# Patient Record
Sex: Female | Born: 1955
Health system: Southern US, Community
[De-identification: ages and names within clinical notes are randomized; demographics above are authoritative.]

## PROBLEM LIST (undated history)

## (undated) DIAGNOSIS — T4145XA Adverse effect of unspecified anesthetic, initial encounter: Secondary | ICD-10-CM

## (undated) DIAGNOSIS — T8859XA Other complications of anesthesia, initial encounter: Secondary | ICD-10-CM

## (undated) DIAGNOSIS — Z9283 Personal history of failed moderate sedation: Secondary | ICD-10-CM

## (undated) DIAGNOSIS — M51369 Other intervertebral disc degeneration, lumbar region without mention of lumbar back pain or lower extremity pain: Secondary | ICD-10-CM

## (undated) DIAGNOSIS — G459 Transient cerebral ischemic attack, unspecified: Secondary | ICD-10-CM

## (undated) DIAGNOSIS — R079 Chest pain, unspecified: Secondary | ICD-10-CM

## (undated) DIAGNOSIS — J189 Pneumonia, unspecified organism: Secondary | ICD-10-CM

## (undated) DIAGNOSIS — Z86010 Personal history of colonic polyps: Secondary | ICD-10-CM

## (undated) DIAGNOSIS — G4730 Sleep apnea, unspecified: Secondary | ICD-10-CM

## (undated) DIAGNOSIS — F32A Depression, unspecified: Secondary | ICD-10-CM

## (undated) DIAGNOSIS — K219 Gastro-esophageal reflux disease without esophagitis: Secondary | ICD-10-CM

## (undated) DIAGNOSIS — K589 Irritable bowel syndrome without diarrhea: Secondary | ICD-10-CM

## (undated) DIAGNOSIS — I10 Essential (primary) hypertension: Secondary | ICD-10-CM

## (undated) DIAGNOSIS — F419 Anxiety disorder, unspecified: Secondary | ICD-10-CM

## (undated) DIAGNOSIS — F319 Bipolar disorder, unspecified: Secondary | ICD-10-CM

## (undated) DIAGNOSIS — R059 Cough, unspecified: Secondary | ICD-10-CM

## (undated) DIAGNOSIS — M5126 Other intervertebral disc displacement, lumbar region: Secondary | ICD-10-CM

## (undated) DIAGNOSIS — M5136 Other intervertebral disc degeneration, lumbar region: Secondary | ICD-10-CM

## (undated) DIAGNOSIS — M1990 Unspecified osteoarthritis, unspecified site: Secondary | ICD-10-CM

## (undated) DIAGNOSIS — Z8489 Family history of other specified conditions: Secondary | ICD-10-CM

## (undated) DIAGNOSIS — R05 Cough: Secondary | ICD-10-CM

## (undated) DIAGNOSIS — E039 Hypothyroidism, unspecified: Secondary | ICD-10-CM

## (undated) DIAGNOSIS — F329 Major depressive disorder, single episode, unspecified: Secondary | ICD-10-CM

## (undated) HISTORY — PX: WRIST SURGERY: SHX841

## (undated) HISTORY — PX: MASTECTOMY, PARTIAL: SHX709

## (undated) HISTORY — DX: Gastro-esophageal reflux disease without esophagitis: K21.9

## (undated) HISTORY — PX: WISDOM TOOTH EXTRACTION: SHX21

## (undated) HISTORY — PX: CHOLECYSTECTOMY: SHX55

## (undated) HISTORY — DX: Irritable bowel syndrome, unspecified: K58.9

## (undated) HISTORY — DX: Cough: R05

## (undated) HISTORY — DX: Major depressive disorder, single episode, unspecified: F32.9

## (undated) HISTORY — DX: Cough, unspecified: R05.9

## (undated) HISTORY — PX: ESOPHAGOGASTRODUODENOSCOPY (EGD) WITH ESOPHAGEAL DILATION: SHX5812

## (undated) HISTORY — DX: Anxiety disorder, unspecified: F41.9

## (undated) HISTORY — DX: Transient cerebral ischemic attack, unspecified: G45.9

## (undated) HISTORY — DX: Personal history of colonic polyps: Z86.010

## (undated) HISTORY — DX: Chest pain, unspecified: R07.9

## (undated) HISTORY — DX: Hypothyroidism, unspecified: E03.9

## (undated) HISTORY — PX: KNEE ARTHROSCOPY: SHX127

## (undated) HISTORY — PX: LAPAROSCOPY: SHX197

## (undated) HISTORY — DX: Depression, unspecified: F32.A

## (undated) HISTORY — PX: BREAST LUMPECTOMY: SHX2

## (undated) HISTORY — PX: DILATION AND CURETTAGE OF UTERUS: SHX78

## (undated) HISTORY — DX: Personal history of failed moderate sedation: Z92.83

---

## 1997-12-31 ENCOUNTER — Emergency Department (HOSPITAL_COMMUNITY): Admission: EM | Admit: 1997-12-31 | Discharge: 1997-12-31 | Payer: Self-pay | Admitting: Emergency Medicine

## 1998-03-10 ENCOUNTER — Ambulatory Visit (HOSPITAL_BASED_OUTPATIENT_CLINIC_OR_DEPARTMENT_OTHER): Admission: RE | Admit: 1998-03-10 | Discharge: 1998-03-10 | Payer: Self-pay | Admitting: Orthopedic Surgery

## 1998-08-22 ENCOUNTER — Emergency Department (HOSPITAL_COMMUNITY): Admission: EM | Admit: 1998-08-22 | Discharge: 1998-08-22 | Payer: Self-pay | Admitting: Emergency Medicine

## 1999-05-13 ENCOUNTER — Inpatient Hospital Stay (HOSPITAL_COMMUNITY): Admission: EM | Admit: 1999-05-13 | Discharge: 1999-05-18 | Payer: Self-pay | Admitting: Gastroenterology

## 1999-05-14 ENCOUNTER — Encounter: Payer: Self-pay | Admitting: Gastroenterology

## 1999-06-02 ENCOUNTER — Ambulatory Visit (HOSPITAL_COMMUNITY): Admission: RE | Admit: 1999-06-02 | Discharge: 1999-06-02 | Payer: Self-pay | Admitting: Gastroenterology

## 1999-06-02 ENCOUNTER — Encounter: Payer: Self-pay | Admitting: Gastroenterology

## 1999-06-19 ENCOUNTER — Encounter: Payer: Self-pay | Admitting: Internal Medicine

## 1999-07-03 ENCOUNTER — Encounter: Payer: Self-pay | Admitting: Internal Medicine

## 1999-07-31 ENCOUNTER — Encounter: Payer: Self-pay | Admitting: Internal Medicine

## 1999-08-04 ENCOUNTER — Encounter: Payer: Self-pay | Admitting: Gastroenterology

## 1999-08-04 ENCOUNTER — Ambulatory Visit (HOSPITAL_COMMUNITY): Admission: RE | Admit: 1999-08-04 | Discharge: 1999-08-04 | Payer: Self-pay | Admitting: Gastroenterology

## 2000-05-03 HISTORY — PX: COLONOSCOPY: SHX174

## 2001-03-13 ENCOUNTER — Emergency Department (HOSPITAL_COMMUNITY): Admission: EM | Admit: 2001-03-13 | Discharge: 2001-03-13 | Payer: Self-pay | Admitting: Emergency Medicine

## 2002-05-03 ENCOUNTER — Encounter (INDEPENDENT_AMBULATORY_CARE_PROVIDER_SITE_OTHER): Payer: Self-pay | Admitting: *Deleted

## 2002-05-03 LAB — CONVERTED CEMR LAB

## 2002-05-06 ENCOUNTER — Emergency Department (HOSPITAL_COMMUNITY): Admission: EM | Admit: 2002-05-06 | Discharge: 2002-05-06 | Payer: Self-pay | Admitting: Emergency Medicine

## 2002-05-06 ENCOUNTER — Encounter: Payer: Self-pay | Admitting: Emergency Medicine

## 2002-05-21 ENCOUNTER — Observation Stay (HOSPITAL_COMMUNITY): Admission: EM | Admit: 2002-05-21 | Discharge: 2002-05-22 | Payer: Self-pay | Admitting: Emergency Medicine

## 2002-05-21 ENCOUNTER — Encounter (INDEPENDENT_AMBULATORY_CARE_PROVIDER_SITE_OTHER): Payer: Self-pay | Admitting: Specialist

## 2002-06-05 ENCOUNTER — Other Ambulatory Visit: Admission: RE | Admit: 2002-06-05 | Discharge: 2002-06-05 | Payer: Self-pay | Admitting: Family Medicine

## 2002-06-05 ENCOUNTER — Encounter: Admission: RE | Admit: 2002-06-05 | Discharge: 2002-06-05 | Payer: Self-pay | Admitting: Family Medicine

## 2002-06-06 ENCOUNTER — Ambulatory Visit (HOSPITAL_COMMUNITY): Admission: RE | Admit: 2002-06-06 | Discharge: 2002-06-06 | Payer: Self-pay | Admitting: Surgery

## 2002-06-06 ENCOUNTER — Encounter: Payer: Self-pay | Admitting: Surgery

## 2002-06-27 ENCOUNTER — Encounter: Payer: Self-pay | Admitting: Internal Medicine

## 2002-06-28 ENCOUNTER — Encounter: Admission: RE | Admit: 2002-06-28 | Discharge: 2002-06-28 | Payer: Self-pay | Admitting: Sports Medicine

## 2002-07-04 ENCOUNTER — Encounter: Payer: Self-pay | Admitting: Gastroenterology

## 2002-07-04 ENCOUNTER — Ambulatory Visit (HOSPITAL_COMMUNITY): Admission: RE | Admit: 2002-07-04 | Discharge: 2002-07-04 | Payer: Self-pay | Admitting: Gastroenterology

## 2002-09-27 ENCOUNTER — Encounter (INDEPENDENT_AMBULATORY_CARE_PROVIDER_SITE_OTHER): Payer: Self-pay | Admitting: Gastroenterology

## 2002-09-27 ENCOUNTER — Ambulatory Visit (HOSPITAL_COMMUNITY): Admission: RE | Admit: 2002-09-27 | Discharge: 2002-09-27 | Payer: Self-pay | Admitting: Gastroenterology

## 2002-10-05 ENCOUNTER — Emergency Department (HOSPITAL_COMMUNITY): Admission: EM | Admit: 2002-10-05 | Discharge: 2002-10-05 | Payer: Self-pay | Admitting: Emergency Medicine

## 2003-05-04 HISTORY — PX: ELBOW SURGERY: SHX618

## 2004-02-26 ENCOUNTER — Ambulatory Visit (HOSPITAL_BASED_OUTPATIENT_CLINIC_OR_DEPARTMENT_OTHER): Admission: RE | Admit: 2004-02-26 | Discharge: 2004-02-26 | Payer: Self-pay | Admitting: Orthopedic Surgery

## 2004-05-22 ENCOUNTER — Ambulatory Visit: Payer: Self-pay | Admitting: Internal Medicine

## 2004-07-07 ENCOUNTER — Ambulatory Visit: Payer: Self-pay | Admitting: Internal Medicine

## 2004-07-16 ENCOUNTER — Ambulatory Visit: Payer: Self-pay | Admitting: Internal Medicine

## 2004-07-30 ENCOUNTER — Ambulatory Visit: Payer: Self-pay | Admitting: Internal Medicine

## 2004-08-03 ENCOUNTER — Ambulatory Visit: Payer: Self-pay | Admitting: Internal Medicine

## 2004-08-10 ENCOUNTER — Ambulatory Visit: Payer: Self-pay | Admitting: Internal Medicine

## 2004-08-12 ENCOUNTER — Ambulatory Visit: Payer: Self-pay

## 2004-12-17 ENCOUNTER — Ambulatory Visit: Payer: Self-pay | Admitting: Internal Medicine

## 2005-06-23 ENCOUNTER — Ambulatory Visit: Payer: Self-pay | Admitting: Internal Medicine

## 2005-06-24 ENCOUNTER — Ambulatory Visit: Payer: Self-pay | Admitting: Internal Medicine

## 2005-06-28 ENCOUNTER — Ambulatory Visit: Payer: Self-pay

## 2005-07-15 ENCOUNTER — Ambulatory Visit: Payer: Self-pay | Admitting: Internal Medicine

## 2005-07-22 ENCOUNTER — Emergency Department (HOSPITAL_COMMUNITY): Admission: EM | Admit: 2005-07-22 | Discharge: 2005-07-22 | Payer: Self-pay | Admitting: Emergency Medicine

## 2005-07-27 ENCOUNTER — Ambulatory Visit: Payer: Self-pay | Admitting: Internal Medicine

## 2005-07-29 ENCOUNTER — Ambulatory Visit (HOSPITAL_COMMUNITY): Admission: RE | Admit: 2005-07-29 | Discharge: 2005-07-29 | Payer: Self-pay | Admitting: Emergency Medicine

## 2005-08-25 ENCOUNTER — Encounter: Admission: RE | Admit: 2005-08-25 | Discharge: 2005-08-25 | Payer: Self-pay | Admitting: *Deleted

## 2005-09-08 ENCOUNTER — Ambulatory Visit: Payer: Self-pay | Admitting: Internal Medicine

## 2005-09-13 ENCOUNTER — Emergency Department (HOSPITAL_COMMUNITY): Admission: EM | Admit: 2005-09-13 | Discharge: 2005-09-14 | Payer: Self-pay | Admitting: Emergency Medicine

## 2005-09-17 ENCOUNTER — Encounter: Admission: RE | Admit: 2005-09-17 | Discharge: 2005-09-17 | Payer: Self-pay | Admitting: *Deleted

## 2005-09-24 ENCOUNTER — Ambulatory Visit: Payer: Self-pay | Admitting: Internal Medicine

## 2005-12-22 ENCOUNTER — Ambulatory Visit: Payer: Self-pay | Admitting: Internal Medicine

## 2006-03-31 ENCOUNTER — Ambulatory Visit: Payer: Self-pay | Admitting: Internal Medicine

## 2006-03-31 LAB — CONVERTED CEMR LAB
ALT: 20 units/L (ref 0–40)
AST: 21 units/L (ref 0–37)
Albumin: 3.6 g/dL (ref 3.5–5.2)
Alkaline Phosphatase: 81 units/L (ref 39–117)
BUN: 7 mg/dL (ref 6–23)
Bilirubin, Direct: 0.1 mg/dL (ref 0.0–0.3)
Creatinine, Ser: 1 mg/dL (ref 0.4–1.2)
Free T4: 0.7 ng/dL — ABNORMAL LOW (ref 0.9–1.8)
Potassium: 3.8 meq/L (ref 3.5–5.1)
TSH: 2.78 microintl units/mL (ref 0.35–5.50)
Total Bilirubin: 0.4 mg/dL (ref 0.3–1.2)
Total Protein: 6.6 g/dL (ref 6.0–8.3)

## 2006-05-13 ENCOUNTER — Encounter: Admission: RE | Admit: 2006-05-13 | Discharge: 2006-08-11 | Payer: Self-pay | Admitting: Internal Medicine

## 2006-05-18 ENCOUNTER — Ambulatory Visit: Payer: Self-pay | Admitting: Internal Medicine

## 2006-07-01 ENCOUNTER — Encounter (INDEPENDENT_AMBULATORY_CARE_PROVIDER_SITE_OTHER): Payer: Self-pay | Admitting: *Deleted

## 2006-07-05 ENCOUNTER — Ambulatory Visit: Payer: Self-pay | Admitting: Internal Medicine

## 2006-07-07 ENCOUNTER — Emergency Department (HOSPITAL_COMMUNITY): Admission: EM | Admit: 2006-07-07 | Discharge: 2006-07-07 | Payer: Self-pay | Admitting: Emergency Medicine

## 2006-08-29 ENCOUNTER — Ambulatory Visit: Payer: Self-pay | Admitting: Internal Medicine

## 2006-08-29 LAB — CONVERTED CEMR LAB
Free T4: 0.7 ng/dL (ref 0.6–1.6)
Potassium: 4.1 meq/L (ref 3.5–5.1)
T3, Free: 2.6 pg/mL (ref 2.3–4.2)
TSH: 2.16 microintl units/mL (ref 0.35–5.50)

## 2006-08-30 ENCOUNTER — Ambulatory Visit: Payer: Self-pay | Admitting: Internal Medicine

## 2006-10-10 ENCOUNTER — Ambulatory Visit (HOSPITAL_BASED_OUTPATIENT_CLINIC_OR_DEPARTMENT_OTHER): Admission: RE | Admit: 2006-10-10 | Discharge: 2006-10-10 | Payer: Self-pay | Admitting: Internal Medicine

## 2006-10-12 ENCOUNTER — Ambulatory Visit: Payer: Self-pay | Admitting: Internal Medicine

## 2006-10-15 ENCOUNTER — Ambulatory Visit: Payer: Self-pay | Admitting: Pulmonary Disease

## 2006-10-17 LAB — CONVERTED CEMR LAB
Cholesterol: 215 mg/dL (ref 0–200)
Direct LDL: 126.5 mg/dL
Glucose, Bld: 103 mg/dL — ABNORMAL HIGH (ref 70–99)
HDL: 40.8 mg/dL (ref >39.0)
Total CHOL/HDL Ratio: 5.3
Triglycerides: 142 mg/dL (ref 0–149)
VLDL: 28 mg/dL (ref 0–40)

## 2006-10-26 ENCOUNTER — Encounter: Payer: Self-pay | Admitting: Internal Medicine

## 2006-10-27 ENCOUNTER — Ambulatory Visit: Payer: Self-pay | Admitting: Internal Medicine

## 2006-10-27 DIAGNOSIS — R358 Other polyuria: Secondary | ICD-10-CM

## 2006-10-27 DIAGNOSIS — R3589 Other polyuria: Secondary | ICD-10-CM | POA: Insufficient documentation

## 2006-10-27 DIAGNOSIS — E785 Hyperlipidemia, unspecified: Secondary | ICD-10-CM | POA: Insufficient documentation

## 2006-10-27 DIAGNOSIS — R7989 Other specified abnormal findings of blood chemistry: Secondary | ICD-10-CM | POA: Insufficient documentation

## 2006-10-27 LAB — CONVERTED CEMR LAB
Bilirubin Urine: NEGATIVE
Glucose, Urine, Semiquant: NEGATIVE
Ketones, urine, test strip: NEGATIVE
Nitrite: NEGATIVE
Protein, U semiquant: NEGATIVE
Specific Gravity, Urine: <1.005
Urobilinogen, UA: NEGATIVE
WBC Urine, dipstick: NEGATIVE
pH: 5

## 2006-10-28 LAB — CONVERTED CEMR LAB: Hgb A1c MFr Bld: 5.7 % (ref 4.6–6.0)

## 2006-10-31 ENCOUNTER — Encounter (INDEPENDENT_AMBULATORY_CARE_PROVIDER_SITE_OTHER): Payer: Self-pay | Admitting: *Deleted

## 2006-12-20 ENCOUNTER — Ambulatory Visit: Payer: Self-pay | Admitting: Internal Medicine

## 2006-12-20 DIAGNOSIS — R279 Unspecified lack of coordination: Secondary | ICD-10-CM | POA: Insufficient documentation

## 2006-12-20 LAB — CONVERTED CEMR LAB
Bilirubin Urine: NEGATIVE
Blood in Urine, dipstick: NEGATIVE
Glucose, Urine, Semiquant: NEGATIVE
Ketones, urine, test strip: NEGATIVE
Nitrite: NEGATIVE
Protein, U semiquant: NEGATIVE
Specific Gravity, Urine: 1.01
Urobilinogen, UA: NEGATIVE
WBC Urine, dipstick: NEGATIVE
pH: 5

## 2006-12-22 ENCOUNTER — Encounter (INDEPENDENT_AMBULATORY_CARE_PROVIDER_SITE_OTHER): Payer: Self-pay | Admitting: *Deleted

## 2006-12-22 LAB — CONVERTED CEMR LAB
ALT: 18 units/L (ref 0–35)
AST: 16 units/L (ref 0–37)
Albumin: 4.2 g/dL (ref 3.5–5.2)
Alkaline Phosphatase: 80 units/L (ref 39–117)
Bilirubin, Direct: 0.1 mg/dL (ref 0.0–0.3)
Total Bilirubin: 0.5 mg/dL (ref 0.3–1.2)
Total Protein: 7.3 g/dL (ref 6.0–8.3)

## 2007-08-08 ENCOUNTER — Ambulatory Visit: Payer: Self-pay | Admitting: Internal Medicine

## 2007-08-10 ENCOUNTER — Telehealth (INDEPENDENT_AMBULATORY_CARE_PROVIDER_SITE_OTHER): Payer: Self-pay | Admitting: *Deleted

## 2007-08-10 ENCOUNTER — Encounter (INDEPENDENT_AMBULATORY_CARE_PROVIDER_SITE_OTHER): Payer: Self-pay | Admitting: *Deleted

## 2007-11-19 ENCOUNTER — Emergency Department (HOSPITAL_COMMUNITY): Admission: EM | Admit: 2007-11-19 | Discharge: 2007-11-19 | Payer: Self-pay | Admitting: Emergency Medicine

## 2007-11-20 ENCOUNTER — Ambulatory Visit: Payer: Self-pay | Admitting: Internal Medicine

## 2007-11-21 ENCOUNTER — Encounter (INDEPENDENT_AMBULATORY_CARE_PROVIDER_SITE_OTHER): Payer: Self-pay | Admitting: *Deleted

## 2007-11-23 ENCOUNTER — Encounter: Payer: Self-pay | Admitting: Internal Medicine

## 2007-12-01 ENCOUNTER — Encounter: Payer: Self-pay | Admitting: Internal Medicine

## 2007-12-22 ENCOUNTER — Encounter: Payer: Self-pay | Admitting: Internal Medicine

## 2008-01-30 ENCOUNTER — Encounter: Payer: Self-pay | Admitting: Internal Medicine

## 2008-03-02 ENCOUNTER — Telehealth: Payer: Self-pay | Admitting: Family Medicine

## 2008-03-05 ENCOUNTER — Encounter (INDEPENDENT_AMBULATORY_CARE_PROVIDER_SITE_OTHER): Payer: Self-pay | Admitting: *Deleted

## 2008-03-05 ENCOUNTER — Ambulatory Visit: Payer: Self-pay | Admitting: Family Medicine

## 2008-03-06 ENCOUNTER — Encounter (INDEPENDENT_AMBULATORY_CARE_PROVIDER_SITE_OTHER): Payer: Self-pay | Admitting: *Deleted

## 2008-03-06 ENCOUNTER — Telehealth (INDEPENDENT_AMBULATORY_CARE_PROVIDER_SITE_OTHER): Payer: Self-pay | Admitting: *Deleted

## 2008-03-07 ENCOUNTER — Encounter: Payer: Self-pay | Admitting: Internal Medicine

## 2008-03-11 ENCOUNTER — Telehealth (INDEPENDENT_AMBULATORY_CARE_PROVIDER_SITE_OTHER): Payer: Self-pay | Admitting: *Deleted

## 2008-03-12 ENCOUNTER — Encounter (INDEPENDENT_AMBULATORY_CARE_PROVIDER_SITE_OTHER): Payer: Self-pay | Admitting: *Deleted

## 2008-03-14 ENCOUNTER — Ambulatory Visit: Payer: Self-pay | Admitting: Internal Medicine

## 2008-03-15 ENCOUNTER — Encounter (INDEPENDENT_AMBULATORY_CARE_PROVIDER_SITE_OTHER): Payer: Self-pay | Admitting: *Deleted

## 2008-03-15 LAB — CONVERTED CEMR LAB
BUN: 8 mg/dL (ref 6–23)
Creatinine, Ser: 1 mg/dL (ref 0.4–1.2)
Hgb A1c MFr Bld: 5.8 % (ref 4.6–6.0)
Potassium: 3.9 meq/L (ref 3.5–5.1)
TSH: 2.93 microintl units/mL (ref 0.35–5.50)

## 2008-05-15 ENCOUNTER — Encounter: Payer: Self-pay | Admitting: Internal Medicine

## 2008-06-19 ENCOUNTER — Encounter: Payer: Self-pay | Admitting: Internal Medicine

## 2008-06-27 ENCOUNTER — Encounter: Admission: RE | Admit: 2008-06-27 | Discharge: 2008-06-27 | Payer: Self-pay | Admitting: Obstetrics and Gynecology

## 2008-09-16 ENCOUNTER — Telehealth (INDEPENDENT_AMBULATORY_CARE_PROVIDER_SITE_OTHER): Payer: Self-pay | Admitting: *Deleted

## 2008-09-16 ENCOUNTER — Ambulatory Visit: Payer: Self-pay | Admitting: Family Medicine

## 2008-10-09 ENCOUNTER — Telehealth (INDEPENDENT_AMBULATORY_CARE_PROVIDER_SITE_OTHER): Payer: Self-pay | Admitting: *Deleted

## 2008-10-14 ENCOUNTER — Ambulatory Visit: Payer: Self-pay | Admitting: Internal Medicine

## 2008-10-14 DIAGNOSIS — M545 Low back pain, unspecified: Secondary | ICD-10-CM | POA: Insufficient documentation

## 2008-10-14 DIAGNOSIS — R109 Unspecified abdominal pain: Secondary | ICD-10-CM | POA: Insufficient documentation

## 2008-10-14 LAB — CONVERTED CEMR LAB
Bilirubin Urine: NEGATIVE
Blood in Urine, dipstick: NEGATIVE
Glucose, Urine, Semiquant: NEGATIVE
Ketones, urine, test strip: NEGATIVE
Nitrite: NEGATIVE
Protein, U semiquant: NEGATIVE
Specific Gravity, Urine: <1.005
Urobilinogen, UA: 0.2
pH: 7

## 2008-10-15 ENCOUNTER — Telehealth (INDEPENDENT_AMBULATORY_CARE_PROVIDER_SITE_OTHER): Payer: Self-pay | Admitting: *Deleted

## 2008-10-15 ENCOUNTER — Encounter (INDEPENDENT_AMBULATORY_CARE_PROVIDER_SITE_OTHER): Payer: Self-pay | Admitting: *Deleted

## 2008-10-16 ENCOUNTER — Telehealth (INDEPENDENT_AMBULATORY_CARE_PROVIDER_SITE_OTHER): Payer: Self-pay | Admitting: *Deleted

## 2008-10-16 ENCOUNTER — Encounter (INDEPENDENT_AMBULATORY_CARE_PROVIDER_SITE_OTHER): Payer: Self-pay | Admitting: *Deleted

## 2008-10-16 LAB — CONVERTED CEMR LAB
ALT: 64 units/L — ABNORMAL HIGH (ref 0–35)
AST: 61 units/L — ABNORMAL HIGH (ref 0–37)
Albumin: 4 g/dL (ref 3.5–5.2)
Alkaline Phosphatase: 99 units/L (ref 39–117)
Amylase: 66 units/L (ref 27–131)
Basophils Absolute: 0 10*3/uL (ref 0.0–0.1)
Basophils Relative: 0.6 % (ref 0.0–3.0)
Bilirubin, Direct: 0.1 mg/dL (ref 0.0–0.3)
Eosinophils Absolute: 0.1 10*3/uL (ref 0.0–0.7)
Eosinophils Relative: 4 % (ref 0.0–5.0)
H Pylori IgG: NEGATIVE
HCT: 40.4 % (ref 36.0–46.0)
Hemoglobin: 13.7 g/dL (ref 12.0–15.0)
Lipase: 22 units/L (ref 11.0–59.0)
Lymphocytes Relative: 32.2 % (ref 12.0–46.0)
Lymphs Abs: 1 10*3/uL (ref 0.7–4.0)
MCHC: 33.8 g/dL (ref 30.0–36.0)
MCV: 94.4 fL (ref 78.0–100.0)
Monocytes Absolute: 0.2 10*3/uL (ref 0.1–1.0)
Monocytes Relative: 5.3 % (ref 3.0–12.0)
Neutro Abs: 1.8 10*3/uL (ref 1.4–7.7)
Neutrophils Relative %: 57.9 % (ref 43.0–77.0)
Platelets: 180 10*3/uL (ref 150.0–400.0)
RBC: 4.28 M/uL (ref 3.87–5.11)
RDW: 12.8 % (ref 11.5–14.6)
Total Bilirubin: 0.5 mg/dL (ref 0.3–1.2)
Total Protein: 7.3 g/dL (ref 6.0–8.3)
WBC: 3.1 10*3/uL — ABNORMAL LOW (ref 4.5–10.5)

## 2008-10-17 ENCOUNTER — Telehealth (INDEPENDENT_AMBULATORY_CARE_PROVIDER_SITE_OTHER): Payer: Self-pay | Admitting: *Deleted

## 2008-10-17 ENCOUNTER — Ambulatory Visit: Payer: Self-pay | Admitting: Internal Medicine

## 2008-10-17 LAB — CONVERTED CEMR LAB
OCCULT 1: NEGATIVE
OCCULT 2: NEGATIVE
OCCULT 3: NEGATIVE

## 2008-10-18 ENCOUNTER — Encounter (INDEPENDENT_AMBULATORY_CARE_PROVIDER_SITE_OTHER): Payer: Self-pay | Admitting: *Deleted

## 2008-10-18 ENCOUNTER — Telehealth (INDEPENDENT_AMBULATORY_CARE_PROVIDER_SITE_OTHER): Payer: Self-pay | Admitting: *Deleted

## 2008-10-21 ENCOUNTER — Telehealth: Payer: Self-pay | Admitting: Internal Medicine

## 2008-10-22 ENCOUNTER — Ambulatory Visit: Payer: Self-pay | Admitting: Internal Medicine

## 2008-10-22 LAB — CONVERTED CEMR LAB
ALT: 35 units/L (ref 0–35)
AST: 29 units/L (ref 0–37)
Albumin: 3.8 g/dL (ref 3.5–5.2)
Alkaline Phosphatase: 88 units/L (ref 39–117)
Bilirubin, Direct: 0 mg/dL (ref 0.0–0.3)
Total Bilirubin: 0.7 mg/dL (ref 0.3–1.2)
Total Protein: 7 g/dL (ref 6.0–8.3)

## 2008-10-23 ENCOUNTER — Encounter (INDEPENDENT_AMBULATORY_CARE_PROVIDER_SITE_OTHER): Payer: Self-pay | Admitting: *Deleted

## 2008-10-24 ENCOUNTER — Telehealth: Payer: Self-pay | Admitting: Internal Medicine

## 2008-10-27 ENCOUNTER — Emergency Department (HOSPITAL_BASED_OUTPATIENT_CLINIC_OR_DEPARTMENT_OTHER): Admission: EM | Admit: 2008-10-27 | Discharge: 2008-10-27 | Payer: Self-pay | Admitting: Emergency Medicine

## 2008-10-27 ENCOUNTER — Ambulatory Visit: Payer: Self-pay | Admitting: Diagnostic Radiology

## 2008-10-28 ENCOUNTER — Ambulatory Visit: Payer: Self-pay | Admitting: Family Medicine

## 2008-10-28 DIAGNOSIS — I889 Nonspecific lymphadenitis, unspecified: Secondary | ICD-10-CM | POA: Insufficient documentation

## 2008-10-30 ENCOUNTER — Emergency Department (HOSPITAL_COMMUNITY): Admission: EM | Admit: 2008-10-30 | Discharge: 2008-10-30 | Payer: Self-pay | Admitting: Emergency Medicine

## 2008-11-05 ENCOUNTER — Encounter: Payer: Self-pay | Admitting: Internal Medicine

## 2008-11-07 ENCOUNTER — Telehealth (INDEPENDENT_AMBULATORY_CARE_PROVIDER_SITE_OTHER): Payer: Self-pay | Admitting: *Deleted

## 2008-11-11 ENCOUNTER — Telehealth (INDEPENDENT_AMBULATORY_CARE_PROVIDER_SITE_OTHER): Payer: Self-pay | Admitting: *Deleted

## 2008-11-11 ENCOUNTER — Telehealth: Payer: Self-pay | Admitting: Internal Medicine

## 2008-11-19 ENCOUNTER — Encounter: Admission: RE | Admit: 2008-11-19 | Discharge: 2008-12-09 | Payer: Self-pay | Admitting: Internal Medicine

## 2008-11-20 ENCOUNTER — Encounter: Payer: Self-pay | Admitting: Internal Medicine

## 2008-11-26 ENCOUNTER — Telehealth: Payer: Self-pay | Admitting: Internal Medicine

## 2008-11-26 ENCOUNTER — Encounter: Payer: Self-pay | Admitting: Internal Medicine

## 2008-12-04 ENCOUNTER — Encounter: Payer: Self-pay | Admitting: Internal Medicine

## 2008-12-10 ENCOUNTER — Encounter: Payer: Self-pay | Admitting: Internal Medicine

## 2009-01-13 ENCOUNTER — Ambulatory Visit (HOSPITAL_BASED_OUTPATIENT_CLINIC_OR_DEPARTMENT_OTHER): Admission: RE | Admit: 2009-01-13 | Discharge: 2009-01-14 | Payer: Self-pay | Admitting: Urology

## 2009-01-13 HISTORY — PX: CYSTOCELE REPAIR: SHX163

## 2009-01-31 ENCOUNTER — Ambulatory Visit: Payer: Self-pay | Admitting: Internal Medicine

## 2009-02-07 ENCOUNTER — Encounter: Admission: RE | Admit: 2009-02-07 | Discharge: 2009-02-07 | Payer: Self-pay | Admitting: Obstetrics and Gynecology

## 2009-02-10 ENCOUNTER — Ambulatory Visit: Payer: Self-pay | Admitting: Internal Medicine

## 2009-02-10 DIAGNOSIS — Z9283 Personal history of failed moderate sedation: Secondary | ICD-10-CM | POA: Insufficient documentation

## 2009-02-25 ENCOUNTER — Encounter (INDEPENDENT_AMBULATORY_CARE_PROVIDER_SITE_OTHER): Payer: Self-pay | Admitting: General Surgery

## 2009-02-25 ENCOUNTER — Ambulatory Visit (HOSPITAL_BASED_OUTPATIENT_CLINIC_OR_DEPARTMENT_OTHER): Admission: RE | Admit: 2009-02-25 | Discharge: 2009-02-25 | Payer: Self-pay | Admitting: General Surgery

## 2009-03-05 ENCOUNTER — Emergency Department (HOSPITAL_BASED_OUTPATIENT_CLINIC_OR_DEPARTMENT_OTHER): Admission: EM | Admit: 2009-03-05 | Discharge: 2009-03-05 | Payer: Self-pay | Admitting: Emergency Medicine

## 2009-03-17 ENCOUNTER — Telehealth: Payer: Self-pay | Admitting: Internal Medicine

## 2009-03-20 ENCOUNTER — Encounter: Payer: Self-pay | Admitting: Internal Medicine

## 2009-04-10 ENCOUNTER — Encounter: Payer: Self-pay | Admitting: Internal Medicine

## 2009-05-20 ENCOUNTER — Ambulatory Visit: Payer: Self-pay | Admitting: Internal Medicine

## 2009-05-20 DIAGNOSIS — E876 Hypokalemia: Secondary | ICD-10-CM | POA: Insufficient documentation

## 2009-05-20 DIAGNOSIS — R5383 Other fatigue: Secondary | ICD-10-CM

## 2009-05-20 DIAGNOSIS — R5381 Other malaise: Secondary | ICD-10-CM | POA: Insufficient documentation

## 2009-05-21 ENCOUNTER — Encounter (INDEPENDENT_AMBULATORY_CARE_PROVIDER_SITE_OTHER): Payer: Self-pay | Admitting: *Deleted

## 2009-05-21 LAB — CONVERTED CEMR LAB
BUN: 12 mg/dL (ref 6–23)
Basophils Absolute: 0 10*3/uL (ref 0.0–0.1)
Basophils Relative: 0.8 % (ref 0.0–3.0)
Creatinine, Ser: 1.1 mg/dL (ref 0.4–1.2)
Eosinophils Absolute: 0.2 10*3/uL (ref 0.0–0.7)
Eosinophils Relative: 3.8 % (ref 0.0–5.0)
Free T4: 0.8 ng/dL (ref 0.6–1.6)
HCT: 40.4 % (ref 36.0–46.0)
Hemoglobin: 13.4 g/dL (ref 12.0–15.0)
Lymphocytes Relative: 35.5 % (ref 12.0–46.0)
Lymphs Abs: 1.7 10*3/uL (ref 0.7–4.0)
MCHC: 33.1 g/dL (ref 30.0–36.0)
MCV: 95.1 fL (ref 78.0–100.0)
Monocytes Absolute: 0.4 10*3/uL (ref 0.1–1.0)
Monocytes Relative: 7.5 % (ref 3.0–12.0)
Neutro Abs: 2.4 10*3/uL (ref 1.4–7.7)
Neutrophils Relative %: 52.4 % (ref 43.0–77.0)
Platelets: 197 10*3/uL (ref 150.0–400.0)
Potassium: 4.7 meq/L (ref 3.5–5.1)
RBC: 4.25 M/uL (ref 3.87–5.11)
RDW: 12.7 % (ref 11.5–14.6)
T3, Free: 3.1 pg/mL (ref 2.3–4.2)
TSH: 2.8 microintl units/mL (ref 0.35–5.50)
WBC: 4.7 10*3/uL (ref 4.5–10.5)

## 2009-06-02 ENCOUNTER — Telehealth: Payer: Self-pay | Admitting: Internal Medicine

## 2009-06-17 ENCOUNTER — Ambulatory Visit: Payer: Self-pay | Admitting: Internal Medicine

## 2009-06-18 ENCOUNTER — Telehealth (INDEPENDENT_AMBULATORY_CARE_PROVIDER_SITE_OTHER): Payer: Self-pay | Admitting: *Deleted

## 2009-06-21 ENCOUNTER — Emergency Department (HOSPITAL_COMMUNITY): Admission: EM | Admit: 2009-06-21 | Discharge: 2009-06-21 | Payer: Self-pay | Admitting: Emergency Medicine

## 2009-10-23 ENCOUNTER — Ambulatory Visit: Payer: Self-pay | Admitting: Internal Medicine

## 2009-10-23 DIAGNOSIS — F319 Bipolar disorder, unspecified: Secondary | ICD-10-CM | POA: Insufficient documentation

## 2009-10-24 LAB — CONVERTED CEMR LAB
ALT: 18 units/L (ref 0–35)
AST: 20 units/L (ref 0–37)
Albumin: 4.3 g/dL (ref 3.5–5.2)
Alkaline Phosphatase: 89 units/L (ref 39–117)
BUN: 15 mg/dL (ref 6–23)
Basophils Absolute: 0 10*3/uL (ref 0.0–0.1)
Basophils Relative: 0.5 % (ref 0.0–3.0)
Bilirubin, Direct: 0.1 mg/dL (ref 0.0–0.3)
CO2: 29 meq/L (ref 19–32)
Calcium: 9.4 mg/dL (ref 8.4–10.5)
Chloride: 104 meq/L (ref 96–112)
Cholesterol: 197 mg/dL (ref 0–200)
Creatinine, Ser: 1.1 mg/dL (ref 0.4–1.2)
Eosinophils Absolute: 0.1 10*3/uL (ref 0.0–0.7)
Eosinophils Relative: 2.2 % (ref 0.0–5.0)
GFR calc non Af Amer: 55.5 mL/min (ref >60)
Glucose, Bld: 80 mg/dL (ref 70–99)
HCT: 39.7 % (ref 36.0–46.0)
HDL: 55.9 mg/dL (ref >39.00)
Hemoglobin: 13.6 g/dL (ref 12.0–15.0)
Hgb A1c MFr Bld: 5.7 % (ref 4.6–6.5)
LDL Cholesterol: 119 mg/dL — ABNORMAL HIGH (ref 0–99)
Lymphocytes Relative: 28.5 % (ref 12.0–46.0)
Lymphs Abs: 1.6 10*3/uL (ref 0.7–4.0)
MCHC: 34.4 g/dL (ref 30.0–36.0)
MCV: 94.1 fL (ref 78.0–100.0)
Monocytes Absolute: 0.5 10*3/uL (ref 0.1–1.0)
Monocytes Relative: 8.1 % (ref 3.0–12.0)
Neutro Abs: 3.4 10*3/uL (ref 1.4–7.7)
Neutrophils Relative %: 60.7 % (ref 43.0–77.0)
Platelets: 187 10*3/uL (ref 150.0–400.0)
Potassium: 4.5 meq/L (ref 3.5–5.1)
RBC: 4.22 M/uL (ref 3.87–5.11)
RDW: 14 % (ref 11.5–14.6)
Sodium: 143 meq/L (ref 135–145)
TSH: 2.69 microintl units/mL (ref 0.35–5.50)
Total Bilirubin: 0.5 mg/dL (ref 0.3–1.2)
Total CHOL/HDL Ratio: 4
Total Protein: 7.5 g/dL (ref 6.0–8.3)
Triglycerides: 112 mg/dL (ref 0.0–149.0)
VLDL: 22.4 mg/dL (ref 0.0–40.0)
WBC: 5.7 10*3/uL (ref 4.5–10.5)

## 2009-11-24 ENCOUNTER — Ambulatory Visit: Payer: Self-pay | Admitting: Internal Medicine

## 2009-11-24 DIAGNOSIS — R198 Other specified symptoms and signs involving the digestive system and abdomen: Secondary | ICD-10-CM | POA: Insufficient documentation

## 2009-11-24 LAB — CONVERTED CEMR LAB
Cholesterol, target level: 200 mg/dL
HDL goal, serum: 40 mg/dL
LDL Goal: 160 mg/dL

## 2009-11-25 ENCOUNTER — Telehealth (INDEPENDENT_AMBULATORY_CARE_PROVIDER_SITE_OTHER): Payer: Self-pay | Admitting: *Deleted

## 2009-11-25 ENCOUNTER — Emergency Department (HOSPITAL_COMMUNITY): Admission: EM | Admit: 2009-11-25 | Discharge: 2009-11-25 | Payer: Self-pay | Admitting: Emergency Medicine

## 2009-11-27 LAB — CONVERTED CEMR LAB
Folate: 3.9 ng/mL
Iron: 84 ug/dL (ref 42–145)
Saturation Ratios: 29.3 % (ref 20.0–50.0)
Sed Rate: 19 mm/hr (ref 0–22)
Transferrin: 204.8 mg/dL — ABNORMAL LOW (ref 212.0–360.0)
Vitamin B-12: 183 pg/mL — ABNORMAL LOW (ref 211–911)

## 2009-12-03 ENCOUNTER — Ambulatory Visit: Payer: Self-pay | Admitting: Diagnostic Radiology

## 2009-12-03 ENCOUNTER — Emergency Department (HOSPITAL_BASED_OUTPATIENT_CLINIC_OR_DEPARTMENT_OTHER): Admission: EM | Admit: 2009-12-03 | Discharge: 2009-12-03 | Payer: Self-pay | Admitting: Emergency Medicine

## 2010-02-17 ENCOUNTER — Ambulatory Visit: Payer: Self-pay | Admitting: Internal Medicine

## 2010-02-17 DIAGNOSIS — R946 Abnormal results of thyroid function studies: Secondary | ICD-10-CM | POA: Insufficient documentation

## 2010-02-17 DIAGNOSIS — E538 Deficiency of other specified B group vitamins: Secondary | ICD-10-CM | POA: Insufficient documentation

## 2010-02-23 LAB — CONVERTED CEMR LAB
Potassium: 4.4 meq/L (ref 3.5–5.1)
TSH: 1.52 microintl units/mL (ref 0.35–5.50)

## 2010-02-24 ENCOUNTER — Ambulatory Visit: Payer: Self-pay | Admitting: Internal Medicine

## 2010-04-11 LAB — HM MAMMOGRAPHY

## 2010-04-11 LAB — HM PAP SMEAR

## 2010-05-14 ENCOUNTER — Ambulatory Visit
Admission: RE | Admit: 2010-05-14 | Discharge: 2010-05-14 | Payer: Self-pay | Source: Home / Self Care | Attending: Internal Medicine | Admitting: Internal Medicine

## 2010-05-14 ENCOUNTER — Encounter (INDEPENDENT_AMBULATORY_CARE_PROVIDER_SITE_OTHER): Payer: Self-pay | Admitting: *Deleted

## 2010-05-14 ENCOUNTER — Other Ambulatory Visit: Payer: Self-pay | Admitting: Internal Medicine

## 2010-05-14 DIAGNOSIS — R443 Hallucinations, unspecified: Secondary | ICD-10-CM | POA: Insufficient documentation

## 2010-05-14 DIAGNOSIS — R51 Headache: Secondary | ICD-10-CM | POA: Insufficient documentation

## 2010-05-14 DIAGNOSIS — R519 Headache, unspecified: Secondary | ICD-10-CM | POA: Insufficient documentation

## 2010-05-15 LAB — BASIC METABOLIC PANEL
BUN: 11 mg/dL (ref 6–23)
CO2: 29 mEq/L (ref 19–32)
Calcium: 9.2 mg/dL (ref 8.4–10.5)
Chloride: 103 mEq/L (ref 96–112)
Creatinine, Ser: 1.2 mg/dL (ref 0.4–1.2)
GFR: 50.09 mL/min — ABNORMAL LOW (ref >60.00)
Glucose, Bld: 86 mg/dL (ref 70–99)
Potassium: 3.6 mEq/L (ref 3.5–5.1)
Sodium: 139 mEq/L (ref 135–145)

## 2010-05-15 LAB — CBC WITH DIFFERENTIAL/PLATELET
Basophils Absolute: 0 10*3/uL (ref 0.0–0.1)
Basophils Relative: 0.2 % (ref 0.0–3.0)
Eosinophils Absolute: 0.1 10*3/uL (ref 0.0–0.7)
Eosinophils Relative: 3.8 % (ref 0.0–5.0)
HCT: 41.6 % (ref 36.0–46.0)
Hemoglobin: 13.8 g/dL (ref 12.0–15.0)
Lymphocytes Relative: 44.7 % (ref 12.0–46.0)
Lymphs Abs: 1.7 10*3/uL (ref 0.7–4.0)
MCHC: 33.2 g/dL (ref 30.0–36.0)
MCV: 94.4 fl (ref 78.0–100.0)
Monocytes Absolute: 0.2 10*3/uL (ref 0.1–1.0)
Monocytes Relative: 6 % (ref 3.0–12.0)
Neutro Abs: 1.8 10*3/uL (ref 1.4–7.7)
Neutrophils Relative %: 45.3 % (ref 43.0–77.0)
Platelets: 176 10*3/uL (ref 150.0–400.0)
RBC: 4.41 Mil/uL (ref 3.87–5.11)
RDW: 13.7 % (ref 11.5–14.6)
WBC: 3.9 10*3/uL — ABNORMAL LOW (ref 4.5–10.5)

## 2010-05-15 LAB — HEPATIC FUNCTION PANEL
ALT: 24 U/L (ref 0–35)
AST: 19 U/L (ref 0–37)
Albumin: 4.1 g/dL (ref 3.5–5.2)
Alkaline Phosphatase: 91 U/L (ref 39–117)
Bilirubin, Direct: 0.1 mg/dL (ref 0.0–0.3)
Total Bilirubin: 0.6 mg/dL (ref 0.3–1.2)
Total Protein: 7.2 g/dL (ref 6.0–8.3)

## 2010-05-15 LAB — SEDIMENTATION RATE: Sed Rate: 14 mm/hr (ref 0–22)

## 2010-05-15 LAB — MAGNESIUM: Magnesium: 1.9 mg/dL (ref 1.5–2.5)

## 2010-05-15 LAB — TSH: TSH: 0.76 u[IU]/mL (ref 0.35–5.50)

## 2010-05-15 LAB — VITAMIN B12: Vitamin B-12: 526 pg/mL (ref 211–911)

## 2010-05-21 ENCOUNTER — Telehealth: Payer: Self-pay | Admitting: Internal Medicine

## 2010-05-25 ENCOUNTER — Encounter: Payer: Self-pay | Admitting: Obstetrics and Gynecology

## 2010-05-25 ENCOUNTER — Telehealth: Payer: Self-pay | Admitting: Internal Medicine

## 2010-06-04 NOTE — Letter (Signed)
Summary: Out of Work  [facility] [employer]  [address] [place], [place] [address]   Phone: [phone]  Fax: [phone]    March 05, 2008   Employee:  Sarah Salazar    To Whom It May Concern:   For Medical reasons, please excuse the above named employee from work for the following dates:  Start:   March 01, 2008  End:   March 05, 2008, may return to work on March 06, 2008  If you need additional information, please feel free to contact our office.         Sincerely,    Loreen Freud, DO

## 2010-06-04 NOTE — Assessment & Plan Note (Signed)
Summary: FOR WEIGHT LOSS//PH   Vital Signs:  Patient profile:   55 year old female Weight:      182.2 pounds Pulse rate:   72 / minute Resp:     15 per minute BP sitting:   126 / 78  (left arm) Cuff size:   large  Vitals Entered By: Shonna Chock (May 20, 2009 8:00 AM) CC: 1.Weight concerns  2.) Refill: Spironolacton,Kdur,Ranitidine, and Hycosamine Comments REVIEWED MED LIST, PATIENT AGREED DOSE AND INSTRUCTION CORRECT    Primary Care Provider:  Marga Melnick, MD   CC:  1.Weight concerns  2.) Refill: Spironolacton, Kdur, Ranitidine, and and Hycosamine.  History of Present Illness: [employer]  providing frozen  meals; she maintains she is restricting hyperglycemic carbs . Walking in building 3X/day. 12 # weight gain since 01/2009.  Allergies: 1)  ! Amoxicillin 2)  ! * Fentanyl 3)  ! Nubain 4)  ! Effexor 5)  ! Percodan 6)  ! Percocet 7)  ! Hydrocodone 8)  ! Sulfa 9)  ! Versed 10)  ! * Tessalon Perls 11)  ! * Steri Strips  Review of Systems General:  Complains of fatigue. ENT:  Denies difficulty swallowing and hoarseness; see GI evaluation. CV:  Denies palpitations. GI:  Complains of constipation and diarrhea; Alternating ; Endo & colonoscopy planned. Derm:  Denies changes in nail beds and dryness; hair breaking off. Neuro:  Denies numbness and tingling. Endo:  Complains of cold intolerance; denies heat intolerance.  Physical Exam  General:  well-nourished,in no acute distress; alert,appropriate and cooperative throughout examination Eyes:  No corneal or conjunctival inflammation noted.No lid lag.Perrla. Neck:  No deformities, masses, or tenderness noted. Heart:  Normal rate and regular rhythm. S1 and S2 normal without gallop, murmur, click, rub. S4 with slight slurring Neurologic:  DTRs symmetrical and normal.  No tremor Skin:  Intact without suspicious lesions or rashes Psych:  memory intact for recent and remote, normally interactive, and not  depressed appearing.     Impression & Recommendations:  Problem # 1:  FATIGUE (ICD-780.79)  Orders: Venipuncture (45409) TLB-CBC Platelet - w/Differential (85025-CBCD) TLB-TSH (Thyroid Stimulating Hormone) (84443-TSH) TLB-T4 (Thyrox), Free [phone]) TLB-T3, Free (Triiodothyronine) (84481-T3FREE) TLB-Creatinine, Blood (82565-CREA) TLB-Potassium (K+) (84132-K) TLB-BUN (Urea Nitrogen) (84520-BUN)  Problem # 2:  HYPOKALEMIA (ICD-276.8)  PMH of  Orders: Venipuncture (95621) TLB-Creatinine, Blood (82565-CREA) TLB-Potassium (K+) (84132-K) TLB-BUN (Urea Nitrogen) (84520-BUN)  Complete Medication List: 1)  Klor-con M20 20 Meq Tbcr (Potassium chloride crys cr) .... 2 tabs m,w,f,sun and 3 tabs t,th,sat 2)  Lamictal 150 Mg Tabs (Lamotrigine) .... 2 tabs qhs 3)  Wellbutrin Xl 300 Mg Tb24 (Bupropion hcl) .Marland Kitchen.. 1 by mouth qam 4)  Vistaril 50 Mg Caps (Hydroxyzine pamoate) .Marland Kitchen.. 1-2 tabs tid 5)  Spironolactone 25 Mg Tabs (Spironolactone) .Marland Kitchen.. 1 once daily as needed edema 6)  Ibuprofen 800 Mg Tabs (Ibuprofen) .Marland Kitchen.. 1 tab by mouth q8 hours 7)  Hyoscyamine Sulfate 0.125 Mg Subl (Hyoscyamine sulfate) .... Dissolve 1-2 every 4 hours as needed 8)  Ranitidine Hcl 150 Mg Tabs (Ranitidine hcl) .Marland Kitchen.. 1 two times a day 9)  Propoxyphene N-apap 100-650 Mg Tabs (Propoxyphene n-apap) .Marland Kitchen.. 1 -2 q 6 hrs prn 10)  Darvocet-n 100 100-650 Mg Tabs (Propoxyphene n-apap) .... Take one tablet every 4-6 hours as needed for pain 11)  Miralax Powd (Polyethylene glycol 3350) .... Take 1 dose by mouth two times a day 12)  Cvs Stool Softener 100 Mg Caps (Docusate sodium) .Marland Kitchen.. 1 qd 13)  Dulcolax 5  Mg Tbec (Bisacodyl) .... Take 1-2 tablets by mouth as needed for constipation  Patient Instructions: 1)  Consume LESS THAN 25 grams of sugar / day from LABELED foods & drinks. 2)  Check your Blood Pressure regularly. If it is above:135/85 ON AVERAGE  you should make an appointment.

## 2010-06-04 NOTE — Progress Notes (Signed)
Summary: Refill Request  Phone Note Refill Request Message from:  Patient  Refills Requested: Medication #1:  HYOSCYAMINE SULFATE 0.125 MG SUBL dissolve 1-2 every 4 hours as needed [facility] Requested: Electronic Initial call taken by: Shonna Chock,  June 02, 2009 5:04 PM  Follow-up for Phone Call        Dr.Noelly Lasseigne we never rx'ed this med, patient asked for a refill on this at her last OV and it was never filled please ok and indicate dispense number Follow-up by: Shonna Chock,  June 02, 2009 5:06 PM    Prescriptions: HYOSCYAMINE SULFATE 0.125 MG SUBL (HYOSCYAMINE SULFATE) dissolve 1-2 every 4 hours as needed  #30 x 2   Entered and Authorized by:   Marga Melnick MD   Signed by:   Marga Melnick MD on 06/02/2009   Method used:   Faxed to ...       [facility] [address] [id number]* (retail)       [address]       [place], [place]  [address]       Ph: [phone]       Fax: [phone]   RxID:   [id number]

## 2010-06-04 NOTE — Progress Notes (Signed)
Summary: Back Concerns  Phone Note Call from Patient Call back at Home Phone [phone]   Caller: Patient Summary of Call: MESSAGE LEFT ON VOICEMAIL: PATIENT STILL WITH BACK PAIN, PATIENT WOULD LIKE TO KNOW WHAT TO DO SINCE SHE IS LIMITED TO MEDS THAT SHE CAN TAKE DUE TO ELEVATED LFT'S.   DR.HOPPER PLEASE FUTHER ADVISE.Sarah Salazar  October 21, 2008 3:27 PM   Follow-up for Phone Call        refer to PT Follow-up by: Marga Melnick MD,  October 21, 2008 3:58 PM

## 2010-06-04 NOTE — Progress Notes (Signed)
Summary: ? about headaches  Phone Note Call from Patient Call back at Home Phone [phone]   Caller: Patient Reason for Call: Talk to Doctor Summary of Call: Patient called and LM on the triage VM stating that she had seen Dr. Alwyn Ren yesterday. He had asked her if she had woken up with any headaches and she had told him no. But upon talking to her boyfriend about it he had reminded her that she had woken up with severe headaches about 2-3 weeks ago over the weekend of the 4th while in [place] with him. She wanted to know if this would make any difference. Please adivse.  Initial call taken by: Harold Barban,  November 25, 2009 1:03 PM  Follow-up for Phone Call        Per Hopp: No, but keep headache diary. Concern is any frequent am headaches upon awakening.  Follow-up by: Harold Barban,  November 25, 2009 3:47 PM  Additional Follow-up for Phone Call Additional follow up Details #1::        Left detailed message with information and our call back number for anymore questions or concerns.  Additional Follow-up by: Harold Barban,  November 25, 2009 3:47 PM

## 2010-06-04 NOTE — Progress Notes (Signed)
Summary: ONGOING PAIN  Phone Note Call from Patient Call back at Home Phone [phone]   Caller: Patient Summary of Call: PATIENT CALLED AND IS VERY UPSET AND FEELS OUT OF OPTIONS. PATIENT SAID IT SEEMS AS IF NOBODY WANTS TO HELP HER. PATIENT WITH PAIN . Marland Kitchen  Marland KitchenTAKING HYDROCODONE AND DARVOCET WHICH ONLY GIVES TEMPORARY RELIEF. PATIENT WAS REFERRED FOR PT FOR BACK PAIN. . .WAS TOLD AN ORDER/PLAN OF ACTION WAS SENT TO [place] WITH NO REPLY FOR HER TO GET ADDITIONAL THERAPY ON [facility]. PATIENT IS HURTHING ALL OVER AND WOULD LIKE TO KNOW WHAT TO DO. PATIENT NOT SCHEDULED TO SEE Ignacia Marvel AUG [phone]  Shonna Chock  November 26, 2008 12:13 PM   Follow-up for Phone Call        I reviewed Dr Blenda Bridegroom 11/08/08 OV; he recommended Physical Therapy . This was originally scheduled for 6/24 but cancelled by [facility]. This can be rescheduled if she desires Follow-up by: Marga Melnick MD,  November 26, 2008 1:24 PM  Additional Follow-up for Phone Call Additional follow up Details #1::        I HAVE RE-REFERRED PT BACK TO [facility].  THEY WILL CONTACT PT TO SET UP AN INITIAL APPOINTMENT. Additional Follow-up by: Magdalen Spatz [facility],  November 26, 2008 1:46 PM

## 2010-06-04 NOTE — Assessment & Plan Note (Signed)
Summary: hospital follow up   Vital Signs:  Patient profile:   55 year old female Height:      64 inches Weight:      166.50 pounds BMI:     28.68 Temp:     97.9 degrees F oral Pulse rate:   74 / minute Resp:     14 per minute BP sitting:   130 / 80  (left arm)  Vitals Entered By: [name] (October 28, 2008 11:20 AM) CC: hospital follow up Is Patient Diabetic? No Pain Assessment Patient in pain? no       Have you ever been in a relationship where you felt threatened, hurt or afraid?No   Does patient need assistance? Functional Status Self care Ambulation Normal   Primary Care Radhika Dershem:  Laury Axon  CC:  hospital follow up.  History of Present Illness: Constant dull "toothache" type pain @ center of LS spine ; no radiation. Some response to pain meds 10/26/08. Exacerbation 6/27 after 3 hrs of [place] in  [place]. Position change of no benefit. She feels as if constipated , but stools loose to watery X3, last  6/26. Seen in ER 6/27 ,record reviewed:HCT 39.9;GFR 48;mild lymphadenitis on LS spine films. [place]  Allergies: 1)  ! Amoxicillin 2)  ! * Fentanyl 3)  ! Nubain 4)  ! Effexor 5)  ! Percodan 6)  ! Percocet 7)  ! Hydrocodone 8)  ! Sulfa 9)  ! Versed 10)  ! Sondra Come  Past History:  Past Surgical History: Cholecystectomy - 06/13/2002, D&C x 3 , knee surgery x 2 -, l. wrist surgery x 3 -, laparoscopy - 06/03/1990, lumpectomy l. breast - 06/03/1978; Endo X2 : neg; Colonoscopy negative X 2  Review of Systems General:  Denies chills, fever, and sweats. GI:  See HPI; Complains of change in bowel habits; denies bloody stools and dark tarry stools. GU:  Denies discharge, dysuria, hematuria, and incontinence.  Physical Exam  General:  well-nourished,in no acute distress; alert,appropriate and cooperative throughout examination Abdomen:  Bowel sounds positive,abdomen soft but mildly tender  mid abdomen  without masses, organomegaly or  hernias noted. Extremities:  No clubbing, cyanosis, edema, or deformity noted with normal full range of motion of all joints.   Neg SLR to 90 degrees; sat up w/o help Neurologic:  strength normal in lower  extremities, gait normal, and DTRs symmetrical and normal.   Skin:  Intact without suspicious lesions or rashes Cervical Nodes:  No lymphadenopathy noted Axillary Nodes:  No palpable lymphadenopathy   Impression & Recommendations:  Problem # 1:  LOW BACK PAIN, ACUTE (ICD-724.2)  Her updated medication list for this problem includes:    Ibuprofen 800 Mg Tabs (Ibuprofen) .Marland Kitchen... 1 tab by mouth q8 hours    Propoxyphene N-apap 100-650 Mg Tabs (Propoxyphene n-apap) .Marland Kitchen... 1 -2 q 6 hrs prn    Darvocet-n 100 100-650 Mg Tabs (Propoxyphene n-apap) .Marland Kitchen... Take one tablet every 4-6 hours as needed for pain  Problem # 2:  LYMPHADENITIS (ICD-289.3)  On LS spine films  Orders: T-Culture, Giardia / Cryptosporidium (09811-91478) T-Culture, C-Diff Toxin A/B (29562-13086) T-Culture, Stool (87045/[id number])  Problem # 3:  DIARRHEA (ICD-787.91)  Orders: T-Culture, Giardia / Cryptosporidium (57846-96295) T-Culture, C-Diff Toxin A/B (28413-24401) T-Culture, Stool (87045/[id number])  Complete Medication List: 1)  Klor-con M20 20 Meq Tbcr (Potassium chloride crys cr) .... 2 tabs m,w,f,sun and 3 tabs t,th,sat 2)  Lamictal 150 Mg Tabs (Lamotrigine) .... 2 tabs qhs  3)  Wellbutrin Xl 300 Mg Tb24 (Bupropion hcl) .Marland Kitchen.. 1 by mouth qam 4)  Vistaril 50 Mg Caps (Hydroxyzine pamoate) .Marland Kitchen.. 1-2 tabs tid 5)  Spironolactone 25 Mg Tabs (Spironolactone) .Marland Kitchen.. 1 once daily as needed edema 6)  Ibuprofen 800 Mg Tabs (Ibuprofen) .Marland Kitchen.. 1 tab by mouth q8 hours 7)  Hyoscyamine Sulfate 0.125 Mg Subl (Hyoscyamine sulfate) .Marland Kitchen.. 1 sl every 4 hours as needed 8)  Ranitidine Hcl 150 Mg Tabs (Ranitidine hcl) .Marland Kitchen.. 1 two times a day 9)  Nitrofurantoin Monohyd Macro 100 Mg Caps (Nitrofurantoin monohyd macro) .Marland Kitchen.. 1 two times a day  with  8 oz water 10)  Propoxyphene N-apap 100-650 Mg Tabs (Propoxyphene n-apap) .Marland Kitchen.. 1 -2 q 6 hrs prn 11)  Darvocet-n 100 100-650 Mg Tabs (Propoxyphene n-apap) .... Take one tablet every 4-6 hours as needed for pain 12)  Metronidazole 250 Mg Tabs (Metronidazole) .Marland Kitchen.. 1 three times a day  Patient Instructions: 1)  Align 1 once daily until bowels normal. Prescriptions: METRONIDAZOLE 250 MG TABS (METRONIDAZOLE) 1 three times a day  #21 x 0   Entered and Authorized by:   Marga Melnick MD   Signed by:   Marga Melnick MD on 10/28/2008   Method used:   Print then Give to Patient   RxID:   [id number]

## 2010-06-04 NOTE — Letter (Signed)
Summary: [facility]  [facility]   Imported By: Lanelle Bal 11/12/2008 08:48:50  _____________________________________________________________________  External Attachment:    Type:   Image     Comment:   External Document

## 2010-06-04 NOTE — Progress Notes (Signed)
Summary: addl labs-dr hopper  Phone Note Call from Patient   Summary of Call: patient called bac to schedule labs  BUN,creat,& K+ (276.8)  -(scheduled 111209)she wanted to know if  --a1c& thyroid could be added Initial call taken by: Okey Regal [place],  March 11, 2008 1:58 PM  Follow-up for Phone Call        790.6 A1C, TSH

## 2010-06-04 NOTE — Progress Notes (Signed)
Summary: ****REFILL REQUEST**** hycosamine//hop see please  Phone Note Refill Request Call back at Work Phone [phone]   pt left message on machine that she needs refill on med pt [facility]. left message to call  office to verify which med she need refill on...........Marland KitchenFelecia Deloach CMA  October 09, 2008 11:34 AM  Initial call taken by: Kandice Hams,  October 10, 2008 9:07 AM  Follow-up for Phone Call        MESSAGE TAKEN OFF FELICIA'S VOICEMAIL TODAY, PATIENT WOULD LIKE A REFILL ON HYCOSAMINE SENT TO [facility]. PATIENT WILL BE AVALIABLE UNTIL 10:30AM TODAY IF ANY QUESTIONS Follow-up by: Shonna Chock,  October 10, 2008 8:34 AM  Additional Follow-up for Phone Call Additional follow up Details #1::        pt called back she said she takes this med every so often HYOSCYAMINE 0.125MG  as needed  pt says the kind you put under hyour tongue.I looked in pt chart med was given back in 2007 last  signs are starting back stomach contractions would lijke a rx [facility] [address] Please call and leave a message either way if this is going to be done Additional Follow-up by: Kandice Hams,  October 10, 2008 9:06 AM    Additional Follow-up for Phone Call Additional follow up Details #2::    OK # 30 , 1 SL q 4 hrs as needed  Follow-up by: Marga Melnick MD,  October 10, 2008 12:54 PM  Additional Follow-up for Phone Call Additional follow up Details #3:: Details for Additional Follow-up Action Taken: rx faxed and pt aware .Kandice Hams  October 10, 2008 3:06 PM  Additional Follow-up by: Kandice Hams,  October 10, 2008 3:06 PM  New/Updated Medications: HYOSCYAMINE SULFATE 0.125 MG SUBL (HYOSCYAMINE SULFATE) 1 sl every 4 hours as needed   Prescriptions: HYOSCYAMINE SULFATE 0.125 MG SUBL (HYOSCYAMINE SULFATE) 1 sl every 4 hours as needed  #30 x 0   Entered by:   Kandice Hams   Authorized by:   Marga Melnick MD   Signed by:   Kandice Hams on 10/10/2008   Method used:   Faxed to ...  [facility] [address] [id number]* (retail)       [address]       [place], [place]  [address]       Ph: [phone]       Fax: [phone]   RxID:   [id number]

## 2010-06-04 NOTE — Progress Notes (Signed)
Summary: klorcon refill  Phone Note Refill Request Message from:  Pharmacy [facility] [address]  Refills Requested: Medication #1:  KLOR-CON M20 20 MEQ TBCR 2 tabs m Initial call taken by: Kandice Hams,  Sep 16, 2008 11:01 AM      Prescriptions: KLOR-CON M20 20 MEQ TBCR (POTASSIUM CHLORIDE CRYS CR) 2 tabs m,w,f,sun and 3 tabs t,th,sat  #70 Tablet x 7   Entered by:   Kandice Hams   Authorized by:   Marga Melnick MD   Signed by:   Kandice Hams on 09/16/2008   Method used:   Telephoned to ...       [facility] [id number]* (retail)       [address]       [place], [place]  [address]       Ph: [phone]       Fax: [phone]   RxID:   [id number]

## 2010-06-04 NOTE — Letter (Signed)
Summary: [facility]  [facility]   Imported By: Lanelle Bal 12/18/2008 09:57:38  _____________________________________________________________________  External Attachment:    Type:   Image     Comment:   External Document

## 2010-06-04 NOTE — Letter (Signed)
Summary: Results Follow up Letter  [facility]  [address] [place], [place] [address]   Phone: [phone]  Fax: [phone]    12/22/2006 MRN: [id number]  BLYTHE VEACH [address] [place], [place]  [address]  Dear [name],  The following are the results of your recent test(s):  Test         Result    Pap Smear:        Normal _____  Not Normal _____ Comments: ______________________________________________________ Cholesterol: LDL(Bad cholesterol):         Your goal is less than:         HDL (Good cholesterol):       Your goal is more than: Comments:  ______________________________________________________ Mammogram:        Normal _____  Not Normal _____ Comments:  ___________________________________________________________________ Hemoccult:        Normal _____  Not normal _______ Comments:    _____________________________________________________________________ Other Tests:  Please see attached results and comments   We routinely do not discuss normal results over the telephone.  If you desire a copy of the results, or you have any questions about this information we can discuss them at your next office visit.   Sincerely,

## 2010-06-04 NOTE — Assessment & Plan Note (Signed)
Summary: flu shot/alr  Nurse Visit  Flu Vaccine Consent Questions     Do you have a history of severe allergic reactions to this vaccine? no    Any prior history of allergic reactions to egg and/or gelatin? no    Do you have a sensitivity to the preservative Thimersol? no    Do you have a past history of Guillan-Barre Syndrome? no    Do you currently have an acute febrile illness? no    Have you ever had a severe reaction to latex? no    Vaccine information given and explained to patient? yes    Are you currently pregnant? no    Lot Number:[id number]   Exp Date:10/30/2009   Site Given  Left Deltoid IM    Allergies: 1)  ! Amoxicillin 2)  ! * Fentanyl 3)  ! Nubain 4)  ! Effexor 5)  ! Percodan 6)  ! Percocet 7)  ! Hydrocodone 8)  ! Sulfa 9)  ! Versed 10)  ! * Tessalon Perls  Orders Added: 1)  Admin 1st Vaccine [90471] 2)  Flu Vaccine 76yrs + [84166]

## 2010-06-04 NOTE — Assessment & Plan Note (Signed)
Summary: COUGH, TROUBLE AT WORK WITH PERFUME, SNIFFLES///SPH   Vital Signs:  Patient profile:   55 year old female Weight:      173.6 pounds BMI:     29.91 Temp:     98.5 degrees F oral Pulse rate:   76 / minute Resp:     17 per minute BP sitting:   142 / 98  (left arm) Cuff size:   large  Vitals Entered By: Shonna Chock CMA (February 24, 2010 8:07 AM) CC: Cough and certain smells cause patient to have headaches   Primary Care Keagan Brislin:  Marga Melnick, MD   CC:  Cough and certain smells cause patient to have headaches.  History of Present Illness:  Cough      This is a 55 year old woman who presents with Cough for past week.  The patient reports non-productive cough and fever to 101.5, but denies pleuritic chest pain, wheezing, and exertional dyspnea.  The patient denies the following symptoms: cold/URI symptoms, sore throat, and nasal congestion. Partially effective prior treatments have included OTC cough medication( Dayquil & Robitussin) .  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  Recent labs reviewed & discussed.TSH is therapeutic.  Current Medications (verified): 1)  Klor-Con M20 20 Meq Tbcr (Potassium Chloride Crys Cr) .... 2 Tabs Once Daily Except 3 Pills T & Sat 2)  Lamictal 150 Mg  Tabs (Lamotrigine) .... 2 Tabs Qhs 3)  Wellbutrin Xl 150 Mg Xr24h-Tab (Bupropion Hcl) .... 3 By Mouth Once Daily 4)  Vistaril 50 Mg  Caps (Hydroxyzine Pamoate) .Marland Kitchen.. 1-2 Tabs Tid 5)  Ibuprofen 800 Mg Tabs (Ibuprofen) .Marland Kitchen.. 1 Tab By Mouth Q8 Hours 6)  Hyoscyamine Sulfate 0.125 Mg Subl (Hyoscyamine Sulfate) .... Dissolve 1-2 Every 4 Hours As Needed 7)  Ranitidine Hcl 150 Mg Tabs (Ranitidine Hcl) .Marland Kitchen.. 1 Two Times A Day 8)  Miralax   Powd (Polyethylene Glycol 3350) .... Take 1 Dose By Mouth Two Times A Day 9)  Cvs Stool Softener 100 Mg Caps (Docusate Sodium) .Marland Kitchen.. 1 Qd 10)  Dulcolax 5 Mg Tbec (Bisacodyl) .... Take 1-2 Tablets By Mouth As Needed For  Constipation 11)  Xanax (? Dose) .... As Needed  Allergies: 1)  ! Amoxicillin 2)  ! * Fentanyl 3)  ! Nubain 4)  ! Effexor 5)  ! Percodan 6)  ! Percocet 7)  ! Hydrocodone 8)  ! Sulfa 9)  ! Versed 10)  ! * Tessalon Perls 11)  ! * Steri Strips  Review of Systems ENT:  No persistent frontal headache, facial  pain or purulence. Allergy:  Denies itching eyes and sneezing.  Physical Exam  General:  in no acute distress; alert,appropriate and cooperative throughout examination Ears:  External ear exam shows no significant lesions or deformities.  Otoscopic examination reveals clear canals, tympanic membranes are intact bilaterally without bulging, retraction, inflammation or discharge. Hearing is grossly normal bilaterally.TMs scarred Nose:  External nasal examination shows no deformity or inflammation. Nasal mucosa are pink and moist without lesions or exudates. Mouth:  Oral mucosa and oropharynx without lesions or exudates.  No pharyngeal erythema.   Lungs:  Normal respiratory effort, chest expands symmetrically. Lungs are clear to auscultation, no crackles or wheezes. but dry cough Heart:  Normal rate and regular rhythm. S1 and S2 normal without gallop, murmur, click, rub.S4 Cervical Nodes:  No lymphadenopathy noted Axillary Nodes:  No palpable lymphadenopathy   Impression & Recommendations:  Problem # 1:  BRONCHITIS-ACUTE (ICD-466.0)  RAD component suggested by history   Her updated medication list for this problem includes:    Azithromycin 250 Mg Tabs (Azithromycin) .Marland Kitchen... As per pack    Qvar 80 Mcg/act Aers (Beclomethasone dipropionate) .Marland Kitchen... 1-2 puffs every 12 hrs ; gargle & spit after use  Problem # 2:  HYPOTHYROIDISM (ICD-244.9) corrected The following medications were removed from the medication list:    Synthroid 100 Mcg Tabs (Levothyroxine sodium) .Marland Kitchen... 1 by mouth once daily Her updated medication list for this problem includes:    Synthroid 100 Mcg Tabs  (Levothyroxine sodium) .Marland Kitchen... 1 once daily  Complete Medication List: 1)  Klor-con M20 20 Meq Tbcr (Potassium chloride crys cr) .... 2 tabs once daily except 3 pills t & sat 2)  Lamictal 150 Mg Tabs (Lamotrigine) .... 2 tabs qhs 3)  Wellbutrin Xl 150 Mg Xr24h-tab (Bupropion hcl) .... 3 by mouth once daily 4)  Vistaril 50 Mg Caps (Hydroxyzine pamoate) .Marland Kitchen.. 1-2 tabs tid 5)  Ibuprofen 800 Mg Tabs (Ibuprofen) .Marland Kitchen.. 1 tab by mouth q8 hours 6)  Hyoscyamine Sulfate 0.125 Mg Subl (Hyoscyamine sulfate) .... Dissolve 1-2 every 4 hours as needed 7)  Ranitidine Hcl 150 Mg Tabs (Ranitidine hcl) .Marland Kitchen.. 1 two times a day 8)  Miralax Powd (Polyethylene glycol 3350) .... Take 1 dose by mouth two times a day 9)  Cvs Stool Softener 100 Mg Caps (Docusate sodium) .Marland Kitchen.. 1 qd 10)  Dulcolax 5 Mg Tbec (Bisacodyl) .... Take 1-2 tablets by mouth as needed for constipation 11)  Xanax (? Dose)  .... As needed 12)  Synthroid 100 Mcg Tabs (Levothyroxine sodium) .Marland Kitchen.. 1 once daily 13)  Azithromycin 250 Mg Tabs (Azithromycin) .... As per pack 14)  Qvar 80 Mcg/act Aers (Beclomethasone dipropionate) .Marland Kitchen.. 1-2 puffs every 12 hrs ; gargle & spit after use  Patient Instructions: 1)  Drink as much  NON dairy fluid as you can tolerate for the next few days. Prescriptions: QVAR 80 MCG/ACT AERS (BECLOMETHASONE DIPROPIONATE) 1-2 puffs every 12 hrs ; gargle & spit after use  #1 x 0   Entered and Authorized by:   Marga Melnick MD   Signed by:   Marga Melnick MD on 02/24/2010   Method used:   Samples Given   RxID:   [id number] AZITHROMYCIN 250 MG TABS (AZITHROMYCIN) as per pack  #1 x 0   Entered and Authorized by:   Marga Melnick MD   Signed by:   Marga Melnick MD on 02/24/2010   Method used:   Print then Give to Patient   RxID:   [id number] SYNTHROID 100 MCG TABS (LEVOTHYROXINE SODIUM) 1 once daily  #90 x 3   Entered and Authorized by:   Marga Melnick MD   Signed by:   Marga Melnick MD on 02/24/2010   Method used:    Print then Give to Patient   RxID:   [id number]    Orders Added: 1)  Est. Patient Level III [01027]

## 2010-06-04 NOTE — Assessment & Plan Note (Signed)
Summary: ABOUT NEW THYROID MED//PH   Vital Signs:  Patient profile:   55 year old female Weight:      172.4 pounds BMI:     29.70 Pulse rate:   72 / minute Resp:     15 per minute BP sitting:   134 / 98  (left arm) Cuff size:   large  Vitals Entered By: Shonna Chock CMA (February 17, 2010 8:02 AM) CC: Patient was on thyroid medication 4-5 years and restarted it on her own about a month ago due to extreme fatigue. Patient states she feels much better since restarting med, patient has also noticed a decrease in hair loss that she was experiencing   Primary Care Namya Voges:  Marga Melnick, MD   CC:  Patient was on thyroid medication 4-5 years and restarted it on her own about a month ago due to extreme fatigue. Patient states she feels much better since restarting med and patient has also noticed a decrease in hair loss that she was experiencing.  History of Present Illness: Labs reviewed ; HCT was 39.7 & MCV was WNL , but B12 was 183( normal > 211). Now on 1300 units of B12 once daily by mouth . TSH was 2.69 in 10/2009 on no meds;she found old (? 55 years old) Synthroid 100 micrograms which  she has taken X 1 month.She has lost 18# (?) on this dose; her chronic fatigue is dramatically better.  Current Medications (verified): 1)  Klor-Con M20 20 Meq Tbcr (Potassium Chloride Crys Cr) .... 2 Tabs Once Daily Except 3 Pills T & Sat 2)  Lamictal 150 Mg  Tabs (Lamotrigine) .... 2 Tabs Qhs 3)  Wellbutrin Xl 150 Mg Xr24h-Tab (Bupropion Hcl) .... 3 By Mouth Once Daily 4)  Vistaril 50 Mg  Caps (Hydroxyzine Pamoate) .Marland Kitchen.. 1-2 Tabs Tid 5)  Ibuprofen 800 Mg Tabs (Ibuprofen) .Marland Kitchen.. 1 Tab By Mouth Q8 Hours 6)  Hyoscyamine Sulfate 0.125 Mg Subl (Hyoscyamine Sulfate) .... Dissolve 1-2 Every 4 Hours As Needed 7)  Ranitidine Hcl 150 Mg Tabs (Ranitidine Hcl) .Marland Kitchen.. 1 Two Times A Day 8)  Miralax   Powd (Polyethylene Glycol 3350) .... Take 1 Dose By Mouth Two Times A Day 9)  Cvs Stool Softener 100 Mg Caps (Docusate  Sodium) .Marland Kitchen.. 1 Qd 10)  Dulcolax 5 Mg Tbec (Bisacodyl) .... Take 1-2 Tablets By Mouth As Needed For Constipation 11)  Xanax (? Dose) .... As Needed 12)  Synthroid 100 Mcg Tabs (Levothyroxine Sodium) .Marland Kitchen.. 1 By Mouth Once Daily  Allergies: 1)  ! Amoxicillin 2)  ! * Fentanyl 3)  ! Nubain 4)  ! Effexor 5)  ! Percodan 6)  ! Percocet 7)  ! Hydrocodone 8)  ! Sulfa 9)  ! Versed 10)  ! * Tessalon Perls 11)  ! * Steri Strips  Review of Systems CV:  Occasional palpitations. GI:  Denies constipation and diarrhea. Derm:  Denies changes in nail beds and dryness; Hair loss improved . Neuro:  Denies numbness and tingling. Endo:  Complains of heat intolerance; denies cold intolerance.  Physical Exam  General:  well-nourished; alert,appropriate and cooperative throughout examination Eyes:  No corneal or conjunctival inflammation noted.No lid lag Neck:  No deformities, masses, or tenderness noted. Heart:  Normal rate and regular rhythm. S1 and S2 normal without gallop, murmur, click, rub or other extra sounds. Neurologic:  alert & oriented X3 and DTRs symmetrical and normal.   No tremor   Impression & Recommendations:  Problem # 1:  NONSPECIFIC  ABNORM RESULTS THYROID FUNCT STUDY (ICD-794.5) Note: on 6 yr oldSynthroid 100 micrograms once daily X 1 month Orders: Venipuncture (65784) TLB-TSH (Thyroid Stimulating Hormone) (84443-TSH)  Problem # 2:  HYPOKALEMIA (ICD-276.8)  Orders: Venipuncture (69629) TLB-Potassium (K+) (84132-K)  Problem # 3:  B12 DEFICIENCY (ICD-266.2) w/o anemia & w/o increased MCV  Complete Medication List: 1)  Klor-con M20 20 Meq Tbcr (Potassium chloride crys cr) .... 2 tabs once daily except 3 pills t & sat 2)  Lamictal 150 Mg Tabs (Lamotrigine) .... 2 tabs qhs 3)  Wellbutrin Xl 150 Mg Xr24h-tab (Bupropion hcl) .... 3 by mouth once daily 4)  Vistaril 50 Mg Caps (Hydroxyzine pamoate) .Marland Kitchen.. 1-2 tabs tid 5)  Ibuprofen 800 Mg Tabs (Ibuprofen) .Marland Kitchen.. 1 tab by mouth q8  hours 6)  Hyoscyamine Sulfate 0.125 Mg Subl (Hyoscyamine sulfate) .... Dissolve 1-2 every 4 hours as needed 7)  Ranitidine Hcl 150 Mg Tabs (Ranitidine hcl) .Marland Kitchen.. 1 two times a day 8)  Miralax Powd (Polyethylene glycol 3350) .... Take 1 dose by mouth two times a day 9)  Cvs Stool Softener 100 Mg Caps (Docusate sodium) .Marland Kitchen.. 1 qd 10)  Dulcolax 5 Mg Tbec (Bisacodyl) .... Take 1-2 tablets by mouth as needed for constipation 11)  Xanax (? Dose)  .... As needed 12)  Synthroid 100 Mcg Tabs (Levothyroxine sodium) .Marland Kitchen.. 1 by mouth once daily  Patient Instructions: 1)  Please schedule a follow-up Lab  appointment in 2 months for TSH , B12 level..   Orders Added: 1)  Est. Patient Level III [52841] 2)  Venipuncture [32440] 3)  TLB-TSH (Thyroid Stimulating Hormone) [84443-TSH] 4)  TLB-Potassium (K+) [10272-Z]

## 2010-06-04 NOTE — Progress Notes (Signed)
Summary: HA appt  ---- Converted from flag ---- ---- 05/25/2010 2:40 PM, Magdalen Spatz [facility] wrote: Tresa Endo,  Per [facility], the referral 1st goes to their insurance dept for benefits info, the to scheduling.  They will contact pt to schedule, then fax [place] the appt date & time.  They state this is normally a 1-2 week turn-around time.  I did inform them to please rush.  Renee ------------------------------  Noted. Lucious Groves CMA  May 25, 2010 4:13 PM

## 2010-06-04 NOTE — Letter (Signed)
Summary: External Correspondence-POLYSOMNOGRAM  External Correspondence-POLYSOMNOGRAM   Imported By: Freddy Jaksch 11/30/2006 15:53:54  _____________________________________________________________________  External Attachment:    Type:   Image     Comment:   [facility]

## 2010-06-04 NOTE — Letter (Signed)
Summary: Results Follow up Letter  [place] [facility] [place]  [address] [place], [place] [address]   Phone: [phone]  Fax: [phone]    05/21/2009 MRN: [id number]  [name] [address] [place], [place]  [address]  Dear [name],  The following are the results of your recent test(s):  Test         Result    Pap Smear:        Normal _____  Not Normal _____ Comments: ______________________________________________________ Cholesterol: LDL(Bad cholesterol):         Your goal is less than:         HDL (Good cholesterol):       Your goal is more than: Comments:  ______________________________________________________ Mammogram:        Normal _____  Not Normal _____ Comments:  ___________________________________________________________________ Hemoccult:        Normal _____  Not normal _______ Comments:    _____________________________________________________________________ Other Tests: Please see attached labs done on 05/20/2009    We routinely do not discuss normal results over the telephone.  If you desire a copy of the results, or you have any questions about this information we can discuss them at your next office visit.   Sincerely,

## 2010-06-04 NOTE — Progress Notes (Signed)
Summary: PT REFERRAL  Phone Note Outgoing Call   Call placed by: Magdalen Spatz [facility],  November 11, 2008 9:46 AM Call placed to: Specialist Summary of Call: FYI...............Marland KitchenREFERENCE PT REFERRAL.  PER [facility] ON [address], PT WAS SCH'D FOR 10-24-08 @ 8AM & CXLED & DID NOT RSC.  i s/w pt who says she went to ER & to dr Blenda Bridegroom office for her pain, she has not decided whether she stll wants this referral or not, if so she will call & let us know. Initial call taken by: Magdalen Spatz [facility]  November 11, 2008 9:46 AM  Follow-up for Phone Call        noted Follow-up by: Marga Melnick MD,  November 11, 2008 10:24 AM

## 2010-06-04 NOTE — Letter (Signed)
Summary: Results Follow up Letter  [place] [facility] [place]  [address] [place], [place] [address]   Phone: [phone]  Fax: [phone]    03/15/2008 MRN: [id number]  KATHLEEN TAMM [address] [place], [place]  [address]  Dear [name],  The following are the results of your recent test(s):  Test         Result    Pap Smear:        Normal _____  Not Normal _____ Comments: ______________________________________________________ Cholesterol: LDL(Bad cholesterol):         Your goal is less than:         HDL (Good cholesterol):       Your goal is more than: Comments:  ______________________________________________________ Mammogram:        Normal _____  Not Normal _____ Comments:  ___________________________________________________________________ Hemoccult:        Normal _____  Not normal _______ Comments:    _____________________________________________________________________ Other Tests: PLEASE SEE ATTACHED LABS DONE ON 03/14/2008    We routinely do not discuss normal results over the telephone.  If you desire a copy of the results, or you have any questions about this information we can discuss them at your next office visit.   Sincerely,

## 2010-06-04 NOTE — Letter (Signed)
Summary: Results Follow up Letter  [place] [facility] [place]  [address] [place], [place] [address]   Phone: [phone]  Fax: [phone]    10/23/2008 MRN: [id number]  Sarah Salazar [address] [place], [place]  [address]  Dear [name],  The following are the results of your recent test(s):  Test         Result    Pap Smear:        Normal _____  Not Normal _____ Comments: ______________________________________________________ Cholesterol: LDL(Bad cholesterol):         Your goal is less than:         HDL (Good cholesterol):       Your goal is more than: Comments:  ______________________________________________________ Mammogram:        Normal _____  Not Normal _____ Comments:  ___________________________________________________________________ Hemoccult:        Normal _____  Not normal _______ Comments:    _____________________________________________________________________ Other Tests: PLEASE SEE ATTACHED LABS DONE ON 10/22/2008    We routinely do not discuss normal results over the telephone.  If you desire a copy of the results, or you have any questions about this information we can discuss them at your next office visit.   Sincerely,

## 2010-06-04 NOTE — Progress Notes (Signed)
Summary: [facility]   Imported By: Sherian Rein 02/12/2009 10:14:53  _____________________________________________________________________  External Attachment:    Type:   Image     Comment:   External Document
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
## 2010-06-04 NOTE — Letter (Signed)
Summary: [facility]  [facility]   Imported By: Lanelle Bal 12/18/2008 09:54:35  _____________________________________________________________________  External Attachment:    Type:   Image     Comment:   External Document

## 2010-06-04 NOTE — Consult Note (Signed)
Summary: [facility]  [facility]   Imported By: Lanelle Bal 01/02/2008 12:14:22  _____________________________________________________________________  External Attachment:    Type:   Image     Comment:   External Document

## 2010-06-04 NOTE — Progress Notes (Addendum)
Summary: HA's   Phone Note Call from Patient Call back at Work Phone [phone]   Caller: Patient Summary of Call: Pt called and states that she feels that her HA's are stressed releated. Her medication does not help unless she is not at work. She states she is now having a new symptom of feeling like a stabbing feeling in (R) eye. Does she need to see a Neuro at this point? She states she has only gone 48 hours without a HA. Please advise. Army Fossa CMA  May 21, 2010 12:53 PM   Follow-up for Phone Call        see referral Follow-up by: Marga Melnick MD,  May 21, 2010 2:51 PM  Additional Follow-up for Phone Call Additional follow up Details #1::        pt is calling again because she hasnt received a call back from yesterday. I told pt that a referral had been  put in for her and she will be contacted with the info. Pt states " So what do I do now; Ive already missed 2 days of work". Please advise. Additional Follow-up by: Lavell Islam,  May 22, 2010 11:27 AM    Additional Follow-up for Phone Call Additional follow up Details #2::    Based on her observation that stress is the  major trigger for her headaches ; she should ask Dr Tomasa Rand to   possibly give her temporary work absence until stress can be addressed & controlled. Clinically medication adjustment by Dr Tomasa Rand is needed. [facility] can work with him to prevent adverse medication interactions Follow-up by: Marga Melnick MD,  May 22, 2010 4:09 PM  Additional Follow-up for Phone Call Additional follow up Details #3:: Details for Additional Follow-up Action Taken: Patient notified.  Additional Follow-up by: Lucious Groves CMA,  May 22, 2010 4:37 PM   Appended Document: HA's    Phone Note Outgoing Call   Summary of Call: pt calls stating that Dr. Tomasa Rand has nothing to do with her headaches that Dr. Alwyn Ren will have to be the one to write her out of work. Please advise. pt can  be contacted at [phone] .Marland KitchenLavell Islam  May 25, 2010 9:38 AM     Follow-up for Phone Call        Dr.Hopper please futher advise Follow-up by: Shonna Chock CMA,  May 25, 2010 10:28 AM  Additional Follow-up for Phone Call Additional follow up Details #1::        Her  anxiety state has decompensated  & is trigger for headaches. Neuro exam was negative. I'll FAX notes to Dr Tomasa Rand  requesting he see her ASAP Additional Follow-up by: Marga Melnick MD,  May 25, 2010 2:22 PM    Additional Follow-up for Phone Call Additional follow up Details #2::    [facility] has sent referral and they will contact the patient. Patient notified. Lucious Groves CMA  May 25, 2010 2:32 PM

## 2010-06-04 NOTE — Letter (Signed)
Summary: Out of Work  [facility]  [address] [place], [place] [address]   Phone: [phone]  Fax: [phone]    May 14, 2010   Employee:  JAMIL CASTILLO    To Whom It May Concern:   For Medical reasons, please excuse the above named employee from work for the following dates:  Start:   05/14/2010  End:   05/18/2010 (Return on Monday)   If you need additional information, please feel free to contact our office.         Sincerely,    Chrae Marlynn Perking CMA

## 2010-06-04 NOTE — Assessment & Plan Note (Signed)
Summary: FOR NEURO REFERRAL//PH   Vital Signs:  Patient profile:   55 year old female Weight:      175 pounds BMI:     30.15 Temp:     99.0 degrees F oral Pulse rate:   84 / minute Resp:     15 per minute BP sitting:   120 / 88  (left arm) Cuff size:   large  Vitals Entered By: Shonna Chock CMA (May 14, 2010 1:59 PM) CC: 1.) Headaches and hallucinations (Per Dr.Willets and Dr.Cunningham) patient would like to discuss referral to Neuro   2.) Examine feet , Depressive symptoms   Primary Care Provider:  Marga Melnick, MD   CC:  1.) Headaches and hallucinations (Per Dr.Willets and Dr.Cunningham) patient would like to discuss referral to Neuro   2.) Examine feet  and Depressive symptoms.  History of Present Illness:      This is a 55 year old woman who presents with Headaches for 1 month, worse over past 2 weeks.  The patient denies nausea, vomiting, sweats, tearing of eyes, nasal congestion, sinus pressure, photophobia, and phonophobia.  The headache is described as intermittent, throbbing, and band-like.  The location of the pain initially  is in  the  occipital area with subsequent radiation to  bitemporal sites.  High-risk features (red flags) include neck pain/stiffness, new type of headache, and age >50 years.  The patient denies the following high-risk features: fever, vision loss or change, focal weakness, altered mental status, rash, trauma, pain worse with exertion, immunosuppression, and anticoagulation use.  The headaches are precipitated by stress.  Prior treatment has included a NSAIDS, Dayquil & vaporizer.      The patient also presents with  Hallucinations as stranger in BR with her @ night.  The  insulation has been disturbed  under her mobile home & lock cut off the power company   meter  box.  Taped placed over door frame has been disturbed overnight.The patient reports insomnia, but denies depressed mood, loss of interest/pleasure, significant weight loss, and hypersomnia.   The patient denies fatigue or loss of energy.  The patient reports the following psychosocial stressors: major life changes @ job & with insurance coverage of meds.  The patient reports some  abnormally irritable mood.  The patient denies distractibility and flight of ideas. Dr. Tomasa Rand has Rxed Haloperidol 2 mg  at bedtime .  Current Medications (verified): 1)  Klor-Con M20 20 Meq Tbcr (Potassium Chloride Crys Cr) .... 2 Tabs Once Daily Except 3 Pills T & Sat 2)  Lamictal 150 Mg  Tabs (Lamotrigine) .... 2 Tabs Qhs 3)  Wellbutrin 100 Mg Tabs (Bupropion Hcl) .Marland Kitchen.. 1 By Mouth Three Times A Day 4)  Vistaril 50 Mg  Caps (Hydroxyzine Pamoate) .Marland Kitchen.. 1-2 Tabs Tid 5)  Ibuprofen 800 Mg Tabs (Ibuprofen) .Marland Kitchen.. 1 Tab By Mouth Q8 Hours 6)  Hyoscyamine Sulfate 0.125 Mg Subl (Hyoscyamine Sulfate) .... Dissolve 1-2 Every 4 Hours As Needed 7)  Ranitidine Hcl 150 Mg Tabs (Ranitidine Hcl) .Marland Kitchen.. 1 Two Times A Day 8)  Miralax   Powd (Polyethylene Glycol 3350) .... Take 1 Dose By Mouth Two Times A Day 9)  Cvs Stool Softener 100 Mg Caps (Docusate Sodium) .Marland Kitchen.. 1 Qd 10)  Dulcolax 5 Mg Tbec (Bisacodyl) .... Take 1-2 Tablets By Mouth As Needed For Constipation 11)  Xanax 0.5 Mg Tabs (Alprazolam) .... 1/2-1 By Mouth Two Times A Day 12)  Synthroid 100 Mcg Tabs (Levothyroxine Sodium) .Marland Kitchen.. 1 Once Daily  13)  Qvar 80 Mcg/act Aers (Beclomethasone Dipropionate) .Marland Kitchen.. 1-2 Puffs Every 12 Hrs ; Gargle & Spit After Use  Allergies: 1)  ! Amoxicillin 2)  ! * Fentanyl 3)  ! Nubain 4)  ! Effexor 5)  ! Percodan 6)  ! Percocet 7)  ! Hydrocodone 8)  ! Sulfa 9)  ! Versed 10)  ! * Tessalon Perls 11)  ! * Steri Strips  Review of Systems Eyes:  Denies blurring, double vision, and vision loss-both eyes. ENT:  Denies decreased hearing and ringing in ears.  Physical Exam  General:  well-nourished,in no acute distress; appropriate and cooperative throughout examination Head:  Normocephalic and atraumatic without obvious abnormalities  or tenderness Eyes:  No corneal or conjunctival inflammation noted. EOMI. Perrla. Field of  Vision grossly normal. Ears:  External ear exam shows no significant lesions or deformities.  Otoscopic examination reveals clear canals, tympanic membranes are intact bilaterally without bulging, retraction, inflammation or discharge. Hearing is grossly normal bilaterally.TMs scarred Nose:  External nasal examination shows no deformity or inflammation. Nasal mucosa are pink and moist without lesions or exudates. Minor septal dislocation Mouth:  Oral mucosa and oropharynx without lesions or exudates.  Teeth in good repair. No tongue deviation Neck:  No deformities, masses, or tenderness noted. Full ROM Heart:  Normal rate and regular rhythm. S1 and S2 normal without gallop, murmur, click, rub . Pulses:  R and L carotid,radial  pulses are full and equal bilaterally Neurologic:  alert & oriented X3, cranial nerves II-XII intact, strength normal in all extremities, sensation intact to light touch, heel / toe  gait normal, DTRs symmetrical and normal, finger-to-nose normal, heel-to-shin normal, and Romberg negative.   Repetitive speech & RAM normal Skin:  Intact without suspicious lesions or rashes Cervical Nodes:  No lymphadenopathy noted Axillary Nodes:  No palpable lymphadenopathy Psych:  normally interactive, good eye contact, and subdued.     Impression & Recommendations:  Problem # 1:  HEADACHE (ICD-784.0)  Her updated medication list for this problem includes:    Ibuprofen 800 Mg Tabs (Ibuprofen) .Marland Kitchen... 1 tab by mouth q8 hours  Orders: Venipuncture (16109) TLB-Sedimentation Rate (ESR) (85652-ESR)  Problem # 2:  HALLUCINATIONS (ICD-780.1)  Orders: Venipuncture (60454) TLB-BMP (Basic Metabolic Panel-BMET) (80048-METABOL) TLB-Hepatic/Liver Function Pnl (80076-HEPATIC) TLB-Magnesium (Mg) (83735-MG)  Problem # 3:  B12 DEFICIENCY (ICD-266.2)  Orders: Venipuncture (09811) TLB-CBC Platelet -  w/Differential (85025-CBCD) TLB-B12, Serum-Total ONLY (91478-G95)  Problem # 4:  HYPOTHYROIDISM (ICD-244.9)  Her updated medication list for this problem includes:    Synthroid 100 Mcg Tabs (Levothyroxine sodium) .Marland Kitchen... 1 once daily  Orders: Venipuncture (62130) TLB-TSH (Thyroid Stimulating Hormone) (84443-TSH)  Complete Medication List: 1)  Klor-con M20 20 Meq Tbcr (Potassium chloride crys cr) .... 2 tabs once daily except 3 pills t & sat 2)  Lamictal 150 Mg Tabs (Lamotrigine) .... 2 tabs qhs 3)  Wellbutrin 100 Mg Tabs (Bupropion hcl) .Marland Kitchen.. 1 by mouth three times a day 4)  Vistaril 50 Mg Caps (Hydroxyzine pamoate) .Marland Kitchen.. 1-2 tabs tid 5)  Ibuprofen 800 Mg Tabs (Ibuprofen) .Marland Kitchen.. 1 tab by mouth q8 hours 6)  Hyoscyamine Sulfate 0.125 Mg Subl (Hyoscyamine sulfate) .... Dissolve 1-2 every 4 hours as needed 7)  Ranitidine Hcl 150 Mg Tabs (Ranitidine hcl) .Marland Kitchen.. 1 two times a day 8)  Miralax Powd (Polyethylene glycol 3350) .... Take 1 dose by mouth two times a day 9)  Cvs Stool Softener 100 Mg Caps (Docusate sodium) .Marland Kitchen.. 1 qd 10)  Dulcolax 5 Mg Tbec (  Bisacodyl) .... Take 1-2 tablets by mouth as needed for constipation 11)  Xanax 0.5 Mg Tabs (Alprazolam) .... 1/2-1 by mouth two times a day 12)  Synthroid 100 Mcg Tabs (Levothyroxine sodium) .Marland Kitchen.. 1 once daily 13)  Qvar 80 Mcg/act Aers (Beclomethasone dipropionate) .Marland Kitchen.. 1-2 puffs every 12 hrs ; gargle & spit after use 14)  Gabapentin 100 Mg Caps (Gabapentin) .Marland Kitchen.. 1 every 8 hrs as needed headache  Patient Instructions: 1)  Keep Headache Diary as discussed. Prescriptions: GABAPENTIN 100 MG CAPS (GABAPENTIN) 1 every 8 hrs as needed headache  #30 x 0   Entered and Authorized by:   Marga Melnick MD   Signed by:   Marga Melnick MD on 05/14/2010   Method used:   Electronically to        [facility] [phone]* (retail)       [address]       [place], [place]  [address]       Ph: [phone]       Fax:  [phone]   RxID:   [id number]    Orders Added: 1)  Est. Patient Level IV [62376] 2)  Venipuncture [28315] 3)  TLB-BMP (Basic Metabolic Panel-BMET) [80048-METABOL] 4)  TLB-CBC Platelet - w/Differential [85025-CBCD] 5)  TLB-Hepatic/Liver Function Pnl [80076-HEPATIC] 6)  TLB-TSH (Thyroid Stimulating Hormone) [84443-TSH] 7)  TLB-B12, Serum-Total ONLY [82607-B12] 8)  TLB-Sedimentation Rate (ESR) [85652-ESR] 9)  TLB-Magnesium (Mg) [83735-MG]  Appended Document: FOR NEURO REFERRAL//PH

## 2010-06-04 NOTE — Progress Notes (Signed)
Summary: Ongoing Back Pain  Phone Note Call from Patient Call back at Home Phone [phone]   Caller: Patient Call For: Marga Melnick MD Summary of Call: MESSAGE LEFT ON VOICEMAIL: PATIENT RECENTLY SEEN FOR UTI/BACK PAIN, PATIENT FINISHED ABX, SEEN ORTHRO (MRI WAS ORDERED) COPY WAS TO BE SENT TO DR.HOPPER, PATIENT'S BACK IS NO BETTER, PATIENT WOULD LIKE TO KNOW THE NEXT STEP./Sarah Salazar  November 11, 2008 9:38 AM   Follow-up for Phone Call        Dr Farris Has is a musculoskeletal specialist; he  should determine what course (PT vs medication ) is indicated based on MRI he ordered. I have not received his report & recommendations Follow-up by: Marga Melnick MD,  November 11, 2008 12:03 PM  Additional Follow-up for Phone Call Additional follow up Details #1::        LEFT MESSAGE ON MACHINE WITH ABOVE RESOPNSE. Additional Follow-up by: Shonna Chock,  November 11, 2008 12:15 PM

## 2010-06-04 NOTE — Progress Notes (Signed)
[facility]   Imported By: Sherian Rein 02/12/2009 10:16:36  _____________________________________________________________________  External Attachment:    Type:   Image     Comment:   External Document

## 2010-06-04 NOTE — Consult Note (Signed)
Summary: [facility]  [facility]   Imported By: Lanelle Bal 02/08/2008 12:43:00  _____________________________________________________________________  External Attachment:    Type:   Image     Comment:   External Document

## 2010-06-04 NOTE — Progress Notes (Signed)
Summary: Cx ZCO  Phone Note Call from Patient Call back at Home Phone [phone]   Call For: Dr Leone Payor Reason for Call: Talk to Nurse Summary of Call: Actually wants to cancell her ZCO on 05-08-2009 and the appt with the anesthisiologist on 05-06-2009 at 1:30pm because she is just getting back to work and will not be able to be off  Initial call taken by: Leanor Kail [facility],  March 17, 2009 12:57 PM  Follow-up for Phone Call        [facility] to pt, message left that we will cancel appts.  Advised pt I would let Dr.Gessner know she needed to cancel.  Advised the pt to call me with any questions and to call our office when she is ready to schedule her colonscopy. Follow-up by: Francee Piccolo CMA Duncan Dull),  March 18, 2009 9:36 AM  Additional Follow-up for Phone Call Additional follow up Details #1::        we may be able to do it this month or next month see if she is willing and see what dates are available they have opened up some Wednesdays also. Please call [facility] and see what availability there is in Dec. Then let me know and we can see if she will do it then. (see if she is willing first, before asking [facility] Additional Follow-up by: Iva Boop MD, Clementeen Graham,  March 18, 2009 6:57 PM     Appended Document: Cx ZCO I called pt at home number and a female answered.  When I asked for the pt I was told I had the wrong number.  I verified number with our records and was again told I had the wrong number.  2nd call to voicemail.  Message left to return call.  Appended Document: Cx ZCO I spoke to pt and she states she is unable to do procedure before the first of the year.  She thinks she may be able to do it at the end of January.  I informed her that I did not have a schedule out that far, and I will call her to reschedule when we do have those schedules and can coordinate a time.  Pt is agreeable.  Appended Document: Cx ZCO ok, she needs to let us know when she is ready and we will  arrange propofol colonoscopy

## 2010-06-04 NOTE — Progress Notes (Signed)
Summary: seen yesterday back pain no better//HOP  Phone Note Call from Patient Call back at [phone]   Caller: Patient Summary of Call: pt called said her back painis worse this am in low back, stomach not hurting just in lower back,Did not get a note from being out yesterday, but nopt able to go in today, will definitely need note today. Do Dr Alwyn Ren want me to take the Hycosamine to see if helps with the low part of my back? Initial call taken by: Kandice Hams,  October 15, 2008 8:44 AM  Follow-up for Phone Call        DR.HOPPER PLEASE ADVISE, I WILL GIVE PATIENT WORK NOTE. Follow-up by: Shonna Chock,  October 15, 2008 10:36 AM  Additional Follow-up for Phone Call Additional follow up Details #1::        As Tylenol is only safe med due to multiple allergies (ask Pharmacist if generic Mobic is [facility]; PMH of some reaction to Percodan). Work note OK ; PT if no better Additional Follow-up by: Marga Melnick MD,  October 15, 2008 1:40 PM    Additional Follow-up for Phone Call Additional follow up Details #2::    D/W PATIENT, PATIENT OK'D INSTRUCTION.  [name]  October 15, 2008 2:37 PM

## 2010-06-04 NOTE — Letter (Signed)
Summary: Results Follow up Letter  [facility] [place]  [address] [place], [place] [address]   Phone: [phone]  Fax: [phone]    10/31/2006 MRN: [id number]   [facility] [place] [address] [place], [place]  [address]  Dear [name],  The following are the results of your recent test(s):  Test         Result    Pap Smear:        Normal _____  Not Normal _____ Comments: ______________________________________________________ Cholesterol: LDL(Bad cholesterol):         Your goal is less than:         HDL (Good cholesterol):       Your goal is more than: Comments:  ______________________________________________________ Mammogram:        Normal _____  Not Normal _____ Comments:  ___________________________________________________________________ Hemoccult:        Normal _____  Not normal _______ Comments:    _____________________________________________________________________ Other Tests:  Please see attached results and comments   We routinely do not discuss normal results over the telephone.  If you desire a copy of the results, or you have any questions about this information we can discuss them at your next office visit.   Sincerely,

## 2010-06-04 NOTE — Letter (Signed)
Summary: [facility]   Imported By: Lanelle Bal 05/02/2009 09:58:51  _____________________________________________________________________  External Attachment:    Type:   Image     Comment:   External Document

## 2010-06-04 NOTE — Assessment & Plan Note (Signed)
Summary: ACUTE ONLY:COUGH, GREEN PHLEM, MILD FEVER//ALJ   Vital Signs:  Patient Profile:   55 Years Old Female Weight:      170.2 pounds Temp:     99.5 degrees F oral Pulse rate:   80 / minute Resp:     15 per minute BP sitting:   110 / 76  (left arm) Cuff size:   large  Pt. in pain?   no  Vitals Entered By: Shonna Chock (August 08, 2007 4:12 PM)                  Chief Complaint:  COUGH, CONGESTION, AND FEVER X 1 WEEK, and URI symptoms.  History of Present Illness:  URI Symptoms; Rx: Excedrin, Vistaril      This is a 55 year old woman who presents with URI symptoms.  Bilat facial pain.  The patient reports nasal congestion, but denies clear nasal discharge, purulent nasal discharge, sore throat, dry cough, productive cough, earache, and sick contacts.  Associated symptoms include fever, fever of 100.5-103 degrees, use of an antipyretic, and response to antipyretic.  The patient denies low-grade fever (<100.5 degrees), fever of 103.1-104 degrees, fever to >104 degrees, stiff neck, dyspnea, wheezing, rash, vomiting, and diarrhea.  The patient also reports sneezing.  The patient denies itchy watery eyes, itchy throat, seasonal symptoms, response to antihistamine, headache, muscle aches, and severe fatigue.  Risk factors for Strep sinusitis include double sickening.  The patient denies the following risk factors for Strep sinusitis: tooth pain, Strep exposure, and tender adenopathy.      Current Allergies (reviewed today): ! AMOXICILLIN ! * FENTANYL ! NUBAIN ! EFFEXOR ! PERCODAN ! PERCOCET ! HYDROCODONE ! SULFA ! VERSED ! * TESSALON PERLS      Physical Exam  General:     Well-developed,well-nourished,in no acute distress; alert,appropriate and cooperative throughout examination Eyes:     No corneal or conjunctival inflammation noted. EOMI. Perrla.. Vision grossly normal. Ears:     External ear exam shows no significant lesions or deformities.  Otoscopic examination  reveals clear canals, tympanic membranes are intact bilaterally without bulging, retraction, inflammation or discharge. Hearing is grossly normal bilaterally.Fine scarring of TMs Nose:     External nasal examination shows no deformity or inflammation. Nasal mucosa : erythema on L without lesions or exudates. Mouth:     Oral mucosa and oropharynx without lesions or exudates.  Teeth in good repair. Mild erythema Lungs:     Normal respiratory effort, chest expands symmetrically. Lungs are clear to auscultation, no crackles or wheezes. Cervical Nodes:     No lymphadenopathy noted Axillary Nodes:     No palpable lymphadenopathy    Impression & Recommendations:  Problem # 1:  SINUSITIS- ACUTE-NOS (ICD-461.9)  Her updated medication list for this problem includes:    Clarithromycin 500 Mg Tb24 (Clarithromycin) .Marland Kitchen... 2 once daily with food   Complete Medication List: 1)  Klor-con M20 20 Meq Tbcr (Potassium chloride crys cr) .... 2 tabs m,w,f,sun and 3 tabs t,th,sat 2)  Lamictal 150 Mg Tabs (Lamotrigine) .... 2 tabs qhs 3)  Wellbutrin Xl 300 Mg Tb24 (Bupropion hcl) .Marland Kitchen.. 1 by mouth qam 4)  Abilify 10 Mg Tabs (Aripiprazole) .Marland Kitchen.. 1 by mouth qd 5)  Vistaril 50 Mg Caps (Hydroxyzine pamoate) .Marland Kitchen.. 1-2 tabs tid 6)  Xanax 0.25 Mg Tabs (Alprazolam) .Marland Kitchen.. 1 by mouth prn 7)  Clarithromycin 500 Mg Tb24 (Clarithromycin) .... 2 once daily with food   Patient Instructions: 1)  Drink as  much fluid as you can tolerate for the next few days.Neti pot once daily until sinuses clear    Prescriptions: CLARITHROMYCIN 500 MG  TB24 (CLARITHROMYCIN) 2 once daily with food  #20 x 0   Entered and Authorized by:   Marga Melnick MD   Signed by:   Marga Melnick MD on 08/08/2007   Method used:   Print then Give to Patient   RxID:   [id number]  ]

## 2010-06-04 NOTE — Consult Note (Signed)
Summary: [facility]  [facility] [place]   Imported By: Lanelle Bal 12/05/2007 11:33:00  _____________________________________________________________________  External Attachment:    Type:   Image     Comment:   External Document

## 2010-06-04 NOTE — Consult Note (Signed)
Summary: [facility]  [facility] [place]   Imported By: Lanelle Bal 12/13/2007 12:11:03  _____________________________________________________________________  External Attachment:    Type:   Image     Comment:   External Document

## 2010-06-04 NOTE — Letter (Signed)
Summary: [facility]  [facility] [place]   Imported By: Lanelle Bal 03/20/2008 10:41:26  _____________________________________________________________________  External Attachment:    Type:   Image     Comment:   External Document

## 2010-06-04 NOTE — Assessment & Plan Note (Signed)
Summary: PT WANTS TO DISCUSS LAB WORK//CA  Medications Added KLOR-CON M20 20 MEQ TBCR (POTASSIUM CHLORIDE CRYS CR) 2 tabs m,w,f,sun and 3 tabs t,th,sat LAMICTAL 150 MG  TABS (LAMOTRIGINE) 2 tabs qhs WELLBUTRIN XL 300 MG  TB24 (BUPROPION HCL) 1 by mouth qam ABILIFY 10 MG  TABS (ARIPIPRAZOLE) 1 by mouth qd VISTARIL 50 MG  CAPS (HYDROXYZINE PAMOATE) 1-2 tabs tid XANAX 0.25 MG  TABS (ALPRAZOLAM)         Vital Signs:  Patient Profile:   55 Years Old Female Weight:      184.25 pounds Pulse rate:   68 / minute Pulse rhythm:   regular BP sitting:   110 / 74  (left arm) Cuff size:   large  Pt. in pain?   no  Vitals Entered By: Wendall Stade (October 27, 2006 2:51 PM)                Chief Complaint:  follow up labs and check urine.  History of Present Illness: to discuss labs and her urine is dark and she wanted it checked for uti. See NMR  2/07;LDL 140 with 1560 total # & 635 small#. LDL goal =< 110.Lipids done due to Rx from Dr Tomasa Rand.      Risk Factors:  Tobacco use:  never Alcohol use:  yes    Type:  rare Exercise:  no   Review of Systems  General      Denies chills, fever, and sweats.  GU      Complains of urinary frequency and urinary hesitancy.      Denies abnormal vaginal bleeding, discharge, dysuria, hematuria, incontinence, and nocturia.      no flank pain   Physical Exam  Heart:     Normal rate and regular rhythm. S1 and S2 normal without gallop, murmur, click, rub or other extra sounds. Msk:     no flank pain Pulses:     R and L carotid,radial,dorsalis pedis and posterior tibial pulses are full and equal bilaterally    Impression & Recommendations:  Problem # 1:  HYPERLIPIDEMIA NEC/NOS (ICD-272.4)  Problem # 2:  SYMPTOM, POLYURIA (ICD-788.42)  Problem # 3:  FASTING HYPERGLYCEMIA (ICD-790.6)  Orders: TLB-A1C / Hgb A1C (Glycohemoglobin) (83036-A1C)   Medications Added to Medication List This Visit: 1)  Klor-con M20 20 Meq Tbcr  (Potassium chloride crys cr) .... 2 tabs m,w,f,sun and 3 tabs t,th,sat 2)  Lamictal 150 Mg Tabs (Lamotrigine) .... 2 tabs qhs 3)  Wellbutrin Xl 300 Mg Tb24 (Bupropion hcl) .Marland Kitchen.. 1 by mouth qam 4)  Abilify 10 Mg Tabs (Aripiprazole) .Marland Kitchen.. 1 by mouth qd 5)  Vistaril 50 Mg Caps (Hydroxyzine pamoate) .Marland Kitchen.. 1-2 tabs tid 6)  Xanax 0.25 Mg Tabs (Alprazolam)  Other Orders: UA Dipstick w/o Micro (16109)   Patient Instructions: 1)  Follow The Flat Belly Diet ([email or web address]) & walking 30 min @ least 3X/week.LDL goal=<110     Laboratory Results   Urine Tests  Date/Time Recieved: October 27, 2006 2:53 PM  Date/Time Reported: October 27, 2006 2:55 PM   Routine Urinalysis   Appearance: Hazy Glucose: negative   (Normal Range: Negative) Bilirubin: negative   (Normal Range: Negative) Ketone: negative   (Normal Range: Negative) Spec. Gravity: <1.005   (Normal Range: 1.003-1.035) Blood: moderate   (Normal Range: Negative) pH: 5.0   (Normal Range: 5.0-8.0) Protein: negative   (Normal Range: Negative) Urobilinogen: negative   (Normal Range: 0-1) Nitrite: negative   (Normal Range: Negative) Leukocyte Esterace: negative   (  Normal Range: Negative)    Comments: only blood found in urine

## 2010-06-04 NOTE — Letter (Signed)
Summary: [facility] [place]   Imported By: Lanelle Bal 05/28/2008 09:35:25  _____________________________________________________________________  External Attachment:    Type:   Image     Comment:   External Document

## 2010-06-04 NOTE — Assessment & Plan Note (Signed)
Summary: followup on fatigue//kn   Vital Signs:  Patient profile:   55 year old female Weight:      180.6 pounds Temp:     98.6 degrees F oral Pulse rate:   80 / minute Resp:     15 per minute BP sitting:   126 / 78  (left arm) Cuff size:   large  Vitals Entered By: Shonna Chock CMA (November 24, 2009 10:29 AM) CC: Follow-up visit: Ongoing fatigue, no better since last OV, Lipid Management   Primary Care Provider:  Marga Melnick, MD   CC:  Follow-up visit: Ongoing fatigue, no better since last OV, and Lipid Management.  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 No exercising due to ankle issues monitored by Dr Margaretha Sheffield.  June lab results reviewed  Lipid Management History:      Positive NCEP/ATP III risk factors include early menopause without estrogen hormone replacement.  Negative NCEP/ATP III risk factors include female age less than 9 years old, non-diabetic, no family history for ischemic heart disease, non-tobacco-user status, non-hypertensive, no ASHD (atherosclerotic heart disease), no prior stroke/TIA, no peripheral vascular disease, and no history of aortic aneurysm.     Current Medications (verified): 1)  Klor-Con M20 20 Meq Tbcr (Potassium Chloride Crys Cr) .... 2 Tabs Once Daily Except 3 Pills T & Sat 2)  Lamictal 150 Mg  Tabs (Lamotrigine) .... 2 Tabs Qhs 3)  Wellbutrin Xl 150 Mg Xr24h-Tab (Bupropion Hcl) .... 3 By Mouth Once Daily 4)  Vistaril 50 Mg  Caps (Hydroxyzine Pamoate) .Marland Kitchen.. 1-2 Tabs Tid 5)  Ibuprofen 800 Mg Tabs (Ibuprofen) .Marland Kitchen.. 1 Tab By Mouth Q8 Hours 6)  Hyoscyamine Sulfate 0.125 Mg Subl (Hyoscyamine Sulfate) .... Dissolve 1-2 Every 4 Hours As Needed 7)  Ranitidine Hcl 150 Mg Tabs (Ranitidine Hcl) .Marland Kitchen.. 1 Two Times A Day 8)  Miralax   Powd (Polyethylene Glycol 3350) .... Take 1 Dose By Mouth Two Times A Day 9)  Cvs Stool Softener 100 Mg Caps (Docusate  Sodium) .Marland Kitchen.. 1 Qd 10)  Dulcolax 5 Mg Tbec (Bisacodyl) .... Take 1-2 Tablets By Mouth As Needed For Constipation 11)  Xanax (? Dose) .... As Needed  Allergies: 1)  ! Amoxicillin 2)  ! * Fentanyl 3)  ! Nubain 4)  ! Effexor 5)  ! Percodan 6)  ! Percocet 7)  ! Hydrocodone 8)  ! Sulfa 9)  ! Versed 10)  ! * Tessalon Perls 11)  ! * Steri Strips  Review of Systems General:  Denies chills, fever, and sweats; Weight up 5#. Eyes:  Denies blurring, double vision, and vision loss-both eyes. ENT:  Denies difficulty swallowing and hoarseness. CV:  Denies palpitations. Resp:  Complains of hypersomnolence; denies excessive snoring and morning headaches; No PMH of apnea. GI:  Denies constipation; Loose stool 2-3 days. GU:  Denies discharge, dysuria, and hematuria. MS:  Complains of joint pain; denies joint redness and joint swelling; S/P ankle fracture. Derm:  Complains of hair loss; denies changes in nail beds, dryness, lesion(s), and rash. Neuro:  Denies numbness and tingling. Psych:  Dr Tomasa Rand Rxs Lamictal, Wellbutrin, & Xanax. Endo:  Complains of heat intolerance; denies cold intolerance.   Impression & Recommendations:  Problem # 1:  FATIGUE (ICD-780.79)  Orders: Venipuncture (81191) TLB-B12 + Folate Pnl (47829_56213-Y86/VHQ) TLB-IBC Pnl (Iron/FE;Transferrin) (83550-IBC) TLB-Sedimentation Rate (ESR) (85652-ESR)  Problem # 2:  CHANGE IN BOWELS (  ZOX-096.04)  Orders: Venipuncture (54098) TLB-Sedimentation Rate (ESR) (85652-ESR)  Problem # 3:  HYPERLIPIDEMIA NEC/NOS (ICD-272.4)  Complete Medication List: 1)  Klor-con M20 20 Meq Tbcr (Potassium chloride crys cr) .... 2 tabs once daily except 3 pills t & sat 2)  Lamictal 150 Mg Tabs (Lamotrigine) .... 2 tabs qhs 3)  Wellbutrin Xl 150 Mg Xr24h-tab (Bupropion hcl) .... 3 by mouth once daily 4)  Vistaril 50 Mg Caps (Hydroxyzine pamoate) .Marland Kitchen.. 1-2 tabs tid 5)  Ibuprofen 800 Mg Tabs (Ibuprofen) .Marland Kitchen.. 1 tab by mouth q8 hours 6)   Hyoscyamine Sulfate 0.125 Mg Subl (Hyoscyamine sulfate) .... Dissolve 1-2 every 4 hours as needed 7)  Ranitidine Hcl 150 Mg Tabs (Ranitidine hcl) .Marland Kitchen.. 1 two times a day 8)  Miralax Powd (Polyethylene glycol 3350) .... Take 1 dose by mouth two times a day 9)  Cvs Stool Softener 100 Mg Caps (Docusate sodium) .Marland Kitchen.. 1 qd 10)  Dulcolax 5 Mg Tbec (Bisacodyl) .... Take 1-2 tablets by mouth as needed for constipation 11)  Xanax (? Dose)  .... As needed  Lipid Assessment/Plan:      Based on NCEP/ATP III, the patient's risk factor category is "0-1 risk factors".  The patient's lipid goals are as follows: Total cholesterol goal is 200; LDL cholesterol goal is 160; HDL cholesterol goal is 40; Triglyceride goal is 150.  Her LDL cholesterol goal has been met.    Patient Instructions: 1)  Align once daily if stools loose. Review Dr Gildardo Griffes book Eat, Drink & Be Healthy for dietary information on cholesterol.  Appended Document: followup on fatigue//kn PE: no LA or organomegaly. S4 w/o murmur. Skin exam unremarkable.Thyroid normal to palpation.DTRs WNL.No lid lag or tremor.   Appended Document: followup on fatigue//kn

## 2010-06-04 NOTE — Progress Notes (Signed)
Summary: note - dr hopper/ see this please  Phone Note Call from Patient Call back at [phone]   Caller: Patient Summary of Call: patient was seen [phone] said she has bronchities & sinus infection -she works at [employer] & needs a note stating she isnt contagious   Initial call taken by: Okey Regal Spring,  August 10, 2007 2:05 PM  Follow-up for Phone Call        2 notes were created in ERROR. Will forward to Alida to document informing patient. ..................................................................Marland KitchenChrae Malloy  August 10, 2007 5:42 PM   Additional Follow-up for Phone Call Additional follow up Details #1::        PT HAS LAREADY BEEN INFORMED LETTER FOR PICKUP ON THUR 4/0/09...................................................................Marland KitchenKandice Hams  August 14, 2007 9:36 AM  Additional Follow-up by: Kandice Hams,  August 14, 2007 9:36 AM

## 2010-06-04 NOTE — Assessment & Plan Note (Signed)
Summary: ? RIB BROKEN/KDC   Vital Signs:  Patient profile:   55 year old female Height:      64 inches Weight:      175 pounds Temp:     98.2 degrees F oral Pulse rate:   80 / minute BP sitting:   120 / 90  (left arm)  Vitals Entered By: Jeremy Johann CMA (Sep 16, 2008 1:14 PM) CC: bruise or broken ribs while cutting trees x2days   History of Present Illness: 55 yo woman here today for rib pain.  pt was cutting tree down this weekend and used side as leverage.  now w/ R rib soreness.  heard a 'pop'.  taking Advil- 600mg  q4-5 hrs w/ some relief.  very sore w/ hiccups.  able to breathe w/out difficulty.  pt fears she  broke her ribs.  Allergies: 1)  ! Amoxicillin 2)  ! * Fentanyl 3)  ! Nubain 4)  ! Effexor 5)  ! Percodan 6)  ! Percocet 7)  ! Hydrocodone 8)  ! Sulfa 9)  ! Versed 10)  ! * Tessalon Perls  Review of Systems      See HPI  Physical Exam  General:  obviously uncomfortable Chest Wall:  + TTP over R lower ribs along mid axillary line.  no obvious bony deformity Lungs:  Normal respiratory effort, chest expands symmetrically. Lungs are clear to auscultation, no crackles or wheezes. Heart:  Normal rate and regular rhythm. S1 and S2 normal without gallop, murmur, click, rub or other extra sounds.   Impression & Recommendations:  Problem # 1:  RIB PAIN, RIGHT SIDED (ICD-786.50) Assessment New pt w/ possible rib fx on R side but more likely a deep bruise.  check Xray to r/o fx.  no lung involvement on PE.  NSAIDs and pain pills that pt has at home.  reviewed supportive care and red flags that should prompt return.  Pt expresses understanding and is in agreement w/ this plan. Orders: T-Ribs Unilateral 2 Views (71100TC)  Complete Medication List: 1)  Klor-con M20 20 Meq Tbcr (Potassium chloride crys cr) .... 2 tabs m,w,f,sun and 3 tabs t,th,sat 2)  Lamictal 150 Mg Tabs (Lamotrigine) .... 2 tabs qhs 3)  Wellbutrin Xl 300 Mg Tb24 (Bupropion hcl) .Marland Kitchen.. 1 by mouth  qam 4)  Vistaril 50 Mg Caps (Hydroxyzine pamoate) .Marland Kitchen.. 1-2 tabs tid 5)  Spironolactone 25 Mg Tabs (Spironolactone) .Marland Kitchen.. 1 once daily as needed edema 6)  Ibuprofen 800 Mg Tabs (Ibuprofen) .Marland Kitchen.. 1 tab by mouth q8 hours  Patient Instructions: 1)  Please schedule a follow-up appointment as needed or if pain not improving. 2)  We will call you with your xray results- go to [address] 3)  Take the Ibuprofen as directed- do not take extra 4)  Add tylenol as needed for pain 5)  Use a heating pad 6)  If you have pain pills at home you can take these- make note of what they are so we know for the future 7)  Hang in there!  Prescriptions: IBUPROFEN 800 MG TABS (IBUPROFEN) 1 tab by mouth Q8 hours  #60 x 0   Entered and Authorized by:   Neena Rhymes MD   Signed by:   Neena Rhymes MD on 09/16/2008   Method used:   Electronically to        [facility] [address] [id number]* (retail)       [address]       [place]  [place], [place]  [address]       Ph: [phone]       Fax: [phone]   RxID:   [id number]

## 2010-06-04 NOTE — Letter (Signed)
Summary: Out of Work  [facility]  [address] [place], [place] [address]   Phone: [phone]  Fax: [phone]    October 15, 2008   Employee:  TINESHA SIEGRIST    To Whom It May Concern:   For Medical reasons, please excuse the above named employee from work for the following dates:  Start:   10/11/2008  End:   10/15/2008 (return to work date 10/16/2008-Wednesday)  If you need additional information, please feel free to contact our office.         Sincerely,    Chrae Marlynn Perking

## 2010-06-04 NOTE — Consult Note (Signed)
Summary: GI Consult Note/[facility]  GI Consult Note/[facility]   Imported By: Sherian Rein 02/12/2009 10:13:23  _____________________________________________________________________  External Attachment:    Type:   Image     Comment:   External Document

## 2010-06-04 NOTE — Letter (Signed)
Summary: Primary Care Consult Scheduled Letter  [name]  [address] [place], [place] [address]   Phone: [phone]  Fax: [phone]      11/21/2007 MRN: [id number]  Sarah Salazar [address] [place], [place]  [address]    Dear [name],      We have scheduled an appointment for you.  At the recommendation of Dr.Hopper, we have scheduled you a consult with Dr. Merlyn Lot on July 27th at 1:30pm.  Their address is [address] [place], [place] [address]. The office phone number is [phone].  If this appointment day and time is not convenient for you, please feel free to call the office of the doctor you are being referred to at the number listed above and reschedule the appointment.     It is important for you to keep your scheduled appointments. We are here to make sure you are given good patient care. If you have questions or you have made changes to your appointment, please notify us at  [phone], ask for Tiffany.    Thank you,  Patient Care Coordinator  at [facility]

## 2010-06-04 NOTE — Assessment & Plan Note (Signed)
Summary: possible bronchitis//lch   Vital Signs:  Patient profile:   55 year old female Weight:      179.4 pounds BMI:     30.91 Temp:     98.3 degrees F oral Pulse rate:   64 / minute Resp:     15 per minute BP sitting:   124 / 80  (left arm) Cuff size:   large  Vitals Entered By: Shonna Chock (June 17, 2009 9:43 AM) CC: ? Cold, Bronchitis, or Sinus Infection Comments REVIEWED MED LIST, PATIENT AGREED DOSE AND INSTRUCTION CORRECT    Primary Care Provider:  Marga Melnick, MD   CC:  ? Cold, Bronchitis, and or Sinus Infection.  History of Present Illness: Onset 06/12/2009 as "sniffles'" , bitemporal headache & upper chest pressue. Temp to 101.6 that night; better with Dayquil & bedrest. At work  02/14 dry cough recurred. Multiple sick people @ work . Flu shot in 01/2009. No PMH of asthma, but some RAD with infection  Allergies: 1)  ! Amoxicillin 2)  ! * Fentanyl 3)  ! Nubain 4)  ! Effexor 5)  ! Percodan 6)  ! Percocet 7)  ! Hydrocodone 8)  ! Sulfa 9)  ! Versed 10)  ! * Tessalon Perls 11)  ! * Steri Strips  Review of Systems General:  Denies chills, fever, and sweats. ENT:  No frontal headache , facial pain  or purulence. Resp:  Denies chest pain with inspiration, coughing up blood, shortness of breath, sputum productive, and wheezing. Allergy:  Denies itching eyes and sneezing.  Physical Exam  General:  well-nourished,in no acute distress; alert,appropriate and cooperative throughout examination Ears:  External ear exam shows no significant lesions or deformities.  Otoscopic examination reveals clear canals, tympanic membranes are intact bilaterally without bulging, retraction, inflammation or discharge. Hearing is grossly normal bilaterally. TMs scarred Nose:  External nasal examination shows no deformity or inflammation. Nasal mucosa are dry without lesions or exudates. Mouth:  Oral mucosa and oropharynx without lesions or exudates.  Teeth in good  repair. Lungs:  Normal respiratory effort, chest expands symmetrically. Lungs are clear to auscultation, no crackles or wheezes. Dry cough Cervical Nodes:  No lymphadenopathy noted Axillary Nodes:  No palpable lymphadenopathy   Impression & Recommendations:  Problem # 1:  BRONCHITIS-ACUTE (ICD-466.0)  Her updated medication list for this problem includes:    Azithromycin 250 Mg Tabs (Azithromycin) .Marland Kitchen... As per pack  Complete Medication List: 1)  Klor-con M20 20 Meq Tbcr (Potassium chloride crys cr) .... 2 tabs once daily except 3 pills t & sat 2)  Lamictal 150 Mg Tabs (Lamotrigine) .... 2 tabs qhs 3)  Wellbutrin 100 Mg Tabs (Bupropion hcl) .... 3 by mouth once daily 4)  Vistaril 50 Mg Caps (Hydroxyzine pamoate) .Marland Kitchen.. 1-2 tabs tid 5)  Spironolactone 25 Mg Tabs (Spironolactone) .Marland Kitchen.. 1 once daily as needed edema 6)  Ibuprofen 800 Mg Tabs (Ibuprofen) .Marland Kitchen.. 1 tab by mouth q8 hours 7)  Hyoscyamine Sulfate 0.125 Mg Subl (Hyoscyamine sulfate) .... Dissolve 1-2 every 4 hours as needed 8)  Ranitidine Hcl 150 Mg Tabs (Ranitidine hcl) .Marland Kitchen.. 1 two times a day 9)  Miralax Powd (Polyethylene glycol 3350) .... Take 1 dose by mouth two times a day 10)  Cvs Stool Softener 100 Mg Caps (Docusate sodium) .Marland Kitchen.. 1 qd 11)  Dulcolax 5 Mg Tbec (Bisacodyl) .... Take 1-2 tablets by mouth as needed for constipation 12)  Azithromycin 250 Mg Tabs (Azithromycin) .... As per pack  Patient Instructions: 1)  Robitussin DM as needed for cough. 2)  Drink as much fluid as you can tolerate for the next few days. QVAR 2 puffs every 12 hrs ; gargle & spit after use. 3)  Recommended remaining out of work for 06/17/2009 Prescriptions: AZITHROMYCIN 250 MG TABS (AZITHROMYCIN) as per pack  #1 x 0   Entered and Authorized by:   Marga Melnick MD   Signed by:   Marga Melnick MD on 06/17/2009   Method used:   Faxed to ...       [id number]* (retail)       [address] [place]       [place], [place]  [address]       Ph: [phone]       Fax: [phone]   RxID:   [id number]

## 2010-06-04 NOTE — Progress Notes (Signed)
Summary: ** Recent Labs Results**  Phone Note Outgoing Call Call back at Home Phone [phone]   Call placed by: Shonna Chock,  October 16, 2008 9:40 AM Call placed to: Patient Summary of Call: Left message on machine for patient to return call when avaliable, Reason for call:   2 liver enzymes up mildly; avoid excess Tylenol ( > 6/day), vitamin A, alcohol & all foods /drinks with High Fructose Corn Syrup as #1,2 or #3 on label(HFCS raises Triglycerides which can affect liver enzymes). White count low suggesting recent virus. No sign of bacterial gastritis or pancreatitis. Recheck fasting hepatic panel in 1 week after above changes.(790.4).  [name]  October 16, 2008 9:40 AM   Follow-up for Phone Call        LEFT MESSAGE ON MACHINE WITH RESULTS PER PATIENT'S REQUEST. PATIENT TO CALL BACK TO SCHEDULE APPOINTMENT FOR FASTING HEPATIC APPOINTMENT  Follow-up by: Shonna Chock,  October 16, 2008 11:19 AM  Additional Follow-up for Phone Call Additional follow up Details #1::        PATIENT LEFT MESSAGE ON MACHINE ON VOICEMAIL: PATIENT WOULD LIKE TO KNOW FOODS HIGH IN VIT A.  I LEFT MESSAGE ON MACHINE INFORMING PATIENT I LOOKED UP FOODS HIGH IN VIT-A AND WILL MAIL HER A COPY OF OR SHE CAN GOOGLE FOODS HIGH IN VIT-A Additional Follow-up by: Shonna Chock,  October 17, 2008 9:33 AM

## 2010-06-04 NOTE — Procedures (Signed)
Summary: Colonoscopy  Colonoscopy   Imported By: Francee Piccolo CMA (AAMA) 02/07/2009 14:15:37  _____________________________________________________________________  External Attachment:    Type:   Image     Comment:   External Document

## 2010-06-04 NOTE — Progress Notes (Signed)
Summary: Flu  Phone Note Call from Patient   Caller: Patient Call For: Hopper Reason for Call: Acute Illness Summary of Call: pt called in with symptoms of flu, fever 103, body aches, runny nose wanted to know what to do ....Marland KitchenMarland KitchenDr. Laury Axon took call and rx Tamiflu Initial call taken by: Windell Norfolk,  March 02, 2008 11:30 AM  Follow-up for Phone Call        tamiflu called in to pharmacy and pt instructed to con't advil and robitussin and call or go to UC or ER with worsening symptoms. Follow-up by: Loreen Freud DO,  March 02, 2008 11:35 AM    New/Updated Medications: TAMIFLU 75 MG CAPS (OSELTAMIVIR PHOSPHATE) 1 by mouth two times a day   Prescriptions: TAMIFLU 75 MG CAPS (OSELTAMIVIR PHOSPHATE) 1 by mouth two times a day  #10 x 0   Entered by:   Windell Norfolk   Authorized by:   Loreen Freud DO   Signed by:   Windell Norfolk on 03/02/2008   Method used:   Electronically to        [facility] [id number]* (retail)       [address]       [place], [place]  [address]       Ph: [phone]       Fax: [phone]   RxID:   [id number]

## 2010-06-04 NOTE — Miscellaneous (Signed)
Summary: Orders Update   Clinical Lists Changes  Orders: Added new Referral order of Physical Therapy Referral (PT) - Signed 

## 2010-06-04 NOTE — Progress Notes (Signed)
  Phone Note Call from Patient Call back at [facility] Phone [phone]   Caller: Patient Summary of Call: patient requested another note excusing from work today, okay per Dr. Laury Axon and faxed to [phone] per patient [name]  March 06, 2008 12:16 PM

## 2010-06-04 NOTE — Assessment & Plan Note (Signed)
Summary: BACK PAIN & UPPER RT OF ABDOMIN/CBS   Vital Signs:  Patient profile:   55 year old female Weight:      170.8 pounds BMI:     29.42 Temp:     98.2 degrees F oral Pulse rate:   76 / minute Resp:     15 per minute BP sitting:   122 / 78  (left arm) Cuff size:   large  Vitals Entered By: Shonna Chock (October 14, 2008 1:54 PM) CC: LOWER BACK PAIN AND RIGHT SIDE ABDOMINAL PAIN SINCE LAST THURSDAY Comments REVIEWED MED LIST, PATIENT AGREED DOSE AND INSTRUCTION CORRECT    Primary Care Provider:  Laury Axon  CC:  LOWER BACK PAIN AND RIGHT SIDE ABDOMINAL PAIN SINCE LAST THURSDAY.  History of Present Illness: Onset as central LS  area pain 10/10/08; hyoscyamine called in  helped some. This had been Rxed for similar symptoms of "spasms " in intestines in past .  PM H of Crohn's vs IBS.  Allergies: 1)  ! Amoxicillin 2)  ! * Fentanyl 3)  ! Nubain 4)  ! Effexor 5)  ! Percodan 6)  ! Percocet 7)  ! Hydrocodone 8)  ! Sulfa 9)  ! Versed 10)  ! * Tessalon Perls  Past History:  Past Medical History: colon problem--IBS vs crohns as  per patient  Past Surgical History: Cholecystectomy - 06/13/2002, d&C--x 3 -, knee surgery x 2 -, l. wrist surgery x 3 -, laparoscopy - 06/03/1990, lumpectomy l. breast - 06/03/1978; Endo X2 : neg; Colonoscopy negative X 2  Review of Systems General:  Complains of sweats; denies chills, fever, and weight loss; Menopausal sweats.Marland Kitchen GI:  Complains of abdominal pain, change in bowel habits, and indigestion; denies bloody stools, dark tarry stools, nausea, and vomiting; ROQ constant hard pain. Zantac 75 OTC  helps dyspepsia. Alternates between soft stool & diarrhea. Also suprapubic pain pre BM; better post BM. GU:  Denies discharge, dysuria, and hematuria.  Physical Exam  General:  well-nourished,in no acute distress; alert,appropriate and cooperative throughout examination Eyes:  No corneal or conjunctival inflammation noted. Perrla.No icterus Mouth:  Oral mucosa  and oropharynx without lesions or exudates.  Teeth in good repair. No pharyngeal erythema.   Lungs:  Normal respiratory effort, chest expands symmetrically. Lungs are clear to auscultation, no crackles or wheezes. Heart:  Normal rate and regular rhythm. S1 and S2 normal without gallop, murmur, click, rub . S4 Abdomen:  Bowel sounds positive,abdomen soft  but mildly tender  in epigastrium without masses, organomegaly or hernias noted. Msk:  sat up w/o help Pulses:  R and L carotid,radial,dorsalis pedis and posterior tibial pulses are full and equal bilaterally Extremities:  No clubbing, cyanosis, edema, or deformity noted with normal full range of motion of all joints.   Neurologic:  alert & oriented X3, strength normal in lower extremities, and DTRs symmetrical and normal.   Skin:  Intact without suspicious lesions or rashes. No jaundice Cervical Nodes:  No lymphadenopathy noted Axillary Nodes:  No palpable lymphadenopathy Psych:  memory intact for recent and remote, normally interactive, and good eye contact.     Impression & Recommendations:  Problem # 1:  LOW BACK PAIN, ACUTE (ICD-724.2)  Her updated medication list for this problem includes:    Ibuprofen 800 Mg Tabs (Ibuprofen) .Marland Kitchen... 1 tab by mouth q8 hours  Problem # 2:  ABDOMINAL PAIN, RECURRENT (ICD-789.00)  RUQ & suprapubic  Orders: TLB-Hepatic/Liver Function Pnl (80076-HEPATIC) TLB-CBC Platelet - w/Differential (85025-CBCD) TLB-Amylase (82150-AMYL) TLB-Lipase (  83690-LIPASE) TLB-H. Pylori Abs(Helicobacter Pylori) (86677-HELICO)  Problem # 3:  IRRITABLE BOWEL SYNDROME, HX OF (ICD-V12.79) vs #4  Problem # 4:  CROHN'S DISEASE (ICD-555.9) ? diagnosis  Complete Medication List: 1)  Klor-con M20 20 Meq Tbcr (Potassium chloride crys cr) .... 2 tabs m,w,f,sun and 3 tabs t,th,sat 2)  Lamictal 150 Mg Tabs (Lamotrigine) .... 2 tabs qhs 3)  Wellbutrin Xl 300 Mg Tb24 (Bupropion hcl) .Marland Kitchen.. 1 by mouth qam 4)  Vistaril 50 Mg Caps  (Hydroxyzine pamoate) .Marland Kitchen.. 1-2 tabs tid 5)  Spironolactone 25 Mg Tabs (Spironolactone) .Marland Kitchen.. 1 once daily as needed edema 6)  Ibuprofen 800 Mg Tabs (Ibuprofen) .Marland Kitchen.. 1 tab by mouth q8 hours 7)  Hyoscyamine Sulfate 0.125 Mg Subl (Hyoscyamine sulfate) .Marland Kitchen.. 1 sl every 4 hours as needed 8)  Ranitidine Hcl 150 Mg Tabs (Ranitidine hcl) .Marland Kitchen.. 1 two times a day  Other Orders: UA Dipstick w/o Micro (manual) ([address]) T-Culture, Urine ([id number])  Patient Instructions: 1)  Align once daily for BM changes. Complete stool cards. Prescriptions: RANITIDINE HCL 150 MG TABS (RANITIDINE HCL) 1 two times a day  #60 x 1   Entered and Authorized by:   Marga Melnick MD   Signed by:   Marga Melnick MD on 10/14/2008   Method used:   Print then Give to Patient   RxID:   [id number]   Laboratory Results   Urine Tests    Routine Urinalysis   Color: straw Appearance: Clear Glucose: negative   (Normal Range: Negative) Bilirubin: negative   (Normal Range: Negative) Ketone: negative   (Normal Range: Negative) Spec. Gravity: <1.005   (Normal Range: 1.003-1.035) Blood: negative   (Normal Range: Negative) pH: 7.0   (Normal Range: 5.0-8.0) Protein: negative   (Normal Range: Negative) Urobilinogen: 0.2   (Normal Range: 0-1) Nitrite: negative   (Normal Range: Negative) Leukocyte Esterace: moderate   (Normal Range: Negative)

## 2010-06-04 NOTE — Letter (Signed)
Summary: Out of Work  [facility] [employer]  [address] [place], [place] [address]   Phone: [phone]  Fax: [phone]    March 06, 2008   Employee:  Sarah Salazar    To Whom It May Concern:   For Medical reasons, please excuse the above named employee from work for the following dates:  Start: March 04, 2008  End:  March 06, 2008, may return to work March 07, 2008  If you need additional information, please feel free to contact our office.         Sincerely,    Loreen Freud, DO  Appended Document: Out of Work Faxed to [phone] per patient request. MU,CMA

## 2010-06-04 NOTE — Progress Notes (Signed)
Summary: Pain  Phone Note Call from Patient Call back at [phone]   Summary of Call: MESSAGE LEFT ON VOICEMAIL: PATIENT WAS RECENTLY SEEN FOR UTI AND DIARRHEA. PATIENT WITH LOW-BACK PAIN AND IS UNABLE TO DO THERAPY @ THIS TIME. PATIENT SAID PRESCRIBED STRENGTH ADVIL IS NOT HELPING. DARVOCET BARLEY TAKES THE EDGE OFF. PATIENT WOULD LIKE TO KNOW WHAT SHE CAN DO ABOUT THIS PAIN.  DR.HOPPER PLEASE ADVISE./[name]  October 24, 2008 4:42 PM   Follow-up for Phone Call        see new Rx for Darvocet N100 generic ; it was only one not on list of allergies .Please confirm that before FAXing Rx. I'll refer her to Ortho or Chiropractor if unable to do PT Follow-up by: Marga Melnick MD,  October 24, 2008 5:44 PM  Additional Follow-up for Phone Call Additional follow up Details #1::        PT STATES THAT SHE WILL TRY THE PT FIRST. to see how it does........Marland KitchenFelecia Deloach CMA  October 25, 2008 9:01 AM    Additional Follow-up for Phone Call Additional follow up Details #2::    Patient feels this is not muscle pain, this is something else, still has lower back pain, patient feels it is her intestines, gets some relief when she has a bowel movement.  However, she is still having back pain. Patient would like to be seen on Monday if not any better, will pick up new rx for darvocet and try this until Monday. [name]  October 25, 2008 9:08 AM  Follow-up by: [name],  October 25, 2008 9:08 AM  Additional Follow-up for Phone Call Additional follow up Details #3:: Details for Additional Follow-up Action Taken: noted Additional Follow-up by: Marga Melnick MD,  October 25, 2008 1:55 PM  New/Updated Medications: PROPOXYPHENE N-APAP 100-650 MG TABS (PROPOXYPHENE N-APAP) 1 -2 q 6 hrs prn   Prescriptions: PROPOXYPHENE N-APAP 100-650 MG TABS (PROPOXYPHENE N-APAP) 1 -2 q 6 hrs prn  #30 x 1   Entered by:   Jeremy Johann CMA   Authorized by:   Marga Melnick MD   Signed by:   Jeremy Johann CMA on  10/25/2008   Method used:   Printed then faxed to ...       [facility] [address] [id number] (retail)       [address]       [place], [place]  [address]       Ph: [phone]       Fax: [phone]   RxID:   [id number]

## 2010-06-04 NOTE — Miscellaneous (Signed)
Summary: Immunization Entry   Influenza Immunization History:    Influenza # 1:  Historical (01/23/2008) Given @ [facility] [address] LEFT DELTOID. LOT: [id number].Sarah Salazar  March 12, 2008 5:03 PM

## 2010-06-04 NOTE — Letter (Signed)
Summary: Results Follow up Letter  [facility] [place]  [address] [place], [place] [address]   Phone: [phone]  Fax: [phone]    10/18/2008 MRN: [id number]  Sarah Salazar [address] [place], [place]  [address]  Dear Ms. Sarah Salazar,  The following are the results of your recent test(s):  Test         Result    Pap Smear:        Normal _____  Not Normal _____ Comments: ______________________________________________________ Cholesterol: LDL(Bad cholesterol):         Your goal is less than:         HDL (Good cholesterol):       Your goal is more than: Comments:  ______________________________________________________ Mammogram:        Normal _____  Not Normal _____ Comments:  ___________________________________________________________________ Hemoccult:        Normal _X____  Not normal _______ Comments:    _____________________________________________________________________ Other Tests:    We routinely do not discuss normal results over the telephone.  If you desire a copy of the results, or you have any questions about this information we can discuss them at your next office visit.   Sincerely,

## 2010-06-04 NOTE — Op Note (Signed)
Summary: [facility]  [facility]   Imported By: Lanelle Bal 12/18/2008 09:55:39  _____________________________________________________________________  External Attachment:    Type:   Image     Comment:   External Document

## 2010-06-04 NOTE — Procedures (Signed)
Summary: EGD:  Esophagitis, Stricture, Gastritis   EGD  Procedure date:  09/27/2002  Findings:      Findings: Esophagitis  Findings: Gastritis  Findings: Stricture:  Location: [facility]   Patient Name: Sarah Salazar, Guallpa MRN: [id number] Procedure Procedures: Panendoscopy (EGD) CPT: 43235.    with esophageal dilation. CPT: G9296129.  Personnel: Endoscopist: Ulyess Mort, MD.  Exam Location: Exam performed in Endoscopy Suite.  Patient Consent: Procedure, Alternatives, Risks and Benefits discussed, consent obtained,  Indications Symptoms: Dysphagia. Dyspepsia,  History  Pre-Exam Physical: Performed Sep 27, 2002  Cardio-pulmonary exam, HEENT exam, Abdominal exam, Extremity exam, Mental status exam WNL.  Exam Exam Info: Maximum depth of insertion Duodenum, intended Duodenum. Vocal cords visualized. Gastric retroflexion was not performed. Images were not taken. ASA Classification: II. Tolerance: good.  Sedation Meds: Droperidol 1.25 mg. given IV. Demerol 100 mg. given IV.  Monitoring: BP and pulse monitoring done. Oximetry used. Supplemental O2 given  Fluoroscopy: Fluoroscopy was not used.  Findings - STRICTURE / STENOSIS: Distal Esophagus.  Constriction: partial. Etiology: lower esophageal ring. ICD9: Esophageal Stricture: 530.3. Comment: increased 3 waves.  - Dilation: Duodenal Bulb. Maloney dilator used, Diameter: 52 mm, Minimal Resistance, No Heme present on extraction. Patient tolerance good.  - HIATAL HERNIA: 3 cms. in length. ICD9: Esophagitis: 530.10.Comments: mild esopagitis.  - MUCOSAL ABNORMALITY: Duodenal Bulb to Jejunum. Granular mucosa.  - MUCOSAL ABNORMALITY: Body to Antrum. Erythematous mucosa. Granular mucosa. ICD9: Gastritis, Chronic: 535.10. Comment: mild.   Assessment Abnormal examination, see findings above.  Diagnoses: 530.10: Esophagitis.  530.3: Esophageal Stricture.  535.10: Gastritis, Chronic.   Events  Unplanned  Intervention: No unplanned interventions were required.  Unplanned Events: There were no complications. Plans Medication(s): Continue current medications. PPI: Esomeprazole/Nexium 40 mg   Patient Education: Patient given standard instructions for: Hiatal Hernia.  Disposition: After procedure patient sent to recovery.   This report was created from the original endoscopy report, which was reviewed and signed by the above listed endoscopist.

## 2010-06-04 NOTE — Letter (Signed)
Summary: Results Follow up Letter  [facility] [place]  [address] [place], [place] [address]   Phone: [phone]  Fax: [phone]    10/16/2008 MRN: [id number]  Sarah Salazar [address] [place], [place]  [address]  Dear [name],  The following are the results of your recent test(s):  Test         Result    Pap Smear:        Normal _____  Not Normal _____ Comments: ______________________________________________________ Cholesterol: LDL(Bad cholesterol):         Your goal is less than:         HDL (Good cholesterol):       Your goal is more than: Comments:  ______________________________________________________ Mammogram:        Normal _____  Not Normal _____ Comments:  ___________________________________________________________________ Hemoccult:        Normal _____  Not normal _______ Comments:    _____________________________________________________________________ Other Tests: PLEASE SEE ATTACHED LABS DONE ON 10/14/2008    We routinely do not discuss normal results over the telephone.  If you desire a copy of the results, or you have any questions about this information we can discuss them at your next office visit.   Sincerely,

## 2010-06-04 NOTE — Letter (Signed)
Summary: Generic Letter  [facility] [place]  [address] [place], [place] [address]   Phone: [phone]  Fax: [phone]          08/10/07    Lessie Dings [address] [place], [place]  [address]     To Whom It May Concern:  Ms. Niese does not have a communicable disease.      Sincerely,    Titus Dubin. Alwyn Ren, M.D. [facility]

## 2010-06-04 NOTE — Progress Notes (Signed)
Summary: Wellbutrin Update  Phone Note Call from Patient Call back at Work Phone [phone]   Caller: Patient Summary of Call: PATIENT CALLED STATED ON HER MED SHEET IT HAD 300MG  WELLBUTRIN  ONCE A DAY. PATIENT STATES WHAT SHE TAKES IS 350MG  TABLETS OF WELLBUTIN. PATIENT STaTES SHE WAS TOLD TO CALL BACK AND LET THE NURSE KNOW Initial call taken by: Barb Merino,  June 18, 2009 4:07 PM  Follow-up for Phone Call        Wellbutrin doesn't come in a 350mg  tab. I called patient and left message for her to check her med bottle and see what dose is on her tablets(what is the actual MG) and how many she takes a day and to call and let me know. Follow-up by: Shonna Chock,  June 19, 2009 11:23 AM  Additional Follow-up for Phone Call Additional follow up Details #1::        Patient called to say that she said she is taking 3 of the 150mg  tab daily.   Updated on med list Additional Follow-up by: Shonna Chock,  June 19, 2009 4:08 PM    New/Updated Medications: WELLBUTRIN XL 150 MG XR24H-TAB (BUPROPION HCL) 3 by mouth once daily

## 2010-06-04 NOTE — Assessment & Plan Note (Signed)
Summary: bp up/cbs   Vital Signs:  Patient Profile:   55 Years Old Female Pulse rate:   72 / minute Resp:     17 per minute BP sitting:   118 / 72  (left arm) Cuff size:   large  Vitals Entered By: Shonna Chock (November 20, 2007 2:15 PM)                 Chief Complaint:  1.)  PATIENT WAS IN THE HOSPITAL AND B/P WAS ELEVATED 2.) SWELLING IN LEGS/ANKLES.  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  She notes no problems with any antihypertensive medication side effects.  Further comments include: In hospital  ER 151/98 on 719/09; seen for finger injury.        Positive major cardiovascular risk factors include hyperlipidemia.  Negative major cardiovascular risk factors include female age less than 1 years old and non-tobacco-user status.       Current Allergies (reviewed today): ! AMOXICILLIN ! * FENTANYL ! NUBAIN ! EFFEXOR ! PERCODAN ! PERCOCET ! HYDROCODONE ! SULFA ! VERSED ! * TESSALON PERLS  Past Surgical History:    Cholecystectomy - 06/13/2002, d&C--x 3 -, knee surgery x 2 -, l. wrist surgery x 3 -, laparoscopy - 06/03/1990, lumpectomy l. breast - 06/03/1978    Risk Factors:  Exercise:  yes    Times per week:  5    Type:  WALKING    Review of Systems  Eyes      Denies blurring, double vision, and vision loss-both eyes.  Neuro      Denies numbness and tingling.   Physical Exam  General:     Well-developed,well-nourished,in no acute distress; alert,appropriate and cooperative throughout examination Heart:     Normal rate and regular rhythm. S1 and S2 normal without gallop, murmur, click, rub or other extra sounds. Pulses:     R and L  carotid,radial and posterior tibial pulses are full and equal bilaterally. Decreased DPP Extremities:     trace left pedal edema and trace right pedal edema. Edema of R hand; 3rd & 2nd fingers wrapped     Impression & Recommendations:  Problem # 1:  EDEMA- LOCALIZED (ICD-782.3)  Her updated medication list for this problem includes:    Spironolactone 25 Mg Tabs (Spironolactone) .Marland Kitchen... 1 once daily as needed edema   Problem # 2:  INJURY, OTHER NOS (ICD-959.9)  Orders: Orthopedic Referral (Ortho)   Complete Medication List: 1)  Klor-con M20 20 Meq Tbcr (Potassium chloride crys cr) .... 2 tabs m,w,f,sun and 3 tabs t,th,sat 2)  Lamictal 150 Mg Tabs (Lamotrigine) .... 2 tabs qhs 3)  Wellbutrin Xl 300 Mg Tb24 (Bupropion hcl) .Marland Kitchen.. 1 by mouth qam 4)  Vistaril 50 Mg Caps (Hydroxyzine pamoate) .Marland Kitchen.. 1-2 tabs tid 5)  Spironolactone 25 Mg Tabs (Spironolactone) .Marland Kitchen.. 1 once daily as needed edema  Hypertension Assessment/Plan:      The patient's hypertensive risk group is category B: At least one risk factor (excluding diabetes) with no target organ damage.  Today's blood pressure is 118/72.     Patient Instructions: 1)  Limit your Sodium (Salt) to less than 4 grams a day (slightly less than 1 teaspoon) to prevent  fluid retention, swelling, or worsening or symptoms. 2)  Recommended remaining out of work for  11/20/07.   Prescriptions: SPIRONOLACTONE 25 MG  TABS (SPIRONOLACTONE) 1 once daily as needed edema  #30 x 0   Entered and Authorized by:   Marga Melnick MD   Signed by:   Marga Melnick MD on 11/20/2007   Method used:   Print then Give to Patient   RxID:   [id number]  ]

## 2010-06-04 NOTE — Progress Notes (Signed)
Summary: Urine Culture Results  Phone Note Outgoing Call Call back at [facility] Phone [phone]   Call placed by: Shonna Chock,  October 17, 2008 12:48 PM Call placed to: Patient Summary of Call: Left message on machine informing patient : Significant UTI present; allergic to Sulfa & PCN. Generic Macrobid #20., the reason I left this information me and Dr.Hopper leaving early today.  RX sent to [facility], patient to call if any questions or concerns./Chrae [facility]  October 17, 2008 12:49 PM

## 2010-06-04 NOTE — Assessment & Plan Note (Signed)
Summary: IBS   History of Present Illness Visit Type: new patient  Primary GI MD: Stan Head MD [facility] Primary Provider: Marga Melnick, MD  Requesting Provider: n/a Chief Complaint: diarrhea, and constipation History of Present Illness:   Having problems with alternating bowel habits, swinging from constipation to iarrhea. The diarrhea can be very urgent  She tends towards constipation. There can be significant bloating and abdominal distention at times that is unpredictable and not related to size or contet of the eal. Abdomen can also be sore at times and tender to touch. She believes she is more constipated than with diarrhea, usually moveds her bowels about 1x/week. 1-2 x a month she will have the diarrhea. She has had incontinence at times. She uses hyoscyamine for cramps with success but has not tried for the diarrhea that is urgent. Has started MiraLax and stool softener after her cystocele repair about 3 weeks ago but not making a difference in her defecation pattern.  Ranitidine is controlling heartburn at this time.  Hoping to et relief prior to returning to work as she is out on short-term disability.   GI Review of Systems    Reports acid reflux and  heartburn.      Denies abdominal pain, belching, bloating, chest pain, dysphagia with liquids, dysphagia with solids, loss of appetite, nausea, vomiting, vomiting blood, weight loss, and  weight gain.      Reports change in bowel habits, constipation, and  diarrhea.     Denies anal fissure, black tarry stools, diverticulosis, fecal incontinence, heme positive stool, hemorrhoids, irritable bowel syndrome, jaundice, light color stool, liver problems, rectal bleeding, and  rectal pain.    Current Medications (verified): 1)  Klor-Con M20 20 Meq Tbcr (Potassium Chloride Crys Cr) .... 2 Tabs M,w,f,sun and 3 Tabs T,th,sat 2)  Lamictal 150 Mg  Tabs (Lamotrigine) .... 2 Tabs Qhs 3)  Wellbutrin Xl 300 Mg  Tb24 (Bupropion Hcl) .Marland Kitchen.. 1 By  Mouth Qam 4)  Vistaril 50 Mg  Caps (Hydroxyzine Pamoate) .Marland Kitchen.. 1-2 Tabs Tid 5)  Spironolactone 25 Mg  Tabs (Spironolactone) .Marland Kitchen.. 1 Once Daily As Needed Edema 6)  Ibuprofen 800 Mg Tabs (Ibuprofen) .Marland Kitchen.. 1 Tab By Mouth Q8 Hours 7)  Hyoscyamine Sulfate 0.125 Mg Subl (Hyoscyamine Sulfate) .Marland Kitchen.. 1 Sl Every 4 Hours As Needed 8)  Ranitidine Hcl 150 Mg Tabs (Ranitidine Hcl) .Marland Kitchen.. 1 Two Times A Day 9)  Propoxyphene N-Apap 100-650 Mg Tabs (Propoxyphene N-Apap) .Marland Kitchen.. 1 -2 Q 6 Hrs Prn 10)  Darvocet-N 100 100-650 Mg Tabs (Propoxyphene N-Apap) .... Take One Tablet Every 4-6 Hours As Needed For Pain 11)  Miralax   Powd (Polyethylene Glycol 3350) .Marland Kitchen.. 1 Dose Daily 12)  [facility] Stool Softener 100 Mg Caps (Docusate Sodium) .Marland Kitchen.. 1 Qd  Allergies (verified): 1)  ! Amoxicillin 2)  ! * Fentanyl 3)  ! Nubain 4)  ! Effexor 5)  ! Percodan 6)  ! Percocet 7)  ! Hydrocodone 8)  ! Sulfa 9)  ! Versed 10)  ! Sondra Come  Past History:  Past Medical History: IBS  Anxiety Disorder Depression Hypothyroidism Urinary Tract Infection  Past Surgical History: Cholecystectomy - 06/13/2002, D&C x 3 , knee surgery x 2 -, l. wrist surgery x 3 -, laparoscopy - 06/03/1990, lumpectomy l. breast - 06/03/1978; Endo X2 : neg; Colonoscopy negative X 2 cystocele repair 01/13/09 - [facility]  Family History: breast ca, tia--mom, cad, lung ca--dad Family History of Heart Disease: dad, and mom  Family History of Colon Cancer:Paternal  Uncle  Family History of Stomach Cancer:Paternal Uncle  Social History: Lives alone, divorced, daughter at [employer].; No smoking or alcohol; Not currently employed Occupation: Clinical biochemist  Daily Caffeine Use: One daily   Review of Systems       The patient complains of back pain, fatigue, thirst - excessive, and urine leakage.         All other ROS negative except as per HPI. Back pain has been helped by cystocele repair, also helped prior pelvic pain  Vital Signs:  Patient profile:   55  year old female Height:      64 inches Weight:      175 pounds BMI:     30.15 BSA:     1.85 Pulse rate:   72 / minute Pulse rhythm:   regular BP sitting:   128 / 82  (left arm) Cuff size:   regular  Vitals Entered By: Ok Anis CMA (February 10, 2009 2:12 PM)  Physical Exam  General:  well-nourished,in no acute distress; alert,appropriate and cooperative throughout examination Eyes:  No corneal or conjunctival inflammation noted. Perrla.No icterus Mouth:  Oral mucosa and oropharynx without lesions or exudates.  Teeth in good repair. No pharyngeal erythema.   Neck:  No deformities, masses, or tenderness noted. Lungs:  Clear throughout to auscultation. Heart:  Regular rate and rhythm; no murmurs, rubs,  or bruits. Abdomen:  Soft, BS+ mildly tender RLQ without masses no HSM or mass Extremities:  no edema onychomycotic changes in toenails Neurologic:  Alert and  oriented x4;  Cervical Nodes:  No significant cervical or supraclavicular adenopathy.  Psych:  Alert and cooperative. Normal mood and affect.   Impression & Recommendations:  Problem # 1:  IBS (ICD-564.1) Assessment Deteriorated Her problems seem classic for that and she has had these issues chronically. at times has had more diarrhea than constipation. Will try increasing MiraLax to 2 once daily and using intermittent DulcoLax. 4 weeks of Align provided also (she has financial difficulty at this time, to return to work soon. She is to try hyoscyamine to see if it helps infrequent urgent diarrhea. Fortunately she is much better overall with respect to back and pelvic pain after cystocele repair.  Problem # 2:  SCREENING COLORECTAL-CANCER (ICD-V76.51) Assessment: Comment Only She is essentially in the range to [facility] a screening colonoscopy as last exam (for diarrhea) was in her early 29's. SHE REQUIRES PROPOFOL SEDATION, so we will see what dates are available. She may postpone it til 2011 due to difficulty finding time  off work.  Problem # 3:  PERSONAL HISTORY OF FAILED MODERATE SEDATION (ICD-V15.80) Assessment: Comment Only She does not tolerate endoscopic procedures with moderate sedation.  Patient Instructions: 1)  Use Miralax 2 times a day. 2)  If no bowel movement after 2-3 days, use a dulcolax tablet (1-2) to promote defecation. 3)  Try hyoscyamine 1-2 when you get the urgent defecaton/diarrhea problems. 4)  We will call you with some potential dates for colonoscopy with deep sedation (propofol).  5)  Take the Align x 1 month. 6)  Follow-up as needed if this is not successful. 7)  Copy sent to : Levan Hurst, MD 8)  The medication list was reviewed and reconciled.  All changed / newly prescribed medications were explained.  A complete medication list was provided to the patient / caregiver.

## 2010-06-04 NOTE — Assessment & Plan Note (Signed)
Summary: fatigue/cbs   Vital Signs:  Patient profile:   55 year old female Weight:      178.2 pounds O2 Sat:      97 % Temp:     98.4 degrees F oral Pulse rate:   91 / minute Resp:     16 per minute BP sitting:   130 / 78  (left arm) Cuff size:   large  Vitals Entered By: Shonna Chock (October 23, 2009 9:59 AM) CC: Fatigue x 1-2 months    Primary Care Provider:  Marga Melnick, MD   CC:  Fatigue x 1-2 months .  History of Present Illness:  Fatigue      This is a 55 year old woman who presents with Fatigue, present for > 6 months, progressive over past 2 months.  The patient reports persistent fatigue, fatigue even without physical activity, primarily physical fatigue.  The patient denies fever, night sweats, weight loss, exertional chest pain, dyspnea, cough, hemoptysis, and new medications.  Other symptoms include daytime sleepiness. No PMH of apnea. The patient denies the following symptoms: leg swelling, orthopnea, PND, melena, adenopathy, severe snoring, and skin changes.  Other  symptoms include decreased  appetite and poor sleep, &awakening with chills.  The patient denies feeling depressed.  Dr Tomasa Rand  is prescribing  Lamictil, Wellbutrin , Vistaril & Xanax.   Allergies: 1)  ! Amoxicillin 2)  ! * Fentanyl 3)  ! Nubain 4)  ! Effexor 5)  ! Percodan 6)  ! Percocet 7)  ! Hydrocodone 8)  ! Sulfa 9)  ! Versed 10)  ! * Tessalon Perls 11)  ! * Steri Strips  Review of Systems General:  Denies sweats. Eyes:  Denies blurring, double vision, and vision loss-both eyes. ENT:  Denies difficulty swallowing and hoarseness; No oral lesions. CV:  Denies palpitations. Resp:  Denies morning headaches. GI:  Denies diarrhea; Alternating constipation  & loose stool. Derm:  Denies changes in nail beds, lesion(s), and rash; Cyclical hair loss. Neuro:  Denies brief paralysis, poor balance, tingling, and weakness. Psych:  Denies anxiety, easily angered, easily tearful, and  irritability. Endo:  Complains of cold intolerance; denies excessive hunger, excessive thirst, excessive urination, and heat intolerance. Allergy:  Denies hives or rash; No angioedema.  Physical Exam  General:  well-nourished,in no acute distress; alert,appropriate and cooperative throughout examination Head:  Normocephalic and atraumatic without obvious abnormalities. No apparent alopecia . Eyes:  No corneal or conjunctival inflammation noted. EOMI. Perrla. Field of  Vision grossly normal.No icterus or lid lag Mouth:  Oral mucosa and oropharynx without lesions or exudates.  Teeth in good repair.No pharyngeal crowding Neck:  No deformities, masses, or tenderness noted.Thyroid small &sl irregular w/o nodules Lungs:  Normal respiratory effort, chest expands symmetrically. Lungs are clear to auscultation, no crackles or wheezes. Heart:  Normal rate and regular rhythm. S1 and S2 normal without gallop, murmur, click, rub.S4 with slurring Abdomen:  Bowel sounds positive,abdomen soft and non-tender without masses, organomegaly or hernias noted. Pulses:  R and L carotid,radial and posterior tibial pulses are full and equal bilaterally. Minimally decreased DPP w/o ischemia Extremities:  No clubbing, cyanosis, edema, or deformity noted with normal full range of motion of all joints.   Neurologic:  alert & oriented X3, cranial nerves II-XII intact, strength normal in all extremities, sensation intact to light touch, gait normal, and DTRs symmetrical and normal.   Skin:  Intact without suspicious lesions or rashes. Tanned Cervical Nodes:  No lymphadenopathy noted Axillary Nodes:  No palpable lymphadenopathy Psych:  memory intact for recent and remote, normally interactive, good eye contact, not anxious appearing, and not depressed appearing.     Impression & Recommendations:  Problem # 1:  FATIGUE (ICD-780.79)  Orders: Venipuncture ([address]) TLB-BMP (Basic Metabolic Panel-BMET)  (80048-METABOL) TLB-CBC Platelet - w/Differential (85025-CBCD) TLB-Hepatic/Liver Function Pnl (80076-HEPATIC) TLB-TSH (Thyroid Stimulating Hormone) (84443-TSH)  Problem # 2:  BIPOLAR DISORDER UNSPECIFIED (ICD-296.80) as per Dr Tomasa Rand Orders: Venipuncture (60454)  Problem # 3:  FASTING HYPERGLYCEMIA (ICD-790.6)  Orders: Venipuncture (09811) TLB-A1C / Hgb A1C (Glycohemoglobin) (83036-A1C)  Problem # 4:  HYPERLIPIDEMIA NEC/NOS (ICD-272.4)  Orders: Venipuncture (91478) TLB-Lipid Panel (80061-LIPID)  Complete Medication List: 1)  Klor-con M20 20 Meq Tbcr (Potassium chloride crys cr) .... 2 tabs once daily except 3 pills t & sat 2)  Lamictal 150 Mg Tabs (Lamotrigine) .... 2 tabs qhs 3)  Wellbutrin Xl 150 Mg Xr24h-tab (Bupropion hcl) .... 3 by mouth once daily 4)  Vistaril 50 Mg Caps (Hydroxyzine pamoate) .Marland Kitchen.. 1-2 tabs tid 5)  Ibuprofen 800 Mg Tabs (Ibuprofen) .Marland Kitchen.. 1 tab by mouth q8 hours 6)  Hyoscyamine Sulfate 0.125 Mg Subl (Hyoscyamine sulfate) .... Dissolve 1-2 every 4 hours as needed 7)  Ranitidine Hcl 150 Mg Tabs (Ranitidine hcl) .Marland Kitchen.. 1 two times a day 8)  Miralax Powd (Polyethylene glycol 3350) .... Take 1 dose by mouth two times a day 9)  Cvs Stool Softener 100 Mg Caps (Docusate sodium) .Marland Kitchen.. 1 qd 10)  Dulcolax 5 Mg Tbec (Bisacodyl) .... Take 1-2 tablets by mouth as needed for constipation 11)  Xanax (? Dose)  .... As needed  Patient Instructions: 1)  Take record to Dr Tomasa Rand & when traveling.

## 2010-06-04 NOTE — Assessment & Plan Note (Signed)
Summary: WAS TOLD SHE HAD H1N1//VGJ   Vital Signs:  Patient Profile:   55 Years Old Female Weight:      172 pounds Temp:     98.2 degrees F oral Pulse rate:   68 / minute Resp:     16 per minute BP sitting:   110 / 68  (left arm)  Pt. in pain?   no  Vitals Entered By: [name] (March 05, 2008 11:20 AM)                  PCP:  Laury Axon  Chief Complaint:  Patient stated:"Dr. Laury Axon told me Saturday that she might have H1N1" and URI symptoms.  History of Present Illness:  URI Symptoms      This is a 55 year old woman who presents with URI symptoms.  The patient denies nasal congestion, clear nasal discharge, purulent nasal discharge, sore throat, dry cough, productive cough, earache, and sick contacts.  Associated symptoms include fever of 100.5-103 degrees.  The patient also reports muscle aches.  The patient denies itchy watery eyes, itchy throat, sneezing, seasonal symptoms, response to antihistamine, headache, and severe fatigue.  The patient denies the following risk factors for Strep sinusitis: unilateral facial pain, unilateral nasal discharge, poor response to decongestant, double sickening, tooth pain, Strep exposure, tender adenopathy, and absence of cough.  Pt called me on Saturday with flu like symptoms and tamiflu was started Saturday night.      Current Allergies: ! AMOXICILLIN ! * FENTANYL ! NUBAIN ! EFFEXOR ! PERCODAN ! PERCOCET ! HYDROCODONE ! SULFA ! VERSED ! * TESSALON PERLS   Family History:    Reviewed history from 06/30/2006 and no changes required:       breast ca, tia--mom, cad, lung ca--dad  Social History:    Reviewed history from 06/30/2006 and no changes required:       Lives alone, divorced, daughter at [employer] No smoking or alcohol; Not currently employed   Risk Factors: Tobacco use:  never Alcohol use:  yes    Type:  rare Exercise:  yes    Times per week:  5    Type:  WALKING   PAP Smear History:    Date of Last PAP  Smear:  05/03/2002   Review of Systems      See HPI   Physical Exam  General:     Well-developed,well-nourished,in no acute distress; alert,appropriate and cooperative throughout examination Ears:     External ear exam shows no significant lesions or deformities.  Otoscopic examination reveals clear canals, tympanic membranes are intact bilaterally without bulging, retraction, inflammation or discharge. Hearing is grossly normal bilaterally. Nose:     External nasal examination shows no deformity or inflammation. Nasal mucosa are pink and moist without lesions or exudates. Mouth:     Oral mucosa and oropharynx without lesions or exudates.  Teeth in good repair. Neck:     No deformities, masses, or tenderness noted. Lungs:     Normal respiratory effort, chest expands symmetrically. Lungs are clear to auscultation, no crackles or wheezes. Heart:     Normal rate and regular rhythm. S1 and S2 normal without gallop, murmur, click, rub or other extra sounds.    Impression & Recommendations:  Problem # 1:  INFLUENZA WITH OTHER RESPIRATORY MANIFESTATIONS (ICD-487.1)  Her updated medication list for this problem includes:    Tamiflu 75 Mg Caps (Oseltamivir phosphate) .Marland Kitchen... 1 by mouth two times a day Rest, increase fluids, use Tylenol  [phone] mg every 4-6 hours, and avoid contact with others. call office if no improvement in 5-7 days or if symptoms worsen.   Complete Medication List: 1)  Klor-con M20 20 Meq Tbcr (Potassium chloride crys cr) .... 2 tabs m,w,f,sun and 3 tabs t,th,sat 2)  Lamictal 150 Mg Tabs (Lamotrigine) .... 2 tabs qhs 3)  Wellbutrin Xl 300 Mg Tb24 (Bupropion hcl) .Marland Kitchen.. 1 by mouth qam 4)  Vistaril 50 Mg Caps (Hydroxyzine pamoate) .Marland Kitchen.. 1-2 tabs tid 5)  Spironolactone 25 Mg Tabs (Spironolactone) .Marland Kitchen.. 1 once daily as needed edema 6)  Tamiflu 75 Mg Caps (Oseltamivir phosphate) .Marland Kitchen.. 1 by mouth two times a day    ]

## 2010-06-17 ENCOUNTER — Telehealth: Payer: Self-pay | Admitting: Internal Medicine

## 2010-06-24 NOTE — Progress Notes (Signed)
Summary: Disability  Phone Note Other Incoming   Caller: [facility] [employer] [phone]  xt [phone] Summary of Call: I received voicemail this AM from [facility] stating that they needed additional info for the patient disability. I returned the call and left message that we did not take her out of work for an extended period of time (not short or long term disabilty). I did note that patient was to request this from Dr. Mliss Fritz and please give me a call back to discuss. Lucious Groves CMA  June 17, 2010 11:43 AM   Follow-up for Phone Call        I did not receive a call back, so I called again. Per Selena Batten the forms were completed by Dr. Tomasa Rand and no further action is needed from our office.  Follow-up by: Lucious Groves CMA,  June 18, 2010 9:55 AM

## 2010-07-17 LAB — BASIC METABOLIC PANEL
BUN: 8 mg/dL (ref 6–23)
CO2: 29 mEq/L (ref 19–32)
Calcium: 8.9 mg/dL (ref 8.4–10.5)
Chloride: 105 mEq/L (ref 96–112)
Creatinine, Ser: 1 mg/dL (ref 0.4–1.2)
GFR calc Af Amer: >60 mL/min (ref >60)
GFR calc non Af Amer: 58 mL/min — ABNORMAL LOW (ref >60)
Glucose, Bld: 93 mg/dL (ref 70–99)
Potassium: 3.4 mEq/L — ABNORMAL LOW (ref 3.5–5.1)
Sodium: 143 mEq/L (ref 135–145)

## 2010-07-17 LAB — CBC
HCT: 38.7 % (ref 36.0–46.0)
Hemoglobin: 13.5 g/dL (ref 12.0–15.0)
MCH: 32.7 pg (ref 26.0–34.0)
MCHC: 34.8 g/dL (ref 30.0–36.0)
MCV: 94.1 fL (ref 78.0–100.0)
Platelets: ADEQUATE 10*3/uL (ref 150–400)
RBC: 4.11 MIL/uL (ref 3.87–5.11)
RDW: 12.9 % (ref 11.5–15.5)
WBC: 5.6 10*3/uL (ref 4.0–10.5)

## 2010-07-17 LAB — DIFFERENTIAL
Basophils Absolute: 0.1 10*3/uL (ref 0.0–0.1)
Basophils Relative: 1 % (ref 0–1)
Eosinophils Absolute: 0.1 10*3/uL (ref 0.0–0.7)
Eosinophils Relative: 2 % (ref 0–5)
Lymphocytes Relative: 26 % (ref 12–46)
Lymphs Abs: 1.5 10*3/uL (ref 0.7–4.0)
Monocytes Absolute: 0.4 10*3/uL (ref 0.1–1.0)
Monocytes Relative: 8 % (ref 3–12)
Neutro Abs: 3.5 10*3/uL (ref 1.7–7.7)
Neutrophils Relative %: 63 % (ref 43–77)
Smear Review: ADEQUATE

## 2010-07-17 LAB — URINALYSIS, ROUTINE W REFLEX MICROSCOPIC
Bilirubin Urine: NEGATIVE
Glucose, UA: NEGATIVE mg/dL
Hgb urine dipstick: NEGATIVE
Ketones, ur: NEGATIVE mg/dL
Nitrite: NEGATIVE
Protein, ur: NEGATIVE mg/dL
Specific Gravity, Urine: 1.005 (ref 1.005–1.030)
Urobilinogen, UA: 0.2 mg/dL (ref 0.0–1.0)
pH: 6.5 (ref 5.0–8.0)

## 2010-07-17 LAB — PREGNANCY, URINE: Preg Test, Ur: NEGATIVE

## 2010-07-17 LAB — URINE MICROSCOPIC-ADD ON

## 2010-08-06 LAB — DIFFERENTIAL
Basophils Absolute: 0 10*3/uL (ref 0.0–0.1)
Basophils Relative: 0 % (ref 0–1)
Eosinophils Absolute: 0.1 10*3/uL (ref 0.0–0.7)
Eosinophils Relative: 3 % (ref 0–5)
Lymphocytes Relative: 34 % (ref 12–46)
Lymphs Abs: 1.7 10*3/uL (ref 0.7–4.0)
Monocytes Absolute: 0.4 10*3/uL (ref 0.1–1.0)
Monocytes Relative: 8 % (ref 3–12)
Neutro Abs: 2.6 10*3/uL (ref 1.7–7.7)
Neutrophils Relative %: 55 % (ref 43–77)

## 2010-08-06 LAB — CBC
HCT: 36 % (ref 36.0–46.0)
Hemoglobin: 12.2 g/dL (ref 12.0–15.0)
MCHC: 34 g/dL (ref 30.0–36.0)
MCV: 95.2 fL (ref 78.0–100.0)
Platelets: 147 10*3/uL — ABNORMAL LOW (ref 150–400)
RBC: 3.78 MIL/uL — ABNORMAL LOW (ref 3.87–5.11)
RDW: 13.2 % (ref 11.5–15.5)
WBC: 4.8 10*3/uL (ref 4.0–10.5)

## 2010-08-06 LAB — BASIC METABOLIC PANEL
BUN: 7 mg/dL (ref 6–23)
CO2: 28 mEq/L (ref 19–32)
Calcium: 9.1 mg/dL (ref 8.4–10.5)
Chloride: 109 mEq/L (ref 96–112)
Creatinine, Ser: 1.05 mg/dL (ref 0.4–1.2)
GFR calc Af Amer: >60 mL/min (ref >60)
GFR calc non Af Amer: 55 mL/min — ABNORMAL LOW (ref >60)
Glucose, Bld: 102 mg/dL — ABNORMAL HIGH (ref 70–99)
Potassium: 4.1 mEq/L (ref 3.5–5.1)
Sodium: 143 mEq/L (ref 135–145)

## 2010-08-07 LAB — POCT I-STAT 4, (NA,K, GLUC, HGB,HCT)
Glucose, Bld: 84 mg/dL (ref 70–99)
HCT: 42 % (ref 36.0–46.0)
Hemoglobin: 14.3 g/dL (ref 12.0–15.0)
Potassium: 3.4 mEq/L — ABNORMAL LOW (ref 3.5–5.1)
Sodium: 143 mEq/L (ref 135–145)

## 2010-08-10 LAB — DIFFERENTIAL
Basophils Absolute: 0 10*3/uL (ref 0.0–0.1)
Basophils Relative: 1 % (ref 0–1)
Eosinophils Absolute: 0.3 10*3/uL (ref 0.0–0.7)
Eosinophils Relative: 4 % (ref 0–5)
Lymphocytes Relative: 27 % (ref 12–46)
Lymphs Abs: 1.8 10*3/uL (ref 0.7–4.0)
Monocytes Absolute: 0.4 10*3/uL (ref 0.1–1.0)
Monocytes Relative: 5 % (ref 3–12)
Neutro Abs: 4.3 10*3/uL (ref 1.7–7.7)
Neutrophils Relative %: 64 % (ref 43–77)

## 2010-08-10 LAB — URINALYSIS, ROUTINE W REFLEX MICROSCOPIC
Bilirubin Urine: NEGATIVE
Glucose, UA: NEGATIVE mg/dL
Hgb urine dipstick: NEGATIVE
Ketones, ur: NEGATIVE mg/dL
Nitrite: NEGATIVE
Protein, ur: NEGATIVE mg/dL
Specific Gravity, Urine: 1.01 (ref 1.005–1.030)
Urobilinogen, UA: 0.2 mg/dL (ref 0.0–1.0)
pH: 6 (ref 5.0–8.0)

## 2010-08-10 LAB — CBC
HCT: 39.9 % (ref 36.0–46.0)
Hemoglobin: 13.7 g/dL (ref 12.0–15.0)
MCHC: 34.4 g/dL (ref 30.0–36.0)
MCV: 94.7 fL (ref 78.0–100.0)
Platelets: 187 10*3/uL (ref 150–400)
RBC: 4.21 MIL/uL (ref 3.87–5.11)
RDW: 12.8 % (ref 11.5–15.5)
WBC: 6.8 10*3/uL (ref 4.0–10.5)

## 2010-08-10 LAB — COMPREHENSIVE METABOLIC PANEL
ALT: 29 U/L (ref 0–35)
AST: 32 U/L (ref 0–37)
Albumin: 4 g/dL (ref 3.5–5.2)
Alkaline Phosphatase: 110 U/L (ref 39–117)
BUN: 15 mg/dL (ref 6–23)
CO2: 31 mEq/L (ref 19–32)
Calcium: 8.9 mg/dL (ref 8.4–10.5)
Chloride: 102 mEq/L (ref 96–112)
Creatinine, Ser: 1.2 mg/dL (ref 0.4–1.2)
GFR calc Af Amer: 58 mL/min — ABNORMAL LOW (ref >60)
GFR calc non Af Amer: 48 mL/min — ABNORMAL LOW (ref >60)
Glucose, Bld: 89 mg/dL (ref 70–99)
Potassium: 4 mEq/L (ref 3.5–5.1)
Sodium: 142 mEq/L (ref 135–145)
Total Bilirubin: 0.2 mg/dL — ABNORMAL LOW (ref 0.3–1.2)
Total Protein: 7.2 g/dL (ref 6.0–8.3)

## 2010-08-10 LAB — LIPASE, BLOOD: Lipase: 72 U/L (ref 23–300)

## 2010-08-18 ENCOUNTER — Encounter: Payer: Self-pay | Admitting: Internal Medicine

## 2010-08-18 ENCOUNTER — Ambulatory Visit (INDEPENDENT_AMBULATORY_CARE_PROVIDER_SITE_OTHER): Payer: BC Managed Care – PPO | Admitting: Internal Medicine

## 2010-08-18 VITALS — BP 122/86 | HR 64 | Temp 98.9°F | Wt 177.0 lb

## 2010-08-18 DIAGNOSIS — J029 Acute pharyngitis, unspecified: Secondary | ICD-10-CM

## 2010-08-18 DIAGNOSIS — R509 Fever, unspecified: Secondary | ICD-10-CM

## 2010-08-18 NOTE — Progress Notes (Signed)
  Subjective:    Patient ID: Sarah Salazar, female    DOB: [birth year], 55 y.o.   MRN: [id number]  HPI UPPER RESPIRATORY INFECTION  Onset: 04/16 as ST & fever upon awakening  Course: ST slightly  better Better with: Tylenol Sinus & Vistaril with some benefit  Sick contacts:weekend exposure to  wedding guests; niece had Strep  Nasal discharge (color,laterality): no  Fever: yes, to 101   Headache/face pain: yes, in temples, "constant & throbbing"  Double sickening: no  Tooth pain: no   Sneezing: no  Itchy scratchy throat: no  Seasonal sx: no   Muscle aches: no  Severe fatigue: no    Red Flags  Stiff neck: no  Dyspnea: no  Rash: no  Swallowing difficulty: yes, on liquids due to ST        Review of Systems     Objective:   Physical Exam  General appearance is one of good health and nourishment. Skull is normocephalic without lymphadenopathy about the head, neck, or axilla. See current vital signs Nose:  External nasal examination shows no deformity or inflammation. Nasal mucosa are pink and moist without lesions or exudates. Minor  septal dislocation .No obstruction to airflow. Ears:  External ear exam shows no significant lesions or deformities.  Otoscopic examination reveals clear canals, tympanic membranes are intact bilaterally without bulging, retraction, inflammation or discharge. Scarring of TMs .Heart:  Normal rate and regular rhythm. S1 and S2 normal without gallop, murmur, click, rub or other extra sounds.                                                                                                      Lungs:Chest clear to auscultation; no wheezes, rhonchi,rales ,or rubs present.No increased work of breathing.   Assessment & Plan:  #1 pharyngitis with low-grade fever & negative Beta Strep.The CENTOR criteria for pharyngitis which include fever, pharyngeal exudate, tender cervical lymphadenopathy, and absence of cough were assessed.

## 2010-08-18 NOTE — Patient Instructions (Signed)
Please use Zicam Melts  as needed for the sore throat. Over the next 4-5 days, please take vitamin C 2000 mg daily and  Echinacea. Call if you have cervical lymph nodes which are enlarged and tender and or exudate (pus) in the back of throat.

## 2010-08-20 ENCOUNTER — Telehealth: Payer: Self-pay | Admitting: Internal Medicine

## 2010-08-20 MED ORDER — DOXYCYCLINE HYCLATE 100 MG PO CAPS: 100.0000 mg | ORAL_CAPSULE | Freq: Two times a day (BID) | ORAL | Status: AC

## 2010-08-20 NOTE — Telephone Encounter (Signed)
Patient was seen on Tuesday---she called to report that she still has a fever (taking Advil and Tylanol Cold and Cough alternating)----fever goes down to 100 but goes up to 101.4---she is also taking Robitussin DM cough medicine for a dry cough----was told that she may need to get tested for strep if her lymph nodes become swollen, but they are not swollen  Uses [facility] corner of [address], [employer]

## 2010-08-20 NOTE — Telephone Encounter (Signed)
Pt aware rx called to pharmacy

## 2010-08-20 NOTE — Telephone Encounter (Signed)
An atypical bacterial infection may be present; she is allergic to amoxicillin and sulfa. Doxycycline 100 mg twice a day dispense 14. She should avoid direct sun while on this medicine.

## 2010-08-21 ENCOUNTER — Telehealth: Payer: Self-pay | Admitting: Internal Medicine

## 2010-08-21 NOTE — Telephone Encounter (Signed)
Patient needs work excuse from first day Tuesday 4/23 thru and including Sunday 4/22---she wants to go back to work on Monday 4/23 ---therefore she needs to pick up work excuse today---please call her at [phone]

## 2010-08-24 NOTE — Telephone Encounter (Signed)
Spoke w/ pt informed that per Hop will only do note from 4/17 date she was seen and the next day. Information in phone note taken incorrectly.

## 2010-09-15 NOTE — Procedures (Signed)
NAMEKINLEY, DOZIER NO.:  [id number]   MEDICAL RECORD NO.:  [id number]          PATIENT TYPE:  OUT   LOCATION:  SLEEP CENTER                 FACILITY:  [facility]   PHYSICIAN:  Barbaraann Share, MD,FCCPDATE OF BIRTH:  October 24, 1955   DATE OF STUDY:                            NOCTURNAL POLYSOMNOGRAM   REFERRING PHYSICIAN:  Titus Dubin. Hopper, MD,FACP,FCCP   INDICATION FOR STUDY:  Hypersomnia with sleep apnea.   EPWORTH SLEEPINESS SCORE:  13   SLEEP ARCHITECTURE:  The patient had total sleep time of 355 minutes  with decreased slow wave sleep as well as REM. Sleep onset latency was  prolonged at 60 minutes and REM onset was prolonged at 233 minutes.  Sleep efficiency was mildly decreased at 88%.   RESPIRATORY DATA:  The patient was found to have 8 hypopneas and 4  apneas for an apnea hypopnea index of only 2 events per hour. The events  occurred most commonly in the supine position. There was mild snoring  noted.   OXYGEN DATA:  There was transient O2 desaturation as low as 91% with a  few of the patient's obstructive events.   CARDIAC DATA:  No clinically significant cardiac arrhythmias were noted.   MOVEMENT-PARASOMNIA:  Small numbers of leg jerks without significant  sleep disruption.   IMPRESSIONS-RECOMMENDATIONS:  Small numbers of obstructive events which  do not meet that apnea hypopnea index criteria for the obstructive sleep  apnea syndrome. If the patient has significant daytime sleepiness that  remains unexplained, consideration should be given to other sleep  disorders which can result in this symptom. Clinical correlation  suggested.      Barbaraann Share, MD,FCCP  Diplomate, American Board of Sleep  Medicine  Electronically Signed     [facility]/MEDQ  D:  10/26/2006 17:21:11  T:  10/27/2006 08:03:51  Job:  [id number]

## 2010-09-18 NOTE — Op Note (Signed)
NAMEJANNELL, Sarah Salazar                          ACCOUNT NO.:  [id number]   MEDICAL RECORD NO.:  [id number]                   PATIENT TYPE:  EMS   LOCATION:  ED                                   FACILITY:  [facility]   PHYSICIAN:  Abigail Miyamoto, M.D.              DATE OF BIRTH:  01-01-1956   DATE OF PROCEDURE:  05/21/2002  DATE OF DISCHARGE:  05/06/2002                                 OPERATIVE REPORT   PREOPERATIVE DIAGNOSIS:  Acute cholecystitis.   POSTOPERATIVE DIAGNOSIS:  Acute cholecystitis.   PROCEDURE:  Laparoscopic cholecystectomy.   SURGEON:  Abigail Miyamoto, M.D.   ANESTHESIA:  General endotracheal anesthesia.   ESTIMATED BLOOD LOSS:  Minimal.   FINDINGS:  The patient was found to have a mildly acutely inflamed  gallbladder containing gallstones.   DESCRIPTION OF PROCEDURE:  The patient was brought to the operating room and  identified positively.  She was placed supine upon  the operating table and  general anesthesia was induced.  Her abdomen was then prepped and draped in  the usual sterile fashion.  Using a #15 blade, a small transverse incision  was made below the umbilicus through a previous scar.  The incision was  carried down through the fascia, which was then opened with a scalpel.  A  hemostat was then used to pass through the peritoneal cavity.  Next a 0  Vicryl pursestring suture was placed through the fascial opening.  The  Hasson port was placed through the opening and insufflation of the abdomen  was begun.  Next an 11 mm port was placed in the patient's epigastrium and  two 5 mm ports were placed in the patient's right flank under direct vision.  The gallbladder was then grasped and retracted above the liver bed.  Dissection was then carried out in the base of the gallbladder.  Several  adhesions were bluntly dissected free.  The cystic artery and cystic duct  were then easily dissected out.  The artery was first clipped proximally and  dissected and  then the duct was clipped three times proximally, once  distally, and transected.  The gallbladder was then slowly dissected free  from the liver bed with the electrocautery.  Hemostasis appeared to be  achieved in the liver bed with the cautery.  Once the gallbladder was free  from the liver bed, it was removed through the incision at the umbilicus.  The 0 Vicryl at the umbilicus was tied in place, closing the fascial defect.  The liver bed was again examined and hemostasis was achieved.  The abdomen  was then copiously irrigated with normal saline.  All ports were then  removed under direct vision and the abdomen was deflated.  All incisions  were then anesthetized with 0.25% Marcaine and then closed with 4-0 Monocryl  subcuticular sutures.  Steri-Strips, gauze, and tape were then applied.  The  patient tolerated the procedure  well.  All sponge, ,needle, a nondistended  instrument counts were correct at the end of the procedure.  The patient was  then extubated in the operating room and taken in stable condition to the  recovery room.                                               Abigail Miyamoto, M.D.    DB/MEDQ  D:  05/21/2002  T:  05/21/2002  Job:  [id number]

## 2010-09-18 NOTE — Assessment & Plan Note (Signed)
[facility]                                 ON-CALL NOTE   NAME:Sarah Salazar, Sarah Salazar                       MRN:          [id number]  DATE:04/08/2006                            DOB:          01-10-1956    ON CALL, April 08, 2006.   This patient calls frequently on nights and weekends.  Referred this to  Dr. Alwyn Ren and nurse Olegario Messier.  It is 5:15.  They will call her and find  out what the issue is.     Jeffrey A. Tawanna Cooler, MD  Electronically Signed    JAT/MedQ  DD: 04/08/2006  DT: 04/09/2006  Job #: [id number]

## 2010-09-18 NOTE — Letter (Signed)
August 30, 2006    Tiajuana Amass, MD  [address]  [place], [place]  [address]   RE:  Sarah Salazar, Sarah Salazar  MRN:  [id number]  /  DOB:  [birth year]   Dear Dr. Tomasa Rand,   I saw [facility] August 30, 2006.  She had stopped all the psychotropic drugs  you had prescribed because of excess somnolence. She has had similar  symptoms in the past with Seroquel. She also had sleep disturbance at  the time of her daughter's cardiac surgery. She has been told she  snores; there is no past history of apnea.   I prescribed Rozerem to be taking nightly until she is reevaluated by  you.   She describes profound fatigue which is most likely related to the sleep  disfunction. She has had hypokalemia in the past but her potassium and  full thyroid function tests were normal.   Based on your reassessment a sleep apnea study can be performed. This  does appear to be a problem related to medications and certain  situations rather than underlying  sleep dysfunction.    Sincerely,      Titus Dubin. Alwyn Ren, MD,FACP,FCCP  Electronically Signed    [facility]/MedQ  DD: 08/31/2006  DT: 08/31/2006  Job #: [id number]

## 2010-09-18 NOTE — Op Note (Signed)
NAMENORISSA, BARTEE                ACCOUNT NO.:  [id number]   MEDICAL RECORD NO.:  [id number]          PATIENT TYPE:  AMB   LOCATION:  DSC                          FACILITY:  [facility]   PHYSICIAN:  Cindee Salt, M.D.       DATE OF BIRTH:  [birth year]   DATE OF PROCEDURE:  02/26/2004  DATE OF DISCHARGE:                                 OPERATIVE REPORT   PREOPERATIVE DIAGNOSIS:  Lateral epicondylitis, left elbow.   POSTOPERATIVE DIAGNOSIS:  Lateral epicondylitis, left elbow.   OPERATION:  Reconstruction, lateral epicondylitis, left elbow, with  debridement of synovial frond, reattachment of extensor origin, left elbow.   SURGEON:  Cindee Salt, M.D.   ASSISTANTCarolyne Fiscal.   ANESTHESIA:  General.   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   PROCEDURE:  The patient was brought to the operating room, where a general  anesthetic was carried out without difficulty.  She was prepped using  Duraprep in supine position, left arm free.  A longitudinal incision was  made over the lateral epicondyle, carried down through subcutaneous tissue,  bleeders were electrocauterized, dissection carried down to the anconeus-  extensor interval.  This was spread, opened down to the joint.  A  significant amount of fluid was present within the joint.  The area had been  infiltrated, however, with 0.25% Marcaine without epinephrine.  The area was  debrided.  A rongeur was then introduced into the joint and the synovial  frond between the radial head and capitellum was removed.  The radial head  showed minimal chondromalacia, otherwise was intact.  Both pronation and  supination were present.  A separate incision was then made over the lateral  epicondyle, carried down through the tissue.  This was then spread with the  incision being made longitudinally.  The area of the extensor brevis was  then identified.   This was found to have degeneration and a myxoid change to  it.  This area was debrided with a rongeur.  The epicondyle was then  roughened.  Drill holes were placed for placement of a suture to  reapproximate this to the lateral epicondyle.  This was performed with a 3-0  Monocryl suture with the wrist fully extended and elbow flexed.  This was  done after irrigation of the area.  This was then sutured.  The interval in  the extensor origin was then closed with figure-of-eight 4-0 Monocryl  sutures.  The joint was closed with figure-of-eight 4-0 Vicryl sutures, the  subcutaneous tissue closed with interrupted 4-0 Vicryl and the skin with a 4-  0 Monocryl  suture.  Steri-Strips were applied, a sterile compressive dressing and long-  arm splint applied.  The patient tolerated the procedure well and was taken  to the recovery room for observation in satisfactory condition.  She is  discharged home to return to the [facility] [place] in one week on  [place] and __________.       GK/MEDQ  D:  02/26/2004  T:  02/26/2004  Job:  [id number]

## 2010-09-18 NOTE — Procedures (Signed)
EEG NUMBER:  [id number]   CLINICAL HISTORY:  This is a routine EEG done with photic stimulation and  hyperventilation in the outpatient setting.  The patient is described as  awake and drowsy.  This is a 55 year old woman with a syncopal event.  EEG  is performed for evaluation of possible seizure.   DESCRIPTION:  The dominant rhythm's tracing is a moderate amplitude alpha-  rhythm of 10 Hertz which predominates posteriorly, appears without abnormal  asymmetry and attenuates with eye opening and closing.  Low amplitude fast  activity is seen frontally and centrally and appears without abnormal  asymmetry.  The record is made in the awake and drowsy states.  Drowsiness  is identified by fragmentation of the background rhythm and generalized  slowing of attenuation of rhythms, intermittently and particularly in the  drowsy state.  Some brief episodes of left temporal slowing into the 6-7  Hertz range are seen.  No epileptiform discharges are seen.  Stage 2 sleep  is not recorded.  Photic stimulation produced symmetric driving responses.  Hyperventilation produced mild flowing of the background rhythm with a bit  more prominence of the rare left temporal slowing.  Single channel devoted  to EKG revealed sinus rhythm throughout with a rate of approximately 60  beats per minutes.   CONCLUSION:  Abnormal study due to the presence of rare intermittent focal  slowing over the left temporal area with findings suggestive of possible  underlying focal pathology.  No epileptiform discharges are seen.  Correlation with exam and/or neuroimaging is recommended.      Michael L. Thad Ranger, M.D.  Electronically Signed     ZOX:WRUE  D:  07/29/2005 17:27:15  T:  07/30/2005 14:56:36  Job #:  [id number]

## 2010-11-19 ENCOUNTER — Other Ambulatory Visit: Payer: Self-pay | Admitting: Internal Medicine

## 2010-11-19 NOTE — Telephone Encounter (Signed)
Left message on voicemail for patient to return call to discuss refill request for potassium  ? 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 (Patient seen since Epic and Potassium not on med list)

## 2010-11-20 NOTE — Telephone Encounter (Signed)
Left message on voicemail for patient to return call when available   

## 2010-11-20 NOTE — Telephone Encounter (Signed)
Spoke w/ pt says she hasn't been able to afford medication since she hasn't had any insurance and says that she has been taking medication 2 times daily. Ok to refill?

## 2010-11-23 NOTE — Telephone Encounter (Signed)
Left msg for pt to return call.

## 2010-11-23 NOTE — Telephone Encounter (Signed)
This can be refilled; potassium should be rechecked with  BUN and creatinine. Last potassium was 3.6 in January of this year.

## 2010-11-24 ENCOUNTER — Encounter: Payer: Self-pay | Admitting: Internal Medicine

## 2010-11-30 MED ORDER — POTASSIUM CHLORIDE CRYS ER 20 MEQ PO TBCR: 20.0000 meq | EXTENDED_RELEASE_TABLET | Freq: Every day | ORAL | Status: DC

## 2010-12-02 ENCOUNTER — Other Ambulatory Visit: Payer: Self-pay | Admitting: Internal Medicine

## 2010-12-02 MED ORDER — POTASSIUM CHLORIDE CRYS ER 20 MEQ PO TBCR: EXTENDED_RELEASE_TABLET | ORAL | Status: DC

## 2011-01-05 ENCOUNTER — Emergency Department (HOSPITAL_COMMUNITY)
Admission: EM | Admit: 2011-01-05 | Discharge: 2011-01-05 | Disposition: A | Discharge status: Home / Self Care | Payer: 59 | Attending: Emergency Medicine | Admitting: Emergency Medicine

## 2011-01-05 ENCOUNTER — Emergency Department (HOSPITAL_COMMUNITY): Payer: 59

## 2011-01-05 DIAGNOSIS — R609 Edema, unspecified: Secondary | ICD-10-CM | POA: Insufficient documentation

## 2011-01-05 DIAGNOSIS — M940 Chondrocostal junction syndrome [Tietze]: Secondary | ICD-10-CM | POA: Insufficient documentation

## 2011-01-05 DIAGNOSIS — R0609 Other forms of dyspnea: Secondary | ICD-10-CM | POA: Insufficient documentation

## 2011-01-05 DIAGNOSIS — R0989 Other specified symptoms and signs involving the circulatory and respiratory systems: Secondary | ICD-10-CM | POA: Insufficient documentation

## 2011-01-05 DIAGNOSIS — E039 Hypothyroidism, unspecified: Secondary | ICD-10-CM | POA: Insufficient documentation

## 2011-01-05 DIAGNOSIS — Z9089 Acquired absence of other organs: Secondary | ICD-10-CM | POA: Insufficient documentation

## 2011-01-05 DIAGNOSIS — R079 Chest pain, unspecified: Secondary | ICD-10-CM | POA: Insufficient documentation

## 2011-01-05 LAB — DIFFERENTIAL
Basophils Absolute: 0 10*3/uL (ref 0.0–0.1)
Basophils Relative: 1 % (ref 0–1)
Eosinophils Absolute: 0.2 10*3/uL (ref 0.0–0.7)
Eosinophils Relative: 2 % (ref 0–5)
Lymphocytes Relative: 31 % (ref 12–46)
Lymphs Abs: 2.1 10*3/uL (ref 0.7–4.0)
Monocytes Absolute: 0.6 10*3/uL (ref 0.1–1.0)
Monocytes Relative: 10 % (ref 3–12)
Neutro Abs: 3.7 10*3/uL (ref 1.7–7.7)
Neutrophils Relative %: 56 % (ref 43–77)

## 2011-01-05 LAB — BASIC METABOLIC PANEL
BUN: 12 mg/dL (ref 6–23)
CO2: 23 mEq/L (ref 19–32)
Calcium: 9.3 mg/dL (ref 8.4–10.5)
Chloride: 104 mEq/L (ref 96–112)
Creatinine, Ser: 1.11 mg/dL — ABNORMAL HIGH (ref 0.50–1.10)
GFR calc Af Amer: >60 mL/min (ref >60)
GFR calc non Af Amer: 52 mL/min — ABNORMAL LOW (ref >60)
Glucose, Bld: 94 mg/dL (ref 70–99)
Potassium: 3.4 mEq/L — ABNORMAL LOW (ref 3.5–5.1)
Sodium: 138 mEq/L (ref 135–145)

## 2011-01-05 LAB — CBC
HCT: 39.5 % (ref 36.0–46.0)
Hemoglobin: 13.5 g/dL (ref 12.0–15.0)
MCH: 31 pg (ref 26.0–34.0)
MCHC: 34.2 g/dL (ref 30.0–36.0)
MCV: 90.6 fL (ref 78.0–100.0)
Platelets: 199 10*3/uL (ref 150–400)
RBC: 4.36 MIL/uL (ref 3.87–5.11)
RDW: 13 % (ref 11.5–15.5)
WBC: 6.6 10*3/uL (ref 4.0–10.5)

## 2011-01-05 LAB — D-DIMER, QUANTITATIVE: D-Dimer, Quant: 0.3 ug/mL-FEU (ref 0.00–0.48)

## 2011-01-29 LAB — POCT I-STAT, CHEM 8
BUN: 5 — ABNORMAL LOW
Calcium, Ion: 1.19
Chloride: 102
Creatinine, Ser: 1.2
Glucose, Bld: 91
HCT: 43
Hemoglobin: 14.6
Potassium: 3.5
Sodium: 141
TCO2: 29

## 2011-01-29 LAB — URINALYSIS, ROUTINE W REFLEX MICROSCOPIC
Bilirubin Urine: NEGATIVE
Glucose, UA: NEGATIVE
Hgb urine dipstick: NEGATIVE
Ketones, ur: NEGATIVE
Nitrite: NEGATIVE
Protein, ur: NEGATIVE
Specific Gravity, Urine: 1.017
Urobilinogen, UA: 0.2
pH: 6

## 2011-04-05 ENCOUNTER — Telehealth: Payer: Self-pay | Admitting: Internal Medicine

## 2011-04-05 NOTE — Telephone Encounter (Signed)
Error

## 2011-04-07 ENCOUNTER — Ambulatory Visit (INDEPENDENT_AMBULATORY_CARE_PROVIDER_SITE_OTHER): Payer: 59 | Admitting: Internal Medicine

## 2011-04-07 ENCOUNTER — Encounter: Payer: Self-pay | Admitting: Internal Medicine

## 2011-04-07 DIAGNOSIS — R1310 Dysphagia, unspecified: Secondary | ICD-10-CM

## 2011-04-07 DIAGNOSIS — E039 Hypothyroidism, unspecified: Secondary | ICD-10-CM

## 2011-04-07 DIAGNOSIS — K219 Gastro-esophageal reflux disease without esophagitis: Secondary | ICD-10-CM

## 2011-04-07 MED ORDER — RANITIDINE HCL 150 MG PO CAPS: 150.0000 mg | ORAL_CAPSULE | Freq: Two times a day (BID) | ORAL | Status: DC

## 2011-04-07 MED ORDER — LEVOTHYROXINE SODIUM 100 MCG PO TABS: 100.0000 ug | ORAL_TABLET | Freq: Every day | ORAL | Status: DC

## 2011-04-07 NOTE — Progress Notes (Signed)
Addended by: Legrand Como on: 04/07/2011 10:29 AM   Modules accepted: Orders

## 2011-04-07 NOTE — Patient Instructions (Signed)
Please complete stool cards The triggers for dyspepsia or "heart burn"  include stress; the "aspirin family" ; alcohol; peppermint; and caffeine (coffee, tea, cola, and chocolate). The aspirin family would include aspirin and the nonsteroidal agents such as ibuprofen &  Naproxen. Tylenol would not cause reflux. If having dyspepsia ; food & drink should be avoided for @ least 2 hours before going to bed.

## 2011-04-07 NOTE — Progress Notes (Signed)
  Subjective:    Patient ID: Sarah Salazar, female    DOB: May 28, 1955, 55 y.o.   MRN: [id number]  HPI Thyroid function monitor  Medications status(change in dose/brand/mode of administration):no Constitutional: Weight change: up 20#; Fatigue:only with long hours on job; Sleep pattern:good; Appetite:good  Visual change(blurred/diplopia/visual loss):no Hoarseness:no; Swallowing issues:intermittent food dysphagia , 1-2x / week despite Ranitidine OTC daily Cardiovascular: Palpitations:no; Racing:no; Irregularity:no GI: Constipation:no; Diarrhea:no Derm: Change in nails/hair/skin:no Neuro: Numbness/tingling:no; Tremor:no Psych: Anxiety& Depression:controlled with Xanax Rxed by by Dr Tiajuana Amass Endo: Temperature intolerance: Heat:no; Cold:yes      Review of Systems she denies abdominal pain, melena, rectal bleeding or other bowel changes. She had an endoscopy and colonoscopy approximately 10 years ago to evaluate unexplained weight loss.     Objective:   Physical Exam  Gen.:  well-nourished; in no acute distress Eyes: Extraocular motion intact; no lid lag or proptosis, no icterus  Mouth: Dental hygiene is good; there is no oral pharyngeal erythema or edema. Neck: full ROM ;thyroid normal Heart: Normal rhythm and rate without significant murmur, gallop, or extra heart sounds Lungs: Chest clear to auscultation without rales,rales, wheezes Neuro:Deep tendon reflexes are equal and within normal limits; no tremor  Skin: Warm and dry without significant lesions or rashes; no onycholysis Vasc: All pulses are intact without bruits Psych: Normally communicative and interactive; no abnormal mood or affect clinically.         Assessment & Plan:  #1 hypothyroidism; TSH assessment is necessary  #2 20 pound weight gain; this is most likely related to dietary changes related to  job requirements  #3 intermittent dysphasia worrisome for significant reflux. She's been off the Rx   ranitidine. If symptoms persist despite resumption of the ranitidine twice a day at full dose; GI referral is indicated. Stool cards will be collected along with a CBC and differential.

## 2011-05-05 ENCOUNTER — Encounter: Payer: 59 | Admitting: Internal Medicine

## 2011-05-27 ENCOUNTER — Encounter (HOSPITAL_BASED_OUTPATIENT_CLINIC_OR_DEPARTMENT_OTHER): Payer: Self-pay | Admitting: *Deleted

## 2011-05-27 ENCOUNTER — Emergency Department (HOSPITAL_BASED_OUTPATIENT_CLINIC_OR_DEPARTMENT_OTHER)
Admission: EM | Admit: 2011-05-27 | Discharge: 2011-05-27 | Disposition: A | Discharge status: Home / Self Care | Payer: 59 | Attending: Emergency Medicine | Admitting: Emergency Medicine

## 2011-05-27 DIAGNOSIS — E039 Hypothyroidism, unspecified: Secondary | ICD-10-CM | POA: Insufficient documentation

## 2011-05-27 DIAGNOSIS — Z79899 Other long term (current) drug therapy: Secondary | ICD-10-CM | POA: Insufficient documentation

## 2011-05-27 DIAGNOSIS — K589 Irritable bowel syndrome without diarrhea: Secondary | ICD-10-CM | POA: Insufficient documentation

## 2011-05-27 DIAGNOSIS — F341 Dysthymic disorder: Secondary | ICD-10-CM | POA: Insufficient documentation

## 2011-05-27 DIAGNOSIS — R21 Rash and other nonspecific skin eruption: Secondary | ICD-10-CM | POA: Insufficient documentation

## 2011-05-27 DIAGNOSIS — T7840XA Allergy, unspecified, initial encounter: Secondary | ICD-10-CM

## 2011-05-27 LAB — URINALYSIS, ROUTINE W REFLEX MICROSCOPIC
Bilirubin Urine: NEGATIVE
Glucose, UA: NEGATIVE mg/dL
Hgb urine dipstick: NEGATIVE
Ketones, ur: NEGATIVE mg/dL
Leukocytes, UA: NEGATIVE
Nitrite: NEGATIVE
Protein, ur: NEGATIVE mg/dL
Specific Gravity, Urine: 1.006 (ref 1.005–1.030)
Urobilinogen, UA: 0.2 mg/dL (ref 0.0–1.0)
pH: 6 (ref 5.0–8.0)

## 2011-05-27 NOTE — ED Notes (Signed)
Woke with rash yesterday. Dizzy and lightheaded. No appetite and nausea.

## 2011-05-27 NOTE — ED Provider Notes (Signed)
History     CSN: [id number]  Arrival date & time 05/27/11  2010   First MD Initiated Contact with Patient 05/27/11 2059      Chief Complaint  Patient presents with  . Rash    (Consider location/radiation/quality/duration/timing/severity/associated sxs/prior treatment) HPI Comments: Rash, only new exposures are lamictal prescription was mail-ordered.  No shortness of breath or chest pain.  Patient is a 56 y.o. female presenting with rash. The history is provided by the patient.  Rash  This is a new problem. The current episode started 12 to 24 hours ago. The problem has been gradually improving. The problem is associated with nothing. There has been no fever. Affected Location: chest.    Past Medical History  Diagnosis Date  . IBS (irritable bowel syndrome)   . Anxiety   . Depression   . Thyroid disease     hypothyroidism    Past Surgical History  Procedure Date  . Cholecystectomy   . Dilation and curettage of uterus     x3  . Knee surgery     x2  . Wrist surgery     x3  . Laparoscopy 06/03/1990    to evaluate dysfunctional menses & pelvic pain  . Breast lumpectomy     left  . Cystocele repair 01/13/09  . Upper gastrointestinal endoscopy 2002  . Colonoscopy 2002  . Mastectomy, partial     benign fibrocystic changes    Family History  Problem Relation Age of Onset  . Coronary artery disease Mother   . Stroke Mother     TIA  . Cancer Mother     BREAST  . Cancer Father     LUNG  . Heart disease Father   . Cancer Paternal Uncle     COLON; STOMACH  . COPD Father   . Thyroid disease Mother     goiter    History  Substance Use Topics  . Smoking status: Never Smoker   . Smokeless tobacco: Not on file  . Alcohol Use: No    OB History    Grav Para Term Preterm Abortions TAB SAB Ect Mult Living                  Review of Systems  Skin: Positive for rash.  All other systems reviewed and are negative.    Allergies  Amoxicillin; Fentanyl;  Midazolam; Oxycodone-aspirin; Hydrocodone; Nalbuphine; Oxycodone-acetaminophen; Venlafaxine; Sulfonamide derivatives; and Tessalon  Home Medications   Current Outpatient Rx  Name Route Sig Dispense Refill  . ALPRAZOLAM 0.5 MG PO TABS Oral Take 0.25-0.5 mg by mouth 2 (two) times daily as needed. For anxiety    . BECLOMETHASONE DIPROPIONATE 80 MCG/ACT IN AERS Inhalation Inhale 2 puffs into the lungs 2 (two) times daily as needed. For shortness of breath    . BUPROPION HCL 100 MG PO TABS Oral Take 100 mg by mouth 3 (three) times daily.      Marland Kitchen GABAPENTIN 100 MG PO CAPS Oral Take 100 mg by mouth 3 (three) times daily.      Marland Kitchen HYDROXYZINE PAMOATE 50 MG PO CAPS Oral Take 50 mg by mouth at bedtime as needed. For sleep    . HYOSCYAMINE SULFATE 0.125 MG PO TBDP Sublingual Place 0.125 mg under the tongue every 4 (four) hours as needed. For stomach spasms     . IBUPROFEN 800 MG PO TABS Oral Take 800 mg by mouth every 8 (eight) hours as needed. For pain     . LAMOTRIGINE  150 MG PO TABS Oral Take 300 mg by mouth at bedtime as needed. For bi polar disorder    . LEVOTHYROXINE SODIUM 100 MCG PO TABS Oral Take 1 tablet (100 mcg total) by mouth daily. 90 tablet 1  . POTASSIUM CHLORIDE CRYS ER 20 MEQ PO TBCR Oral Take 40-60 mEq by mouth daily. 2 tablets once daily EXCEPT 3 tablets on Tuesday and Saturday    . RANITIDINE HCL 150 MG PO CAPS Oral Take 1 capsule (150 mg total) by mouth 2 (two) times daily. 180 capsule 1    BP 159/91  Pulse 83  Temp(Src) 98.3 F (36.8 C) (Oral)  Resp 18  Wt 183 lb (83.008 kg)  SpO2 100%  Physical Exam  Nursing note and vitals reviewed. Constitutional: She is oriented to person, place, and time. She appears well-developed and well-nourished.  HENT:  Head: Normocephalic and atraumatic.  Neck: Normal range of motion. Neck supple.  Cardiovascular: Normal rate.   No murmur heard. Pulmonary/Chest: Effort normal and breath sounds normal. No respiratory distress. She has no  wheezes.  Abdominal: Soft. Bowel sounds are normal.  Musculoskeletal: Normal range of motion.  Neurological: She is alert and oriented to person, place, and time.  Skin: She is not diaphoretic.       There is a very subtle, urticarial rash to the chest.      ED Course  Procedures (including critical care time)   Labs Reviewed  URINALYSIS, ROUTINE W REFLEX MICROSCOPIC   No results found.   No diagnosis found.    MDM  Was sent here by pcp to rule out Stevens-Johnson.  It is not Stevens-Johnson.  Will recommend bendryl (at home as patient states she cannot drive with this medication).        Geoffery Lyons, MD 05/27/11 2118

## 2011-06-01 ENCOUNTER — Encounter: Payer: 59 | Admitting: Internal Medicine

## 2011-08-25 ENCOUNTER — Encounter: Payer: Self-pay | Admitting: Internal Medicine

## 2011-08-25 ENCOUNTER — Ambulatory Visit (INDEPENDENT_AMBULATORY_CARE_PROVIDER_SITE_OTHER): Payer: 59 | Admitting: Internal Medicine

## 2011-08-25 VITALS — BP 126/90 | HR 84 | Temp 98.5°F | Wt 181.6 lb

## 2011-08-25 DIAGNOSIS — J029 Acute pharyngitis, unspecified: Secondary | ICD-10-CM

## 2011-08-25 DIAGNOSIS — H6980 Other specified disorders of Eustachian tube, unspecified ear: Secondary | ICD-10-CM

## 2011-08-25 DIAGNOSIS — H6990 Unspecified Eustachian tube disorder, unspecified ear: Secondary | ICD-10-CM

## 2011-08-25 LAB — POCT RAPID STREP A (OFFICE): Rapid Strep A Screen: NEGATIVE

## 2011-08-25 NOTE — Progress Notes (Signed)
  Subjective:    Patient ID: Sarah Salazar, female    DOB: May 10, 1955, 56 y.o.   MRN: [id number]  HPI Her symptoms began 2 weeks ago as pressure in the right ear. This has persisted and progressed to some extent. She treated this with Dayquil over-the-counter productsWith some improvement. He's also had associated malaise during this period without fever, chills, or sweats. In the last 48 hours she's noted some rhinitis and sore throat.    Review of Systems She denies frontal headache, facial pain, nasal purulence, otic discharge, or dental pain.  She has had some coughing in the context of being around perfume worn by coworkers without associated itchy eyes and sneezing      Objective:   Physical Exam General appearance:good health ;well nourished; no acute distress or increased work of breathing is present.  No  lymphadenopathy about the head, neck, or axilla noted.   Eyes: No conjunctival inflammation or lid edema is present.   Ears:  External ear exam shows no significant lesions or deformities.  Otoscopic examination reveals clear canals. Tympanic membranes are intact bilaterally without bulging or erythema but with significant scarring bilaterally Nose:  External nasal examination shows no deformity or inflammation. Nasal mucosa are pink and moist without lesions or exudates. No septal dislocation or deviation.No obstruction to airflow.   Oral exam: Dental hygiene is good; lips and gums are healthy appearing.There is no oropharyngeal erythema or exudate noted.     Heart:  Normal rate and regular rhythm. S1 and S2 normal without gallop, murmur, click, rub or other extra sounds.   Lungs:Chest clear to auscultation; no wheezes, rhonchi,rales ,or rubs present.No increased work of breathing.    Extremities:  No cyanosis, edema, or clubbing  noted    Skin: Warm & dry            Assessment & Plan: #1 #1   #1 eustachian tube dysfunction with associated malaise, cough, and  throat symptoms. Clinically no evidence of rhinosinusitis. Beta strep is negative  Plan: See orders and recommendations

## 2011-08-25 NOTE — Patient Instructions (Signed)
Zicam Melts or Zinc lozenges ; vitamin C 2000 mg daily; & Echinacea for 4-7 days. Report fever, exudate("pus") or progressive pain.  Go to Web MD for eustachian tube dysfunction. Drink thin  fluids liberally through the day and chew sugarless gum . Do the Valsalva maneuver several times a day to "pop" ears open. Plain Mucinex for thick secretions ;force NON dairy fluids . Use a Neti pot daily as needed for sinus congestion; going from open side to congested side . Nasal cleansing in the shower as discussed. Make sure that all residual soap is removed to prevent irritation.  Nasonex sample 1 spray in each nostril twice a day as needed. Use the "crossover" technique as discussed. Plain Allegra 160 daily as needed for itchy eyes & sneezing.

## 2011-08-27 ENCOUNTER — Encounter: Payer: Self-pay | Admitting: *Deleted

## 2011-08-27 ENCOUNTER — Telehealth: Payer: Self-pay | Admitting: Internal Medicine

## 2011-08-27 MED ORDER — AZITHROMYCIN 250 MG PO TABS: ORAL_TABLET | ORAL | Status: AC

## 2011-08-27 MED ORDER — PREDNISONE 20 MG PO TABS: 10.0000 mg | ORAL_TABLET | Freq: Three times a day (TID) | ORAL | Status: DC

## 2011-08-27 NOTE — Telephone Encounter (Signed)
Allergic to penicillin and sulfa. Z-Pak dispense one. Prednisone 20 mg 1/2  Three  times a day with meals dispense 9. Note for work okay.

## 2011-08-27 NOTE — Telephone Encounter (Signed)
Discuss with patient  

## 2011-08-27 NOTE — Telephone Encounter (Signed)
Caller: Sarah Salazar/Patient; PCP: Sarah Salazar; CB#: [phone];  Call regarding; Watery eyes, sinus congestion, dry cough, eyes feel like they've been scooped out with a spoon x 2 d. Afebrile per pt. Allegra brings no relief. Sx have worsened since her OV on 08/25/11. She has stayed home from work the past 2 days. Pls call as she missed her call back earlier, no appts are available.

## 2011-08-27 NOTE — Telephone Encounter (Signed)
Patient states is now worse then when she was in on Wednesday & the allegra did not help at all. Please call on cell [phone] Also Patient has missed work Wednesday thru today can she get a note?

## 2011-08-27 NOTE — Telephone Encounter (Signed)
Tried to call Pt to get more detail on symptoms no answer.

## 2011-08-27 NOTE — Telephone Encounter (Signed)
Plain Mucinex for thick secretions ;force NON dairy fluids . Use a Neti pot daily as needed for sinus congestion; going from open side to congested side . Nasal cleansing in the shower as discussed. Make sure that all residual soap is removed to prevent irritation. Fluticasone 1 spray in each nostril twice a day as needed can be called in.   Plain Allegra 160 daily OR  Zyrtec 10 mg qhs OR Claritin 10 mg  as needed for itchy eyes & sneezingAvoid decongestants.

## 2011-08-31 ENCOUNTER — Telehealth: Payer: Self-pay | Admitting: Internal Medicine

## 2011-08-31 NOTE — Telephone Encounter (Signed)
ESP Triage

## 2011-08-31 NOTE — Telephone Encounter (Signed)
Left message to call office

## 2011-08-31 NOTE — Telephone Encounter (Addendum)
No I am unsure what this means, Spoke with CAN who states that wrong info was sent will send correct info now.

## 2011-08-31 NOTE — Telephone Encounter (Signed)
Do you understand what this means ? ESP ???

## 2011-08-31 NOTE — Telephone Encounter (Signed)
If symptoms persist, she would need reevaluation and possible imaging and CBC and differential.

## 2011-08-31 NOTE — Telephone Encounter (Signed)
Caller: Sarah Salazar/Patient; PCP: Marga Melnick; CB#: [phone];  Call regarding Cough/Congestion, postnasal drip; Onset 4/22. Reports some improvement since she was seen.  Dayquil Sinus is the only thing that seems to help with her congestion.  emergent sx ruled out.  Home care per URI protocol.  Parameters for callback given.   Caller agreed.

## 2011-09-01 ENCOUNTER — Encounter: Payer: Self-pay | Admitting: Internal Medicine

## 2011-09-01 ENCOUNTER — Ambulatory Visit (INDEPENDENT_AMBULATORY_CARE_PROVIDER_SITE_OTHER): Payer: 59 | Admitting: Internal Medicine

## 2011-09-01 ENCOUNTER — Ambulatory Visit (HOSPITAL_BASED_OUTPATIENT_CLINIC_OR_DEPARTMENT_OTHER)
Admission: RE | Admit: 2011-09-01 | Discharge: 2011-09-01 | Disposition: A | Discharge status: Home / Self Care | Payer: 59 | Source: Ambulatory Visit | Attending: Internal Medicine | Admitting: Internal Medicine

## 2011-09-01 VITALS — BP 130/82 | HR 90 | Temp 98.1°F | Wt 178.6 lb

## 2011-09-01 DIAGNOSIS — R05 Cough: Secondary | ICD-10-CM

## 2011-09-01 DIAGNOSIS — R509 Fever, unspecified: Secondary | ICD-10-CM | POA: Insufficient documentation

## 2011-09-01 DIAGNOSIS — J209 Acute bronchitis, unspecified: Secondary | ICD-10-CM

## 2011-09-01 DIAGNOSIS — R059 Cough, unspecified: Secondary | ICD-10-CM

## 2011-09-01 MED ORDER — FLUTICASONE-SALMETEROL 250-50 MCG/DOSE IN AEPB: 1.0000 | INHALATION_SPRAY | Freq: Two times a day (BID) | RESPIRATORY_TRACT | Status: DC

## 2011-09-01 MED ORDER — DOXYCYCLINE HYCLATE 100 MG PO TABS: 100.0000 mg | ORAL_TABLET | Freq: Two times a day (BID) | ORAL | Status: DC

## 2011-09-01 NOTE — Patient Instructions (Signed)
Order for x-rays entered into  the computer; these will be performed at [facility]. No appointment is necessary.  Use Robitussin-DM as needed for cough as per the package labeling Please try to go on My Chart within the next 24 hours to allow me to release the results directly to you.

## 2011-09-01 NOTE — Telephone Encounter (Signed)
Discuss with patient, would like to come in for OV.

## 2011-09-01 NOTE — Progress Notes (Signed)
  Subjective:    Patient ID: Sarah Salazar, female    DOB: December 07, 1955, 56 y.o.   MRN: [id number]  HPI Cough persists with essentially no sputum production. She describes a "rawness" in the upper chest area. She also has a sensation of postnasal drainage without definite sinus congestion. Robitussin has helped his cough some. She is unable to take Tessalon or codeine preparations. She developed a rash while taking Tessalon Perls and amoxicillin simultaneously.  She has completed the Zithromax and has a single prednisone pill left. She was not able to take it at night as it caused insomnia.  She has had a loss of appetite present illness  Past medical history of asthmatic bronchitis    Review of Systems   She denies frontal headache, facial pain, nasal purulence  Skin: Warm & dry w/o jaundice or tenting.    She has felt hot and had profuse diaphoresis. She does not have a thermometer to check her temperature     Objective:   Physical Exam General appearance:fatigued appearing ;well nourished; no acute distress or increased work of breathing is present.  No  lymphadenopathy about the head, neck, or axilla noted.   Eyes: No conjunctival inflammation or lid edema is present. There is no scleral icterus.  Ears:  External ear exam shows no significant lesions or deformities.  Otoscopic examination reveals clear canals, tympanic membranes are intact bilaterally without bulging, retraction, inflammation or discharge. Both tympanic membranes exhibit significant scarring inferiorly  Nose:  External nasal examination shows no deformity or inflammation. Nasal mucosa are dry without lesions or exudates. No septal dislocation or deviation.No obstruction to airflow.   Oral exam: Dental hygiene is good; lips and gums are healthy appearing.There is no oropharyngeal erythema or exudate noted.    Heart:  Normal rate and regular rhythm. S1 and S2 normal without gallop, murmur, click, rub or other extra  sounds.   Lungs:Chest clear to auscultation; no wheezes, rhonchi,rales ,or rubs present.No increased work of breathing. Dry paroxysmal cough present  Extremities:  No cyanosis, edema, or clubbing  noted          Assessment & Plan:  #   #1 bronchitis, no associated rhinosinusitis  #2 multiple drug allergies/intolerances limiting therapy  Plan: See orders and recommendations

## 2011-09-06 ENCOUNTER — Telehealth: Payer: Self-pay | Admitting: *Deleted

## 2011-09-06 ENCOUNTER — Ambulatory Visit (INDEPENDENT_AMBULATORY_CARE_PROVIDER_SITE_OTHER): Payer: 59 | Admitting: Internal Medicine

## 2011-09-06 ENCOUNTER — Encounter: Payer: Self-pay | Admitting: Internal Medicine

## 2011-09-06 VITALS — BP 126/90 | HR 89 | Temp 98.6°F | Wt 179.4 lb

## 2011-09-06 DIAGNOSIS — R197 Diarrhea, unspecified: Secondary | ICD-10-CM

## 2011-09-06 DIAGNOSIS — J9801 Acute bronchospasm: Secondary | ICD-10-CM

## 2011-09-06 MED ORDER — BUDESONIDE-FORMOTEROL FUMARATE 160-4.5 MCG/ACT IN AERO: INHALATION_SPRAY | RESPIRATORY_TRACT | Status: DC

## 2011-09-06 MED ORDER — ALBUTEROL SULFATE HFA 108 (90 BASE) MCG/ACT IN AERS: 2.0000 | INHALATION_SPRAY | RESPIRATORY_TRACT | Status: DC | PRN

## 2011-09-06 NOTE — Telephone Encounter (Signed)
Pt is being seen today 

## 2011-09-06 NOTE — Progress Notes (Signed)
  Subjective:    Patient ID: Sarah Salazar, female    DOB: 1955/10/06, 56 y.o.   MRN: [id number]  HPI She has a paroxysmal nonproductive cough. This does result in chest discomfort for which she takes medications with subsequent somnolence.  She has a history of reactive airways disease, particularly with fumes or smells. Advair one medication every 12 hours has been of benefit  She's completed a course of Zithromax and doxycycline.  I personally reviewed chest Xrays; they were interpreted as showing increased markings,  possibly bronchitis. Most of the accentuated markings appear be in the area of the breast tissue & are less obvious on lateral film.  CBC and differential was ordered 51/13 to lab core. Apparently this was entered under the wrong account # &  was not received until we called  today. Her white count was normal at 5100. Her lymphocyte count is 55 & neutrophils 39. This suggests possible recent viral process, nonbacterial.  She's had 4 liquid bowel movements in the last 2 days.    Review of Systems She is not having frontal headaches, facial pain, or nasal purulence. She is not having extrinsic symptoms of itchy watery eyes or sneezing. She had seen an allergist approximately 15 years ago who diagnosed reactive airways disease.      Objective:   Physical Exam General appearance:good health ;well nourished; no acute distress or increased work of breathing is present.  No  lymphadenopathy about the head, neck, or axilla noted.   Eyes: No conjunctival inflammation or lid edema is present. There is no scleral icterus.  Ears:  External ear exam shows no significant lesions or deformities.  Otoscopic examination reveals clear canals, tympanic membranes are intact bilaterally without bulging, retraction, inflammation or discharge. Minor tympanic membrane scarring he  Nose:  External nasal examination shows no deformity or inflammation. Nasal mucosa are pink and moist without lesions  or exudates. No septal dislocation or deviation.No obstruction to airflow.   Oral exam: Dental hygiene is good; lips and gums are healthy appearing.There is no oropharyngeal erythema or exudate noted.   Neck:  No deformities, thyromegaly, masses, or tenderness noted.     Heart:  Normal rate and regular rhythm. S1 and S2 normal without gallop, murmur, click, rub S 4   Lungs:Chest clear to auscultation; no wheezes, rhonchi,rales ,or rubs present.No increased work of breathing.  Intermittent paroxysmal, nonproductive cough  Abdomen:Bowel sounds are normal. Abdomen is soft and nontender with no organomegaly, hernias  or masses.     Extremities:  No cyanosis, edema, or clubbing  noted    Skin: Warm & dry w/o jaundice or tenting.          Assessment & Plan:  #1 cough, asthmatic variant. There is no evidence of active infection at this time and she is not communicable.  #2 diarrhea; most likely related to antibiotic therapy  Plan: See orders recommendations

## 2011-09-06 NOTE — Patient Instructions (Signed)
Stay on clear liquids for 48-72 hours or until bowels are normal.This would include  jello, sherbert (NOT ice cream), Lipton's chicken noodle soup(NOT cream based soups),Gatorade Lite, flat Ginger ale (without High Fructose Corn Syrup),dry toast or crackers, baked potato.No milk , dairy or grease until bowels are formed. Align , a Pro Biotic , daily if stools are loose. Immodium AD for frankly watery stool. Report increasing pain, fever or rectal bleeding 

## 2011-09-06 NOTE — Telephone Encounter (Signed)
[facility] Triage Call Report Triage Record Num: [id number] Operator: Tomasita Crumble Patient Name: Sarah Salazar Call Date & Time: 09/05/2011 4:01:12PM Patient Phone: [phone] PCP: Marga Melnick Patient Gender: Female PCP Fax : [phone] Patient DOB: 03-20-1956 Practice Name: [facility] Reason for Call: Caller: Sarah Salazar/Patient; PCP: Marga Melnick; CB#: [phone]; Call regarding Diarrhea, Cough; Relates she has been sick over 14 days. States she must take Robitussin by the clock to keep cough under control. Sounds hoarse and states it has been that way for 2 weeks. Diarrhea x 4 in 48 hours; on antibiotics. Emergent sx ruled out. Home care for the interim and follow up with provider in 24 hrs per Diarrhea and Cough protocols. Appt. at 11:30 on 09/06/11 with Dr. Alwyn Ren. Protocol(s) Used: Cough - Adult Recommended Outcome per Protocol: See Provider within 24 hours Reason for Outcome: Sharp, fleeting chest pain only occurring with deep breath or cough AND not responsive to home care Care Advice: ~ Use a cool mist humidifier to moisten air. Be sure to clean according to manufacturer's instructions. ~ Limit activities and increase periods of rest. Limit or avoid exposure to irritants and allergens (e.g. air pollution, smoke/smoking, chemicals, dust, pollen, pet dander, etc.) ~ Call provider if fever greater than 101.5 F (38.6 C) or 100.5 F (38.1C) in an immunocompromised patient (such as diabetes, HIV/AIDS, renal disease, chemotherapy, organ transplant, or chronic steroid use) has not improved in 24 hours. ~ ~ CAUTIONS ~ List, or take, all current prescription(s), nonprescription or alternative medication(s) to provider for evaluation. Analgesic/Antipyretic Advice - Acetaminophen: Consider acetaminophen as directed on label or by pharmacist/provider for pain or fever PRECAUTIONS: - Use if there is no history of liver disease, alcoholism, or intake of three or more  alcohol drinks per day - Only if approved by provider during pregnancy or when breastfeeding - During pregnancy, acetaminophen should not be taken more than 3 consecutive days without telling provider - Do not exceed recommended dose or frequency ~ Analgesic/Antipyretic Advice - NSAIDs: Consider aspirin, ibuprofen, naproxen or ketoprofen for pain or fever as directed on label or by pharmacist/provider. PRECAUTIONS: - If over 63 years of age, should not take longer than 1 week without consulting provider. EXCEPTIONS: - Should not be used if taking blood thinners or have bleeding problems. - Do not use if have history of sensitivity/allergy to any of these medications; or history of cardiovascular, ulcer, kidney, liver disease or diabetes unless approved by provider. - Do not exceed recommended dose or frequency. ~ 09/05/2011 4:28:33PM

## 2011-09-07 ENCOUNTER — Emergency Department (HOSPITAL_BASED_OUTPATIENT_CLINIC_OR_DEPARTMENT_OTHER)
Admission: EM | Admit: 2011-09-07 | Discharge: 2011-09-08 | Disposition: A | Discharge status: Home / Self Care | Payer: 59 | Attending: Emergency Medicine | Admitting: Emergency Medicine

## 2011-09-07 DIAGNOSIS — E079 Disorder of thyroid, unspecified: Secondary | ICD-10-CM | POA: Insufficient documentation

## 2011-09-07 DIAGNOSIS — Z79899 Other long term (current) drug therapy: Secondary | ICD-10-CM | POA: Insufficient documentation

## 2011-09-07 DIAGNOSIS — J40 Bronchitis, not specified as acute or chronic: Secondary | ICD-10-CM

## 2011-09-07 DIAGNOSIS — K589 Irritable bowel syndrome without diarrhea: Secondary | ICD-10-CM | POA: Insufficient documentation

## 2011-09-07 DIAGNOSIS — F341 Dysthymic disorder: Secondary | ICD-10-CM | POA: Insufficient documentation

## 2011-09-07 NOTE — ED Notes (Signed)
Pt c/o cough, sinus drainage and congestion with watery eyes x2 weeks. Has seen her PCP 4 times in the last two weeks for this and was told it was a viral bronchitis. Was prescribed inhalers and other meds without relief. Pt here to be re-evaluated. Pt is unable to be seen by her PCP for another couple weeks.

## 2011-09-07 NOTE — ED Notes (Signed)
PMD is trying to get pt an appt with Pulmonologist.  Appt not set yet.  Pt sts she has been out of work for 2 weeks and she cannot afford to be out any longer.  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  Sts she is "grasping at straws" hoping that someone here will have another idea or another medicine that might help so she can go back to work.

## 2011-09-08 NOTE — ED Provider Notes (Signed)
History     CSN: [id number]  Arrival date & time 09/07/11  2233   First MD Initiated Contact with Patient 09/08/11 0004      Chief Complaint  Patient presents with  . Cough    (Consider location/radiation/quality/duration/timing/severity/associated sxs/prior treatment) HPI This is a 56 year old white female with a two-week history of a nonproductive cough along with sinus drainage. She has seen her primary care physician several times and has been treated with albuterol, QVAR, Symbicort, Advair, Zithromax and doxycycline. Despite these treatments she continues to have a persistent cough. 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 The symptoms are moderate to severe. They're associated with chest soreness. She has not been having fevers. Her primary care physician is trying to get her into a pulmonologist but this likely will not happen until the end of the month. She came her tonight for a second opinion as she is frustrated and lack of improvement.  Past Medical History  Diagnosis Date  . IBS (irritable bowel syndrome)   . Anxiety   . Depression   . Thyroid disease     hypothyroidism    Past Surgical History  Procedure Date  . Cholecystectomy   . Dilation and curettage of uterus     x3  . Knee surgery     x2  . Wrist surgery     x3  . Laparoscopy 06/03/1990    to evaluate dysfunctional menses & pelvic pain  . Breast lumpectomy     left  . Cystocele repair 01/13/09  . Upper gastrointestinal endoscopy 2002  . Colonoscopy 2002  . Mastectomy, partial     benign fibrocystic changes    Family History  Problem Relation Age of Onset  . Coronary artery disease Mother   . Stroke Mother     TIA  . Cancer Mother     BREAST  . Cancer Father     LUNG  . Heart disease Father   . Cancer Paternal Uncle     COLON; STOMACH  . COPD Father   . Thyroid disease Mother     goiter    History  Substance Use Topics  .  Smoking status: Never Smoker   . Smokeless tobacco: Not on file  . Alcohol Use: No    OB History    Grav Para Term Preterm Abortions TAB SAB Ect Mult Living                  Review of Systems  All other systems reviewed and are negative.    Allergies  Amoxicillin; Benzonatate; Fentanyl; Midazolam; Oxycodone-aspirin; Sulfonamide derivatives; Hydrocodone; Nalbuphine; Oxycodone-acetaminophen; and Venlafaxine  Home Medications   Current Outpatient Rx  Name Route Sig Dispense Refill  . ALBUTEROL SULFATE HFA 108 (90 BASE) MCG/ACT IN AERS Inhalation Inhale 2 puffs into the lungs every 4 (four) hours as needed. 1 Inhaler 0  . ALPRAZOLAM 0.5 MG PO TABS Oral Take 0.25-0.5 mg by mouth 2 (two) times daily as needed. For anxiety    . BECLOMETHASONE DIPROPIONATE 80 MCG/ACT IN AERS Inhalation Inhale 2 puffs into the lungs 2 (two) times daily as needed. For shortness of breath    . BUDESONIDE-FORMOTEROL FUMARATE 160-4.5 MCG/ACT IN AERO  Gargle and spit after use 1 Inhaler 3  . BUPROPION HCL 100 MG PO TABS Oral Take 100 mg by mouth 3 (three) times daily.      Marland Kitchen HYOSCYAMINE SULFATE 0.125 MG  PO TBDP Sublingual Place 0.125 mg under the tongue every 4 (four) hours as needed. For stomach spasms     . IBUPROFEN 800 MG PO TABS Oral Take 800 mg by mouth every 8 (eight) hours as needed. For pain     . LAMOTRIGINE 150 MG PO TABS Oral Take 300 mg by mouth at bedtime as needed. For bi polar disorder    . LEVOTHYROXINE SODIUM 100 MCG PO TABS Oral Take 1 tablet (100 mcg total) by mouth daily. 90 tablet 1  . POTASSIUM CHLORIDE CRYS ER 20 MEQ PO TBCR Oral Take 40-60 mEq by mouth daily. 2 tablets once daily EXCEPT 3 tablets on Tuesday and Saturday    . RANITIDINE HCL 150 MG PO CAPS Oral Take 1 capsule (150 mg total) by mouth 2 (two) times daily. 180 capsule 1  . DOXYCYCLINE HYCLATE 100 MG PO TABS Oral Take 1 tablet (100 mg total) by mouth 2 (two) times daily. Avoid direct sun 20 tablet 0  . FLUTICASONE-SALMETEROL  250-50 MCG/DOSE IN AEPB Inhalation Inhale 1 puff into the lungs 2 (two) times daily. 14 each 0  . GABAPENTIN 100 MG PO CAPS Oral Take 100 mg by mouth 3 (three) times daily.      Marland Kitchen HYDROXYZINE PAMOATE 50 MG PO CAPS Oral Take 50 mg by mouth at bedtime as needed. For sleep      BP 160/96  Pulse 95  Temp(Src) 99.4 F (37.4 C) (Oral)  Resp 20  Ht 5\' 4"  (1.626 m)  Wt 178 lb (80.74 kg)  BMI 30.55 kg/m2  SpO2 98%  Physical Exam General: Well-developed, well-nourished female in no acute distress; appearance consistent with age of record HENT: normocephalic, atraumatic; no pharyngeal erythema or exudate Eyes: pupils equal round and reactive to light; extraocular muscles intact Neck: supple Heart: regular rate and rhythm Lungs: clear to auscultation bilaterally; dry cough Abdomen: soft; nondistended; nontender Extremities: No deformity; full range of motion; pulses normal Neurologic: Awake, alert and oriented; motor function intact in all extremities and symmetric; no facial droop Skin: Warm and dry     ED Course  Procedures (including critical care time)     MDM  The patient was advised that there is no additional medication to try. It was verified with her that she is not on an ACE inhibitor as these can cause chronic cough. She was advised that at this point pulmonology consultation is indicated and she should persist in her attempts to secure an appointment.          Hanley Seamen, MD 09/08/11 [phone]

## 2011-09-08 NOTE — Discharge Instructions (Signed)

## 2011-09-10 ENCOUNTER — Encounter: Payer: Self-pay | Admitting: Pulmonary Disease

## 2011-09-10 ENCOUNTER — Ambulatory Visit (INDEPENDENT_AMBULATORY_CARE_PROVIDER_SITE_OTHER): Payer: 59 | Admitting: Pulmonary Disease

## 2011-09-10 VITALS — BP 138/84 | HR 80 | Temp 98.5°F | Ht 64.0 in | Wt 189.2 lb

## 2011-09-10 DIAGNOSIS — R059 Cough, unspecified: Secondary | ICD-10-CM

## 2011-09-10 DIAGNOSIS — R053 Chronic cough: Secondary | ICD-10-CM

## 2011-09-10 DIAGNOSIS — R05 Cough: Secondary | ICD-10-CM | POA: Insufficient documentation

## 2011-09-10 MED ORDER — TRAMADOL HCL 50 MG PO TABS: 50.0000 mg | ORAL_TABLET | Freq: Four times a day (QID) | ORAL | Status: AC | PRN

## 2011-09-10 NOTE — Assessment & Plan Note (Signed)
The patient has a chronic cough of 3 weeks' duration that is clearly upper airway in origin.  I suspect that she either has a sinusitis, or possibly ongoing postnasal drip from rhinitis.  I think she also has a cyclical cough, and may have a contribution from reflux based on her symptomatology.  At this point, I think we need to minimize stimulation to her upper airway.  Would like to treat aggressively for postnasal drip, and would emperically treat her with a proton pump inhibitor for cough induced reflux disease.  Finally, I have reviewed the behavioral therapies that can help with cyclical coughing.  Her lungs are totally clear today, and I doubt this has anything to do with asthma.  I have asked her to stop all of her inhalers since these are very irritating to the upper airway.  If she continues to have issues, will need a scan of her sinuses to rule out chronic sinusitis.  She may need spirometry as well.

## 2011-09-10 NOTE — Patient Instructions (Addendum)
Stop ALL inhalers. Stop prior sinus medicine over the counter and take chlorpheniramine 8mg  at bedtime and lunch for next 2 weeks. neilmed sinus rinses am and pm for next 2 weeks.  Can get refill packets at drugstore. NO throat clearing, use hard candy only from the time you wake up till you go to bed to bathe back of throat. Limit voice use as much as possible, no singing, no yelling Use tramadol 50mg  every 6 hrs if needed for cough. Stop ranitidine.  Take dexilant 60mg  one each am until gone.  Will see you back in 2 weeks, but if no better, may need to image your sinuses to evaluate for chronic sinusitis.

## 2011-09-10 NOTE — Progress Notes (Signed)
  Subjective:    Patient ID: CACI ORREN, female    DOB: [birth year], 56 y.o.   MRN: [id number]  HPI The patient is a 56 year old female who I had been asked to see for chronic cough.  The patient has a questionable history of asthma, and gives a three-week history of progressive dry hacking cough.  It started with exposure to strong perfume, followed by nasal congestion with postnasal drip and also pressure in her ears.  She then developed a sore throat with hoarseness.  She has been treated with a Z-Pak and course of prednisone, then doxycycline, with no improvement.  The cough is dry and hacking in nature, and worsened with strong odors and also increased conversation.  The patient notes postnasal drip and throat clearing, but has had no purulence from her nose or chest.  She has no chest congestion or shortness of breath.  In fact, the patient feels better today than she has the last 3 weeks.  She does have some reflux at times but it is not a daily occurrence.  She has had a recent chest x-ray that is unremarkable.  She has not had pulmonary function studies in over 15 years.   Review of Systems  Constitutional: Positive for unexpected weight change. Negative for fever.  HENT: Positive for ear pain, congestion, sore throat, sneezing and voice change. Negative for nosebleeds, rhinorrhea, trouble swallowing, dental problem, postnasal drip and sinus pressure.   Eyes: Negative.  Negative for redness and itching.  Respiratory: Positive for cough. Negative for chest tightness, shortness of breath and wheezing.   Cardiovascular: Positive for chest pain. Negative for palpitations and leg swelling.  Gastrointestinal: Negative for nausea and vomiting.       Heartburn indigestion  Genitourinary: Negative.  Negative for dysuria.  Musculoskeletal: Negative.  Negative for joint swelling.  Skin: Negative.  Negative for rash.  Neurological: Positive for headaches.  Hematological: Negative.  Does not  bruise/bleed easily.  Psychiatric/Behavioral: Negative for dysphoric mood. The patient is nervous/anxious.        Objective:   Physical Exam Constitutional:  Obese female, no acute distress  HENT:  Nares patent with swollen mucosa, boggy, but no purulence.   Oropharynx without exudate, palate and uvula are normal  Eyes:  Perrla, eomi, no scleral icterus  Neck:  No JVD, no TMG  Cardiovascular:  Normal rate, regular rhythm, no rubs or gallops.  No murmurs        Intact distal pulses  Pulmonary :  Normal breath sounds, no stridor or respiratory distress   No rales, rhonchi, or wheezing  Abdominal:  Soft, nondistended, bowel sounds present.  No tenderness noted.   Musculoskeletal:  No lower extremity edema noted.  Lymph Nodes:  No cervical lymphadenopathy noted  Skin:  No cyanosis noted  Neurologic:  Alert, appropriate, moves all 4 extremities without obvious deficit.         Assessment & Plan:

## 2011-09-21 ENCOUNTER — Telehealth: Payer: Self-pay | Admitting: Pulmonary Disease

## 2011-09-21 NOTE — Telephone Encounter (Signed)
I spoke with Elease Hashimoto and she states that per pt she mailed  [facility] her release of information and is wanting to know if we have received this. Lawson Fiscal have you seen this, please advise thanks

## 2011-09-21 NOTE — Telephone Encounter (Signed)
Lm for Sarah Salazar tcb

## 2011-09-21 NOTE — Telephone Encounter (Signed)
Sarah Salazar is aware that I have not seen a release from this patient. Sarah Salazar has spoke with the patient and she is going to send another release to the patient and have her return this to [employer] directly. Nothing further is needed from our office at this time.

## 2011-09-24 ENCOUNTER — Telehealth: Payer: Self-pay | Admitting: Internal Medicine

## 2011-09-24 NOTE — Telephone Encounter (Signed)
I have asked Ruby McClinton to fax anything she has for this patient to me so I can review.

## 2011-09-24 NOTE — Telephone Encounter (Signed)
Dr.Hopper please advise 

## 2011-09-24 NOTE — Telephone Encounter (Signed)
This does not sound like a form for which I would normally charge. I typically only charge for home health recertification forms. Please followup; thank you

## 2011-09-24 NOTE — Telephone Encounter (Signed)
Is this something you have a copy of & can send to me via fax at [phone], please. I will ask Dr.Hopper about it on Tuesday as he is gone today  Thanks

## 2011-09-24 NOTE — Telephone Encounter (Signed)
Pt states she should not have been billed for the completion of her physician statement forms because they were requested by the insurance company. Pt has been charged and does not believe this is correct. Call back # [phone] or mobile # between 3 and 4.

## 2011-09-29 ENCOUNTER — Encounter: Payer: Self-pay | Admitting: Pulmonary Disease

## 2011-09-29 ENCOUNTER — Ambulatory Visit (INDEPENDENT_AMBULATORY_CARE_PROVIDER_SITE_OTHER): Payer: 59 | Admitting: Pulmonary Disease

## 2011-09-29 VITALS — BP 150/88 | HR 84 | Temp 98.5°F | Ht 64.0 in | Wt 180.8 lb

## 2011-09-29 DIAGNOSIS — R059 Cough, unspecified: Secondary | ICD-10-CM

## 2011-09-29 DIAGNOSIS — R053 Chronic cough: Secondary | ICD-10-CM

## 2011-09-29 DIAGNOSIS — R05 Cough: Secondary | ICD-10-CM

## 2011-09-29 MED ORDER — OMEPRAZOLE 40 MG PO CPDR: 40.0000 mg | DELAYED_RELEASE_CAPSULE | Freq: Every day | ORAL | Status: DC

## 2011-09-29 MED ORDER — TRAMADOL HCL 50 MG PO TABS: ORAL_TABLET | ORAL | Status: DC

## 2011-09-29 NOTE — Assessment & Plan Note (Signed)
The patient's cough is significantly improved with treatment of upper airway irritation.  She is however, still requiring tramadol in order to minimize her cough.  She is not taking her antihistamine, and I have stressed the importance of this to her to prevent postnasal drip and subsequent irritation.  I would also like her to stay on a proton pump inhibitor until her cough has totally resolved and she is off tramadol.  Finally, I have emphasized to her the behavioral therapies as before, including minimizing voice use, no throat clearing, and hard candy to bathe the back of the throat.  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  She may also need spirometry off all inhalers in the near future if symptoms continue.

## 2011-09-29 NOTE — Patient Instructions (Signed)
Will keep you on something for acid reflux until we are able to get you off tramadol.  Would like you to take omeprazole 40mg  each am. Get chlorpheniramine 4mg  over the counter and take 8mg  at bedtime and 4mg  at lunch No throat clearing, minimize voice use, use hard candy to bathe back of throat to minimize irritation. Begin to wean off tramadol over the next 3-4 weeks.   Please call me in 4 weeks with how things are going.

## 2011-09-29 NOTE — Progress Notes (Signed)
  Subjective:    Patient ID: Sarah Salazar, female    DOB: 1955-09-01, 56 y.o.   MRN: [id number]  HPI The patient comes in today for followup of her known chronic cough.  She is felt to have the upper airway dysfunction syndrome, and at the last visit we treated her for postnasal drip, reflux, and behavioral therapies to prevent cyclical coughing.  She feels that she is 75-80% improved since last visit, but is continuing to need tramadol to keep the cough contained.  She is also continuing to clear her throat, however she never took the antihistamine as outlined.  She is continuing to have postnasal drip, and also some sinus pressure.   Review of Systems  Constitutional: Negative.  Negative for fever and unexpected weight change.  HENT: Positive for congestion. Negative for ear pain, nosebleeds, sore throat, rhinorrhea, sneezing, trouble swallowing, dental problem, postnasal drip and sinus pressure.   Eyes: Negative.  Negative for redness and itching.  Respiratory: Positive for cough. Negative for chest tightness, shortness of breath and wheezing.   Cardiovascular: Negative.  Negative for palpitations and leg swelling.  Gastrointestinal: Negative.  Negative for nausea and vomiting.  Genitourinary: Negative.  Negative for dysuria.  Musculoskeletal: Negative.  Negative for joint swelling.  Skin: Negative.  Negative for rash.  Neurological: Positive for headaches.  Hematological: Negative.  Does not bruise/bleed easily.  Psychiatric/Behavioral: Negative.  Negative for dysphoric mood. The patient is not nervous/anxious.        Objective:   Physical Exam Overweight female in no acute distress Nose without purulence or discharge noted Chest is clear to auscultation Cardiac exam with regular rate and rhythm Lower extremities without edema, no cyanosis Alert and oriented, moves all 4 extremities.       Assessment & Plan:

## 2011-09-30 ENCOUNTER — Institutional Professional Consult (permissible substitution): Payer: 59 | Admitting: Pulmonary Disease

## 2011-09-30 NOTE — Telephone Encounter (Signed)
Received documents via fax from patient. Insurance company sent direct to HIM, they sent bill to patient to pay prior to records being sent. Spoke to Dr.Hopper he said he would gladly provide at no charge office notes from visits, but could not fill out Disability papers for patient as he did not advise patient to stay out of work that long. I called patient back and advised her of Hopper's decision via voicemail and asked her to return my call so I could either fax OV notes or mail directly to her

## 2011-10-27 ENCOUNTER — Ambulatory Visit (INDEPENDENT_AMBULATORY_CARE_PROVIDER_SITE_OTHER): Payer: 59 | Admitting: Pulmonary Disease

## 2011-10-27 ENCOUNTER — Encounter: Payer: Self-pay | Admitting: Pulmonary Disease

## 2011-10-27 VITALS — BP 150/80 | HR 97 | Temp 98.4°F | Ht 65.0 in | Wt 181.8 lb

## 2011-10-27 DIAGNOSIS — R053 Chronic cough: Secondary | ICD-10-CM

## 2011-10-27 DIAGNOSIS — R05 Cough: Secondary | ICD-10-CM

## 2011-10-27 DIAGNOSIS — R059 Cough, unspecified: Secondary | ICD-10-CM

## 2011-10-27 NOTE — Progress Notes (Signed)
  Subjective:    Patient ID: Sarah Salazar, female    DOB: 12-Aug-1955, 56 y.o.   MRN: [id number]  HPI The patient comes in today for followup of her chronic cough.  This is felt secondary to the upper airway cough syndrome, probably secondary to reflux disease, possibly postnasal drip, and also cyclical coughing.  The patient has been treated aggressively for reflux, as well as behavioral therapies for cyclical coughing, and has seen almost complete resolution of her cough.  She will have occasional breakthrough with strong odors, but overall her cough is not an issue at this time.   Review of Systems  Constitutional: Negative for fever and unexpected weight change.  HENT: Negative for ear pain, nosebleeds, congestion, sore throat, rhinorrhea, sneezing, trouble swallowing, dental problem, postnasal drip and sinus pressure.   Eyes: Negative for redness and itching.  Respiratory: Positive for cough. Negative for chest tightness, shortness of breath and wheezing.   Cardiovascular: Negative for palpitations and leg swelling.  Gastrointestinal: Negative for nausea and vomiting.  Genitourinary: Negative for dysuria.  Musculoskeletal: Negative for joint swelling.  Skin: Negative for rash.  Neurological: Positive for headaches.  Hematological: Does not bruise/bleed easily.  Psychiatric/Behavioral: Positive for dysphoric mood. The patient is nervous/anxious.        Objective:   Physical Exam Overweight female in no acute distress Nose without purulence or discharge noted Lower extremities with mild edema, no cyanosis Alert and oriented, moves all 4 extremities.       Assessment & Plan:

## 2011-10-27 NOTE — Patient Instructions (Addendum)
Continue on your current acid reflux medication at once a day.  If you have increased cough, increase to twice a day Use chlorpheniramine 8mg  at bedtime if you are having postnasal drip. Use voice limitation, hard candy, no throat clearing if you have escalation of cough.  followup with me as needed.

## 2011-10-27 NOTE — Assessment & Plan Note (Signed)
The patient has the upper airway cough syndrome that I suspect is secondary to reflux disease and cyclical coughing.  She is doing fairly well on once a day proton pump inhibitor currently, but I told her to increase this to twice a day if the cough returns.  I have also asked her to keep in mind the behavioral therapies for cyclical coughing if her cough begins to escalate again.  She also needs to keep in mind treatment for postnasal drip if this becomes an issue.  If her cough continues to be an issue without resolution, would consider spirometry and also imaging of her sinuses.

## 2011-12-20 ENCOUNTER — Other Ambulatory Visit: Payer: Self-pay | Admitting: Internal Medicine

## 2011-12-20 DIAGNOSIS — E039 Hypothyroidism, unspecified: Secondary | ICD-10-CM

## 2011-12-20 NOTE — Telephone Encounter (Signed)
HYOSCYAMINE NEW PRES REQUEST

## 2011-12-21 MED ORDER — HYOSCYAMINE SULFATE 0.125 MG PO TBDP: 0.1250 mg | ORAL_TABLET | ORAL | Status: DC | PRN

## 2011-12-21 MED ORDER — LEVOTHYROXINE SODIUM 100 MCG PO TABS: 100.0000 ug | ORAL_TABLET | Freq: Every day | ORAL | Status: DC

## 2011-12-21 NOTE — Telephone Encounter (Signed)
Refill: Levothyroxine tb 0.1mg  #90. Take 1 tablet by mouth daily. Last fill 10-04-11

## 2011-12-21 NOTE — Telephone Encounter (Signed)
#   30 ; 1 sublingually every 4 hrs prn abd  cramping

## 2011-12-21 NOTE — Telephone Encounter (Signed)
Rx sent 

## 2011-12-21 NOTE — Telephone Encounter (Signed)
Thyroid medication filled, Dr.Hopper please advise on Hyoscyamine (it is on medication list, not filled by you)

## 2011-12-27 NOTE — Addendum Note (Signed)
Addended by: Maurice Small on: 12/27/2011 11:16 AM   Modules accepted: Orders

## 2012-04-04 ENCOUNTER — Telehealth: Payer: Self-pay | Admitting: Internal Medicine

## 2012-04-04 ENCOUNTER — Ambulatory Visit: Payer: 59 | Admitting: Internal Medicine

## 2012-04-04 ENCOUNTER — Emergency Department (HOSPITAL_BASED_OUTPATIENT_CLINIC_OR_DEPARTMENT_OTHER): Payer: 59

## 2012-04-04 ENCOUNTER — Encounter (HOSPITAL_BASED_OUTPATIENT_CLINIC_OR_DEPARTMENT_OTHER): Payer: Self-pay | Admitting: Family Medicine

## 2012-04-04 ENCOUNTER — Emergency Department (HOSPITAL_BASED_OUTPATIENT_CLINIC_OR_DEPARTMENT_OTHER)
Admission: EM | Admit: 2012-04-04 | Discharge: 2012-04-04 | Disposition: A | Discharge status: Home / Self Care | Payer: 59 | Attending: Emergency Medicine | Admitting: Emergency Medicine

## 2012-04-04 DIAGNOSIS — K589 Irritable bowel syndrome without diarrhea: Secondary | ICD-10-CM | POA: Insufficient documentation

## 2012-04-04 DIAGNOSIS — F411 Generalized anxiety disorder: Secondary | ICD-10-CM | POA: Insufficient documentation

## 2012-04-04 DIAGNOSIS — Z8659 Personal history of other mental and behavioral disorders: Secondary | ICD-10-CM | POA: Insufficient documentation

## 2012-04-04 DIAGNOSIS — Z9889 Other specified postprocedural states: Secondary | ICD-10-CM | POA: Insufficient documentation

## 2012-04-04 DIAGNOSIS — Z79899 Other long term (current) drug therapy: Secondary | ICD-10-CM | POA: Insufficient documentation

## 2012-04-04 DIAGNOSIS — Z8669 Personal history of other diseases of the nervous system and sense organs: Secondary | ICD-10-CM | POA: Insufficient documentation

## 2012-04-04 DIAGNOSIS — E079 Disorder of thyroid, unspecified: Secondary | ICD-10-CM | POA: Insufficient documentation

## 2012-04-04 DIAGNOSIS — R51 Headache: Secondary | ICD-10-CM

## 2012-04-04 MED ORDER — ONDANSETRON 8 MG PO TBDP
8.0000 mg | ORAL_TABLET | Freq: Once | ORAL | Status: AC
  Administered 2012-04-04: 8 mg via ORAL
  Filled 2012-04-04: qty 1

## 2012-04-04 MED ORDER — HYDROMORPHONE HCL PF 1 MG/ML IJ SOLN
1.0000 mg | Freq: Once | INTRAMUSCULAR | Status: AC
  Administered 2012-04-04: 1 mg via INTRAMUSCULAR
  Filled 2012-04-04: qty 1

## 2012-04-04 NOTE — Telephone Encounter (Signed)
I reviewed system, patient seen in emergency room

## 2012-04-04 NOTE — Telephone Encounter (Signed)
I reviewed system and did not see where patient was seen by one of our [facility] facilities. I called patient and left message for her to return call when available, reason for call was to see if patient received medical help

## 2012-04-04 NOTE — ED Provider Notes (Addendum)
History     CSN: [id number]  Arrival date & time 04/04/12  1156   First MD Initiated Contact with Patient 04/04/12 1220      Chief Complaint  Patient presents with  . Headache    (Consider location/radiation/quality/duration/timing/severity/associated sxs/prior treatment) Patient is a 56 y.o. female presenting with headaches. The history is provided by the patient.  Headache  Pertinent negatives include no fever, no shortness of breath and no vomiting.  pt c/o headache, gradual onset 4-5 days ago. Was mild at onset, slowly worse. No acute or abrupt change today. Located towards posterior scalp. Constant. Dull. Moderate-severe. 7/10. No specific exacerbating or alleviating factors. Not affected by position or time of day. Not her worse headache. States had a bad headache, worse than current, approximately 6-7 years ago for which was evaluated, had scans, and was told no definite cause. Denies hx migraines. No hx ich or aneurysm. Denies eye pain or change in vision. No numbness or weakness. No problems w gait, balance or coordination. No recent head injury or fall. No syncope. Denies sinus pain or congestion. No uri c/o. No fever or chills.       Past Medical History  Diagnosis Date  . IBS (irritable bowel syndrome)   . Anxiety   . Depression   . Thyroid disease     hypothyroidism  . Migraine     Past Surgical History  Procedure Date  . Cholecystectomy   . Dilation and curettage of uterus     x3  . Knee surgery     x2  . Wrist surgery     x3  . Laparoscopy 06/03/1990    to evaluate dysfunctional menses & pelvic pain  . Breast lumpectomy     left  . Cystocele repair 01/13/09  . Upper gastrointestinal endoscopy 2002  . Colonoscopy 2002  . Mastectomy, partial     benign fibrocystic changes    Family History  Problem Relation Age of Onset  . Coronary artery disease Mother   . Stroke Mother     TIA  . Uterine cancer Mother     BREAST  . Lung cancer Father     LUNG   . Heart disease Father   . Colon cancer Paternal Uncle     COLON; STOMACH  . COPD Father   . Thyroid disease Mother     goiter  . Lung cancer Maternal Grandfather   . Coronary artery disease Father   . Rheum arthritis Maternal Aunt     History  Substance Use Topics  . Smoking status: Never Smoker   . Smokeless tobacco: Never Used  . Alcohol Use: Yes     Comment: occ    OB History    Grav Para Term Preterm Abortions TAB SAB Ect Mult Living                  Review of Systems  Constitutional: Negative for fever and chills.  HENT: Negative for neck pain.   Eyes: Negative for pain and visual disturbance.  Respiratory: Negative for shortness of breath.   Cardiovascular: Negative for chest pain.  Gastrointestinal: Negative for vomiting and abdominal pain.  Genitourinary: Negative for flank pain.  Musculoskeletal: Negative for back pain.  Skin: Negative for rash.  Neurological: Positive for headaches. Negative for syncope, speech difficulty, weakness and numbness.  Hematological: Does not bruise/bleed easily.  Psychiatric/Behavioral: Negative for confusion.    Allergies  Amoxicillin; Benzonatate; Fentanyl; Midazolam; Oxycodone-aspirin; Sulfonamide derivatives; Hydrocodone; Nalbuphine; Oxycodone-acetaminophen; and Venlafaxine  Home Medications   Current Outpatient Rx  Name  Route  Sig  Dispense  Refill  . ALPRAZOLAM 0.5 MG PO TABS   Oral   Take 0.25-0.5 mg by mouth 2 (two) times daily as needed. For anxiety         . BUPROPION HCL 100 MG PO TABS   Oral   Take 300 mg by mouth daily.          . DEXLANSOPRAZOLE 60 MG PO CPDR   Oral   Take 60 mg by mouth 2 (two) times daily.         Marland Kitchen HYDROXYZINE PAMOATE 50 MG PO CAPS   Oral   Take 50 mg by mouth at bedtime as needed. For sleep         . HYOSCYAMINE SULFATE 0.125 MG SL SUBL   Sublingual   Place 0.125 mg under the tongue every 4 (four) hours as needed.         . IBUPROFEN 800 MG PO TABS   Oral   Take  800 mg by mouth every 8 (eight) hours as needed. For pain          . LAMOTRIGINE 150 MG PO TABS   Oral   Take 300 mg by mouth at bedtime. For bi polar disorder         . LEVOTHYROXINE SODIUM 100 MCG PO TABS   Oral   Take 1 tablet (100 mcg total) by mouth daily.   90 tablet   0   . OMEPRAZOLE 40 MG PO CPDR   Oral   Take 1 capsule (40 mg total) by mouth daily.   30 capsule   3   . POTASSIUM CHLORIDE CRYS ER 20 MEQ PO TBCR   Oral   Take by mouth daily. 2 tablets once daily EXCEPT 3 tablets on  Tuesday, Thursday, and Sunday         . TRAMADOL HCL 50 MG PO TABS      1 by mouth every 6 hours as needed for cough   30 tablet   1     BP 159/103  Pulse 93  Temp 97.7 F (36.5 C) (Oral)  Resp 16  SpO2 100%  Physical Exam  Nursing note and vitals reviewed. Constitutional: She is oriented to person, place, and time. She appears well-developed and well-nourished. No distress.  HENT:  Head: Atraumatic.  Nose: Nose normal.  Mouth/Throat: Oropharynx is clear and moist.       No sinus or temporal tenderness. Tenderness to posterior scalp, no focal/trigger point felt.   Eyes: Conjunctivae normal and EOM are normal. Pupils are equal, round, and reactive to light. No scleral icterus.  Neck: Neck supple. No tracheal deviation present. No thyromegaly present.       No stiffness or rigidity.   Cardiovascular: Normal rate, regular rhythm, normal heart sounds and intact distal pulses.  Exam reveals no gallop and no friction rub.   No murmur heard. Pulmonary/Chest: Effort normal and breath sounds normal. No respiratory distress.  Abdominal: Soft. Normal appearance and bowel sounds are normal. She exhibits no distension. There is no tenderness.  Genitourinary:       No cva tenderness.  Musculoskeletal: Normal range of motion. She exhibits no edema and no tenderness.  Neurological: She is alert and oriented to person, place, and time. No cranial nerve deficit.       Motor intact  bilaterally. No facial droop. No pronator drift.  Steady gait.   Skin:  Skin is warm and dry. No rash noted. She is not diaphoretic.  Psychiatric: She has a normal mood and affect.    ED Course  Procedures (including critical care time)   Ct Head Wo Contrast  04/04/2012  *RADIOLOGY REPORT*  Clinical Data: Persistent severe headache  CT HEAD WITHOUT CONTRAST  Technique:  Contiguous axial images were obtained from the base of the skull through the vertex without contrast.  Comparison: December 03, 2009  Findings:  Ventricles are normal in size and configuration.  There is no mass, hemorrhage, extra-axial fluid collection, or midline shift.  The gray-white compartments are normal.  Bony calvarium appears intact.  The mastoid air cells are clear.  There is leftward deviation of the nasal septum with a bony spur extending toward the left, stable.  IMPRESSION: Nasal septal deviation.  Study otherwise unremarkable.   Original Report Authenticated By: Bretta Bang, M.D.       MDM  Dilaudid 1 mg im. zofran po.  Ct.   Ct neg. Headache gradual onset. Slowly worse. No fever/chills. No neck stiffness or rigidity.  Hx/exam does not appear c/w acute sah nor w meningitis or other acute infectious process. Muscular tenderness posterior scalp  Pt comfortable on recheck.   Discussed close pcp f/u, return if worse, new symptoms, fevers, etc.   Recheck pt comfortable.   Appears stable for d/c.         Suzi Roots, MD 04/04/12 1320  Suzi Roots, MD 04/04/12 [id number]

## 2012-04-04 NOTE — ED Notes (Addendum)
Pt c/o headache x 5 days. Pt has h/o headache and sts she had to take "seroquel" for relief last time. Pt also c/o nausea and intolerance to cold. Pt sts she wasn't able to get appt with PCP until later today so advised to come here.

## 2012-04-04 NOTE — Telephone Encounter (Signed)
Patient Information:  Caller Name: Mischell  Phone: [phone]  Patient: Sarah Salazar, Sarah Salazar  Gender: Female  DOB: 24-May-1955  Age: 55 Years  PCP: Marga Melnick  Pregnant: No   Symptoms  Reason For Call & Symptoms: Headache with neck stiffness  Reviewed Health History In EMR: No  Reviewed Medications In EMR: No  Reviewed Allergies In EMR: No  Reviewed Surgeries / Procedures: No  Date of Onset of Symptoms: 03/31/2012  Treatments Tried: Advil 800 mg  Treatments Tried Worked: No OB:  LMP: Unknown  Guideline(s) Used:  Headache  Disposition Per Guideline:   Go to ED Now  Reason For Disposition Reached:   Stiff neck (can't touch chin to chest)  Advice Given:  Apply Cold to the Area:   Apply a cold wet washcloth or cold pack to the forehead for 20 minutes.  Rest:   Lie down in a dark, quiet place and try to relax. Close your eyes and imagine your entire body relaxing.  Call Back If:  You become worse.  Office Follow Up:  Does the office need to follow up with this patient?: No  Instructions For The Office: N/A  RN Note:  Headache pain 7/10.  Unable to sleep at night waking up every hour due to pain and nausea.  Is having chills and is cold unable to check temp due to no Thermometer. Patient states that she is unable to put chin to chest due to stiffness and pain and states that it feels like something is ripping in her neck.  Patient does not have anyone with her to drive her to the emergency room.  Instructed that if she is unable to find someone to take her that she needs to call 911 and have transport take her in.  Cancelled 1545 appointment for office visit.

## 2012-04-05 ENCOUNTER — Ambulatory Visit (INDEPENDENT_AMBULATORY_CARE_PROVIDER_SITE_OTHER): Payer: 59 | Admitting: Internal Medicine

## 2012-04-05 ENCOUNTER — Encounter: Payer: Self-pay | Admitting: Internal Medicine

## 2012-04-05 ENCOUNTER — Ambulatory Visit: Payer: 59 | Admitting: Internal Medicine

## 2012-04-05 VITALS — BP 150/96 | HR 90 | Temp 98.3°F | Wt 174.8 lb

## 2012-04-05 DIAGNOSIS — I10 Essential (primary) hypertension: Secondary | ICD-10-CM

## 2012-04-05 DIAGNOSIS — R519 Headache, unspecified: Secondary | ICD-10-CM

## 2012-04-05 DIAGNOSIS — R51 Headache: Secondary | ICD-10-CM

## 2012-04-05 MED ORDER — DEXLANSOPRAZOLE 60 MG PO CPDR: 60.0000 mg | DELAYED_RELEASE_CAPSULE | Freq: Every day | ORAL | Status: DC

## 2012-04-05 MED ORDER — CYCLOBENZAPRINE HCL 5 MG PO TABS: ORAL_TABLET | ORAL | Status: DC

## 2012-04-05 MED ORDER — VERAPAMIL HCL 120 MG PO TABS: ORAL_TABLET | ORAL | Status: DC

## 2012-04-05 MED ORDER — TRAMADOL HCL 50 MG PO TABS: ORAL_TABLET | ORAL | Status: DC

## 2012-04-05 NOTE — Patient Instructions (Addendum)
Chronic daily headaches represent  a conversion of migraines into a headache which recurs repeatedly every day .These are almost always  due to the use of excess nonsteroidals such as ibuprofen or naproxen and or caffeine. The nonsteroidals and caffeine should be weaned as quickly as possible; but they should not be "cold [place]" going from a high dose to none. The triggers for reflux or "heart burn"  include stress; the "aspirin family" ; alcohol; peppermint; and caffeine (coffee, tea, cola, and chocolate). The aspirin family would include aspirin and the nonsteroidal agents such as ibuprofen &  Naproxen. Tylenol would not cause reflux. If having reflux ; food & drink should be avoided for @ least 2 hours before going to bed.  Blood Pressure Goal  Ideally is an AVERAGE < 135/85. This AVERAGE should be calculated from @ least 5-7 BP readings taken @ different times of day on different days of week. You should not respond to isolated BP readings , but rather the AVERAGE for that week  Use an anti-inflammatory cream such as Aspercreme or Zostrix cream twice a day to theneck as needed. In lieu of this warm moist compresses or  hot water bottle can be used. Do not apply ice.

## 2012-04-05 NOTE — Progress Notes (Signed)
Subjective:    Patient ID: Sarah Salazar, female    DOB: [birth year], 56 y.o.   MRN: [id number]  HPI Symptoms began 03/30/12 as nausea and chilling. The nausea progressed 11/29 and by that evening she was experiencing a headache in the occipital area w/o trigger or trauma. She had been taking 800 mg ibuprofen 2 a day as well as 200-400 mg of over-the-counter ibuprofen in between the larger dose administration.  She went to the ER12/3 because of the headache; she was given a narcotic. BP was 159/90 in ER. CT scan of the head was negative.  She has a similar history of headache in 2005; that headache was more severe and diagnosed as a migraine variant. She had an extensive evaluation which was negative.  She continues to have a constant aching headache at the occiput without radiation      Review of Systems   Photophobia/phonophobia: no Tearing of eyes:no  Fever: no  Neck pain/stiffness:no but decreased flexion due to strain on neck muscles Vision/speech/swallow/hearing difficulty: no  Focal weakness/numbness:no Altered mental status: no       She just denies significant dyspepsia despite having run out of Dexilant and taking increased amounts of nonsteroidals. She denies dysphagia, abdominal pain, melena, or rectal bleeding.     Objective:   Physical Exam Gen.:  well-nourished in appearance. Alert, appropriate and cooperative throughout exam. Head: Normocephalic without obvious abnormalities  Eyes: No corneal or conjunctival inflammation noted. FOV & Extraocular motion intact. Vision grossly normal. Ears: External  ear exam reveals no significant lesions or deformities. Canals clear .TMs normal. Hearing is slightly decreased normal bilaterally. Tuning fork exam is normal Nose: External nasal exam reveals no deformity or inflammation. Nasal mucosa are pink and moist. No lesions or exudates noted.  Mouth: Oral mucosa and oropharynx reveal no lesions or exudates. Teeth in good  repair. Neck: No deformities, masses, or tenderness noted. Range of motion decreased flexion; lateral rotation is normal.  Lungs: Normal respiratory effort; chest expands symmetrically. Lungs are clear to auscultation without rales, wheezes, or increased work of breathing. Heart: Normal rate and rhythm. Normal S1 and S2. No gallop, click, or rub. S4 w/o murmur.                                                                                   Musculoskeletal/extremities:  No clubbing, cyanosis, edema, or deformity noted. Range of motion  normal .Tone & strength  normal.Joints normal. Nail health  good. Vascular: Carotid & radial artery pulses are full and equal. No bruits present. Neurologic: Alert and oriented x3. Deep tendon reflexes symmetrical and normal. Romberg testing and finger to nose testing normal. No cranial nerve deficit present         Skin: Intact without suspicious lesions or rashes. Lymph: No cervical, axillary lymphadenopathy present. Psych: Mood and affect are normal. Normally interactive  Assessment & Plan:  #1 occipital headache; most likely diagnosis is chronic daily headache exacerbated by the  nonsteroidal use. No neuromuscular deficit present  #2 hypertension  #3 reflux; as yet no exacerbation of reflux despite being off Dexilant.  Plan: See orders and recommendations

## 2012-04-06 ENCOUNTER — Telehealth: Payer: Self-pay | Admitting: *Deleted

## 2012-04-06 ENCOUNTER — Emergency Department (HOSPITAL_COMMUNITY)
Admission: EM | Admit: 2012-04-06 | Discharge: 2012-04-06 | Disposition: A | Discharge status: Home / Self Care | Payer: 59

## 2012-04-06 NOTE — Telephone Encounter (Signed)
Left message to call office

## 2012-04-06 NOTE — Telephone Encounter (Signed)
Noted  

## 2012-04-06 NOTE — Telephone Encounter (Signed)
Spoke with patient, patient states she went to the ER "That was a joke, waited 20 minutes after telling them she felt as though she was having heart pounding and they did not appear concerned and I left" patient's B/P was 162/107  Per Dr.Hopper take 1 1/2 pills of verapamil every 12 hours and appointment tomorrow. Patient advise Emergency Room or urgent care if any change in symptoms, patient verbalized understanding as to why I was recommending ER or urgent care if change in symptoms and stated " I will probably take my chances of not going"  Dr.Hopper was in front of me at the time of conversation (verbally informed of patient's response)

## 2012-04-06 NOTE — Telephone Encounter (Signed)
Patient called office to tell us that she has been having a pounding feeling in her chest off and on since this morning. I advised that she needed to be evaluated in the ED to rule out cardiac related issues. Pt stated that she has had a pounding headache, elevated B/P (145/85) and can actually feel her heart beating in her chest at times. She has also been having pain in the upper back, neck  and shoulders since last week that will not go away. Denies diaphoresis, nausea or vomiting. Also denies arm or jaw pain. Instructed to go to ED to be evaluated, pt states she will have a friend drive her there now. SGJ, RN

## 2012-04-06 NOTE — Telephone Encounter (Signed)
With the headache and shoulder/neck symptoms; she will need a sedimentation rate to rule out polymyalgia rheumatica. The verapamil may need to be titrated to control both her blood pressure and headache.

## 2012-04-07 ENCOUNTER — Telehealth: Payer: Self-pay | Admitting: Internal Medicine

## 2012-04-07 ENCOUNTER — Encounter: Payer: Self-pay | Admitting: Internal Medicine

## 2012-04-07 ENCOUNTER — Ambulatory Visit (INDEPENDENT_AMBULATORY_CARE_PROVIDER_SITE_OTHER): Payer: 59 | Admitting: Internal Medicine

## 2012-04-07 VITALS — BP 148/100 | HR 93 | Temp 98.0°F | Resp 14 | Wt 174.8 lb

## 2012-04-07 DIAGNOSIS — R0609 Other forms of dyspnea: Secondary | ICD-10-CM

## 2012-04-07 DIAGNOSIS — R232 Flushing: Secondary | ICD-10-CM

## 2012-04-07 DIAGNOSIS — R0989 Other specified symptoms and signs involving the circulatory and respiratory systems: Secondary | ICD-10-CM

## 2012-04-07 DIAGNOSIS — R51 Headache: Secondary | ICD-10-CM

## 2012-04-07 DIAGNOSIS — R000 Tachycardia, unspecified: Secondary | ICD-10-CM

## 2012-04-07 DIAGNOSIS — I10 Essential (primary) hypertension: Secondary | ICD-10-CM

## 2012-04-07 DIAGNOSIS — R0600 Dyspnea, unspecified: Secondary | ICD-10-CM

## 2012-04-07 LAB — TROPONIN I: Troponin I: <0.01 ng/mL (ref <0.06)

## 2012-04-07 LAB — CK TOTAL AND CKMB (NOT AT ARMC)
CK, MB: 1.2 ng/mL (ref 0.3–4.0)
Total CK: 88 U/L (ref 7–177)

## 2012-04-07 LAB — D-DIMER, QUANTITATIVE: D-Dimer, Quant: 0.29 ug/mL-FEU (ref 0.00–0.48)

## 2012-04-07 NOTE — Patient Instructions (Addendum)
Use a cervical memory foam pillow to prevent hyperextension or hyperflexion of the cervical spine. Use an anti-inflammatory cream such as Aspercreme or Zostrix cream twice a day to the back as needed. In lieu of this warm moist compresses or  hot water bottle can be used. Do not apply ice .  Blood Pressure Goal  Ideally is an AVERAGE < 135/85. This AVERAGE should be calculated from @ least 5-7 BP readings taken @ different times of day on different days of week. You should not respond to isolated BP readings , but rather the AVERAGE for that week  Review and correct the record as indicated. Please share record with all medical staff seen.

## 2012-04-07 NOTE — Progress Notes (Signed)
Subjective:    Patient ID: Sarah Salazar, female    DOB: [birth year], 56 y.o.   MRN: [id number]  HPI  The telephone communications of 12/ 5/13 were reviewed; she was referred to the emergency room for pounding in her chest w/o pain and presumed back, neck, and occipital pain. She left without being seen after a prolonged wait in the emergency room.  She  continued to monitor her blood pressures after increasing verapamil 120 mg every 12 hours to 180 mg every 12 hours as of last night. She's taken 2 doses of this increased amount.  She continues to have the pounding over the upper chest and occipital headache as well as right scapular discomfort present intermittently for 1-2 years. She denies neck or shoulder pain.  Blood pressures have ranged from 129-162/84-107. Following the increased dose of verapamil there was a dramatic decrease in her blood pressure from 149/102-121/91 her pulse went from 76-105.    Review of Systems  The headache is occipital in location; she has intermittent flushing as well. She denies diarrhea and chest pain as noted above.  She describes intermittent dyspnea at rest; it is not significant at this time. She has intermittent edema. She denies paroxysmal nocturnal dyspnea.     Objective:   Physical Exam Gen.: Appears slightly uncomfortable; well-nourished in appearance. Alert, appropriate and cooperative throughout exam. Head: Normocephalic without obvious abnormalities Eyes: No corneal or conjunctival inflammation noted. Pupils equal round reactive to light and accommodation. FOV & Extraocular motion intact. Vision grossly normal. Nose: External nasal exam reveals no deformity or inflammation. Nasal mucosa are pink and moist. No lesions or exudates noted.  Mouth: Oral mucosa and oropharynx reveal no lesions or exudates. Teeth in good repair. No tongue deviation Neck: No deformities, masses, or tenderness noted. Significant decrease in neck flexion; lateral  rotation is essentially normal bilaterally Lungs: Normal respiratory effort; chest expands symmetrically. Lungs are clear to auscultation without rales, wheezes, or increased work of breathing. Heart: Normal rate and rhythm. Normal S1 and S2. No gallop, click, or rub. No murmur.                                                                                Musculoskeletal/extremities: No deformity or scoliosis noted of  the thoracic or lumbar spine. No clubbing, cyanosis, edema, or deformity noted. Range of motion  normal .Tone & strength  normal.Joints normal. Nail health  good. There is localized tenderness in the right scapular area aggravated by range of motion of the right shoulder. Vascular: Carotid, radial artery pulses are full and equal. No bruits present. Homans sign negative bilaterally Neurologic: Alert and oriented x3. Deep tendon reflexes symmetrical and normal.          Skin: No rash present in the right scapular area Lymph: No cervical, axillary lymphadenopathy present. Psych: factual historian. Normally interactive  Assessment & Plan:  #1 occipital headache without neck or shoulder girdle pain to suggest polymyalgia rheumatica. No cranial nerve deficit present.  #2 "pounding" in the chest without pain. EKG is normal. The team voltage tends to be low in aVL. No ischemic changes are present. EKG is stable compared to 01/05/11.  #3 flushing without associated diarrhea or chest pain

## 2012-04-07 NOTE — Telephone Encounter (Signed)
Done

## 2012-04-07 NOTE — Addendum Note (Signed)
Addended by: Silvio Pate D on: 04/07/2012 02:22 PM   Modules accepted: Orders

## 2012-04-07 NOTE — Telephone Encounter (Signed)
Message copied by Verner Chol on Fri Apr 07, 2012  7:58 AM ------      Message from: Maurice Small      Created: Thu Apr 06, 2012  6:10 PM       PLEASE ADD patient in Dr.Hopper's 1 pm slot.

## 2012-04-10 ENCOUNTER — Telehealth: Payer: Self-pay | Admitting: Internal Medicine

## 2012-04-10 NOTE — Telephone Encounter (Signed)
It is critical to continue the blood pressure medicine without interruption at the increased dose as prescribed at last visit. Please complete the 24-hour urine for stress hormone studies. The cardiac studies are negative to date.

## 2012-04-10 NOTE — Telephone Encounter (Signed)
Opened in error

## 2012-04-10 NOTE — Telephone Encounter (Signed)
Pt indicated that her daughter was Dx and had surgery because of SVT ventricular tachycardia. BP 12-6-- 157/101 pulse 95,12-7-- 141/85.8 pulse 77.65, 12-8-- 175/87.5 pulse 76.5, BP today 150/100  Pt still c/o slight headache and hand swollen and tingling. Pt denies any SOB, and chest Pain.

## 2012-04-10 NOTE — Telephone Encounter (Signed)
Per DPR note ok to leave detailed message on voicemail for patient. I called patient and left message with Dr.Hopper's additional recommendation. I informed patient in message that lab results will be sent through MyChart for status indicates she is active, patient will receive call only if indicated by MD, otherwise she can review results on MyChart.

## 2012-04-10 NOTE — Telephone Encounter (Signed)
Caller: Tammera/Patient; Phone: [phone]; Reason for Call: Called to ask if she needs to be seen when brings in her 24 hour urine?  Reported BP elevated for 1 week, slight headache and hand swelling with tingling per CNA note 04/10/12.  Wants to discuss (daughters) heart condition.  Epic documents a previous CNA note that has been routed to Dr Alwyn Ren.  Declined triage of symptoms.  Prefers to discuss at office when she brings in 24 hour urine.

## 2012-04-10 NOTE — Telephone Encounter (Signed)
Pt came to office after speaking with several people over the phone stating her B/P is still elevated and still has HA. Pt stated that her pharmacy ran out of her medication and she had not picked it up yet. Advised pt to stop at pharmacy on the way home, take medication Dr. Alwyn Ren prescribed and to try and lay down to rest. Spoke to pt about importance of compliance of medication regime for overall well being. I also advised that Chrae would contact her with test results and discuss f/u recommendations.

## 2012-04-10 NOTE — Telephone Encounter (Signed)
Patient states she would like to discuss with Dr. Alwyn Ren possible family history of cardiac issues that she forgot to mention to him. She also states Dr. Alwyn Ren increased the dosage of one of her meds and she would like to know if she is to continue taking it.

## 2012-04-12 ENCOUNTER — Other Ambulatory Visit: Payer: Self-pay | Admitting: Internal Medicine

## 2012-04-12 ENCOUNTER — Telehealth: Payer: Self-pay

## 2012-04-12 DIAGNOSIS — R000 Tachycardia, unspecified: Secondary | ICD-10-CM

## 2012-04-12 DIAGNOSIS — I10 Essential (primary) hypertension: Secondary | ICD-10-CM

## 2012-04-12 MED ORDER — LOSARTAN POTASSIUM 100 MG PO TABS: 100.0000 mg | ORAL_TABLET | Freq: Every day | ORAL | Status: DC

## 2012-04-12 MED ORDER — METOPROLOL TARTRATE 25 MG PO TABS: 25.0000 mg | ORAL_TABLET | Freq: Two times a day (BID) | ORAL | Status: DC

## 2012-04-12 NOTE — Telephone Encounter (Signed)
Patient called with several concerns:  1.) Patient thinks Verapamil is causing heart pounding, patient states about 30-45 minutes after she takes verapamil she notices heart pounding. I informed patient I would speak with Dr.Hopper and get his recommendations about this concern  2.) Patient questions if we have lab work from [facility], patient states she spoke with the Clinical Lead person the other day and left number for [place] to get records. I informed patient I will follow-up with Dr.Hopper to see if he received labs and if not I will call and request. Number from patient [phone]  Patient proceeded with other concerns and I informed patient that unfortunately I would have to call her back ( on the phone with patient for 10 min)

## 2012-04-12 NOTE — Telephone Encounter (Signed)
Spoke with patient, per Dr.Hopper Stop Verapamil  (although it is unlikely causing symptoms), change to Generic Lopressor 25 mg BID #60 and Losartan 100 mg daily #30 and continue to monitor B/P, if levels remain elevated OV due. Patient's reading from yesterday was 165/77, headaches were a little calmer.   Patient then went on to mention that her daughter was treated for SVT and she wonders if this is hereditary, Hopp please advise

## 2012-04-13 ENCOUNTER — Telehealth: Payer: Self-pay

## 2012-04-13 NOTE — Telephone Encounter (Signed)
Lab work from 04/07/12 was received. I called [facility] and requested labs from May to be faxed to ([facility]) [phone]

## 2012-04-13 NOTE — Telephone Encounter (Signed)
All labs received!

## 2012-04-13 NOTE — Telephone Encounter (Signed)
Spoke with patient, patient aware of recent lab values (copy mailed to patient, copy sent for scanning-labs came from [facility])Needs work note for 04/05/2012 and 04/07/2012, please advise   Fax work note to [phone]

## 2012-04-14 NOTE — Telephone Encounter (Signed)
Note faxed to provided fax number.

## 2012-04-15 LAB — CATECHOLAMINES, FRACTIONATED, URINE, 24 HOUR
Calculated Total (E+NE): 72 mcg/24 h (ref 26–121)
Creatinine, Urine mg/day-CATEUR: 1.31 g/(24.h) (ref 0.63–2.50)
Dopamine, 24 hr Urine: 252 mcg/24 h (ref 52–480)
Epinephrine, 24 hr Urine: 4 mcg/24 h (ref 2–24)
Norepinephrine, 24 hr Ur: 68 mcg/24 h (ref 15–100)
Total Volume - CF 24Hr U: 1500 mL

## 2012-04-15 LAB — METANEPHRINES, URINE, 24 HOUR
Metaneph Total, Ur: 761 mcg/24 h (ref 224–832)
Metanephrines, Ur: 176 mcg/24 h (ref 90–315)
Normetanephrine, 24H Ur: 585 mcg/24 h (ref 122–676)

## 2012-04-16 ENCOUNTER — Telehealth: Payer: Self-pay | Admitting: Internal Medicine

## 2012-04-17 ENCOUNTER — Encounter: Payer: Self-pay | Admitting: Internal Medicine

## 2012-04-17 ENCOUNTER — Telehealth: Payer: Self-pay | Admitting: Internal Medicine

## 2012-04-17 ENCOUNTER — Ambulatory Visit (HOSPITAL_BASED_OUTPATIENT_CLINIC_OR_DEPARTMENT_OTHER)
Admission: RE | Admit: 2012-04-17 | Discharge: 2012-04-17 | Disposition: A | Discharge status: Home / Self Care | Payer: 59 | Source: Ambulatory Visit | Attending: Internal Medicine | Admitting: Internal Medicine

## 2012-04-17 ENCOUNTER — Ambulatory Visit (INDEPENDENT_AMBULATORY_CARE_PROVIDER_SITE_OTHER): Payer: 59 | Admitting: Internal Medicine

## 2012-04-17 VITALS — BP 134/96 | HR 80 | Temp 98.0°F | Wt 175.0 lb

## 2012-04-17 DIAGNOSIS — I10 Essential (primary) hypertension: Secondary | ICD-10-CM

## 2012-04-17 DIAGNOSIS — M25519 Pain in unspecified shoulder: Secondary | ICD-10-CM | POA: Insufficient documentation

## 2012-04-17 DIAGNOSIS — R000 Tachycardia, unspecified: Secondary | ICD-10-CM | POA: Insufficient documentation

## 2012-04-17 DIAGNOSIS — R0781 Pleurodynia: Secondary | ICD-10-CM

## 2012-04-17 DIAGNOSIS — R079 Chest pain, unspecified: Secondary | ICD-10-CM | POA: Insufficient documentation

## 2012-04-17 DIAGNOSIS — R071 Chest pain on breathing: Secondary | ICD-10-CM

## 2012-04-17 MED ORDER — METOPROLOL TARTRATE 25 MG PO TABS: ORAL_TABLET | ORAL | Status: DC

## 2012-04-17 NOTE — Patient Instructions (Addendum)
Blood Pressure Goal = AVERAGE < 140/90; Ideally AVERAGE < 135/85. This AVERAGE should be calculated from @ least 5-7 BP readings taken @ different times of day on different days of week. You should not respond to isolated BP readings , but rather the AVERAGE for that week Order for x-rays entered into  the computer; these will be performed at [facility]. No appointment is necessary.  Please see Dr Tomasa Rand as soon as possible

## 2012-04-17 NOTE — Progress Notes (Signed)
  Subjective:    Patient ID: Sarah Salazar, female    DOB: 1955-09-15, 56 y.o.   MRN: [id number]  HPI  Since metoprolol 25 mg twice a day and losartan 100 mg were initiated her blood pressures have ranged from 105/74-172/102. On verapamil her blood pressures were as low as 105/66; but she felt that the verapamil caused pounding in her chest.  She questions whether the new regimen is affecting her sleep/rest.  Metoprolol was initiated because of her concerns with tachycardia and her daughters history of supraventricular tachycardia for which ablation was performed    Review of Systems She denies chest pain, palpitations, dyspnea, or ankle edema. She has had some edema of the hands. She's had some discomfort in the right scapula area intermittently with inspiration if BP is elevated. Her d-dimer was normal; her last chest x-ray was in May.       Objective:   Physical Exam She appears healthy and well-nourished;s he is in no acute distress  No carotid bruits are present.  Heart rhythm and rate are normal with no significant murmurs or gallops. S4  Chest is clear with no increased work of breathing  There is no evidence of aortic aneurysm or renal artery bruits  Shee has no clubbing or edema.   Pedal pulses are intact   No ischemic skin changes are present   Affect very flat        Assessment & Plan:  #1 hypertension; it'll be critical to verify the blood pressure average  #2 right scapular pain with normal d-dimer. Chest x-ray will be performed to evaluate this as well as cardiac size  #3 subjective tachycardia with family history supraventricular tachycardia. Cardiology opinion will be sought as this is a causing a great deal of concern for her.

## 2012-04-17 NOTE — Telephone Encounter (Signed)
[facility] Triage Call Report Triage Record Num: [id number] Operator: Jacquenette Shone Patient Name: Sarah Salazar Call Date & Time: 04/16/2012 4:17:54PM Patient Phone: [phone] PCP: Marga Melnick Patient Gender: Female PCP Fax : [phone] Patient DOB: 20-Nov-1955 Practice Name: [facility] Reason for Call: Caller: [name]/Patient; PCP: Marga Melnick; CB#: [phone]; Call regarding BP concerns:; most recent BP pressure 139/89 Pulse 78 at 3:24pm after taking Zanax and two new BP meds prescribed this week by Dr. Alwyn Ren. Prior BP 's today reported at 165/102. All emergent symptoms ruled out per Hypertension , Diagnosed or Suspected guideline with exception " a sudden change in blood pressure and has not been taking blood pressure medicaiton as prescribed or recently started , changed or stopped taking prescription medication ; or recently started taking nonprescription or alternative medication". Advised caller to call clinic in am ( 12/15) and sched. an am appt. Discussed with patient she should be seen in ED for any worsening sxs. Protocol(s) Used: Hypertension, Diagnosed or Suspected Recommended Outcome per Protocol: See Provider within 24 hours Reason for Outcome: A sudden elevation in blood pressure AND has not been taking blood pressure medication as prescribed, or recently started, changed, or stopped taking prescription medication; or recently started taking nonprescription or alternative medicine Care Advice: ~ Call provider if systolic BP is 180 or greater, or if diastolic BP is 120 or greater. ~ HEALTH PROMOTION / MAINTENANCE Medication Advice: - Discontinue all nonprescription and alternative medications, especially stimulants, until evaluated by provider. - Take prescribed medications as directed, following label instructions for the medication. - Do not change medications or dosing regimen until provider is consulted. - Know possible side effects of  medication and what to do if they occur. - Tell provider all prescription, nonprescription or alternative medications that you take ~

## 2012-04-19 ENCOUNTER — Telehealth: Payer: Self-pay | Admitting: Internal Medicine

## 2012-04-19 NOTE — Telephone Encounter (Signed)
Patient received results of xray, patient stated that she was questioning : Stable right upper quadrant surgical  Clips means on Report. Patient googled and found out that is from her Gallbladder surgery.   Patient has pending appointment to see Dr.Cunningham tomorrow

## 2012-04-19 NOTE — Telephone Encounter (Signed)
has some questions regarding her xray results  Cb# [phone]

## 2012-04-23 ENCOUNTER — Encounter (HOSPITAL_BASED_OUTPATIENT_CLINIC_OR_DEPARTMENT_OTHER): Payer: Self-pay | Admitting: *Deleted

## 2012-04-23 ENCOUNTER — Emergency Department (HOSPITAL_BASED_OUTPATIENT_CLINIC_OR_DEPARTMENT_OTHER)
Admission: EM | Admit: 2012-04-23 | Discharge: 2012-04-24 | Disposition: A | Discharge status: Home / Self Care | Payer: 59 | Attending: Emergency Medicine | Admitting: Emergency Medicine

## 2012-04-23 ENCOUNTER — Emergency Department (HOSPITAL_BASED_OUTPATIENT_CLINIC_OR_DEPARTMENT_OTHER): Payer: 59

## 2012-04-23 DIAGNOSIS — K5900 Constipation, unspecified: Secondary | ICD-10-CM | POA: Insufficient documentation

## 2012-04-23 DIAGNOSIS — R091 Pleurisy: Secondary | ICD-10-CM

## 2012-04-23 DIAGNOSIS — F3289 Other specified depressive episodes: Secondary | ICD-10-CM | POA: Insufficient documentation

## 2012-04-23 DIAGNOSIS — E876 Hypokalemia: Secondary | ICD-10-CM

## 2012-04-23 DIAGNOSIS — E039 Hypothyroidism, unspecified: Secondary | ICD-10-CM | POA: Insufficient documentation

## 2012-04-23 DIAGNOSIS — Z79899 Other long term (current) drug therapy: Secondary | ICD-10-CM | POA: Insufficient documentation

## 2012-04-23 DIAGNOSIS — Z8719 Personal history of other diseases of the digestive system: Secondary | ICD-10-CM | POA: Insufficient documentation

## 2012-04-23 DIAGNOSIS — F411 Generalized anxiety disorder: Secondary | ICD-10-CM | POA: Insufficient documentation

## 2012-04-23 DIAGNOSIS — F329 Major depressive disorder, single episode, unspecified: Secondary | ICD-10-CM | POA: Insufficient documentation

## 2012-04-23 DIAGNOSIS — R51 Headache: Secondary | ICD-10-CM | POA: Insufficient documentation

## 2012-04-23 LAB — URINALYSIS, ROUTINE W REFLEX MICROSCOPIC
Bilirubin Urine: NEGATIVE
Glucose, UA: NEGATIVE mg/dL
Hgb urine dipstick: NEGATIVE
Ketones, ur: NEGATIVE mg/dL
Leukocytes, UA: NEGATIVE
Nitrite: NEGATIVE
Protein, ur: NEGATIVE mg/dL
Specific Gravity, Urine: 1.003 — ABNORMAL LOW (ref 1.005–1.030)
Urobilinogen, UA: 0.2 mg/dL (ref 0.0–1.0)
pH: 6 (ref 5.0–8.0)

## 2012-04-23 LAB — CBC WITH DIFFERENTIAL/PLATELET
Basophils Absolute: 0 10*3/uL (ref 0.0–0.1)
Basophils Relative: 0 % (ref 0–1)
Eosinophils Absolute: 0.1 10*3/uL (ref 0.0–0.7)
Eosinophils Relative: 2 % (ref 0–5)
HCT: 41.3 % (ref 36.0–46.0)
Hemoglobin: 14.4 g/dL (ref 12.0–15.0)
Lymphocytes Relative: 31 % (ref 12–46)
Lymphs Abs: 2.5 10*3/uL (ref 0.7–4.0)
MCH: 31.7 pg (ref 26.0–34.0)
MCHC: 34.9 g/dL (ref 30.0–36.0)
MCV: 91 fL (ref 78.0–100.0)
Monocytes Absolute: 0.7 10*3/uL (ref 0.1–1.0)
Monocytes Relative: 9 % (ref 3–12)
Neutro Abs: 4.6 10*3/uL (ref 1.7–7.7)
Neutrophils Relative %: 58 % (ref 43–77)
Platelets: 239 10*3/uL (ref 150–400)
RBC: 4.54 MIL/uL (ref 3.87–5.11)
RDW: 13.1 % (ref 11.5–15.5)
WBC: 7.9 10*3/uL (ref 4.0–10.5)

## 2012-04-23 LAB — D-DIMER, QUANTITATIVE: D-Dimer, Quant: 0.46 ug/mL-FEU (ref 0.00–0.48)

## 2012-04-23 NOTE — ED Provider Notes (Addendum)
History  This chart was scribed for Sakshi Sermons Smitty Cords, MD by Shari Heritage, ED Scribe. The patient was seen in room MH04/MH04. Patient's care was started at 2321.   CSN: [id number]  Arrival date & time 04/23/12  2307   First MD Initiated Contact with Patient 04/23/12 2321      Chief Complaint  Patient presents with  . Chest Pain     Patient is a 56 y.o. female presenting with chest pain. The history is provided by the patient. No language interpreter was used.  Chest Pain The chest pain began yesterday. Chest pain occurs intermittently. The chest pain is unchanged. The pain is associated with breathing. The severity of the pain is moderate. The quality of the pain is described as aching and sharp. The pain does not radiate. Chest pain is worsened by stress and deep breathing. Primary symptoms include palpitations. Pertinent negatives for primary symptoms include no fever, no shortness of breath, no cough, no nausea and no vomiting.  The palpitations did not occur with shortness of breath.   Pertinent negatives for associated symptoms include no diaphoresis. Treatments tried: xanax. Risk factors include stress.  Pertinent negatives for past medical history include no MI.  Pertinent negatives for family medical history include: no Marfan's syndrome in family.  Procedure history is negative for cardiac catheterization.     HPI Comments: Sarah Salazar is a 56 y.o. female who presents to the Emergency Department complaining of intermittent episodes of moderate, non-radiating, midsternal chest pain onset yesterday. Pain is sometimes achy and sometimes sharp and is pleuritic in nature. Patient says that the chest began last night and lasted until 4 pm today after she took a Xanax. Pain returned 4 hours ago prompting her to come to the ED. Patient says that CP is worse with inspiration. No dyspnea with exertion. Pain is usually associated with a stressful situation or during BP measurements.  Patient reports associated symptoms of palpitations and headache. There is no diaphoresis, SOB, cough, fever, nausea, vomiting or rash. Patient reports some constipation and 1 episode of loose stool. She denies any recent travel. Patient says that she has been having peristent headaches and high blood pressure since Thanksgiving. Patient has been worked by her PCP for ongoing episodes of chest discomfort, headache and elevated BP. Patient says that her current BP meds are "not helping." Patient's last visit to her PCP was last Monday. He has referred her to a cardiologist and patient has an appointment on 05/12/2012. Patient was seen for pleuritic chest pain on 12/6, 12/11 and 12/16. Patient is also being followed up for persistent headaches. Patient has also been referred to psychiatry for ongoing evaluation. Patient's other medical history includes anxiety, depression and hypothyroidism.    Past Medical History  Diagnosis Date  . IBS (irritable bowel syndrome)   . Anxiety   . Depression   . Thyroid disease     hypothyroidism  . Migraine     Past Surgical History  Procedure Date  . Cholecystectomy   . Dilation and curettage of uterus     x3  . Knee surgery     x2  . Wrist surgery     x3  . Laparoscopy 06/03/1990    to evaluate dysfunctional menses & pelvic pain  . Breast lumpectomy     left  . Cystocele repair 01/13/09  . Upper gastrointestinal endoscopy 2002  . Colonoscopy 2002  . Mastectomy, partial     benign fibrocystic changes    Family  History  Problem Relation Age of Onset  . Coronary artery disease Mother   . Stroke Mother     TIA  . Uterine cancer Mother     BREAST  . Lung cancer Father     LUNG  . Heart disease Father   . Colon cancer Paternal Uncle     COLON; STOMACH  . COPD Father   . Thyroid disease Mother     goiter  . Lung cancer Maternal Grandfather   . Coronary artery disease Father   . Rheum arthritis Maternal Aunt   . Supraventricular tachycardia  Daughter     S/P ablation    History  Substance Use Topics  . Smoking status: Never Smoker   . Smokeless tobacco: Never Used  . Alcohol Use: Yes     Comment: occ    OB History    Grav Para Term Preterm Abortions TAB SAB Ect Mult Living                  Review of Systems  Constitutional: Negative for fever and diaphoresis.  HENT: Negative for neck pain and neck stiffness.   Respiratory: Negative for cough and shortness of breath.   Cardiovascular: Positive for chest pain and palpitations.  Gastrointestinal: Positive for constipation. Negative for nausea and vomiting.  Skin: Negative for rash.  Neurological: Positive for headaches.  All other systems reviewed and are negative.    Allergies  Amoxicillin; Benzonatate; Fentanyl; Midazolam; Oxycodone-aspirin; Sulfonamide derivatives; Hydrocodone; Nalbuphine; Oxycodone-acetaminophen; Venlafaxine; and Verapamil  Home Medications   Current Outpatient Rx  Name  Route  Sig  Dispense  Refill  . ALPRAZOLAM 0.5 MG PO TABS   Oral   Take 0.25-0.5 mg by mouth 2 (two) times daily as needed. For anxiety         . BUPROPION HCL 100 MG PO TABS   Oral   Take 300 mg by mouth daily.          . CYCLOBENZAPRINE HCL 5 MG PO TABS      1-2 qhs prn   20 tablet   0   . DEXLANSOPRAZOLE 60 MG PO CPDR   Oral   Take 1 capsule (60 mg total) by mouth daily.   30 capsule   5   . HYOSCYAMINE SULFATE 0.125 MG SL SUBL   Sublingual   Place 0.125 mg under the tongue every 4 (four) hours as needed.         . IBUPROFEN 800 MG PO TABS   Oral   Take 800 mg by mouth every 8 (eight) hours as needed. For pain          . LAMOTRIGINE 150 MG PO TABS   Oral   Take 300 mg by mouth at bedtime. For bi polar disorder         . LEVOTHYROXINE SODIUM 100 MCG PO TABS   Oral   Take 1 tablet (100 mcg total) by mouth daily.   90 tablet   0   . LOSARTAN POTASSIUM 100 MG PO TABS   Oral   Take 1 tablet (100 mg total) by mouth daily.   30  tablet   0   . METOPROLOL TARTRATE 25 MG PO TABS      1& 1/2 bid if BP averages > 140/90   60 tablet   0   . OMEPRAZOLE 40 MG PO CPDR   Oral   Take 1 capsule (40 mg total) by mouth daily.   30 capsule  3   . POTASSIUM CHLORIDE CRYS ER 20 MEQ PO TBCR   Oral   Take by mouth daily. 2 tablets once daily EXCEPT 3 tablets on  Tuesday, Thursday, and Sunday         . TRAMADOL HCL 50 MG PO TABS      1 by mouth every 6 hours as needed for cough   30 tablet   1     She had taken the tramadol without any adverse eff ...     Triage Vitals: BP 164/85  Pulse 118  Temp 98.4 F (36.9 C) (Oral)  Resp 20  SpO2 100%  Physical Exam  Constitutional: She is oriented to person, place, and time. She appears well-developed and well-nourished.  HENT:  Head: Normocephalic and atraumatic.  Mouth/Throat: Oropharynx is clear and moist. Mucous membranes are not dry.  Eyes: EOM are normal. Pupils are equal, round, and reactive to light.  Neck: Normal range of motion. Neck supple. No JVD present.  Cardiovascular: Normal rate, regular rhythm and intact distal pulses.   No murmur heard. Pulmonary/Chest: Effort normal and breath sounds normal. No respiratory distress. She has no wheezes. She has no rales.  Abdominal: Soft. Bowel sounds are normal. She exhibits no distension. There is no tenderness. There is no rebound and no guarding.  Musculoskeletal: Normal range of motion. She exhibits no edema and no tenderness.       Strength is 5/5 in all extremities.  Neurological: She is alert and oriented to person, place, and time. She has normal reflexes.  Skin: Skin is warm and dry. No rash noted. No erythema.  Psychiatric: Her mood appears anxious.    ED Course  Procedures (including critical care time) DIAGNOSTIC STUDIES: Oxygen Saturation is 100% on room air, normal by my interpretation.    COORDINATION OF CARE: 11:52 PM- Patient informed of current plan for treatment and evaluation and  agrees with plan at this time.   Results for orders placed during the hospital encounter of 04/23/12  CBC WITH DIFFERENTIAL      Component Value Range   WBC 7.9  4.0 - 10.5 K/uL   RBC 4.54  3.87 - 5.11 MIL/uL   Hemoglobin 14.4  12.0 - 15.0 g/dL   HCT 32.4  40.1 - 02.7 %   MCV 91.0  78.0 - 100.0 fL   MCH 31.7  26.0 - 34.0 pg   MCHC 34.9  30.0 - 36.0 g/dL   RDW 25.3  66.4 - 40.3 %   Platelets 239  150 - 400 K/uL   Neutrophils Relative 58  43 - 77 %   Neutro Abs 4.6  1.7 - 7.7 K/uL   Lymphocytes Relative 31  12 - 46 %   Lymphs Abs 2.5  0.7 - 4.0 K/uL   Monocytes Relative 9  3 - 12 %   Monocytes Absolute 0.7  0.1 - 1.0 K/uL   Eosinophils Relative 2  0 - 5 %   Eosinophils Absolute 0.1  0.0 - 0.7 K/uL   Basophils Relative 0  0 - 1 %   Basophils Absolute 0.0  0.0 - 0.1 K/uL  BASIC METABOLIC PANEL      Component Value Range   Sodium 141  135 - 145 mEq/L   Potassium 2.7 (*) 3.5 - 5.1 mEq/L   Chloride 103  96 - 112 mEq/L   CO2 24  19 - 32 mEq/L   Glucose, Bld 79  70 - 99 mg/dL   BUN 13  6 - 23 mg/dL   Creatinine, Ser 1.61  0.50 - 1.10 mg/dL   Calcium 09.6  8.4 - 04.5 mg/dL   GFR calc non Af Amer 63 (*) >90 mL/min   GFR calc Af Amer 73 (*) >90 mL/min  TROPONIN I      Component Value Range   Troponin I <0.30  <0.30 ng/mL  D-DIMER, QUANTITATIVE      Component Value Range   D-Dimer, Quant 0.46  0.00 - 0.48 ug/mL-FEU  URINALYSIS, ROUTINE W REFLEX MICROSCOPIC      Component Value Range   Color, Urine YELLOW  YELLOW   APPearance CLEAR  CLEAR   Specific Gravity, Urine 1.003 (*) 1.005 - 1.030   pH 6.0  5.0 - 8.0   Glucose, UA NEGATIVE  NEGATIVE mg/dL   Hgb urine dipstick NEGATIVE  NEGATIVE   Bilirubin Urine NEGATIVE  NEGATIVE   Ketones, ur NEGATIVE  NEGATIVE mg/dL   Protein, ur NEGATIVE  NEGATIVE mg/dL   Urobilinogen, UA 0.2  0.0 - 1.0 mg/dL   Nitrite NEGATIVE  NEGATIVE   Leukocytes, UA NEGATIVE  NEGATIVE    Dg Chest 2 View  04/23/2012  *RADIOLOGY REPORT*  Clinical Data:  Chest pain.  CHEST - 2 VIEW  Comparison: PA and lateral chest 09/01/2011 and 04/17/2012.  Findings: Lungs are clear.  Heart size is normal.  No pneumothorax or pleural fluid.  IMPRESSION: Negative chest.   Original Report Authenticated By: Holley Dexter, M.D.      No diagnosis found.   Date: 04/24/2012  Rate: 114  Rhythm: sinus tachycardia  QRS Axis: normal  Intervals: normal  ST/T Wave abnormalities: normal  Conduction Disutrbances:none  Narrative Interpretation:   Old EKG Reviewed: none available    MDM  Pain is pleuritic in nature and is relieved by xanax.  Suspect pleurisy with a anxietal component.  Doubt cardiac etiology and in the setting of a negative EKG and 2 negative troponins ACS is excluded.  Follow up with your PMD for ongoing return for ongoing chest pain neck pain shortness of breath dyspnea on exertion.  Patient verbalizes understanding and agrees to follow up      I personally performed the services described in this documentation, which was scribed in my presence. The recorded information has been reviewed and is accurate.     Jasmine Awe, MD 04/24/12 0246  Xan Ingraham Smitty Cords, MD 04/24/12 [phone]

## 2012-04-23 NOTE — ED Notes (Signed)
Pt states she has had a H/A and problems with her BP since the Monday after Thanksgiving. Has been on BP meds x 3, but "not helping". Today "feels like I've been running a marathon. Heart is racing"

## 2012-04-24 ENCOUNTER — Telehealth: Payer: Self-pay | Admitting: Internal Medicine

## 2012-04-24 ENCOUNTER — Telehealth: Payer: Self-pay

## 2012-04-24 ENCOUNTER — Emergency Department (HOSPITAL_BASED_OUTPATIENT_CLINIC_OR_DEPARTMENT_OTHER)
Admission: EM | Admit: 2012-04-24 | Discharge: 2012-04-24 | Disposition: A | Discharge status: Home / Self Care | Payer: 59 | Attending: Emergency Medicine | Admitting: Emergency Medicine

## 2012-04-24 ENCOUNTER — Emergency Department (HOSPITAL_BASED_OUTPATIENT_CLINIC_OR_DEPARTMENT_OTHER): Payer: 59

## 2012-04-24 ENCOUNTER — Encounter (HOSPITAL_BASED_OUTPATIENT_CLINIC_OR_DEPARTMENT_OTHER): Payer: Self-pay | Admitting: *Deleted

## 2012-04-24 DIAGNOSIS — H04129 Dry eye syndrome of unspecified lacrimal gland: Secondary | ICD-10-CM | POA: Insufficient documentation

## 2012-04-24 DIAGNOSIS — Z9889 Other specified postprocedural states: Secondary | ICD-10-CM | POA: Insufficient documentation

## 2012-04-24 DIAGNOSIS — Z79899 Other long term (current) drug therapy: Secondary | ICD-10-CM | POA: Insufficient documentation

## 2012-04-24 DIAGNOSIS — F411 Generalized anxiety disorder: Secondary | ICD-10-CM | POA: Insufficient documentation

## 2012-04-24 DIAGNOSIS — F3289 Other specified depressive episodes: Secondary | ICD-10-CM | POA: Insufficient documentation

## 2012-04-24 DIAGNOSIS — K589 Irritable bowel syndrome without diarrhea: Secondary | ICD-10-CM | POA: Insufficient documentation

## 2012-04-24 DIAGNOSIS — E079 Disorder of thyroid, unspecified: Secondary | ICD-10-CM | POA: Insufficient documentation

## 2012-04-24 DIAGNOSIS — G43909 Migraine, unspecified, not intractable, without status migrainosus: Secondary | ICD-10-CM | POA: Insufficient documentation

## 2012-04-24 DIAGNOSIS — I10 Essential (primary) hypertension: Secondary | ICD-10-CM

## 2012-04-24 DIAGNOSIS — R350 Frequency of micturition: Secondary | ICD-10-CM | POA: Insufficient documentation

## 2012-04-24 DIAGNOSIS — F329 Major depressive disorder, single episode, unspecified: Secondary | ICD-10-CM | POA: Insufficient documentation

## 2012-04-24 LAB — BASIC METABOLIC PANEL
BUN: 13 mg/dL (ref 6–23)
BUN: 8 mg/dL (ref 6–23)
CO2: 24 mEq/L (ref 19–32)
CO2: 24 mEq/L (ref 19–32)
Calcium: 10 mg/dL (ref 8.4–10.5)
Calcium: 9.3 mg/dL (ref 8.4–10.5)
Chloride: 103 mEq/L (ref 96–112)
Chloride: 108 mEq/L (ref 96–112)
Creatinine, Ser: 0.9 mg/dL (ref 0.50–1.10)
Creatinine, Ser: 1 mg/dL (ref 0.50–1.10)
GFR calc Af Amer: 73 mL/min — ABNORMAL LOW (ref >90)
GFR calc Af Amer: 83 mL/min — ABNORMAL LOW (ref >90)
GFR calc non Af Amer: 63 mL/min — ABNORMAL LOW (ref >90)
GFR calc non Af Amer: 71 mL/min — ABNORMAL LOW (ref >90)
Glucose, Bld: 79 mg/dL (ref 70–99)
Glucose, Bld: 95 mg/dL (ref 70–99)
Potassium: 2.7 mEq/L — CL (ref 3.5–5.1)
Potassium: 3.5 mEq/L (ref 3.5–5.1)
Sodium: 141 mEq/L (ref 135–145)
Sodium: 143 mEq/L (ref 135–145)

## 2012-04-24 LAB — TROPONIN I
Troponin I: <0.3 ng/mL (ref <0.30)
Troponin I: <0.3 ng/mL (ref <0.30)

## 2012-04-24 MED ORDER — PROCHLORPERAZINE MALEATE 10 MG PO TABS: 10.0000 mg | ORAL_TABLET | Freq: Two times a day (BID) | ORAL | Status: DC | PRN

## 2012-04-24 MED ORDER — POTASSIUM CHLORIDE CRYS ER 20 MEQ PO TBCR: 20.0000 meq | EXTENDED_RELEASE_TABLET | Freq: Two times a day (BID) | ORAL | Status: DC

## 2012-04-24 MED ORDER — POTASSIUM CHLORIDE CRYS ER 20 MEQ PO TBCR
60.0000 meq | EXTENDED_RELEASE_TABLET | Freq: Once | ORAL | Status: AC
  Administered 2012-04-24: 60 meq via ORAL
  Filled 2012-04-24: qty 3

## 2012-04-24 MED ORDER — IOHEXOL 350 MG/ML SOLN
80.0000 mL | Freq: Once | INTRAVENOUS | Status: AC | PRN
  Administered 2012-04-24: 80 mL via INTRAVENOUS

## 2012-04-24 MED ORDER — METOPROLOL TARTRATE 50 MG PO TABS
25.0000 mg | ORAL_TABLET | Freq: Once | ORAL | Status: DC
  Filled 2012-04-24: qty 1

## 2012-04-24 MED ORDER — DIPHENHYDRAMINE HCL 25 MG PO CAPS
25.0000 mg | ORAL_CAPSULE | Freq: Once | ORAL | Status: AC
  Administered 2012-04-24: 25 mg via ORAL
  Filled 2012-04-24: qty 1

## 2012-04-24 MED ORDER — ACETAMINOPHEN 325 MG PO TABS
650.0000 mg | ORAL_TABLET | Freq: Once | ORAL | Status: AC
  Administered 2012-04-24: 650 mg via ORAL
  Filled 2012-04-24: qty 2

## 2012-04-24 MED ORDER — METOCLOPRAMIDE HCL 10 MG PO TABS
10.0000 mg | ORAL_TABLET | Freq: Once | ORAL | Status: AC
  Administered 2012-04-24: 10 mg via ORAL
  Filled 2012-04-24: qty 1

## 2012-04-24 NOTE — ED Notes (Signed)
MD at bedside. 

## 2012-04-24 NOTE — ED Notes (Signed)
PO fluids provided. 

## 2012-04-24 NOTE — Telephone Encounter (Signed)
Discuss with patient and advise Referral coordinator as well.

## 2012-04-24 NOTE — Telephone Encounter (Signed)
[facility] Triage Call Report Triage Record Num: [id number] Operator: Ether Griffins Patient Name: Sarah Salazar Call Date & Time: 04/23/2012 9:09:05PM Patient Phone: [phone] PCP: Marga Melnick Patient Gender: Female PCP Fax : [phone] Patient DOB: 06-27-1955 Practice Name[facility] Reason for Call: Caller: Sarah Salazar/Patient; PCP: Marga Melnick; CB#: [phone]; Calling about BP 144/90 and HR 109 & 115,heart racing,difficulty breathing,shakes,sli chest pain,sleeping more. O2 sat 96. Onset 04/23/12. Has been tried on Verapamil,Losartin,Metoprolol for HTN--has had episodes of high BP and high HR over the last 3 weeks. Guideline: Breathing Problems. Disposition: Activate EMS 911. Reason for Disposition: New or worsening shortness of breath/difficulty breathing AND any other cardiac signs/symptoms for more than 5 minutes,now or within last hour. Advised to hang up and call 911. Protocol(s) Used: Breathing Problems Recommended Outcome per Protocol: Activate EMS 911 Reason for Outcome: New or worsening shortness of breath/difficulty breathing AND any other cardiac signs/symptoms for more than 5 minutes, now or within last hour Care Advice: ~

## 2012-04-24 NOTE — ED Notes (Signed)
Potassium 2.7 reported to EDP Palumbo and Alfonzo Feller, RN

## 2012-04-24 NOTE — Telephone Encounter (Signed)
Message left on VM, patient was seen in the ER 04/23/12, patient with low potassium 2.7. Patient states that she was told to call Dr.Hopper to have him advise on what to do about low potassium   Based on Med History, rx for 20 meq (BID) of potassium was printed off and given to the patient.   Hopp please advise

## 2012-04-24 NOTE — Telephone Encounter (Signed)
Check K=12/24 in am. Code: 276.8

## 2012-04-24 NOTE — Telephone Encounter (Signed)
Left message to call office

## 2012-04-24 NOTE — ED Notes (Signed)
Report received from Joss, RN, care assumed 

## 2012-04-24 NOTE — ED Notes (Signed)
EDP Bonk notified of pt's b/p 154/83- lopressor held

## 2012-04-24 NOTE — Telephone Encounter (Signed)
I have requested a cardiology consultation; this should be expedited in view of her persistent and recurrent symptoms and family history of supraventricular tachycardia. Additionally I have asked her to see her psychiatrist; if there is no cardiac etiology these may represent panic attacks.

## 2012-04-24 NOTE — ED Notes (Signed)
Pt seen here yesterday for same, increased BP

## 2012-04-24 NOTE — ED Provider Notes (Signed)
History   This chart was scribed for Sarah Skene, MD by Sarah Salazar, ED Scribe. The patient was seen in room MH08/MH08 and the patient's care was started at 3:26PM.     CSN: [id number]  Arrival date & time 04/24/12  1513   First MD Initiated Contact with Patient 04/24/12 1526      Chief Complaint  Patient presents with  . Hypertension    (Consider location/radiation/quality/duration/timing/severity/associated sxs/prior treatment) HPI  Sarah Salazar is a 56 y.o. female , with a hx of hypertension (beggining on 04/03/12) and undiagnosed Migraine, who presents to the Emergency Department complaining of sudden, progressively worsening, generalized headache, onset today (04/24/12). Associated symptoms include increased thirst, dry eyes, and increased urinary frequency. The pt reports she is experiencing a pounding headache, rated at a 6/10 at present. The pt has taken verapamil and metoprolol which do not provide relief of the headache. The pt informs she has not taken hydrochlorothiazide nor lasix to attempt to alleviate her headache symptoms. Patient was seen in emergency department last night with a headache, chest pain shortness of breath and says her headache has returned.  The pt denies chest pain, shortness of breath, blurred vision, nausea, photophobia, vomiting, abdominal pain, diarrhea, hematochezia, and melena.   The pt does not smoke, however, she does drink alcohol on an occasional basis.   PCP is Dr. Alwyn Ren. The pt is due to follow up with Dr. Heywood Iles    Past Medical History  Diagnosis Date  . IBS (irritable bowel syndrome)   . Anxiety   . Depression   . Thyroid disease     hypothyroidism  . Migraine     Past Surgical History  Procedure Date  . Cholecystectomy   . Dilation and curettage of uterus     x3  . Knee surgery     x2  . Wrist surgery     x3  . Laparoscopy 06/03/1990    to evaluate dysfunctional menses & pelvic pain  . Breast lumpectomy     left  .  Cystocele repair 01/13/09  . Upper gastrointestinal endoscopy 2002  . Colonoscopy 2002  . Mastectomy, partial     benign fibrocystic changes    Family History  Problem Relation Age of Onset  . Coronary artery disease Mother   . Stroke Mother     TIA  . Uterine cancer Mother     BREAST  . Lung cancer Father     LUNG  . Heart disease Father   . Colon cancer Paternal Uncle     COLON; STOMACH  . COPD Father   . Thyroid disease Mother     goiter  . Lung cancer Maternal Grandfather   . Coronary artery disease Father   . Rheum arthritis Maternal Aunt   . Supraventricular tachycardia Daughter     S/P ablation    History  Substance Use Topics  . Smoking status: Never Smoker   . Smokeless tobacco: Never Used  . Alcohol Use: Yes     Comment: occ    OB History    Grav Para Term Preterm Abortions TAB SAB Ect Mult Living                  Review of Systems    At least 10pt or greater review of systems completed and are negative except where specified in the HPI.   Allergies  Amoxicillin; Benzonatate; Fentanyl; Midazolam; Oxycodone-aspirin; Sulfonamide derivatives; Hydrocodone; Nalbuphine; Oxycodone-acetaminophen; Venlafaxine; and Verapamil  Home Medications  Current Outpatient Rx  Name  Route  Sig  Dispense  Refill  . ALPRAZOLAM 0.5 MG PO TABS   Oral   Take 0.25-0.5 mg by mouth 2 (two) times daily as needed. For anxiety         . BUPROPION HCL 100 MG PO TABS   Oral   Take 300 mg by mouth daily.          . CYCLOBENZAPRINE HCL 5 MG PO TABS      1-2 qhs prn   20 tablet   0   . DEXLANSOPRAZOLE 60 MG PO CPDR   Oral   Take 1 capsule (60 mg total) by mouth daily.   30 capsule   5   . HYOSCYAMINE SULFATE 0.125 MG SL SUBL   Sublingual   Place 0.125 mg under the tongue every 4 (four) hours as needed.         . IBUPROFEN 800 MG PO TABS   Oral   Take 800 mg by mouth every 8 (eight) hours as needed. For pain          . LAMOTRIGINE 150 MG PO TABS    Oral   Take 300 mg by mouth at bedtime. For bi polar disorder         . LEVOTHYROXINE SODIUM 100 MCG PO TABS   Oral   Take 1 tablet (100 mcg total) by mouth daily.   90 tablet   0   . LOSARTAN POTASSIUM 100 MG PO TABS   Oral   Take 1 tablet (100 mg total) by mouth daily.   30 tablet   0   . LURASIDONE HCL 60 MG PO TABS   Oral   Take by mouth.         . METOPROLOL TARTRATE 25 MG PO TABS      1& 1/2 bid if BP averages > 140/90   60 tablet   0   . OMEPRAZOLE 40 MG PO CPDR   Oral   Take 1 capsule (40 mg total) by mouth daily.   30 capsule   3   . POTASSIUM CHLORIDE CRYS ER 20 MEQ PO TBCR   Oral   Take by mouth daily. 2 tablets once daily EXCEPT 3 tablets on  Tuesday, Thursday, and Sunday         . POTASSIUM CHLORIDE CRYS ER 20 MEQ PO TBCR   Oral   Take 1 tablet (20 mEq total) by mouth 2 (two) times daily.   14 tablet   0   . TRAMADOL HCL 50 MG PO TABS      1 by mouth every 6 hours as needed for cough   30 tablet   1     She had taken the tramadol without any adverse eff ...     BP 193/111  Pulse 109  Temp 98.7 F (37.1 C) (Oral)  Resp 16  Ht 5\' 4"  (1.626 m)  Wt 173 lb (78.472 kg)  BMI 29.70 kg/m2  SpO2 100%  Physical Exam  Nursing notes reviewed.  Electronic medical record reviewed. VITAL SIGNS:   Filed Vitals:   04/24/12 1613 04/24/12 1705 04/24/12 1727 04/24/12 1842  BP: 154/83 165/88 148/86 159/84  Pulse: 62  57 64  Temp:      TempSrc:      Resp: 11 15 16 16   Height:      Weight:      SpO2: 99% 99% 99% 100%   CONSTITUTIONAL: Awake, oriented, appears  non-toxic HENT: Atraumatic, normocephalic, oral mucosa pink and moist, airway patent. Nares patent without drainage. External ears normal. EYES: Conjunctiva clear, EOMI, PERRLA NECK: Trachea midline, non-tender, supple CARDIOVASCULAR: Normal heart rate, Normal rhythm, No murmurs, rubs, gallops PULMONARY/CHEST: Clear to auscultation, no rhonchi, wheezes, or rales. Symmetrical breath  sounds. Non-tender. ABDOMINAL: Non-distended, soft, non-tender - no rebound or guarding.  BS normal. NEUROLOGIC: Non-focal, moving all four extremities, no gross sensory or motor deficits. EXTREMITIES: No clubbing, cyanosis, or edema SKIN: Warm, Dry, No erythema, No rash  ED Course  Procedures (including critical care time)  Date: 04/24/2012  Rate: 58   Rhythm: sinus bradycardia   QRS Axis: normal  Intervals: normal  ST/T Wave abnormalities: normal  Conduction Disutrbances: none  Narrative Interpretation: unremarkable - sinus bradycardia without any indication of ST or T wave abnormalities   DIAGNOSTIC STUDIES: Oxygen Saturation is 100% on room air, normal by my interpretation.    COORDINATION OF CARE:   3:53 PM- Treatment plan concerning evaluation of potassium levels discussed with patient. Pt agrees with treatment.  5:48PM- recheck. Pt reports feeling better. The pt rates her headache at a 3/10 at this time. Treatment plan concerning follow up with cardiologist discussed with patient. Pt agrees with treatment.  6:01PM- Phone consultation with Dr. Graciela Husbands, Cardiologist, concerning pt condition and treatment.  6:52 PM- Recheck. Treatment plan discussed with patient. Pt agrees with treatment.        Results for orders placed during the hospital encounter of 04/24/12  BASIC METABOLIC PANEL      Component Value Range   Sodium 143  135 - 145 mEq/L   Potassium 3.5  3.5 - 5.1 mEq/L   Chloride 108  96 - 112 mEq/L   CO2 24  19 - 32 mEq/L   Glucose, Bld 95  70 - 99 mg/dL   BUN 8  6 - 23 mg/dL   Creatinine, Ser 2.13  0.50 - 1.10 mg/dL   Calcium 9.3  8.4 - 08.6 mg/dL   GFR calc non Af Amer 71 (*) >90 mL/min   GFR calc Af Amer 83 (*) >90 mL/min     Dg Chest 2 View  04/23/2012  *RADIOLOGY REPORT*  Clinical Data: Chest pain.  CHEST - 2 VIEW  Comparison: PA and lateral chest 09/01/2011 and 04/17/2012.  Findings: Lungs are clear.  Heart size is normal.  No pneumothorax or pleural  fluid.  IMPRESSION: Negative chest.   Original Report Authenticated By: Holley Dexter, M.D.    Ct Angio Chest W/cm &/or Wo Cm  04/24/2012  *RADIOLOGY REPORT*  Clinical Data: Pleuritic chest pain for 1 day.  CT ANGIOGRAPHY CHEST  Technique:  Multidetector CT imaging of the chest using the standard protocol during bolus administration of intravenous contrast. Multiplanar reconstructed images including MIPs were obtained and reviewed to evaluate the vascular anatomy.  Contrast: 80mL OMNIPAQUE IOHEXOL 350 MG/ML SOLN  Comparison: PA and lateral chest 04/23/2012.  Findings: No pulmonary embolus is identified.  Very small hiatal hernia is noted.  Heart size is normal.  There is no axillary, hilar or mediastinal lymphadenopathy.  The lungs are clear. Incidentally imaged upper abdomen shows postoperative change of cholecystectomy.  No focal bony abnormality is identified.  IMPRESSION:  1.  Negative for pulmonary embolus or acute disease. 2.  Very small hiatal hernia. 3.  Status post cholecystectomy.   Original Report Authenticated By: Holley Dexter, M.D.      1. Migraine headache   2. Hypertension  MDM  BEENA CATANO is a 56 y.o. female who is been seen multiple times in the last week by her primary care provider and in the emergency department, has had a CT angiogram of her chest as well as multiple negative troponins and normal EKGs for presentation of chest pain. Patient is not complaining about chest pain now just a headache associated with her hypertension. I think the hypertension and headache is likely 2 true true and unrelated phenomena. She also had some hypokalemia which was treated with oral potassium. We'll check a BMP and an EKG as well as treating her headache.   Reevaluation, the patient's heart rate is in the 60s and her blood pressure is in the 140s to 160s systolic. I do not think she can tolerate any metoprolol I think the elevated blood pressure likely secondary to headache  pain. Again we'll treat her headache. EKG shows sinus bradycardia with a rate of 58, no ischemic or changes consistent with infarction.  Chest pain after treatment for her headache is down to a 3/10. Patient has multiple followup appointments with [facility] cardiology for her chest pain and shortness of breath on Thursday. I touch base with [place] cardiology, I do not think any further medication is indicated at this time, and cardiology agrees. She can followup as an outpatient. Patient also needs to followup with psychiatry-she is supposed to have a followup with psychiatry today but did not go to that appointment because of her headache.  I explained the diagnosis and have given explicit precautions to return to the ER including rest pain, shortness of breath, worsening headache or any other new or worsening symptoms. The patient understands and accepts the medical plan as it's been dictated and I have answered their questions. Discharge instructions concerning home care and prescriptions have been given.  The patient is STABLE and is discharged to home in good condition.   I personally performed the services described in this documentation, which was scribed in my presence. The recorded information has been reviewed and is accurate. Sarah Salazar, M.D.      Sarah Skene, MD 04/25/12 1610

## 2012-04-25 ENCOUNTER — Encounter: Payer: Self-pay | Admitting: Cardiology

## 2012-04-25 DIAGNOSIS — E039 Hypothyroidism, unspecified: Secondary | ICD-10-CM | POA: Insufficient documentation

## 2012-04-25 DIAGNOSIS — F32A Depression, unspecified: Secondary | ICD-10-CM | POA: Insufficient documentation

## 2012-04-25 DIAGNOSIS — K589 Irritable bowel syndrome without diarrhea: Secondary | ICD-10-CM | POA: Insufficient documentation

## 2012-04-25 DIAGNOSIS — R072 Precordial pain: Secondary | ICD-10-CM | POA: Insufficient documentation

## 2012-04-25 DIAGNOSIS — G43909 Migraine, unspecified, not intractable, without status migrainosus: Secondary | ICD-10-CM | POA: Insufficient documentation

## 2012-04-25 DIAGNOSIS — F419 Anxiety disorder, unspecified: Secondary | ICD-10-CM | POA: Insufficient documentation

## 2012-04-25 DIAGNOSIS — F329 Major depressive disorder, single episode, unspecified: Secondary | ICD-10-CM | POA: Insufficient documentation

## 2012-04-25 NOTE — Telephone Encounter (Signed)
Pt seen again in ED on last night see encounter. Discuss with patient, fasting lab appt schedule for Thursday morning.

## 2012-04-25 NOTE — Telephone Encounter (Signed)
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

## 2012-04-27 ENCOUNTER — Other Ambulatory Visit (INDEPENDENT_AMBULATORY_CARE_PROVIDER_SITE_OTHER): Payer: 59

## 2012-04-27 ENCOUNTER — Encounter: Payer: Self-pay | Admitting: Cardiology

## 2012-04-27 ENCOUNTER — Ambulatory Visit (INDEPENDENT_AMBULATORY_CARE_PROVIDER_SITE_OTHER): Payer: 59 | Admitting: Cardiology

## 2012-04-27 ENCOUNTER — Telehealth: Payer: Self-pay | Admitting: *Deleted

## 2012-04-27 VITALS — BP 168/100 | HR 112 | Ht 64.0 in | Wt 177.2 lb

## 2012-04-27 DIAGNOSIS — I10 Essential (primary) hypertension: Secondary | ICD-10-CM

## 2012-04-27 DIAGNOSIS — R002 Palpitations: Secondary | ICD-10-CM

## 2012-04-27 DIAGNOSIS — F411 Generalized anxiety disorder: Secondary | ICD-10-CM

## 2012-04-27 DIAGNOSIS — R51 Headache: Secondary | ICD-10-CM

## 2012-04-27 DIAGNOSIS — F419 Anxiety disorder, unspecified: Secondary | ICD-10-CM

## 2012-04-27 DIAGNOSIS — R079 Chest pain, unspecified: Secondary | ICD-10-CM

## 2012-04-27 DIAGNOSIS — E876 Hypokalemia: Secondary | ICD-10-CM

## 2012-04-27 LAB — BASIC METABOLIC PANEL
BUN: 9 mg/dL (ref 6–23)
CO2: 27 mEq/L (ref 19–32)
Calcium: 8.6 mg/dL (ref 8.4–10.5)
Chloride: 104 mEq/L (ref 96–112)
Creatinine, Ser: 1 mg/dL (ref 0.4–1.2)
GFR: 62.8 mL/min (ref >60.00)
Glucose, Bld: 106 mg/dL — ABNORMAL HIGH (ref 70–99)
Potassium: 3 mEq/L — ABNORMAL LOW (ref 3.5–5.1)
Sodium: 138 mEq/L (ref 135–145)

## 2012-04-27 LAB — BUN: BUN: 9 mg/dL (ref 6–23)

## 2012-04-27 LAB — CORTISOL: Cortisol, Plasma: 13 ug/dL

## 2012-04-27 LAB — CREATININE, SERUM: Creatinine, Ser: 1 mg/dL (ref 0.4–1.2)

## 2012-04-27 NOTE — Patient Instructions (Addendum)
Your physician recommends that you schedule a follow-up appointment in: February  Your physician has requested that you have an echocardiogram. Echocardiography is a painless test that uses sound waves to create images of your heart. It provides your doctor with information about the size and shape of your heart and how well your heart's chambers and valves are working. This procedure takes approximately one hour. There are no restrictions for this procedure.  Your physician has requested that you have a lexiscan myoview. For further information please visit [email or web address] Please follow instruction sheet, as given.  We will call you with the results of your tests

## 2012-04-27 NOTE — Assessment & Plan Note (Signed)
The patient does appear to be anxious today. I am not able to assess this any further. I told her that I would do my best to be sure that we check out her heart.

## 2012-04-27 NOTE — Progress Notes (Signed)
HPI  Patient is seen today for cardiology evaluation. She feels poorly in general. She's had blood pressure swings. She takes her vital signs very frequently. She's had some tachycardia. She has not had any documented arrhythmias. She was seen in the emergency room December 22nd, 2013. Troponin was normal. Chest CT was normal with no evidence of pulmonary embolus.   As part of today's evaluation I carefully reviewed her emergency room records. I've reviewed the primary care notes done recently. I have reviewed the labs and x-rays done at the hospital recently.  Allergies  Allergen Reactions  . Amoxicillin Rash       . Benzonatate Rash    Tessalon Perles and amoxicillin were being taken simultaneously when she developed a rash  . Fentanyl Rash    given with Versed  . Midazolam Rash    given with Fentanyl  . Oxycodone-Aspirin Rash  . Sulfonamide Derivatives Rash    rash  . Hydrocodone Hives and Nausea And Vomiting  . Nalbuphine Nausea And Vomiting  . Oxycodone-Acetaminophen Hives and Nausea And Vomiting  . Venlafaxine Other (See Comments)    Patient says makes her crazy  . Verapamil     Caused "pounding " in chest    Current Outpatient Prescriptions  Medication Sig Dispense Refill  . ALPRAZolam (XANAX) 0.5 MG tablet Take 0.25-0.5 mg by mouth 2 (two) times daily as needed. For anxiety      . buPROPion (WELLBUTRIN) 100 MG tablet Take 300 mg by mouth daily.       . cyclobenzaprine (FLEXERIL) 5 MG tablet 1-2 qhs prn  20 tablet  0  . dexlansoprazole (DEXILANT) 60 MG capsule Take 1 capsule (60 mg total) by mouth daily.  30 capsule  5  . hyoscyamine (LEVSIN SL) 0.125 MG SL tablet Place 0.125 mg under the tongue every 4 (four) hours as needed.      Marland Kitchen ibuprofen (ADVIL,MOTRIN) 800 MG tablet Take 800 mg by mouth every 8 (eight) hours as needed. For pain       . lamoTRIgine (LAMICTAL) 150 MG tablet Take 300 mg by mouth at bedtime. For bi polar disorder      . levothyroxine (SYNTHROID,  LEVOTHROID) 100 MCG tablet Take 1 tablet (100 mcg total) by mouth daily.  90 tablet  0  . losartan (COZAAR) 100 MG tablet Take 1 tablet (100 mg total) by mouth daily.  30 tablet  0  . Lurasidone HCl (LATUDA) 60 MG TABS Take by mouth.      . metoprolol tartrate (LOPRESSOR) 25 MG tablet 1& 1/2 bid if BP averages > 140/90  60 tablet  0  . potassium chloride SA (K-DUR,KLOR-CON) 20 MEQ tablet Take by mouth daily. 2 tablets once daily EXCEPT 3 tablets on  Tuesday, Thursday, and Sunday      . prochlorperazine (COMPAZINE) 10 MG tablet Take 1 tablet (10 mg total) by mouth 2 (two) times daily as needed. Take with Benadryl 25mg .  20 tablet  0  . traMADol (ULTRAM) 50 MG tablet 1 by mouth every 6 hours as needed for cough  30 tablet  1  . omeprazole (PRILOSEC) 40 MG capsule Take 1 capsule (40 mg total) by mouth daily.  30 capsule  3    History   Social History  . Marital Status: Divorced    Spouse Name: N/A    Number of Children: N/A  . Years of Education: N/A   Occupational History  . CUSTOMER SERVICE    Social History Main  Topics  . Smoking status: Never Smoker   . Smokeless tobacco: Never Used  . Alcohol Use: Yes     Comment: occ  . Drug Use: No  . Sexually Active: Not on file   Other Topics Concern  . Not on file   Social History Narrative   DAILY CAFFEINE USE--1 CUPCOLONOSCOPY AND ENDOSCOPY NEG X2    Family History  Problem Relation Age of Onset  . Coronary artery disease Mother   . Stroke Mother     TIA  . Uterine cancer Mother     BREAST  . Lung cancer Father     LUNG  . Heart disease Father   . Colon cancer Paternal Uncle     COLON; STOMACH  . COPD Father   . Thyroid disease Mother     goiter  . Lung cancer Maternal Grandfather   . Coronary artery disease Father   . Rheum arthritis Maternal Aunt   . Supraventricular tachycardia Daughter     S/P ablation    Past Medical History  Diagnosis Date  . IBS (irritable bowel syndrome)   . Anxiety   . Depression   .  Hypothyroidism     hypothyroidism  . Migraine   . Chest pain     Emergency room April 23, 2012, troponin is normal, chest CT scan showed no pulmonary embolus  . Cough     Chronic cough    Past Surgical History  Procedure Date  . Cholecystectomy   . Dilation and curettage of uterus     x3  . Knee surgery     x2  . Wrist surgery     x3  . Laparoscopy 06/03/1990    to evaluate dysfunctional menses & pelvic pain  . Breast lumpectomy     left  . Cystocele repair 01/13/09  . Upper gastrointestinal endoscopy 2002  . Colonoscopy 2002  . Mastectomy, partial     benign fibrocystic changes    Patient Active Problem List  Diagnosis  . B12 DEFICIENCY  . HYPERLIPIDEMIA NEC/NOS  . HYPOKALEMIA  . BIPOLAR DISORDER UNSPECIFIED  . IBS  . ASTERIXIS  . FASTING HYPERGLYCEMIA  . Hallucinations  . HEADACHE  . Chronic cough  . IBS (irritable bowel syndrome)  . Anxiety  . Depression  . Hypothyroidism  . Migraine  . Chest pain    ROS   The patient denies fever, chills, sweats, change in vision, GI or urinary symptoms. She does have headaches.  PHYSICAL EXAM  Patient is oriented to person time and place. She has a few tears today. She is upset because she is hesitant to only see a psychiatrist for her ongoing problems. There is no jugulovenous distention. Lungs are clear. Respiratory effort is nonlabored. Cardiac exam reveals S1 and S2. There no clicks or significant murmurs. The abdomen is soft. Is no peripheral edema. There no musculoskeletal deformities. No skin rashes.  Filed Vitals:   04/27/12 1412  BP: 168/100  Pulse: 112  Height: 5\' 4"  (1.626 m)  Weight: 177 lb 3.2 oz (80.377 kg)  SpO2: 99%   I reviewed the EKG that was done in the emergency room recently. It is normal.  ASSESSMENT & PLAN

## 2012-04-27 NOTE — Assessment & Plan Note (Addendum)
The patient has had chest pain. There's been no evidence of myocardial infarction. Situation is quite difficult and she does need a stress study. She is on a beta blocker. I feel that walking her on a treadmill will not provide helpful information. We will try to arrange a pharmacologic nuclear scan.  In addition two-dimensional echo will be done to assess LV function. I've chosen not to do any type of monitoring of her heart rate at this time.

## 2012-04-27 NOTE — Assessment & Plan Note (Signed)
I have reviewed in the record that the patient has had a head CT with no significant abnormality found.

## 2012-04-28 ENCOUNTER — Ambulatory Visit (HOSPITAL_COMMUNITY): Payer: 59 | Attending: Cardiology

## 2012-04-28 DIAGNOSIS — R072 Precordial pain: Secondary | ICD-10-CM

## 2012-04-28 DIAGNOSIS — R002 Palpitations: Secondary | ICD-10-CM

## 2012-04-28 DIAGNOSIS — I379 Nonrheumatic pulmonary valve disorder, unspecified: Secondary | ICD-10-CM | POA: Insufficient documentation

## 2012-04-28 DIAGNOSIS — I10 Essential (primary) hypertension: Secondary | ICD-10-CM | POA: Insufficient documentation

## 2012-04-28 DIAGNOSIS — I059 Rheumatic mitral valve disease, unspecified: Secondary | ICD-10-CM | POA: Insufficient documentation

## 2012-04-28 DIAGNOSIS — I369 Nonrheumatic tricuspid valve disorder, unspecified: Secondary | ICD-10-CM | POA: Insufficient documentation

## 2012-04-28 NOTE — Telephone Encounter (Signed)
Patient called stating she does not understand her most recent lab results. She states her potassium is still low and her blood glucose was high and she does not understand why we are not doing more for her. Patient is upset about this matter and states she feels like she is getting the "run around." She would like someone to call her back.

## 2012-04-28 NOTE — Telephone Encounter (Signed)
Discuss with patient. Pt was concern about potassium level advise Pt of Dr hopper recommendation to increase Potassium (K+) increase citrus fruits & bananas in diet and use the salt substitute No Salt, which contains potassium , to season food @ the table. Recheck K+ after 2 weeks. Pt was also concern about glucose level. Pt advise that glucose was only mildly elevated and that Dr Alwyn Ren did not make any recommendation for this. Pt verbalized understanding and states that she has no further concerns at this time.

## 2012-04-28 NOTE — Progress Notes (Signed)
Echocardiogram performed.  

## 2012-05-01 ENCOUNTER — Ambulatory Visit (HOSPITAL_COMMUNITY): Payer: 59 | Attending: Cardiology | Admitting: Radiology

## 2012-05-01 VITALS — BP 142/80 | Ht 64.0 in | Wt 174.0 lb

## 2012-05-01 DIAGNOSIS — R002 Palpitations: Secondary | ICD-10-CM | POA: Insufficient documentation

## 2012-05-01 DIAGNOSIS — R0602 Shortness of breath: Secondary | ICD-10-CM

## 2012-05-01 DIAGNOSIS — E119 Type 2 diabetes mellitus without complications: Secondary | ICD-10-CM | POA: Insufficient documentation

## 2012-05-01 DIAGNOSIS — R0789 Other chest pain: Secondary | ICD-10-CM | POA: Insufficient documentation

## 2012-05-01 DIAGNOSIS — R0989 Other specified symptoms and signs involving the circulatory and respiratory systems: Secondary | ICD-10-CM | POA: Insufficient documentation

## 2012-05-01 DIAGNOSIS — R000 Tachycardia, unspecified: Secondary | ICD-10-CM | POA: Insufficient documentation

## 2012-05-01 DIAGNOSIS — R42 Dizziness and giddiness: Secondary | ICD-10-CM | POA: Insufficient documentation

## 2012-05-01 DIAGNOSIS — R079 Chest pain, unspecified: Secondary | ICD-10-CM

## 2012-05-01 DIAGNOSIS — J45909 Unspecified asthma, uncomplicated: Secondary | ICD-10-CM | POA: Insufficient documentation

## 2012-05-01 DIAGNOSIS — R0609 Other forms of dyspnea: Secondary | ICD-10-CM | POA: Insufficient documentation

## 2012-05-01 DIAGNOSIS — I10 Essential (primary) hypertension: Secondary | ICD-10-CM | POA: Insufficient documentation

## 2012-05-01 MED ORDER — TECHNETIUM TC 99M SESTAMIBI GENERIC - CARDIOLITE
10.0000 | Freq: Once | INTRAVENOUS | Status: AC | PRN
  Administered 2012-05-01: 10 via INTRAVENOUS

## 2012-05-01 MED ORDER — AMINOPHYLLINE 25 MG/ML IV SOLN
75.0000 mg | Freq: Once | INTRAVENOUS | Status: AC
  Administered 2012-05-01: 75 mg via INTRAVENOUS

## 2012-05-01 MED ORDER — REGADENOSON 0.4 MG/5ML IV SOLN
0.4000 mg | Freq: Once | INTRAVENOUS | Status: AC
  Administered 2012-05-01: 0.4 mg via INTRAVENOUS

## 2012-05-01 MED ORDER — TECHNETIUM TC 99M SESTAMIBI GENERIC - CARDIOLITE
30.0000 | Freq: Once | INTRAVENOUS | Status: AC | PRN
  Administered 2012-05-01: 30 via INTRAVENOUS

## 2012-05-01 NOTE — Progress Notes (Signed)
[facility] SITE 3 NUCLEAR MED [address] [place], [place] [address] [phone]    Cardiology Nuclear Med Study  Sarah Salazar is a 56 y.o. female     MRN : [id number]     DOB: 11-05-1955  Procedure Date: 05/01/2012  Nuclear Med Background Indication for Stress Test:  Evaluation for Ischemia and Post Hospital:04/24/12 ER with Chest pain,(-) troponins History:  Asthma and 04/28/12 Echo:EF=55-60% Cardiac Risk Factors: Family History - CAD, Hypertension, Lipids and NIDDM  Symptoms:  Chest Pain/Tightness, Diaphoresis, Dizziness, DOE, Fatigue, Nausea, Palpitations, Rapid HR and SOB   Nuclear Pre-Procedure Caffeine/Decaff Intake:  None NPO After: 9:00pm   Lungs:  clear O2 Sat: 98% on room air. IV 0.9% NS with Angio Cath:  22g  IV Site: R Forearm  IV Started by:  Cathlyn Parsons, RN  Chest Size (in):  36 Cup Size: C  Height: 5\' 4"  (1.626 m)  Weight:  174 lb (78.926 kg)  BMI:  Body mass index is 29.87 kg/(m^2). Tech Comments:  Lopressor taken at 2200    Nuclear Med Study 1 or 2 day study: 1 day  Stress Test Type:  Treadmill/Lexiscan  Reading MD: Olga Millers, MD  Order Authorizing Provider:  Effie Shy  Resting Radionuclide: Technetium 31m Sestamibi  Resting Radionuclide Dose: 11.0 mCi   Stress Radionuclide:  Technetium 29m Sestamibi  Stress Radionuclide Dose: 33.0 mCi           Stress Protocol Rest HR: 63 Stress HR: 114  Rest BP: 142/80 Stress BP: 143/73  Exercise Time (min): 2:00 METS: 1.6   Predicted Max HR: 166 bpm % Max HR: 68.67 bpm Rate Pressure Product: 84132    Dose of Adenosine (mg):  n/a Dose of Lexiscan: 0.4 mg  Dose of Atropine (mg): n/a Dose of Dobutamine: n/a mcg/kg/min (at max HR)  Stress Test Technologist: Cathlyn Parsons, RN  Nuclear Technologist:  Domenic Polite, CNMT     Rest Procedure:  Myocardial perfusion imaging was performed at rest 45 minutes following the intravenous administration of Technetium 48m  Sestamibi. Rest ECG: NSR - Normal EKG  Stress Procedure:  The patient received IV Lexiscan 0.4 mg over 15-seconds with concurrent low level exercise and then Technetium 52m Sestamibi was injected at 30-seconds while the patient continued walking one more minute. Patient had nausea and headache with infusion.   Quantitative spect images were obtained after a 45-minute delay. Patient c/o of continued Headache and nausea. Aminophylline 75 mg given IV. Stress ECG: No significant ST segment change suggestive of ischemia.  QPS Raw Data Images:  Acquisition technically good; normal left ventricular size. Stress Images:  Normal homogeneous uptake in all areas of the myocardium. Rest Images:  Normal homogeneous uptake in all areas of the myocardium. Subtraction (SDS):  No evidence of ischemia. Transient Ischemic Dilatation (Normal <1.22):  1.05 Lung/Heart Ratio (Normal <0.45):  0.37  Quantitative Gated Spect Images QGS EDV:  61 ml QGS ESV:  15 ml  Impression Exercise Capacity:  Lexiscan with no exercise. BP Response:  Normal blood pressure response. Clinical Symptoms:  No chest pain or dyspnea. ECG Impression:  No significant ST segment change suggestive of ischemia. Comparison with Prior Nuclear Study: No images to compare  Overall Impression:  Normal stress nuclear study.  LV Ejection Fraction: 75%.  LV Wall Motion:  NL LV Function; NL Wall Motion   Olga Millers

## 2012-05-04 ENCOUNTER — Encounter: Payer: Self-pay | Admitting: Internal Medicine

## 2012-05-04 ENCOUNTER — Ambulatory Visit (INDEPENDENT_AMBULATORY_CARE_PROVIDER_SITE_OTHER): Payer: 59 | Admitting: Internal Medicine

## 2012-05-04 VITALS — BP 160/92 | HR 108 | Wt 178.6 lb

## 2012-05-04 DIAGNOSIS — R51 Headache: Secondary | ICD-10-CM

## 2012-05-04 DIAGNOSIS — I10 Essential (primary) hypertension: Secondary | ICD-10-CM

## 2012-05-04 DIAGNOSIS — E876 Hypokalemia: Secondary | ICD-10-CM

## 2012-05-04 DIAGNOSIS — R7989 Other specified abnormal findings of blood chemistry: Secondary | ICD-10-CM

## 2012-05-04 MED ORDER — TOPIRAMATE 25 MG PO TABS: 25.0000 mg | ORAL_TABLET | Freq: Two times a day (BID) | ORAL | Status: DC

## 2012-05-04 MED ORDER — SPIRONOLACTONE 25 MG PO TABS: 25.0000 mg | ORAL_TABLET | Freq: Every day | ORAL | Status: DC

## 2012-05-04 NOTE — Patient Instructions (Addendum)
Please  schedule  Labs in 1 week : BMET,A1c.PLEASE BRING THESE INSTRUCTIONS TO FOLLOW UP  LAB APPOINTMENT.This will guarantee correct labs are drawn, eliminating need for repeat blood sampling ( needle sticks ! ). Diagnoses /Codes: 276.8,790.29

## 2012-05-04 NOTE — Progress Notes (Signed)
  Subjective:    Patient ID: Sarah Salazar, female    DOB: [birth year], 57 y.o.   MRN: [id number]  HPI  The chemical stress test and echocardiogram were reviewed; these were both normal. She did have an increase in her headaches after administration of the imaging agent.  Blood pressure has continued to be markedly variable with ranges of 132/81-187/104.    Review of Systems She is concerned about lab results;. potassium is very from 2.7-3.6. Cortisol levels were normal. Pending are reddened and aldosterone levels     Objective:   Physical Exam  She appears healthy and well-nourished;she is in no acute distress  No carotid bruits are present.  Heart rhythm and rate are normal with no significant murmurs or gallops.  Chest is clear with no increased work of breathing  There is no evidence of aortic aneurysm or renal artery bruits  She has no clubbing or edema.   Pedal pulses are intact   No ischemic skin changes are present         Assessment & Plan:

## 2012-05-04 NOTE — Assessment & Plan Note (Signed)
Add Spironolactone 25 mg bid

## 2012-05-04 NOTE — Assessment & Plan Note (Signed)
KCL 20 meq twice a day

## 2012-05-05 LAB — ALDOSTERONE + RENIN ACTIVITY W/ RATIO
ALDO / PRA Ratio: 1.2 Ratio (ref 0.9–28.9)
Aldosterone: 1 ng/dL
PRA LC/MS/MS: 0.81 ng/mL/h (ref 0.25–5.82)

## 2012-05-07 ENCOUNTER — Encounter: Payer: Self-pay | Admitting: Cardiology

## 2012-05-08 ENCOUNTER — Telehealth: Payer: Self-pay | Admitting: Internal Medicine

## 2012-05-08 NOTE — Telephone Encounter (Signed)
Hopp please advise  

## 2012-05-08 NOTE — Telephone Encounter (Signed)
Please disregard request for work note. Patient scheduled appt for tomorrow, 05/09/12.

## 2012-05-08 NOTE — Telephone Encounter (Signed)
pt needs work release note so patient can return to work Pt stated she has been out since Thanksgiving and needs a note sent to att: Josephina Shih fax# [phone] Please call patient back at [phone] once done

## 2012-05-08 NOTE — Telephone Encounter (Signed)
Left message on VM informing patient we can further discuss at appointment tomorrow. Patient to call prior to appointment if needed

## 2012-05-08 NOTE — Telephone Encounter (Signed)
Please verify prior dates she was out of work. This was not an extended period but isolated days when she was seen here or in the emergency room.

## 2012-05-09 ENCOUNTER — Ambulatory Visit (INDEPENDENT_AMBULATORY_CARE_PROVIDER_SITE_OTHER): Payer: 59 | Admitting: Internal Medicine

## 2012-05-09 ENCOUNTER — Encounter: Payer: Self-pay | Admitting: Internal Medicine

## 2012-05-09 VITALS — BP 136/88 | HR 102 | Resp 12 | Wt 177.4 lb

## 2012-05-09 DIAGNOSIS — L03019 Cellulitis of unspecified finger: Secondary | ICD-10-CM

## 2012-05-09 DIAGNOSIS — L609 Nail disorder, unspecified: Secondary | ICD-10-CM

## 2012-05-09 DIAGNOSIS — IMO0001 Reserved for inherently not codable concepts without codable children: Secondary | ICD-10-CM

## 2012-05-09 DIAGNOSIS — I10 Essential (primary) hypertension: Secondary | ICD-10-CM

## 2012-05-09 DIAGNOSIS — L608 Other nail disorders: Secondary | ICD-10-CM

## 2012-05-09 DIAGNOSIS — G43809 Other migraine, not intractable, without status migrainosus: Secondary | ICD-10-CM

## 2012-05-09 MED ORDER — KETOCONAZOLE 2 % EX CREA: TOPICAL_CREAM | Freq: Two times a day (BID) | CUTANEOUS | Status: DC

## 2012-05-09 NOTE — Progress Notes (Signed)
  Subjective:    Patient ID: Sarah Salazar, female    DOB: 1956-02-26, 57 y.o.   MRN: [id number]  HPI   She has had chronic nail changes especially the left great toenail and the fourth left finger nail for years. Over-the-counter topical agents have not been of benefit. She's never tried prescription agents for this     Review of Systems BP improving with med regimen; over 48 hrs 130s-140s/ 80s. Previously BP was checked only when having headaches , tachycardia or orthopnea. Headaches significantly improved with Topamax prophylactically. Orthopnea also improved with meds.      Objective:   Physical Exam She appears healthy and well-nourished; she is in no acute distress  No carotid bruits are present.  Heart rhythm and rate are normal with no significant murmurs or gallops.S4 present  Chest is clear with no increased work of breathing  There is no evidence of aortic aneurysm or renal artery bruits  Abdomen: bowel sounds normal, soft and non-tender without masses, organomegaly or hernias noted.  No guarding or rebound   She has no clubbing or edema.   Pedal pulses are intact   No ischemic skin changes are present . She has chronic deformity of the left great toenail. There is no onycholysis or active fungal infection suggested. Low-grade paronychia I suggested of the fourth left finger nail laterally.        Assessment & Plan:  #1 chronic nail changes with paronychia #2 HTN improved #3 headache improved Plan: See orders and recommendations

## 2012-05-09 NOTE — Patient Instructions (Addendum)
  She informed me today 05/09/12 to she has been out of work since Thanksgiving. I had thought she was absence only for specific emergency or scheduled medical assessments. I reviewed the medical records for encounters. Office visits were: 04/05/12; 04/07/12; 04/17/12; 05/04/12; 05/09/12. Extensive lab studies including  Evaluations for rarer conditions were completed at the time of these visits & between the specific appointments.  Additionally she was in the emergency room 04/06/12; 04/23/12; and 04/24/12. Labs and imaging were performed at these emergency room visits.  She was seen in consultation by Dr Katz,Cardiology 04/27/12. Through that office she had an echocardiogram and myocardial perfusion study.  Her hypertension, tachycardia, and headaches are clinically stable allowing return to work as of 05/10/12. I have asked her to request that Dr. Tomasa Rand and her therapist comment on neuropsychiatric components contributing to her work absences.  Documentation of these visits and evaluations can be provided if needed to cover the absences during this period.  Please return to work  05/10/12. You should be excused for appt with Dr Tomasa Rand 05/12/12 @ 9:30. Repeat BUN, creatinine , A1c, potassium that am before his appt. Code: 401.9, 276.8, 790.29

## 2012-05-11 ENCOUNTER — Ambulatory Visit: Payer: 59 | Admitting: Cardiology

## 2012-05-12 ENCOUNTER — Other Ambulatory Visit: Payer: 59

## 2012-05-12 ENCOUNTER — Institutional Professional Consult (permissible substitution): Payer: 59 | Admitting: Cardiovascular Disease

## 2012-05-12 DIAGNOSIS — I10 Essential (primary) hypertension: Secondary | ICD-10-CM

## 2012-05-12 DIAGNOSIS — E876 Hypokalemia: Secondary | ICD-10-CM

## 2012-05-12 DIAGNOSIS — R7309 Other abnormal glucose: Secondary | ICD-10-CM

## 2012-05-18 ENCOUNTER — Telehealth: Payer: Self-pay | Admitting: Internal Medicine

## 2012-05-18 DIAGNOSIS — M791 Myalgia, unspecified site: Secondary | ICD-10-CM

## 2012-05-18 NOTE — Telephone Encounter (Signed)
Attempted to return call to patient without success.  Identified voicemail left.

## 2012-05-18 NOTE — Telephone Encounter (Signed)
Left message on VM informing patient additional labs to be drawn to f/u on cramps.

## 2012-05-18 NOTE — Telephone Encounter (Signed)
I spoke with patient, x couple days patient with cramps in neck, fingers, feet, legs, and hands.   Per Dr.Hopper patient to take Cal-Mag, we need potassium level (left off of labs)-lab error, patient states the earliest she would be able to come in if potassium unable to be added to last lab draw will be next week. Patient states these cramps she had were almost unbearable and would like to know if there is other labs that should be checked  Dr.Hopper please advise

## 2012-05-18 NOTE — Telephone Encounter (Signed)
Myalgias; magnesium level, potassium level, calcium level, vitamin D level

## 2012-05-18 NOTE — Telephone Encounter (Signed)
I am not sure what we are supposed to do with these types of message but we have been getting a lot of them lately. Please advise    KP

## 2012-05-19 ENCOUNTER — Other Ambulatory Visit: Payer: 59

## 2012-05-19 ENCOUNTER — Telehealth: Payer: Self-pay | Admitting: Cardiology

## 2012-05-19 DIAGNOSIS — E785 Hyperlipidemia, unspecified: Secondary | ICD-10-CM

## 2012-05-19 LAB — POTASSIUM: Potassium: 4.9 mEq/L (ref 3.5–5.3)

## 2012-05-19 LAB — MAGNESIUM: Magnesium: 2 mg/dL (ref 1.5–2.5)

## 2012-05-19 LAB — CALCIUM: Calcium: 9.1 mg/dL (ref 8.4–10.5)

## 2012-05-19 NOTE — Telephone Encounter (Signed)
Pt rtn call re echo results

## 2012-05-19 NOTE — Telephone Encounter (Signed)
Pt notified of ECHO results

## 2012-05-19 NOTE — Telephone Encounter (Signed)
Patient came in today for labs 

## 2012-05-23 LAB — VITAMIN D 1,25 DIHYDROXY
Vitamin D 1, 25 (OH)2 Total: 29 pg/mL (ref 18–72)
Vitamin D2 1, 25 (OH)2: <8 pg/mL
Vitamin D3 1, 25 (OH)2: 29 pg/mL

## 2012-05-24 ENCOUNTER — Encounter (HOSPITAL_BASED_OUTPATIENT_CLINIC_OR_DEPARTMENT_OTHER): Payer: Self-pay | Admitting: Family Medicine

## 2012-05-24 ENCOUNTER — Emergency Department (HOSPITAL_BASED_OUTPATIENT_CLINIC_OR_DEPARTMENT_OTHER)
Admission: EM | Admit: 2012-05-24 | Discharge: 2012-05-24 | Disposition: A | Discharge status: Home / Self Care | Payer: 59 | Attending: Emergency Medicine | Admitting: Emergency Medicine

## 2012-05-24 DIAGNOSIS — R071 Chest pain on breathing: Secondary | ICD-10-CM | POA: Insufficient documentation

## 2012-05-24 DIAGNOSIS — Z79899 Other long term (current) drug therapy: Secondary | ICD-10-CM | POA: Insufficient documentation

## 2012-05-24 DIAGNOSIS — E039 Hypothyroidism, unspecified: Secondary | ICD-10-CM | POA: Insufficient documentation

## 2012-05-24 DIAGNOSIS — F411 Generalized anxiety disorder: Secondary | ICD-10-CM | POA: Insufficient documentation

## 2012-05-24 DIAGNOSIS — R0789 Other chest pain: Secondary | ICD-10-CM

## 2012-05-24 DIAGNOSIS — K589 Irritable bowel syndrome without diarrhea: Secondary | ICD-10-CM | POA: Insufficient documentation

## 2012-05-24 DIAGNOSIS — F3289 Other specified depressive episodes: Secondary | ICD-10-CM | POA: Insufficient documentation

## 2012-05-24 DIAGNOSIS — F329 Major depressive disorder, single episode, unspecified: Secondary | ICD-10-CM | POA: Insufficient documentation

## 2012-05-24 MED ORDER — IBUPROFEN 800 MG PO TABS: 800.0000 mg | ORAL_TABLET | Freq: Three times a day (TID) | ORAL | Status: DC
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
## 2012-05-24 NOTE — ED Notes (Addendum)
Pt sts she has a "mass" that hurts with palpation or "bra touching it" between the breasts x 1 day.

## 2012-05-31 ENCOUNTER — Encounter: Payer: Self-pay | Admitting: Internal Medicine

## 2012-06-16 ENCOUNTER — Encounter: Payer: Self-pay | Admitting: Cardiology

## 2012-06-16 ENCOUNTER — Ambulatory Visit: Payer: 59 | Admitting: Internal Medicine

## 2012-06-16 DIAGNOSIS — R9430 Abnormal result of cardiovascular function study, unspecified: Secondary | ICD-10-CM | POA: Insufficient documentation

## 2012-06-17 ENCOUNTER — Other Ambulatory Visit: Payer: Self-pay

## 2012-06-19 ENCOUNTER — Encounter: Payer: Self-pay | Admitting: Cardiology

## 2012-06-19 ENCOUNTER — Ambulatory Visit (INDEPENDENT_AMBULATORY_CARE_PROVIDER_SITE_OTHER): Payer: 59 | Admitting: Cardiology

## 2012-06-19 ENCOUNTER — Encounter: Payer: Self-pay | Admitting: Family Medicine

## 2012-06-19 ENCOUNTER — Ambulatory Visit (INDEPENDENT_AMBULATORY_CARE_PROVIDER_SITE_OTHER): Payer: 59 | Admitting: Family Medicine

## 2012-06-19 ENCOUNTER — Encounter: Payer: Self-pay | Admitting: Lab

## 2012-06-19 VITALS — BP 162/90 | HR 93 | Ht 64.0 in | Wt 184.0 lb

## 2012-06-19 VITALS — BP 170/90 | HR 74 | Temp 98.2°F | Ht 66.0 in | Wt 184.8 lb

## 2012-06-19 DIAGNOSIS — M949 Disorder of cartilage, unspecified: Secondary | ICD-10-CM

## 2012-06-19 DIAGNOSIS — M899 Disorder of bone, unspecified: Secondary | ICD-10-CM

## 2012-06-19 DIAGNOSIS — R079 Chest pain, unspecified: Secondary | ICD-10-CM

## 2012-06-19 DIAGNOSIS — R0789 Other chest pain: Secondary | ICD-10-CM | POA: Insufficient documentation

## 2012-06-19 DIAGNOSIS — I10 Essential (primary) hypertension: Secondary | ICD-10-CM

## 2012-06-19 MED ORDER — METOPROLOL TARTRATE 25 MG PO TABS: 50.0000 mg | ORAL_TABLET | Freq: Two times a day (BID) | ORAL | Status: DC

## 2012-06-19 NOTE — Progress Notes (Signed)
  Subjective:    Patient ID: Sarah Salazar, female    DOB: [birth year], 57 y.o.   MRN: [id number]  HPI Knot between breasts- pt reports that w/ recent weight loss she now has a lump.  Lump is painful to touch.  First noticed in Jan.  No redness or drainage.  Has hx of breast lumps that have been removed on L.  Went to ER and was told she had 'irritated sternum' and was told to take 800mg  ibuprofen.  Pt was very upset by this.  Has had 2 CXRs and CT scan that were unrevealing.  Pt is very upset w/ multiple medical providers.  HTN- chronic problem, on Cozaar, metoprolol, Spironolactone.  + HAs, CP- had complete cards evaluation.  No SOB, visual changes, edema.  Pt admits to being upset today and reports she's in pain.   Review of Systems For ROS see HPI     Objective:   Physical Exam  Vitals reviewed. Constitutional: She is oriented to person, place, and time. She appears well-developed and well-nourished. No distress.  HENT:  Head: Normocephalic and atraumatic.  Eyes: Conjunctivae and EOM are normal. Pupils are equal, round, and reactive to light.  Neck: Normal range of motion. Neck supple. No thyromegaly present.  Cardiovascular: Normal rate, regular rhythm, normal heart sounds and intact distal pulses.   No murmur heard. Pulmonary/Chest: Effort normal and breath sounds normal. No respiratory distress. She has no wheezes. She has no rales. She exhibits tenderness (over xyphoid process- no palpable lump on exam).  Abdominal: Soft. She exhibits no distension. There is no tenderness.  Musculoskeletal: She exhibits no edema.  Lymphadenopathy:    She has no cervical adenopathy.  Neurological: She is alert and oriented to person, place, and time.  Skin: Skin is warm and dry.  Psychiatric:  Pt very irritable.  Keeps talking about medical providers she's unhappy with and how no one will help her          Assessment & Plan:

## 2012-06-19 NOTE — Patient Instructions (Addendum)
Follow up w/ Dr Alwyn Ren in 2 weeks to recheck BP Increase your Metoprolol to 2 tabs twice daily to improve your blood pressure We'll call you with your ultrasound appt to assess your xyphoid process Continue the ibuprofen 800mg  3x/day- take w/ food ICE or heat, whichever is more comfortable Ultram as needed Hang in there!

## 2012-06-19 NOTE — Patient Instructions (Addendum)
Your physician wants you to follow-up in: only if needed.  No follow up necessary.

## 2012-06-19 NOTE — Progress Notes (Signed)
Patient ID: Sarah Salazar, female   DOB: 1956/02/20, 57 y.o.   MRN: [id number]   HPI  Patient is seen today for cardiology followup. I had seen her on April 27, 2012 for the evaluation of chest pain. Two-dimensional echo was done and nuclear exercise testing was done to be sure that her overall cardiac status was stable. These tests were normal. There was no definite scar or ischemia. Wall motion was normal. No further cardiac workup is needed. Since seeing her she did have one visit to the emergency room. She had some chest discomfort that was thought to be chest wall. She is now here for followup.  Allergies  Allergen Reactions  . Amoxicillin Rash       . Benzonatate Rash    Tessalon Perles and amoxicillin were being taken simultaneously when she developed a rash  . Fentanyl Rash    given with Versed  . Midazolam Rash    given with Fentanyl  . Oxycodone-Aspirin Rash  . Sulfonamide Derivatives Rash    rash  . Hydrocodone Hives and Nausea And Vomiting  . Nalbuphine Nausea And Vomiting  . Oxycodone-Acetaminophen Hives and Nausea And Vomiting  . Venlafaxine Other (See Comments)    Patient says makes her crazy  . Verapamil     Caused "pounding " in chest    Current Outpatient Prescriptions  Medication Sig Dispense Refill  . ALPRAZolam (XANAX) 0.5 MG tablet Take 0.25-0.5 mg by mouth 2 (two) times daily as needed. For anxiety      . buPROPion (WELLBUTRIN) 100 MG tablet Take 300 mg by mouth daily.       . cyclobenzaprine (FLEXERIL) 5 MG tablet 1-2 qhs prn  20 tablet  0  . dexlansoprazole (DEXILANT) 60 MG capsule Take 1 capsule (60 mg total) by mouth daily.  30 capsule  5  . hyoscyamine (LEVSIN SL) 0.125 MG SL tablet Place 0.125 mg under the tongue every 4 (four) hours as needed.      Marland Kitchen ibuprofen (ADVIL,MOTRIN) 800 MG tablet Take 800 mg by mouth every 8 (eight) hours as needed. For pain       . ibuprofen (ADVIL,MOTRIN) 800 MG tablet Take 1 tablet (800 mg total) by mouth 3 (three)  times daily.  21 tablet  0  . ketoconazole (NIZORAL) 2 % cream Apply topically 2 (two) times daily. Apply bid & blow dry  15 g  1  . lamoTRIgine (LAMICTAL) 150 MG tablet Take 300 mg by mouth at bedtime. For bi polar disorder      . levothyroxine (SYNTHROID, LEVOTHROID) 100 MCG tablet Take 1 tablet (100 mcg total) by mouth daily.  90 tablet  0  . losartan (COZAAR) 100 MG tablet Take 1 tablet (100 mg total) by mouth daily.  30 tablet  0  . Lurasidone HCl (LATUDA) 60 MG TABS Take by mouth.      . metoprolol tartrate (LOPRESSOR) 25 MG tablet 1& 1/2 bid if BP averages > 140/90  60 tablet  0  . potassium chloride SA (K-DUR,KLOR-CON) 20 MEQ tablet Take by mouth daily. 2 by mouth daily      . prochlorperazine (COMPAZINE) 10 MG tablet Take 1 tablet (10 mg total) by mouth 2 (two) times daily as needed. Take with Benadryl 25mg .  20 tablet  0  . spironolactone (ALDACTONE) 25 MG tablet Take 1 tablet (25 mg total) by mouth daily.  60 tablet  2  . topiramate (TOPAMAX) 25 MG tablet Take 1 tablet (25 mg total)  by mouth 2 (two) times daily.  60 tablet  1  . traMADol (ULTRAM) 50 MG tablet 1 by mouth every 6 hours as needed for cough  30 tablet  1   No current facility-administered medications for this visit.    History   Social History  . Marital Status: Divorced    Spouse Name: N/A    Number of Children: N/A  . Years of Education: N/A   Occupational History  . CUSTOMER SERVICE    Social History Main Topics  . Smoking status: Never Smoker   . Smokeless tobacco: Never Used  . Alcohol Use: Yes     Comment: occ  . Drug Use: No  . Sexually Active: Not on file   Other Topics Concern  . Not on file   Social History Narrative   DAILY CAFFEINE USE--1 CUP   COLONOSCOPY AND ENDOSCOPY NEG X2          Family History  Problem Relation Age of Onset  . Coronary artery disease Mother   . Stroke Mother     TIA  . Uterine cancer Mother     BREAST  . Lung cancer Father     LUNG  . Heart disease  Father   . Colon cancer Paternal Uncle     COLON; STOMACH  . COPD Father   . Thyroid disease Mother     goiter  . Lung cancer Maternal Grandfather   . Coronary artery disease Father   . Rheum arthritis Maternal Aunt   . Supraventricular tachycardia Daughter     S/P ablation    Past Medical History  Diagnosis Date  . IBS (irritable bowel syndrome)   . Anxiety   . Depression   . Hypothyroidism     hypothyroidism  . Migraine   . Chest pain     Emergency room April 23, 2012, troponin is normal, chest CT scan showed no pulmonary embolus  . Cough     Chronic cough  . Ejection fraction     EF 55-60%, echo, December, 2013, lipomatous hypertrophy of the atrial septum    Past Surgical History  Procedure Laterality Date  . Cholecystectomy    . Dilation and curettage of uterus      x3  . Knee surgery      x2  . Wrist surgery      x3  . Laparoscopy  06/03/1990    to evaluate dysfunctional menses & pelvic pain  . Breast lumpectomy      left  . Cystocele repair  01/13/09  . Upper gastrointestinal endoscopy  2002  . Colonoscopy  2002  . Mastectomy, partial      benign fibrocystic changes    Patient Active Problem List  Diagnosis  . B12 DEFICIENCY  . HYPERLIPIDEMIA NEC/NOS  . HYPOKALEMIA  . BIPOLAR DISORDER UNSPECIFIED  . IBS  . ASTERIXIS  . FASTING HYPERGLYCEMIA  . Hallucinations  . HEADACHE  . Chronic cough  . IBS (irritable bowel syndrome)  . Anxiety  . Depression  . Hypothyroidism  . Migraine  . Chest pain  . HTN (hypertension)  . Ejection fraction    ROS   Patient denies fever, chills, headache, sweats, rash, change in vision, change in hearing, chest pain, cough, nausea vomiting, urinary symptoms. All of the systems are reviewed and are negative area  PHYSICAL EXAM   The patient is stable. She is oriented to person time and place. Affect is normal. There is no jugulovenous distention. Lungs are  clear. Respiratory effort is nonlabored. Cardiac exam  reveals S1 and S2. There no clicks or significant murmurs. The abdomen is soft. There is no peripheral edema.  Filed Vitals:   06/19/12 0922  BP: 162/90  Pulse: 93  Height: 5\' 4"  (1.626 m)  Weight: 184 lb (83.462 kg)  SpO2: 98%     ASSESSMENT & PLAN

## 2012-06-19 NOTE — Assessment & Plan Note (Signed)
At this point she does not need any further workup for her chest pain. I will not be changing any of her medicines. I be happy to try to follow her in one year for cardiology followup if this is needed.

## 2012-06-20 ENCOUNTER — Encounter: Payer: Self-pay | Admitting: Internal Medicine

## 2012-06-20 ENCOUNTER — Ambulatory Visit (INDEPENDENT_AMBULATORY_CARE_PROVIDER_SITE_OTHER): Payer: 59 | Admitting: Internal Medicine

## 2012-06-20 VITALS — BP 128/86 | HR 80 | Ht 64.0 in | Wt 183.8 lb

## 2012-06-20 DIAGNOSIS — K589 Irritable bowel syndrome without diarrhea: Secondary | ICD-10-CM

## 2012-06-20 DIAGNOSIS — R1011 Right upper quadrant pain: Secondary | ICD-10-CM

## 2012-06-20 DIAGNOSIS — K219 Gastro-esophageal reflux disease without esophagitis: Secondary | ICD-10-CM

## 2012-06-20 DIAGNOSIS — R0789 Other chest pain: Secondary | ICD-10-CM

## 2012-06-20 DIAGNOSIS — R071 Chest pain on breathing: Secondary | ICD-10-CM

## 2012-06-20 DIAGNOSIS — Z9283 Personal history of failed moderate sedation: Secondary | ICD-10-CM

## 2012-06-20 DIAGNOSIS — Z1211 Encounter for screening for malignant neoplasm of colon: Secondary | ICD-10-CM

## 2012-06-20 DIAGNOSIS — R1310 Dysphagia, unspecified: Secondary | ICD-10-CM

## 2012-06-20 HISTORY — DX: Personal history of failed moderate sedation: Z92.83

## 2012-06-20 MED ORDER — HYOSCYAMINE SULFATE 0.125 MG SL SUBL: 0.1250 mg | SUBLINGUAL_TABLET | SUBLINGUAL | Status: DC | PRN

## 2012-06-20 MED ORDER — SOD PICOSULFATE-MAG OX-CIT ACD 10-3.5-12 MG-GM-GM PO PACK: 1.0000 | PACK | Freq: Once | ORAL | Status: DC

## 2012-06-20 NOTE — Patient Instructions (Addendum)
You have been scheduled for an endoscopy and colonoscopy with propofol. Please follow the written instructions given to you at your visit today. Please use the kit you have been given today. If you use inhalers (even only as needed) or a CPAP machine, please bring them with you on the day of your procedure.  We have sent the following medications to your pharmacy for you to pick up at your convenience: Generic Levsin  Thank you for choosing me and [facility].  Iva Boop, M.D., [facility]

## 2012-06-20 NOTE — Assessment & Plan Note (Signed)
New to provider, not well controlled.  Asymptomatic.  Will double metoprolol in hopes of better controlling BP and anxiety.  Pt to f/u w/ PCP to reassess BP in 2 weeks.

## 2012-06-20 NOTE — Assessment & Plan Note (Addendum)
New.  Agree that pt's pain is musculoskeletal based on exam today and her recent negative cardiac w/u along w/ normal chest CT.  Agree w/ NSAID use.  Pt has tramadol available for use.  Will get [place] of area to try and put pt at ease.  Pt not happy w/ explanation.

## 2012-06-20 NOTE — Progress Notes (Signed)
Subjective:    Patient ID: Sarah Salazar, female    DOB: 07/26/1955, 57 y.o.   MRN: [id number]  HPI This nice lady has a history of IBS and GERD problems. She has not been seen and a number of years. She comes in with complaints of chest pain which is thought to probably be chest wall pain, she just finished a cardiac evaluation. She also has intermittent right upper quadrant pain ever since she had her cholecystectomy that as severe and sharp and has been described as a spasm. Hyoscyamine may provide some relief. She also has solid food dysphagia with suprasternal sticking point and food moving slow, which she says is severe postprandial bloating and discomfort, and sometimes nocturnal reflux with aspiration and coughing. She does wait a long time between retiring supine at night after eating. She is on Dexilant 60 mg daily since the spring of last year, she thinks that helped some but she still has these problems. Her weight has fluctuated. She attributes this to psychiatric medications. She is struggling with elevated blood pressure and headaches lately. Her bowel habits are generally regular without bleeding or problems.  She has a history of extreme weight loss 10-12 years ago, she had an incomplete colonoscopy in 2001 because she could not get adequately sedated and she apparently had a rash either fentanyl or Versed. She was eventually seen at [facility] and had a colonoscopy with propofol around 2002 and that was okay and she regained weight, and she did not have IBD. In 2004 she had an EGD with dilation of esophageal stricture and had esophagitis and gastritis. There is some family history of colon cancer in an uncle, and she also has multiple relatives with prostate cancer breast cancer and uterine cancer it sounds like. She is very concerned about the possibility of having cancer.  Allergies  Allergen Reactions  . Amoxicillin Rash       . Benzonatate Rash    Tessalon Perles and  amoxicillin were being taken simultaneously when she developed a rash  . Fentanyl Rash    given with Versed  . Midazolam Rash    given with Fentanyl  . Oxycodone-Aspirin Rash  . Sulfonamide Derivatives Rash    rash  . Hydrocodone Hives and Nausea And Vomiting  . Nalbuphine Nausea And Vomiting  . Oxycodone-Acetaminophen Hives and Nausea And Vomiting  . Venlafaxine Other (See Comments)    Patient says makes her crazy  . Verapamil     Caused "pounding " in chest   Outpatient Prescriptions Prior to Visit  Medication Sig Dispense Refill  . ALPRAZolam (XANAX) 0.5 MG tablet Take 0.25-0.5 mg by mouth 2 (two) times daily as needed. For anxiety      . buPROPion (WELLBUTRIN) 100 MG tablet Take 300 mg by mouth daily.       . cyclobenzaprine (FLEXERIL) 5 MG tablet 1-2 qhs prn  20 tablet  0  . dexlansoprazole (DEXILANT) 60 MG capsule Take 1 capsule (60 mg total) by mouth daily.  30 capsule  5  . ibuprofen (ADVIL,MOTRIN) 800 MG tablet Take 1 tablet (800 mg total) by mouth 3 (three) times daily.  21 tablet  0  . ketoconazole (NIZORAL) 2 % cream Apply topically 2 (two) times daily. Apply bid & blow dry  15 g  1  . lamoTRIgine (LAMICTAL) 150 MG tablet Take 300 mg by mouth at bedtime. For bi polar disorder      . levothyroxine (SYNTHROID, LEVOTHROID) 100 MCG tablet Take  1 tablet (100 mcg total) by mouth daily.  90 tablet  0  . losartan (COZAAR) 100 MG tablet Take 1 tablet (100 mg total) by mouth daily.  30 tablet  0  . Lurasidone HCl (LATUDA) 60 MG TABS Take by mouth.      . metoprolol tartrate (LOPRESSOR) 25 MG tablet Take 2 tablets (50 mg total) by mouth 2 (two) times daily.  60 tablet  0  . potassium chloride SA (K-DUR,KLOR-CON) 20 MEQ tablet Take by mouth daily. 2 by mouth daily      . prochlorperazine (COMPAZINE) 10 MG tablet Take 1 tablet (10 mg total) by mouth 2 (two) times daily as needed. Take with Benadryl 25mg .  20 tablet  0  . spironolactone (ALDACTONE) 25 MG tablet Take 1 tablet (25 mg  total) by mouth daily.  60 tablet  2  . topiramate (TOPAMAX) 25 MG tablet Take 1 tablet (25 mg total) by mouth 2 (two) times daily.  60 tablet  1  . traMADol (ULTRAM) 50 MG tablet 1 by mouth every 6 hours as needed for cough  30 tablet  1  . hyoscyamine (LEVSIN SL) 0.125 MG SL tablet Place 0.125 mg under the tongue every 4 (four) hours as needed.       No facility-administered medications prior to visit.   Past Medical History  Diagnosis Date  . IBS (irritable bowel syndrome)   . Anxiety   . Depression   . Hypothyroidism     hypothyroidism  . Migraine   . Chest pain     Emergency room April 23, 2012, troponin is normal, chest CT scan showed no pulmonary embolus  . Cough     Chronic cough  . Ejection fraction     EF 55-60%, echo, December, 2013, lipomatous hypertrophy of the atrial septum  . GERD (gastroesophageal reflux disease)   . Personal history of failed moderate sedation 06/20/2012   Past Surgical History  Procedure Laterality Date  . Cholecystectomy    . Dilation and curettage of uterus      x3  . Knee surgery      x2  . Wrist surgery      x3  . Laparoscopy  06/03/1990    to evaluate dysfunctional menses & pelvic pain  . Breast lumpectomy      left  . Cystocele repair  01/13/09  . Upper gastrointestinal endoscopy  2004    esophageal dilation  . Colonoscopy  2002  . Mastectomy, partial      benign fibrocystic changes  . Elbow surgery  2005    left   History   Social History  . Marital Status: Divorced    Spouse Name: N/A    Number of Children: 1 daughter  .     Occupational History  . CUSTOMER SERVICE    Social History Main Topics  . Smoking status: Never Smoker   . Smokeless tobacco: Never Used  . Alcohol Use: Yes     Comment: occ  . Drug Use: No              Social History Narrative   Divorced, has boyfriend   1 daughter   Patient accounts at [employer]   1 caffeinated beverage/day            Family History  Problem Relation Age of  Onset  . Coronary artery disease Mother   . Stroke Mother     TIA  . Uterine cancer Mother  BREAST  . Lung cancer Father     LUNG  . Heart disease Father   . Colon cancer Paternal Uncle     COLON; STOMACH  . COPD Father   . Thyroid disease Mother     goiter  . Lung cancer Maternal Grandfather   . Coronary artery disease Father   . Rheum arthritis Maternal Aunt   . Supraventricular tachycardia Daughter     S/P ablation       Review of Systems This is positive for sinus problems headaches, depression and fatigue anxiety, excessive thirst and urination and urinary leakage. She thinks her hemoglobin A1c is elevated and intends to bring those labs to me. All other review of systems are negative or as per history of present illness at this point.    Objective:   Physical Exam General:  Well-developed, well-nourished and in no acute distress Eyes:  anicteric. ENT:   Mouth and posterior pharynx free of lesions. Teeth in fair repair Neck:   supple w/o thyromegaly or mass.  Lungs: Clear to auscultation bilaterally. Heart:  S1S2, no rubs, murmurs, gallops. Chest wall: Tender lower sternum and xiphoid, other ribs not tender Abdomen:  soft, non-tender, no hepatosplenomegaly, hernia, or mass and BS+.  Rectal: deferred Lymph:  no cervical or supraclavicular adenopathy. Extremities:   no edema Skin   no rash. Neuro:  A&O x 3.  Psych:  appropriate mood and  Affect.   Data Reviewed: Lab Results  Component Value Date   WBC 7.9 04/23/2012   HGB 14.4 04/23/2012   HCT 41.3 04/23/2012   MCV 91.0 04/23/2012   PLT 239 04/23/2012      Chemistry      Component Value Date/Time   NA 138 04/27/2012 1010   K 4.9 05/19/2012 1125   CL 104 04/27/2012 1010   CO2 27 04/27/2012 1010   BUN 9 04/27/2012 1010   BUN 9 04/27/2012 1010   CREATININE 1.0 04/27/2012 1010   CREATININE 1.0 04/27/2012 1010      Component Value Date/Time   CALCIUM 9.1 05/19/2012 1125   ALKPHOS 91 05/14/2010 1451    AST 19 05/14/2010 1451   ALT 24 05/14/2010 1451   BILITOT 0.6 05/14/2010 1451         Assessment & Plan:  Dysphagia, unspecified   Esophageal reflux - Plan: Ambulatory referral to Gastroenterology - EGD and dilation  ? If chronic cough associated, may need gastric emptying study given post-prandial bloating.   The risks and benefits as well as alternatives of endoscopic procedure(s) have been discussed and reviewed. All questions answered. The patient agrees to proceed.  RUQ pain - Plan: Ambulatory referral to Gastroenterology - EGD, refill hyoscyamine  Chest wall pain - ? Fibromyalgia and IBS  Special screening for malignant neoplasms, colon - Plan: Ambulatory referral to Gastroenterology - colonoscopy The risks and benefits as well as alternatives of endoscopic procedure(s) have been discussed and reviewed. All questions answered. The patient agrees to proceed.  IBS (irritable bowel syndrome) - most sxs upper now (GERD, dyspepsia)  Personal history of failed moderate sedation  I appreciate the opportunity to care for this patient.  WJ:[id number] Alwyn Ren, MD

## 2012-06-21 ENCOUNTER — Encounter: Payer: Self-pay | Admitting: Internal Medicine

## 2012-06-21 ENCOUNTER — Ambulatory Visit (AMBULATORY_SURGERY_CENTER): Payer: 59 | Admitting: Internal Medicine

## 2012-06-21 VITALS — BP 152/97 | HR 74 | Temp 98.1°F | Resp 32 | Ht 64.0 in | Wt 183.0 lb

## 2012-06-21 DIAGNOSIS — Z1211 Encounter for screening for malignant neoplasm of colon: Secondary | ICD-10-CM

## 2012-06-21 DIAGNOSIS — K449 Diaphragmatic hernia without obstruction or gangrene: Secondary | ICD-10-CM

## 2012-06-21 DIAGNOSIS — K259 Gastric ulcer, unspecified as acute or chronic, without hemorrhage or perforation: Secondary | ICD-10-CM

## 2012-06-21 DIAGNOSIS — R1011 Right upper quadrant pain: Secondary | ICD-10-CM

## 2012-06-21 DIAGNOSIS — R1310 Dysphagia, unspecified: Secondary | ICD-10-CM

## 2012-06-21 DIAGNOSIS — D126 Benign neoplasm of colon, unspecified: Secondary | ICD-10-CM

## 2012-06-21 DIAGNOSIS — K219 Gastro-esophageal reflux disease without esophagitis: Secondary | ICD-10-CM

## 2012-06-21 DIAGNOSIS — K5730 Diverticulosis of large intestine without perforation or abscess without bleeding: Secondary | ICD-10-CM

## 2012-06-21 MED ORDER — SODIUM CHLORIDE 0.9 % IV SOLN: 500.0000 mL | INTRAVENOUS | Status: DC

## 2012-06-21 NOTE — Op Note (Signed)
[facility] [address] [place], [address]   COLONOSCOPY PROCEDURE REPORT  PATIENT: Sarah Salazar, Sarah Salazar  MR#: [id number] BIRTHDATE: 04-01-1956 , 56  yrs. old GENDER: Female ENDOSCOPIST: Iva Boop, MD, [facility] PROCEDURE DATE:  06/21/2012 PROCEDURE:   Colonoscopy with snare polypectomy ASA CLASS:   Class II INDICATIONS:average risk screening. MEDICATIONS: There was residual sedation effect present from prior procedure, propofol (Diprivan) 150mg  IV, MAC sedation, administered by CRNA, and These medications were titrated to patient response per physician's verbal order  DESCRIPTION OF PROCEDURE:   After the risks benefits and alternatives of the procedure were thoroughly explained, informed consent was obtained.  A digital rectal exam revealed no abnormalities of the rectum.   The LB [id number]-[id number]  endoscope was introduced through the anus and advanced to the cecum, which was identified by both the appendix and ileocecal valve. No adverse events experienced.   The quality of the prep was Prepopik excellent  The instrument was then slowly withdrawn as the colon was fully examined.      COLON FINDINGS: A smooth and polypoid shaped sessile polyp measuring 3 mm in size was found in the sigmoid colon.  A polypectomy was performed with a cold snare.  The resection was complete and the polyp tissue was completely retrieved.   Moderate diverticulosis was noted in the sigmoid colon.   The colon mucosa was otherwise normal.   A right colon retroflexion was performed.  Retroflexed views revealed no abnormalities. The time to cecum=2 minutes 19 seconds.  Withdrawal time=7 minutes 05 seconds.  The scope was withdrawn and the procedure completed. COMPLICATIONS: There were no complications.  ENDOSCOPIC IMPRESSION: 1.   Sessile polyp measuring 3 mm in size was found in the sigmoid colon; polypectomy was performed with a cold snare 2.   Moderate diverticulosis was  noted in the sigmoid colon 3.   The colon mucosa was otherwise normal - excellent prep  RECOMMENDATIONS: Timing of repeat colonoscopy will be determined by pathology findings.   eSigned:  Iva Boop, MD, [facility] 06/21/2012 3:15 PM  cc: Pecola Lawless, MD and The Patient

## 2012-06-21 NOTE — Patient Instructions (Addendum)
There upper endoscopy exam showed a small hiatal hernia, inflammation where the esophagus and stomach meet, and a small erosion (tiny ulcer) in the stomach. Tiny ulcer probably from ibuprofen.  I stretched or dilated the esophagus to help the swallowing.  The colonoscopy showed a small polyp (removed0 and diverticulosis (unlikely to be a problem.  My office will arrange for you to have a gastric emptying scan to see if your stomach is emptying properly. It may not be and that could be causing some or all of your problems. I will contact you with those results when they are in.  I will let you know pathology results and when to have another routine colonoscopy by mail.  Thank you for choosing me and [facility].  Iva Boop, MD, FACGYOU HAD AN ENDOSCOPIC PROCEDURE TODAY AT THE [facility]: Refer to the procedure report that was given to you for any specific questions about what was found during the examination.  If the procedure report does not answer your questions, please call your gastroenterologist to clarify.  If you requested that your care partner not be given the details of your procedure findings, then the procedure report has been included in a sealed envelope for you to review at your convenience later.  YOU SHOULD EXPECT: Some feelings of bloating in the abdomen. Passage of more gas than usual.  Walking can help get rid of the air that was put into your GI tract during the procedure and reduce the bloating. If you had a lower endoscopy (such as a colonoscopy or flexible sigmoidoscopy) you may notice spotting of blood in your stool or on the toilet paper. If you underwent a bowel prep for your procedure, then you may not have a normal bowel movement for a few days.  DIET: Your first meal following the procedure should be a light meal and then it is ok to progress to your normal diet.  A half-sandwich or bowl of soup is an example of a good first meal.  Heavy  or fried foods are harder to digest and may make you feel nauseous or bloated.  Likewise meals heavy in dairy and vegetables can cause extra gas to form and this can also increase the bloating.  Drink plenty of fluids but you should avoid alcoholic beverages for 24 hours.  ACTIVITY: Your care partner should take you home directly after the procedure.  You should plan to take it easy, moving slowly for the rest of the day.  You can resume normal activity the day after the procedure however you should NOT DRIVE or use heavy machinery for 24 hours (because of the sedation medicines used during the test).    SYMPTOMS TO REPORT IMMEDIATELY: A gastroenterologist can be reached at any hour.  During normal business hours, 8:30 AM to 5:00 PM Monday through Friday, call [phone].  After hours and on weekends, please call the GI answering service at [phone] who will take a message and have the physician on call contact you.   Following lower endoscopy (colonoscopy or flexible sigmoidoscopy):  Excessive amounts of blood in the stool  Significant tenderness or worsening of abdominal pains  Swelling of the abdomen that is new, acute  Fever of 100F or higher  Following upper endoscopy (EGD)  Vomiting of blood or coffee ground material  New chest pain or pain under the shoulder blades  Painful or persistently difficult swallowing  New shortness of breath  Fever of 100F or higher  Black, tarry-looking stools  FOLLOW UP: If any biopsies were taken you will be contacted by phone or by letter within the next 1-3 weeks.  Call your gastroenterologist if you have not heard about the biopsies in 3 weeks.  Our staff will call the home number listed on your records the next business day following your procedure to check on you and address any questions or concerns that you may have at that time regarding the information given to you following your procedure. This is a courtesy call and so if there is no  answer at the home number and we have not heard from you through the emergency physician on call, we will assume that you have returned to your regular daily activities without incident.  SIGNATURES/CONFIDENTIALITY: You and/or your care partner have signed paperwork which will be entered into your electronic medical record.  These signatures attest to the fact that that the information above on your After Visit Summary has been reviewed and is understood.  Full responsibility of the confidentiality of this discharge information lies with you and/or your care-partner.  Hiatal hernia, esophagitis, post dilation diet (with verbal instructions), polyp and diverticulosis information given.
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
## 2012-06-21 NOTE — Op Note (Signed)
[facility] [address] [place], [address]   ENDOSCOPY PROCEDURE REPORT  PATIENT: Sarah Salazar, Sarah Salazar  MR#: [id number] BIRTHDATE: 06/12/1955 , 56  yrs. old GENDER: Female ENDOSCOPIST: Iva Boop, MD, [facility] REFERRED BY: PROCEDURE DATE:  06/21/2012 PROCEDURE:  EGD, diagnostic and Maloney dilation of esophagus ASA CLASS:     Class II INDICATIONS:  Dysphagia.   abdominal pain in the upper right quadrant.   Chest pain. MEDICATIONS: propofol (Diprivan) 200mg  IV, MAC sedation, administered by CRNA, and These medications were titrated to patient response per physician's verbal order TOPICAL ANESTHETIC: Cetacaine Spray  DESCRIPTION OF PROCEDURE: After the risks benefits and alternatives of the procedure were thoroughly explained, informed consent was obtained.  The LB GIF-H180 [id number] endoscope was introduced through the mouth and advanced to the second portion of the duodenum. Without limitations.  The instrument was slowly withdrawn as the mucosa was fully examined.        ESOPHAGUS: Mildly severe reflux esophagitis was found at the gastroesophageal junction - small erosion.   A 2 cm hiatal hernia was noted.   Z-line at 40 cm.  STOMACH: Small erosion 2mm max, was found on the posterior wall of the gastric antrum.  The remainder of the upper endoscopy exam was otherwise normal. Retroflexed views revealed a hiatal hernia.     The scope was then withdrawn from the patient, a 69 [place] Maloney dilator passed without difficulty or heme seen, and the procedure completed.  COMPLICATIONS: There were no complications. ENDOSCOPIC IMPRESSION: 1.   Esophagitis consistent with reflux esophagitis at the gastroesophageal junction 2.   2 cm hiatal hernia 3.   Erosion on the posterior wall of the gastric antrum 4.   The remainder of the upper endoscopy exam was otherwise normal - dilation w/ 54 Fr Maloney performed to treat dysphagia  RECOMMENDATIONS: 1.  Clear  liquids until 4PM  , then soft foods rest of day.  Resume prior diet tomorrow. 2.  My office will arrange for you to have a Gastric Emptying Scan performed.  This is a radiology test that gives an idea of how well your stomach functions. 3.   Colonoscopy next   eSigned:  Iva Boop, MD, [facility] 06/21/2012 3:11 PM   QI:[id number] Minerva Ends, MD and The Patient

## 2012-06-21 NOTE — Progress Notes (Signed)
Called to room to assist during endoscopic procedure.  Patient ID and intended procedure confirmed with present staff. Received instructions for my participation in the procedure from the performing physician.  

## 2012-06-22 ENCOUNTER — Telehealth: Payer: Self-pay | Admitting: *Deleted

## 2012-06-22 NOTE — Telephone Encounter (Signed)
Left message on number given in admitting yesterday to call back if problems or concerns. ewm

## 2012-06-23 ENCOUNTER — Encounter: Payer: Self-pay | Admitting: Family Medicine

## 2012-06-23 ENCOUNTER — Other Ambulatory Visit: Payer: Self-pay

## 2012-06-23 DIAGNOSIS — R079 Chest pain, unspecified: Secondary | ICD-10-CM

## 2012-06-23 DIAGNOSIS — R1011 Right upper quadrant pain: Secondary | ICD-10-CM

## 2012-06-23 NOTE — Progress Notes (Signed)
Per endoscopy report 06/21/12 she is to be scheduled for 07/07/12 0800 at [facility].  No stomach meds after midnight and be NPO after midnight.  I have left a message for the patient to call to discuss

## 2012-06-26 ENCOUNTER — Telehealth: Payer: Self-pay | Admitting: Internal Medicine

## 2012-06-26 NOTE — Progress Notes (Signed)
Patient advised of appt date and time 

## 2012-06-26 NOTE — Telephone Encounter (Signed)
Patient advised.

## 2012-06-26 NOTE — Telephone Encounter (Signed)
Patient is having some cramping after her colonoscopy sounds like gas pains.  She is having bowel movements, but is not passing gas.  She reports that hyoscyamine makes her too sleepy to take during the day.  She notes that she drinks a lot of green tea that is sweetened with fructose.  I have asked her to try switching to water and room temp today, take hyoscyamine when she is able.  She will call me back if her symptoms do not improve tomorrow.  Dr. Leone Payor do you have any additional recommendations?

## 2012-06-26 NOTE — Telephone Encounter (Signed)
If she can halve the hyoscyamine that may help also

## 2012-06-27 ENCOUNTER — Ambulatory Visit (HOSPITAL_BASED_OUTPATIENT_CLINIC_OR_DEPARTMENT_OTHER)
Admission: RE | Admit: 2012-06-27 | Discharge: 2012-06-27 | Disposition: A | Discharge status: Home / Self Care | Payer: 59 | Source: Ambulatory Visit | Attending: Family Medicine | Admitting: Family Medicine

## 2012-06-27 DIAGNOSIS — R079 Chest pain, unspecified: Secondary | ICD-10-CM | POA: Insufficient documentation

## 2012-06-27 DIAGNOSIS — R0789 Other chest pain: Secondary | ICD-10-CM

## 2012-06-28 ENCOUNTER — Encounter: Payer: Self-pay | Admitting: Internal Medicine

## 2012-06-28 DIAGNOSIS — Z860100 Personal history of colon polyps, unspecified: Secondary | ICD-10-CM

## 2012-06-28 HISTORY — DX: Personal history of colonic polyps: Z86.010

## 2012-06-28 HISTORY — DX: Personal history of colon polyps, unspecified: Z86.0100

## 2012-06-28 NOTE — Progress Notes (Signed)
Quick Note:  3 mm adenoma Repeat colonoscopy about 06/2017 ______

## 2012-06-29 ENCOUNTER — Other Ambulatory Visit: Payer: Self-pay | Admitting: Obstetrics and Gynecology

## 2012-06-29 DIAGNOSIS — Z1231 Encounter for screening mammogram for malignant neoplasm of breast: Secondary | ICD-10-CM

## 2012-07-06 ENCOUNTER — Telehealth: Payer: Self-pay | Admitting: Internal Medicine

## 2012-07-06 NOTE — Telephone Encounter (Signed)
GES cancelled at the patient's request.  I have left her a voicemail asking her to call back when she is ready to reschedule.

## 2012-07-07 ENCOUNTER — Encounter (HOSPITAL_COMMUNITY): Payer: 59

## 2012-07-26 ENCOUNTER — Ambulatory Visit: Payer: 59

## 2012-08-10 ENCOUNTER — Emergency Department (HOSPITAL_COMMUNITY)
Admission: EM | Admit: 2012-08-10 | Discharge: 2012-08-10 | Disposition: A | Discharge status: Home / Self Care | Payer: 59 | Attending: Emergency Medicine | Admitting: Emergency Medicine

## 2012-08-10 ENCOUNTER — Encounter (HOSPITAL_COMMUNITY): Payer: Self-pay | Admitting: Emergency Medicine

## 2012-08-10 DIAGNOSIS — Z79899 Other long term (current) drug therapy: Secondary | ICD-10-CM | POA: Insufficient documentation

## 2012-08-10 DIAGNOSIS — K219 Gastro-esophageal reflux disease without esophagitis: Secondary | ICD-10-CM | POA: Insufficient documentation

## 2012-08-10 DIAGNOSIS — S199XXA Unspecified injury of neck, initial encounter: Secondary | ICD-10-CM | POA: Insufficient documentation

## 2012-08-10 DIAGNOSIS — E039 Hypothyroidism, unspecified: Secondary | ICD-10-CM | POA: Insufficient documentation

## 2012-08-10 DIAGNOSIS — Z860100 Personal history of colon polyps, unspecified: Secondary | ICD-10-CM | POA: Insufficient documentation

## 2012-08-10 DIAGNOSIS — F411 Generalized anxiety disorder: Secondary | ICD-10-CM | POA: Insufficient documentation

## 2012-08-10 DIAGNOSIS — Z8719 Personal history of other diseases of the digestive system: Secondary | ICD-10-CM | POA: Insufficient documentation

## 2012-08-10 DIAGNOSIS — S0993XA Unspecified injury of face, initial encounter: Secondary | ICD-10-CM | POA: Insufficient documentation

## 2012-08-10 DIAGNOSIS — F329 Major depressive disorder, single episode, unspecified: Secondary | ICD-10-CM | POA: Insufficient documentation

## 2012-08-10 DIAGNOSIS — Y92410 Unspecified street and highway as the place of occurrence of the external cause: Secondary | ICD-10-CM | POA: Insufficient documentation

## 2012-08-10 DIAGNOSIS — G43909 Migraine, unspecified, not intractable, without status migrainosus: Secondary | ICD-10-CM | POA: Insufficient documentation

## 2012-08-10 DIAGNOSIS — Y9389 Activity, other specified: Secondary | ICD-10-CM | POA: Insufficient documentation

## 2012-08-10 DIAGNOSIS — F3289 Other specified depressive episodes: Secondary | ICD-10-CM | POA: Insufficient documentation

## 2012-08-10 DIAGNOSIS — IMO0002 Reserved for concepts with insufficient information to code with codable children: Secondary | ICD-10-CM | POA: Insufficient documentation

## 2012-08-10 DIAGNOSIS — M7918 Myalgia, other site: Secondary | ICD-10-CM

## 2012-08-10 MED ORDER — TRAMADOL HCL 50 MG PO TABS: 50.0000 mg | ORAL_TABLET | Freq: Four times a day (QID) | ORAL | Status: DC | PRN

## 2012-08-10 MED ORDER — CYCLOBENZAPRINE HCL 10 MG PO TABS: 10.0000 mg | ORAL_TABLET | Freq: Two times a day (BID) | ORAL | Status: DC | PRN

## 2012-08-10 MED ORDER — IBUPROFEN 800 MG PO TABS: 800.0000 mg | ORAL_TABLET | Freq: Three times a day (TID) | ORAL | Status: DC
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
## 2012-08-10 NOTE — ED Provider Notes (Signed)
Medical screening examination/treatment/procedure(s) were performed by non-physician practitioner and as supervising physician I was immediately available for consultation/collaboration.  Kriya Westra L Cloris Flippo, MD 08/10/12 1649 
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
## 2012-08-16 ENCOUNTER — Encounter: Payer: Self-pay | Admitting: Lab

## 2012-08-16 ENCOUNTER — Ambulatory Visit: Payer: 59 | Admitting: Family Medicine

## 2012-08-17 ENCOUNTER — Encounter: Payer: Self-pay | Admitting: Lab

## 2012-08-17 ENCOUNTER — Encounter: Payer: Self-pay | Admitting: Family Medicine

## 2012-08-17 ENCOUNTER — Ambulatory Visit (INDEPENDENT_AMBULATORY_CARE_PROVIDER_SITE_OTHER): Payer: 59 | Admitting: Family Medicine

## 2012-08-17 ENCOUNTER — Ambulatory Visit (HOSPITAL_BASED_OUTPATIENT_CLINIC_OR_DEPARTMENT_OTHER)
Admission: RE | Admit: 2012-08-17 | Discharge: 2012-08-17 | Disposition: A | Discharge status: Home / Self Care | Payer: 59 | Source: Ambulatory Visit | Attending: Family Medicine | Admitting: Family Medicine

## 2012-08-17 VITALS — BP 142/90 | HR 74 | Temp 98.5°F | Wt 190.2 lb

## 2012-08-17 DIAGNOSIS — M545 Low back pain, unspecified: Secondary | ICD-10-CM | POA: Insufficient documentation

## 2012-08-17 DIAGNOSIS — M79605 Pain in left leg: Secondary | ICD-10-CM

## 2012-08-17 MED ORDER — TRAMADOL HCL 50 MG PO TABS: 50.0000 mg | ORAL_TABLET | Freq: Four times a day (QID) | ORAL | Status: DC | PRN

## 2012-08-17 MED ORDER — IBUPROFEN 800 MG PO TABS: 800.0000 mg | ORAL_TABLET | Freq: Three times a day (TID) | ORAL | Status: DC

## 2012-08-17 MED ORDER — CYCLOBENZAPRINE HCL 10 MG PO TABS: 10.0000 mg | ORAL_TABLET | Freq: Two times a day (BID) | ORAL | Status: DC | PRN

## 2012-08-17 NOTE — Patient Instructions (Addendum)
Back Pain, Adult Low back pain is very common. About 1 in 5 people have back pain.The cause of low back pain is rarely dangerous. The pain often gets better over time.About half of people with a sudden onset of back pain feel better in just 2 weeks. About 8 in 10 people feel better by 6 weeks.  CAUSES Some common causes of back pain include:  Strain of the muscles or ligaments supporting the spine.  Wear and tear (degeneration) of the spinal discs.  Arthritis.  Direct injury to the back. DIAGNOSIS Most of the time, the direct cause of low back pain is not known.However, back pain can be treated effectively even when the exact cause of the pain is unknown.Answering your caregiver's questions about your overall health and symptoms is one of the most accurate ways to make sure the cause of your pain is not dangerous. If your caregiver needs more information, he or she may order lab work or imaging tests (X-rays or MRIs).However, even if imaging tests show changes in your back, this usually does not require surgery. HOME CARE INSTRUCTIONS For many people, back pain returns.Since low back pain is rarely dangerous, it is often a condition that people can learn to manageon their own.   Remain active. It is stressful on the back to sit or stand in one place. Do not sit, drive, or stand in one place for more than 30 minutes at a time. Take short walks on level surfaces as soon as pain allows.Try to increase the length of time you walk each day.  Do not stay in bed.Resting more than 1 or 2 days can delay your recovery.  Do not avoid exercise or work.Your body is made to move.It is not dangerous to be active, even though your back may hurt.Your back will likely heal faster if you return to being active before your pain is gone.  Pay attention to your body when you bend and lift. Many people have less discomfortwhen lifting if they bend their knees, keep the load close to their bodies,and  avoid twisting. Often, the most comfortable positions are those that put less stress on your recovering back.  Find a comfortable position to sleep. Use a firm mattress and lie on your side with your knees slightly bent. If you lie on your back, put a pillow under your knees.  Only take over-the-counter or prescription medicines as directed by your caregiver. Over-the-counter medicines to reduce pain and inflammation are often the most helpful.Your caregiver may prescribe muscle relaxant drugs.These medicines help dull your pain so you can more quickly return to your normal activities and healthy exercise.  Put ice on the injured area.  Put ice in a plastic bag.  Place a towel between your skin and the bag.  Leave the ice on for 15 to 20 minutes, 3 to 4 times a day for the first 2 to 3 days. After that, ice and heat may be alternated to reduce pain and spasms.  Ask your caregiver about trying back exercises and gentle massage. This may be of some benefit.  Avoid feeling anxious or stressed.Stress increases muscle tension and can worsen back pain.It is important to recognize when you are anxious or stressed and learn ways to manage it.Exercise is a great option. SEEK MEDICAL CARE IF:  You have pain that is not relieved with rest or medicine.  You have pain that does not improve in 1 week.  You have new symptoms.  You are generally   not feeling well. SEEK IMMEDIATE MEDICAL CARE IF:   You have pain that radiates from your back into your legs.  You develop new bowel or bladder control problems.  You have unusual weakness or numbness in your arms or legs.  You develop nausea or vomiting.  You develop abdominal pain.  You feel faint. Document Released: 04/19/2005 Document Revised: 10/19/2011 Document Reviewed: 09/07/2010 Exit[employer] Patient Information 2013 [employer].  
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
## 2012-08-22 ENCOUNTER — Telehealth: Payer: Self-pay

## 2012-08-22 DIAGNOSIS — M549 Dorsalgia, unspecified: Secondary | ICD-10-CM

## 2012-08-22 NOTE — Telephone Encounter (Signed)
Message copied by Arnette Norris on Tue Aug 22, 2012  9:04 AM ------      Message from: Lelon Perla      Created: Thu Aug 17, 2012  2:21 PM       Normal---  Consider PT if no better and MRI ------

## 2012-08-22 NOTE — Telephone Encounter (Signed)
Discussed with patient and she stated she wanted [place] ahead and get the MRI. She said she had a Hx of DDD and a bulging disc. MRI put in.      KP

## 2012-08-24 ENCOUNTER — Telehealth: Payer: Self-pay | Admitting: Internal Medicine

## 2012-08-24 DIAGNOSIS — I10 Essential (primary) hypertension: Secondary | ICD-10-CM

## 2012-08-24 DIAGNOSIS — E039 Hypothyroidism, unspecified: Secondary | ICD-10-CM

## 2012-08-24 MED ORDER — POTASSIUM CHLORIDE CRYS ER 20 MEQ PO TBCR: EXTENDED_RELEASE_TABLET | ORAL | Status: DC

## 2012-08-24 MED ORDER — METOPROLOL TARTRATE 25 MG PO TABS: 50.0000 mg | ORAL_TABLET | Freq: Two times a day (BID) | ORAL | Status: DC

## 2012-08-24 MED ORDER — SPIRONOLACTONE 25 MG PO TABS: 25.0000 mg | ORAL_TABLET | Freq: Every day | ORAL | Status: DC

## 2012-08-24 MED ORDER — LEVOTHYROXINE SODIUM 100 MCG PO TABS: 100.0000 ug | ORAL_TABLET | Freq: Every day | ORAL | Status: DC

## 2012-08-24 NOTE — Telephone Encounter (Signed)
Refill: Levothyroxine tab Topiramate tab  **Marked as urgent**

## 2012-08-24 NOTE — Telephone Encounter (Signed)
RX's sent electronically to [facility]

## 2012-08-24 NOTE — Telephone Encounter (Signed)
K-DUR TABLET QTY: 90  SPIRONOLACTONE Qty: 90  METOPROLOL TART  Qty:90

## 2012-08-26 ENCOUNTER — Ambulatory Visit (HOSPITAL_BASED_OUTPATIENT_CLINIC_OR_DEPARTMENT_OTHER)
Admission: RE | Admit: 2012-08-26 | Discharge: 2012-08-26 | Disposition: A | Discharge status: Home / Self Care | Payer: 59 | Source: Ambulatory Visit | Attending: Family Medicine | Admitting: Family Medicine

## 2012-08-26 DIAGNOSIS — M5137 Other intervertebral disc degeneration, lumbosacral region: Secondary | ICD-10-CM | POA: Insufficient documentation

## 2012-08-26 DIAGNOSIS — M51379 Other intervertebral disc degeneration, lumbosacral region without mention of lumbar back pain or lower extremity pain: Secondary | ICD-10-CM | POA: Insufficient documentation

## 2012-08-26 DIAGNOSIS — M549 Dorsalgia, unspecified: Secondary | ICD-10-CM

## 2012-08-26 DIAGNOSIS — M5126 Other intervertebral disc displacement, lumbar region: Secondary | ICD-10-CM | POA: Insufficient documentation

## 2012-08-28 ENCOUNTER — Telehealth: Payer: Self-pay | Admitting: Internal Medicine

## 2012-08-28 NOTE — Telephone Encounter (Signed)
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

## 2012-08-28 NOTE — Telephone Encounter (Signed)
Patient aware that she has not seen Dr.Hopper since January and he will be unable to provide her with a work note. Patient indicated that this phone note should be sent to Dr.Lowne's attention. Patient would like a work note from 08/10/2012 (date seen in ER) through 08/29/2012 (patient would like to return to work tomorrow). Patient would like this work note and MRI faxed to [phone]

## 2012-08-28 NOTE — Telephone Encounter (Signed)
Pt needs work notes and MRI results. She asked that the work note be faxed to : [phone].

## 2012-08-28 NOTE — Telephone Encounter (Signed)
Notes Recorded by Lelon Perla, DO on 08/28/2012 at 10:03 AM + disc protrusion -- -no nerve impingement Can refer to ortho or NS if pain persists   Discussed with patient and she voiced understanding. She will see how she felt and let us know if she needs the referral.      KP

## 2012-08-29 ENCOUNTER — Other Ambulatory Visit: Payer: Self-pay | Admitting: Internal Medicine

## 2012-08-29 ENCOUNTER — Telehealth: Payer: Self-pay | Admitting: Internal Medicine

## 2012-08-29 ENCOUNTER — Telehealth: Payer: Self-pay | Admitting: Family Medicine

## 2012-08-29 DIAGNOSIS — M5126 Other intervertebral disc displacement, lumbar region: Secondary | ICD-10-CM

## 2012-08-29 NOTE — Telephone Encounter (Signed)
Pt called to let dr Laury Axon know that she would like to receive a referral for her back and also that she did not go to work today. thanks

## 2012-08-29 NOTE — Telephone Encounter (Signed)
Refer to neurosurgery. 

## 2012-08-29 NOTE — Telephone Encounter (Signed)
Please advise      KP 

## 2012-08-29 NOTE — Telephone Encounter (Signed)
Ref entered     KP

## 2012-08-29 NOTE — Telephone Encounter (Signed)
error 

## 2012-09-01 DIAGNOSIS — Z0279 Encounter for issue of other medical certificate: Secondary | ICD-10-CM

## 2012-10-09 ENCOUNTER — Telehealth: Payer: Self-pay | Admitting: *Deleted
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
## 2012-10-09 NOTE — Telephone Encounter (Signed)
i filled out her paperwork for the problem she came in for---- she wants the fmla filled out for Migraines I believe---I did not see her for that.

## 2012-10-09 NOTE — Telephone Encounter (Signed)
Fax received from pt stating disability claim was denied due to lack of response from our office. I faxed a note to the pt to clarify if disability was for migraines or back pain. Awaiting response.

## 2012-10-10 NOTE — Telephone Encounter (Signed)
Patient called from a friend's phone to let us know that her phone has now been disconnected. The only method of contact is to fax her at [phone].

## 2012-10-10 NOTE — Telephone Encounter (Signed)
Patient called requesting to speak with Dois Davenport. She would like her MRI from 08/28/12 sent to her. CB# [phone]

## 2012-10-17 ENCOUNTER — Telehealth: Payer: Self-pay | Admitting: Family Medicine

## 2012-10-17 NOTE — Telephone Encounter (Signed)
Pt called and wanted to talk to you about getting fired from her job. Please call patient back as soon as possible. thanks

## 2012-10-18 ENCOUNTER — Telehealth: Payer: Self-pay | Admitting: *Deleted

## 2012-10-18 NOTE — Telephone Encounter (Signed)
Patient called the office stating "Now that your office has cost me my job, now what are you going to do?" I asked for clarification and pt stated that her insurance company sent [place] paperwork to fill out and we never sent it back to them. I advised the patient that when original paperwork was filled out by Dr. Laury Axon, she made a note stating "Neurosurgery should fill out any further paperwork if pt needs to be out." After it was completed it was then  faxed to her insurance company and to the patient at her request. Patient again made aware that neurosurgery would be responsible for filling out any subsequent paperwork for extended time out of work per Dr. Hulan Saas' note.

## 2012-10-18 NOTE — Telephone Encounter (Signed)
i can

## 2012-10-18 NOTE — Telephone Encounter (Signed)
Patient advised Dr. Alwyn Ren that she wanted to change PCP to Dr. Laury Axon during their last visit. Hopp was agreeable to this but wanted to know if you were agreeable to take her on as a patient. Due to the fact that she has requested a change in PCP, he "does not feel comfortable continuing to provide her care." During my previous conversation with her, she stated that she would be switching to Dr. Laury Axon at the next appt. Are you comfortable taking her on as a patient?

## 2012-10-18 NOTE — Telephone Encounter (Signed)
I filled out the paper work --- and have not heard anything else until today-- Alfonse Flavors is her pcp.

## 2012-11-10 ENCOUNTER — Telehealth: Payer: Self-pay | Admitting: *Deleted

## 2012-11-10 NOTE — Telephone Encounter (Signed)
Message copied by Baldwin [place] on Fri Nov 10, 2012  9:32 AM ------      Message from: Julieanne Manson      Created: Thu Nov 09, 2012  4:02 PM      Regarding: Peer-to-Peer for Disability Review      Contact: [phone]       Dinah Beers with [employer] a medical review company is calling on behalf of Dr. Sharen Hones an independent medical reviewer who is reviewing the patient's disability case on behalf of her insurance company. Dr. Sharen Hones is requesting a peer-to-peer in regards to this patient for a disability review. He wants to get more information on the patient's disability.       To set this up Edgardo Roys is asking that we call back at [phone].            Adero ------
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
## 2013-03-06 ENCOUNTER — Telehealth: Payer: Self-pay | Admitting: Family Medicine

## 2013-03-06 NOTE — Telephone Encounter (Signed)
Patient is calling and states that she was just informed that she has a court hearing tomorrow at 8am and she is asking that we provide her with a letter that explains what she had to go through with getting her disability forms filled out. Patient states that one of the things her job faulted her on was getting her disability form filled out in a timely manner. Please advise.

## 2013-03-06 NOTE — Telephone Encounter (Signed)
Letter printed and faxed to patient at [phone].

## 2013-03-08 ENCOUNTER — Other Ambulatory Visit: Payer: Self-pay

## 2013-04-11 ENCOUNTER — Encounter: Payer: Self-pay | Admitting: Family Medicine

## 2013-04-11 ENCOUNTER — Ambulatory Visit (INDEPENDENT_AMBULATORY_CARE_PROVIDER_SITE_OTHER): Payer: BC Managed Care – PPO | Admitting: Family Medicine

## 2013-04-11 VITALS — BP 142/86 | HR 70 | Temp 98.6°F | Wt 192.0 lb

## 2013-04-11 DIAGNOSIS — E876 Hypokalemia: Secondary | ICD-10-CM

## 2013-04-11 DIAGNOSIS — I10 Essential (primary) hypertension: Secondary | ICD-10-CM

## 2013-04-11 DIAGNOSIS — K219 Gastro-esophageal reflux disease without esophagitis: Secondary | ICD-10-CM

## 2013-04-11 DIAGNOSIS — E669 Obesity, unspecified: Secondary | ICD-10-CM | POA: Insufficient documentation

## 2013-04-11 DIAGNOSIS — F319 Bipolar disorder, unspecified: Secondary | ICD-10-CM

## 2013-04-11 DIAGNOSIS — Z23 Encounter for immunization: Secondary | ICD-10-CM

## 2013-04-11 DIAGNOSIS — E785 Hyperlipidemia, unspecified: Secondary | ICD-10-CM

## 2013-04-11 DIAGNOSIS — E039 Hypothyroidism, unspecified: Secondary | ICD-10-CM

## 2013-04-11 LAB — LIPID PANEL
Cholesterol: 187 mg/dL (ref 0–200)
HDL: 47.6 mg/dL (ref >39.00)
LDL Cholesterol: 117 mg/dL — ABNORMAL HIGH (ref 0–99)
Total CHOL/HDL Ratio: 4
Triglycerides: 110 mg/dL (ref 0.0–149.0)
VLDL: 22 mg/dL (ref 0.0–40.0)

## 2013-04-11 LAB — BASIC METABOLIC PANEL
BUN: 11 mg/dL (ref 6–23)
CO2: 28 mEq/L (ref 19–32)
Calcium: 9.1 mg/dL (ref 8.4–10.5)
Chloride: 107 mEq/L (ref 96–112)
Creatinine, Ser: 1 mg/dL (ref 0.4–1.2)
GFR: 60.44 mL/min (ref >60.00)
Glucose, Bld: 95 mg/dL (ref 70–99)
Potassium: 4.1 mEq/L (ref 3.5–5.1)
Sodium: 143 mEq/L (ref 135–145)

## 2013-04-11 LAB — CBC WITH DIFFERENTIAL/PLATELET
Basophils Absolute: 0 10*3/uL (ref 0.0–0.1)
Basophils Relative: 0.3 % (ref 0.0–3.0)
Eosinophils Absolute: 0.2 10*3/uL (ref 0.0–0.7)
Eosinophils Relative: 2 % (ref 0.0–5.0)
HCT: 41.7 % (ref 36.0–46.0)
Hemoglobin: 13.8 g/dL (ref 12.0–15.0)
Lymphocytes Relative: 20.9 % (ref 12.0–46.0)
Lymphs Abs: 1.6 10*3/uL (ref 0.7–4.0)
MCHC: 33.1 g/dL (ref 30.0–36.0)
MCV: 93.7 fl (ref 78.0–100.0)
Monocytes Absolute: 0.4 10*3/uL (ref 0.1–1.0)
Monocytes Relative: 5.1 % (ref 3.0–12.0)
Neutro Abs: 5.5 10*3/uL (ref 1.4–7.7)
Neutrophils Relative %: 71.7 % (ref 43.0–77.0)
Platelets: 176 10*3/uL (ref 150.0–400.0)
RBC: 4.45 Mil/uL (ref 3.87–5.11)
RDW: 13.2 % (ref 11.5–14.6)
WBC: 7.7 10*3/uL (ref 4.5–10.5)

## 2013-04-11 LAB — HEPATIC FUNCTION PANEL
ALT: 23 U/L (ref 0–35)
AST: 20 U/L (ref 0–37)
Albumin: 4.1 g/dL (ref 3.5–5.2)
Alkaline Phosphatase: 78 U/L (ref 39–117)
Bilirubin, Direct: 0 mg/dL (ref 0.0–0.3)
Total Bilirubin: 0.5 mg/dL (ref 0.3–1.2)
Total Protein: 7.6 g/dL (ref 6.0–8.3)

## 2013-04-11 LAB — TSH: TSH: 1.57 u[IU]/mL (ref 0.35–5.50)

## 2013-04-11 MED ORDER — BUPROPION HCL 100 MG PO TABS: 300.0000 mg | ORAL_TABLET | Freq: Every day | ORAL | Status: DC

## 2013-04-11 MED ORDER — SPIRONOLACTONE 25 MG PO TABS: 25.0000 mg | ORAL_TABLET | Freq: Every day | ORAL | Status: DC

## 2013-04-11 MED ORDER — DEXLANSOPRAZOLE 60 MG PO CPDR: 60.0000 mg | DELAYED_RELEASE_CAPSULE | Freq: Every day | ORAL | Status: DC

## 2013-04-11 MED ORDER — LURASIDONE HCL 60 MG PO TABS: ORAL_TABLET | ORAL | Status: DC

## 2013-04-11 MED ORDER — LOSARTAN POTASSIUM 100 MG PO TABS: 100.0000 mg | ORAL_TABLET | Freq: Every day | ORAL | Status: DC

## 2013-04-11 MED ORDER — METOPROLOL TARTRATE 25 MG PO TABS: 50.0000 mg | ORAL_TABLET | Freq: Two times a day (BID) | ORAL | Status: DC

## 2013-04-11 MED ORDER — LEVOTHYROXINE SODIUM 100 MCG PO TABS: 100.0000 ug | ORAL_TABLET | Freq: Every day | ORAL | Status: DC

## 2013-04-11 MED ORDER — LAMOTRIGINE 150 MG PO TABS: 300.0000 mg | ORAL_TABLET | Freq: Every day | ORAL | Status: DC

## 2013-04-11 MED ORDER — POTASSIUM CHLORIDE CRYS ER 20 MEQ PO TBCR: EXTENDED_RELEASE_TABLET | ORAL | Status: DC

## 2013-04-11 NOTE — Assessment & Plan Note (Signed)
Check labs 

## 2013-04-11 NOTE — Assessment & Plan Note (Signed)
Losartan refilled 

## 2013-04-11 NOTE — Progress Notes (Signed)
Pre visit review using our clinic review tool, if applicable. No additional management support is needed unless otherwise documented below in the visit note. 

## 2013-04-11 NOTE — Assessment & Plan Note (Signed)
Refill meds F/u psych

## 2013-04-11 NOTE — Assessment & Plan Note (Signed)
Refill meds Check labs

## 2013-04-11 NOTE — Assessment & Plan Note (Signed)
Check labs con't K

## 2013-04-11 NOTE — Patient Instructions (Signed)

## 2013-04-11 NOTE — Progress Notes (Signed)
  Subjective:    Patient here for follow-up of elevated blood pressure.  She is not exercising and is adherent to a low-salt diet.  Blood pressure is not well controlled at home. Cardiac symptoms: fatigue. Patient denies: chest pain, chest pressure/discomfort, claudication, dyspnea, exertional chest pressure/discomfort, irregular heart beat, lower extremity edema, near-syncope, orthopnea, palpitations, paroxysmal nocturnal dyspnea, syncope and tachypnea. Cardiovascular risk factors: hypertension, obesity (BMI >= 30 kg/m2) and sedentary lifestyle. Use of agents associated with hypertension: none. History of target organ damage: none.  She also needs refills on all her meds including her thyroid.  She has been off meds since the last time she was seen because she lost her insurance.    The following portions of the patient's history were reviewed and updated as appropriate: allergies, current medications, past family history, past medical history, past social history, past surgical history and problem list.  Review of Systems Pertinent items are noted in HPI.     Objective:    BP 142/86  Pulse 70  Temp(Src) 98.6 F (37 C) (Oral)  Wt 192 lb (87.091 kg)  SpO2 97% General appearance: alert, cooperative, appears stated age and no distress Lungs: clear to auscultation bilaterally Heart: S1, S2 normal Extremities: extremities normal, atraumatic, no cyanosis or edema    Assessment:    Hypertension, stage 1 . Evidence of target organ damage: none.    Plan:    Medication: resume losartan and spironolactone. Dietary sodium restriction. Regular aerobic exercise. Follow up: 3 months and as needed.

## 2013-04-12 ENCOUNTER — Ambulatory Visit: Payer: Self-pay | Admitting: Family Medicine

## 2013-04-12 ENCOUNTER — Other Ambulatory Visit: Payer: Self-pay

## 2013-04-12 DIAGNOSIS — I10 Essential (primary) hypertension: Secondary | ICD-10-CM

## 2013-04-12 MED ORDER — SPIRONOLACTONE 25 MG PO TABS: 25.0000 mg | ORAL_TABLET | Freq: Every day | ORAL | Status: DC

## 2013-04-24 ENCOUNTER — Other Ambulatory Visit: Payer: Self-pay | Admitting: Neurosurgery

## 2013-04-24 DIAGNOSIS — M5416 Radiculopathy, lumbar region: Secondary | ICD-10-CM

## 2013-04-27 ENCOUNTER — Ambulatory Visit
Admission: RE | Admit: 2013-04-27 | Discharge: 2013-04-27 | Disposition: A | Discharge status: Home / Self Care | Payer: BC Managed Care – PPO | Source: Ambulatory Visit | Attending: Neurosurgery | Admitting: Neurosurgery

## 2013-04-27 DIAGNOSIS — M5416 Radiculopathy, lumbar region: Secondary | ICD-10-CM

## 2013-04-27 MED ORDER — METHYLPREDNISOLONE ACETATE 40 MG/ML INJ SUSP (RADIOLOG
120.0000 mg | Freq: Once | INTRAMUSCULAR | Status: AC
  Administered 2013-04-27: 120 mg via EPIDURAL

## 2013-04-27 MED ORDER — IOHEXOL 180 MG/ML  SOLN
1.0000 mL | Freq: Once | INTRAMUSCULAR | Status: AC | PRN
  Administered 2013-04-27: 1 mL via EPIDURAL

## 2013-05-05 ENCOUNTER — Encounter (HOSPITAL_BASED_OUTPATIENT_CLINIC_OR_DEPARTMENT_OTHER): Payer: Self-pay | Admitting: Emergency Medicine

## 2013-05-05 ENCOUNTER — Emergency Department (HOSPITAL_BASED_OUTPATIENT_CLINIC_OR_DEPARTMENT_OTHER)
Admission: EM | Admit: 2013-05-05 | Discharge: 2013-05-05 | Disposition: A | Discharge status: Home / Self Care | Payer: BC Managed Care – PPO | Attending: Emergency Medicine | Admitting: Emergency Medicine

## 2013-05-05 DIAGNOSIS — Z9283 Personal history of failed moderate sedation: Secondary | ICD-10-CM | POA: Insufficient documentation

## 2013-05-05 DIAGNOSIS — R509 Fever, unspecified: Secondary | ICD-10-CM | POA: Insufficient documentation

## 2013-05-05 DIAGNOSIS — K219 Gastro-esophageal reflux disease without esophagitis: Secondary | ICD-10-CM | POA: Insufficient documentation

## 2013-05-05 DIAGNOSIS — R42 Dizziness and giddiness: Secondary | ICD-10-CM | POA: Insufficient documentation

## 2013-05-05 DIAGNOSIS — Z791 Long term (current) use of non-steroidal anti-inflammatories (NSAID): Secondary | ICD-10-CM | POA: Insufficient documentation

## 2013-05-05 DIAGNOSIS — R000 Tachycardia, unspecified: Secondary | ICD-10-CM | POA: Insufficient documentation

## 2013-05-05 DIAGNOSIS — K529 Noninfective gastroenteritis and colitis, unspecified: Secondary | ICD-10-CM

## 2013-05-05 DIAGNOSIS — N390 Urinary tract infection, site not specified: Secondary | ICD-10-CM | POA: Insufficient documentation

## 2013-05-05 DIAGNOSIS — H5789 Other specified disorders of eye and adnexa: Secondary | ICD-10-CM | POA: Insufficient documentation

## 2013-05-05 DIAGNOSIS — R34 Anuria and oliguria: Secondary | ICD-10-CM | POA: Insufficient documentation

## 2013-05-05 DIAGNOSIS — IMO0001 Reserved for inherently not codable concepts without codable children: Secondary | ICD-10-CM | POA: Insufficient documentation

## 2013-05-05 DIAGNOSIS — Z860100 Personal history of colon polyps, unspecified: Secondary | ICD-10-CM | POA: Insufficient documentation

## 2013-05-05 DIAGNOSIS — F329 Major depressive disorder, single episode, unspecified: Secondary | ICD-10-CM | POA: Insufficient documentation

## 2013-05-05 DIAGNOSIS — K5289 Other specified noninfective gastroenteritis and colitis: Secondary | ICD-10-CM | POA: Insufficient documentation

## 2013-05-05 DIAGNOSIS — R51 Headache: Secondary | ICD-10-CM | POA: Insufficient documentation

## 2013-05-05 DIAGNOSIS — F3289 Other specified depressive episodes: Secondary | ICD-10-CM | POA: Insufficient documentation

## 2013-05-05 DIAGNOSIS — F411 Generalized anxiety disorder: Secondary | ICD-10-CM | POA: Insufficient documentation

## 2013-05-05 DIAGNOSIS — G43909 Migraine, unspecified, not intractable, without status migrainosus: Secondary | ICD-10-CM | POA: Insufficient documentation

## 2013-05-05 DIAGNOSIS — E039 Hypothyroidism, unspecified: Secondary | ICD-10-CM | POA: Insufficient documentation

## 2013-05-05 DIAGNOSIS — Z880 Allergy status to penicillin: Secondary | ICD-10-CM | POA: Insufficient documentation

## 2013-05-05 DIAGNOSIS — Z79899 Other long term (current) drug therapy: Secondary | ICD-10-CM | POA: Insufficient documentation

## 2013-05-05 LAB — BASIC METABOLIC PANEL
BUN: 15 mg/dL (ref 6–23)
CO2: 24 mEq/L (ref 19–32)
Calcium: 9.3 mg/dL (ref 8.4–10.5)
Chloride: 96 mEq/L (ref 96–112)
Creatinine, Ser: 1.1 mg/dL (ref 0.50–1.10)
GFR calc Af Amer: 64 mL/min — ABNORMAL LOW (ref >90)
GFR calc non Af Amer: 55 mL/min — ABNORMAL LOW (ref >90)
Glucose, Bld: 121 mg/dL — ABNORMAL HIGH (ref 70–99)
Potassium: 4.2 mEq/L (ref 3.7–5.3)
Sodium: 137 mEq/L (ref 137–147)

## 2013-05-05 LAB — URINALYSIS, ROUTINE W REFLEX MICROSCOPIC
Bilirubin Urine: NEGATIVE
Glucose, UA: NEGATIVE mg/dL
Ketones, ur: NEGATIVE mg/dL
Nitrite: POSITIVE — AB
Protein, ur: NEGATIVE mg/dL
Specific Gravity, Urine: 1.014 (ref 1.005–1.030)
Urobilinogen, UA: 0.2 mg/dL (ref 0.0–1.0)
pH: 6 (ref 5.0–8.0)

## 2013-05-05 LAB — URINE MICROSCOPIC-ADD ON

## 2013-05-05 MED ORDER — PROMETHAZINE HCL 25 MG/ML IJ SOLN
12.5000 mg | Freq: Once | INTRAMUSCULAR | Status: AC
  Administered 2013-05-05: 6.25 mg via INTRAVENOUS
  Filled 2013-05-05 (×2): qty 1

## 2013-05-05 MED ORDER — SODIUM CHLORIDE 0.9 % IV SOLN
Freq: Once | INTRAVENOUS | Status: AC
  Administered 2013-05-05: 15:00:00 via INTRAVENOUS

## 2013-05-05 MED ORDER — ACETAMINOPHEN 500 MG PO TABS
1000.0000 mg | ORAL_TABLET | Freq: Once | ORAL | Status: AC
  Administered 2013-05-05: 1000 mg via ORAL
  Filled 2013-05-05: qty 2

## 2013-05-05 MED ORDER — ONDANSETRON HCL 4 MG/2ML IJ SOLN
4.0000 mg | Freq: Once | INTRAMUSCULAR | Status: AC
  Administered 2013-05-05: 4 mg via INTRAVENOUS
  Filled 2013-05-05: qty 2

## 2013-05-05 MED ORDER — ONDANSETRON HCL 4 MG/2ML IJ SOLN
INTRAMUSCULAR | Status: AC
  Filled 2013-05-05: qty 2

## 2013-05-05 MED ORDER — CIPROFLOXACIN HCL 500 MG PO TABS: 500.0000 mg | ORAL_TABLET | Freq: Two times a day (BID) | ORAL | Status: DC

## 2013-05-05 MED ORDER — ONDANSETRON 8 MG PO TBDP: 8.0000 mg | ORAL_TABLET | Freq: Three times a day (TID) | ORAL | Status: DC | PRN

## 2013-05-05 MED ORDER — KETOROLAC TROMETHAMINE 30 MG/ML IJ SOLN
30.0000 mg | Freq: Once | INTRAMUSCULAR | Status: AC
  Administered 2013-05-05: 30 mg via INTRAVENOUS
  Filled 2013-05-05: qty 1

## 2013-05-05 MED ORDER — PROMETHAZINE HCL 12.5 MG PO TABS: 12.5000 mg | ORAL_TABLET | Freq: Four times a day (QID) | ORAL | Status: DC | PRN

## 2013-05-05 MED ORDER — ONDANSETRON HCL 4 MG/2ML IJ SOLN
4.0000 mg | Freq: Once | INTRAMUSCULAR | Status: AC
  Administered 2013-05-05: 4 mg via INTRAVENOUS

## 2013-05-05 NOTE — Discharge Instructions (Signed)
Diet for Diarrhea, Adult Frequent, runny stools (diarrhea) may be caused or worsened by food or drink. Diarrhea may be relieved by changing your diet. Since diarrhea can last up to 7 days, it is easy for you to lose too much fluid from the body and become dehydrated. Fluids that are lost need to be replaced. Along with a modified diet, make sure you drink enough fluids to keep your urine clear or pale yellow. DIET INSTRUCTIONS  Ensure adequate fluid intake (hydration): have 1 cup (8 oz) of fluid for each diarrhea episode. Avoid fluids that contain simple sugars or sports drinks, fruit juices, whole milk products, and sodas. Your urine should be clear or pale yellow if you are drinking enough fluids. Hydrate with an oral rehydration solution that you can purchase at pharmacies, retail stores, and online. You can prepare an oral rehydration solution at home by mixing the following ingredients together:    tsp table salt.   tsp baking soda.   tsp salt substitute containing potassium chloride.  1  tablespoons sugar.  1 L (34 oz) of water.  Certain foods and beverages may increase the speed at which food moves through the gastrointestinal (GI) tract. These foods and beverages should be avoided and include:  Caffeinated and alcoholic beverages.  High-fiber foods, such as raw fruits and vegetables, nuts, seeds, and whole grain breads and cereals.  Foods and beverages sweetened with sugar alcohols, such as xylitol, sorbitol, and mannitol.  Some foods may be well tolerated and may help thicken stool including:  Starchy foods, such as rice, toast, pasta, low-sugar cereal, oatmeal, grits, baked potatoes, crackers, and bagels.   Bananas.   Applesauce.  Add probiotic-rich foods to help increase healthy bacteria in the GI tract, such as yogurt and fermented milk products. RECOMMENDED FOODS AND BEVERAGES Starches Choose foods with less than 2 g of fiber per serving.  Recommended:  White,  [place], and pita breads, plain rolls, buns, bagels. Plain muffins, matzo. Soda, saltine, or graham crackers. Pretzels, melba toast, zwieback. Cooked cereals made with water: cornmeal, farina, cream cereals. Dry cereals: refined corn, wheat, rice. Potatoes prepared any way without skins, refined macaroni, spaghetti, noodles, refined rice.  Avoid:  Bread, rolls, or crackers made with whole wheat, multi-grains, rye, bran seeds, nuts, or coconut. Corn tortillas or taco shells. Cereals containing whole grains, multi-grains, bran, coconut, nuts, raisins. Cooked or dry oatmeal. Coarse wheat cereals, granola. Cereals advertised as "high-fiber." Potato skins. Whole grain pasta, wild or brown rice. Popcorn. Sweet potatoes, yams. Sweet rolls, doughnuts, waffles, pancakes, sweet breads. Vegetables  Recommended: Strained tomato and vegetable juices. Most well-cooked and canned vegetables without seeds. Fresh: Tender lettuce, cucumber without the skin, cabbage, spinach, bean sprouts.  Avoid: Fresh, cooked, or canned: Artichokes, baked beans, beet greens, broccoli, Brussels sprouts, corn, kale, legumes, peas, sweet potatoes. Cooked: Green or red cabbage, spinach. Avoid large servings of any vegetables because vegetables shrink when cooked, and they contain more fiber per serving than fresh vegetables. Fruit  Recommended: Cooked or canned: Apricots, applesauce, cantaloupe, cherries, fruit cocktail, grapefruit, grapes, kiwi, mandarin oranges, peaches, pears, plums, watermelon. Fresh: Apples without skin, ripe banana, grapes, cantaloupe, cherries, grapefruit, peaches, oranges, plums. Keep servings limited to  cup or 1 piece.  Avoid: Fresh: Apples with skin, apricots, mangoes, pears, raspberries, strawberries. Prune juice, stewed or dried prunes. Dried fruits, raisins, dates. Large servings of all fresh fruits. Protein  Recommended: Ground or well-cooked tender beef, ham, veal, lamb, pork, or poultry. Eggs. Fish,  oysters, shrimp,  lobster, other seafoods. Liver, organ meats.  Avoid: Tough, fibrous meats with gristle. Peanut butter, smooth or chunky. Cheese, nuts, seeds, legumes, dried peas, beans, lentils. Dairy  Recommended: Yogurt, lactose-free milk, kefir, drinkable yogurt, buttermilk, soy milk, or plain hard cheese.  Avoid: Milk, chocolate milk, beverages made with milk, such as milkshakes. Soups  Recommended: Bouillon, broth, or soups made from allowed foods. Any strained soup.  Avoid: Soups made from vegetables that are not allowed, cream or milk-based soups. Desserts and Sweets  Recommended: Sugar-free gelatin, sugar-free frozen ice pops made without sugar alcohol.  Avoid: Plain cakes and cookies, pie made with fruit, pudding, custard, cream pie. Gelatin, fruit, ice, sherbet, frozen ice pops. Ice cream, ice milk without nuts. Plain hard candy, honey, jelly, molasses, syrup, sugar, chocolate syrup, gumdrops, marshmallows. Fats and Oils  Recommended: Limit fats to less than 8 tsp per day.  Avoid: Seeds, nuts, olives, avocados. Margarine, butter, cream, mayonnaise, salad oils, plain salad dressings. Plain gravy, crisp bacon without rind. Beverages  Recommended: Water, decaffeinated teas, oral rehydration solutions, sugar-free beverages not sweetened with sugar alcohols.  Avoid: Fruit juices, caffeinated beverages (coffee, tea, soda), alcohol, sports drinks, or lemon-lime soda. Condiments  Recommended: Ketchup, mustard, horseradish, vinegar, cocoa powder. Spices in moderation: allspice, basil, bay leaves, celery powder or leaves, cinnamon, cumin powder, curry powder, ginger, mace, marjoram, onion or garlic powder, oregano, paprika, parsley flakes, ground pepper, rosemary, sage, savory, tarragon, thyme, turmeric.  Avoid: Coconut, honey. Document Released: 07/10/2003 Document Revised: 01/12/2012 Document Reviewed: 09/03/2011 [facility] Patient Information 2014 [place].  Viral  Gastroenteritis Viral gastroenteritis is also known as stomach flu. This condition affects the stomach and intestinal tract. It can cause sudden diarrhea and vomiting. The illness typically lasts 3 to 8 days. Most people develop an immune response that eventually gets rid of the virus. While this natural response develops, the virus can make you quite ill. CAUSES  Many different viruses can cause gastroenteritis, such as rotavirus or noroviruses. You can catch one of these viruses by consuming contaminated food or water. You may also catch a virus by sharing utensils or other personal items with an infected person or by touching a contaminated surface. SYMPTOMS  The most common symptoms are diarrhea and vomiting. These problems can cause a severe loss of body fluids (dehydration) and a body salt (electrolyte) imbalance. Other symptoms may include:  Fever.  Headache.  Fatigue.  Abdominal pain. DIAGNOSIS  Your caregiver can usually diagnose viral gastroenteritis based on your symptoms and a physical exam. A stool sample may also be taken to test for the presence of viruses or other infections. TREATMENT  This illness typically goes away on its own. Treatments are aimed at rehydration. The most serious cases of viral gastroenteritis involve vomiting so severely that you are not able to keep fluids down. In these cases, fluids must be given through an intravenous line (IV). HOME CARE INSTRUCTIONS   Drink enough fluids to keep your urine clear or pale yellow. Drink small amounts of fluids frequently and increase the amounts as tolerated.  Ask your caregiver for specific rehydration instructions.  Avoid:  Foods high in sugar.  Alcohol.  Carbonated drinks.  Tobacco.  Juice.  Caffeine drinks.  Extremely hot or cold fluids.  Fatty, greasy foods.  Too much intake of anything at one time.  Dairy products until 24 to 48 hours after diarrhea stops.  You may consume probiotics.  Probiotics are active cultures of beneficial bacteria. They may lessen the amount and number  of diarrheal stools in adults. Probiotics can be found in yogurt with active cultures and in supplements.  Wash your hands well to avoid spreading the virus.  Only take over-the-counter or prescription medicines for pain, discomfort, or fever as directed by your caregiver. Do not give aspirin to children. Antidiarrheal medicines are not recommended.  Ask your caregiver if you should continue to take your regular prescribed and over-the-counter medicines.  Keep all follow-up appointments as directed by your caregiver. SEEK IMMEDIATE MEDICAL CARE IF:   You are unable to keep fluids down.  You do not urinate at least once every 6 to 8 hours.  You develop shortness of breath.  You notice blood in your stool or vomit. This may look like coffee grounds.  You have abdominal pain that increases or is concentrated in one small area (localized).  You have persistent vomiting or diarrhea.  You have a fever.  The patient is a child younger than 3 months, and he or she has a fever.  The patient is a child older than 3 months, and he or she has a fever and persistent symptoms.  The patient is a child older than 3 months, and he or she has a fever and symptoms suddenly get worse.  The patient is a baby, and he or she has no tears when crying. MAKE SURE YOU:   Understand these instructions.  Will watch your condition.  Will get help right away if you are not doing well or get worse. Document Released: 04/19/2005 Document Revised: 07/12/2011 Document Reviewed: 02/03/2011 [facility] Patient Information 2014 [place].

## 2013-05-05 NOTE — ED Notes (Signed)
Pt c/o n/v/d x 2 days, headache and fever.

## 2013-05-05 NOTE — ED Provider Notes (Signed)
CSN: [id number]     Arrival date & time 05/05/13  1337 History   First MD Initiated Contact with Patient 05/05/13 1418     Chief Complaint  Patient presents with  . Fever  . Emesis   (Consider location/radiation/quality/duration/timing/severity/associated sxs/prior Treatment) Patient is a 58 y.o. female presenting with fever and vomiting. The history is provided by the patient.  Fever Max temp prior to arrival:  102.9 Temp source:  Oral Severity:  Moderate Onset quality:  Gradual Duration:  2 days Timing:  Sporadic Progression:  Worsening Chronicity:  New Ineffective treatments:  None tried Associated symptoms: chills, congestion, diarrhea, headaches, myalgias, nausea and vomiting   Associated symptoms: no confusion, no cough, no dysuria and no rash   Emesis Associated symptoms: chills, diarrhea, headaches and myalgias    MARIEN MANSHIP is a 58 y.o. female who presents to the ED with nausea, vomiting, diarrhea and fever that started 2 days ago. Her daughter was here last week with same. Patient unable to keep anything down.   Past Medical History  Diagnosis Date  . IBS (irritable bowel syndrome)   . Anxiety   . Depression   . Hypothyroidism     hypothyroidism  . Migraine   . Chest pain     Emergency room April 23, 2012, troponin is normal, chest CT scan showed no pulmonary embolus  . Cough     Chronic cough  . Ejection fraction     EF 55-60%, echo, December, 2013, lipomatous hypertrophy of the atrial septum  . GERD (gastroesophageal reflux disease)   . Personal history of failed moderate sedation 06/20/2012  . Personal history of colonic adenoma 06/28/2012   Past Surgical History  Procedure Laterality Date  . Cholecystectomy    . Dilation and curettage of uterus      x3  . Knee surgery      x2  . Wrist surgery      x3  . Laparoscopy  06/03/1990    to evaluate dysfunctional menses & pelvic pain  . Breast lumpectomy      left  . Cystocele repair  01/13/09  .  Upper gastrointestinal endoscopy  2004    esophageal dilation  . Colonoscopy  2002  . Mastectomy, partial      benign fibrocystic changes  . Elbow surgery  2005    left   Family History  Problem Relation Age of Onset  . Coronary artery disease Mother   . Stroke Mother     TIA  . Uterine cancer Mother     BREAST  . Lung cancer Father     LUNG  . Heart disease Father   . Colon cancer Paternal Uncle     COLON; STOMACH  . COPD Father   . Thyroid disease Mother     goiter  . Lung cancer Maternal Grandfather   . Coronary artery disease Father   . Rheum arthritis Maternal Aunt   . Supraventricular tachycardia Daughter     S/P ablation   History  Substance Use Topics  . Smoking status: Never Smoker   . Smokeless tobacco: Never Used  . Alcohol Use: Yes     Comment: occ   OB History   Grav Para Term Preterm Abortions TAB SAB Ect Mult Living                 Review of Systems  Constitutional: Positive for fever and chills.  HENT: Positive for congestion.   Eyes: Positive for redness.  Respiratory: Negative for cough and shortness of breath.   Gastrointestinal: Positive for nausea, vomiting and diarrhea.  Genitourinary: Positive for decreased urine volume. Negative for dysuria and frequency.  Musculoskeletal: Positive for myalgias.  Skin: Negative for rash.  Allergic/Immunologic: Negative for immunocompromised state.  Neurological: Positive for light-headedness and headaches.  Psychiatric/Behavioral: Negative for confusion. The patient is not nervous/anxious.     Allergies  Amoxicillin; Benzonatate; Fentanyl; Midazolam; Oxycodone-aspirin; Sulfonamide derivatives; Hydrocodone; Nalbuphine; Oxycodone-acetaminophen; Venlafaxine; and Verapamil  Home Medications   Current Outpatient Rx  Name  Route  Sig  Dispense  Refill  . ALPRAZolam (XANAX) 0.5 MG tablet   Oral   Take 0.25-0.5 mg by mouth 2 (two) times daily as needed. For anxiety         . buPROPion (WELLBUTRIN)  100 MG tablet   Oral   Take 3 tablets (300 mg total) by mouth daily.   90 tablet   5   . cyclobenzaprine (FLEXERIL) 10 MG tablet   Oral   Take 1 tablet (10 mg total) by mouth 2 (two) times daily as needed for muscle spasms.   20 tablet   0   . dexlansoprazole (DEXILANT) 60 MG capsule   Oral   Take 1 capsule (60 mg total) by mouth daily.   30 capsule   5   . dexlansoprazole (DEXILANT) 60 MG capsule   Oral   Take 1 capsule (60 mg total) by mouth daily.   30 capsule   5   . hyoscyamine (LEVSIN SL) 0.125 MG SL tablet   Sublingual   Place 1 tablet (0.125 mg total) under the tongue every 4 (four) hours as needed.   30 tablet   5   . ibuprofen (ADVIL,MOTRIN) 800 MG tablet   Oral   Take 1 tablet (800 mg total) by mouth 3 (three) times daily.   21 tablet   0   . ketoconazole (NIZORAL) 2 % cream   Topical   Apply topically 2 (two) times daily. Apply bid & blow dry   15 g   1   . lamoTRIgine (LAMICTAL) 150 MG tablet   Oral   Take 2 tablets (300 mg total) by mouth at bedtime. For bi polar disorder   60 tablet   5   . levothyroxine (SYNTHROID, LEVOTHROID) 100 MCG tablet   Oral   Take 1 tablet (100 mcg total) by mouth daily.   90 tablet   1   . losartan (COZAAR) 100 MG tablet   Oral   Take 1 tablet (100 mg total) by mouth daily.   30 tablet   0   . Lurasidone HCl (LATUDA) 60 MG TABS      1 po qd   30 tablet   5   . metoprolol tartrate (LOPRESSOR) 25 MG tablet   Oral   Take 2 tablets (50 mg total) by mouth 2 (two) times daily.   180 tablet   1   . ondansetron (ZOFRAN ODT) 8 MG disintegrating tablet   Oral   Take 1 tablet (8 mg total) by mouth every 8 (eight) hours as needed for nausea or vomiting.   20 tablet   0   . potassium chloride SA (K-DUR,KLOR-CON) 20 MEQ tablet      2 by mouth daily   180 tablet   1   . prochlorperazine (COMPAZINE) 10 MG tablet   Oral   Take 1 tablet (10 mg total) by mouth 2 (two) times daily as needed. Take with  Benadryl  25mg .   20 tablet   0   . promethazine (PHENERGAN) 12.5 MG tablet   Oral   Take 1 tablet (12.5 mg total) by mouth every 6 (six) hours as needed for nausea or vomiting.   30 tablet   0   . spironolactone (ALDACTONE) 25 MG tablet   Oral   Take 1 tablet (25 mg total) by mouth daily.   90 tablet   1   . topiramate (TOPAMAX) 25 MG tablet      TAKE 1 TABLET BY MOUTH TWICE DAILY   60 tablet   2   . traMADol (ULTRAM) 50 MG tablet   Oral   Take 1 tablet (50 mg total) by mouth every 6 (six) hours as needed for pain.   30 tablet   1   . verapamil (CALAN) 120 MG tablet                BP 128/73  Pulse 93  Temp(Src) 98.7 F (37.1 C) (Oral)  Resp 18  SpO2 95% Physical Exam  Nursing note and vitals reviewed. Constitutional: She is oriented to person, place, and time. She appears well-developed and well-nourished.  HENT:  Head: Normocephalic and atraumatic.  Eyes: EOM are normal.  Neck: Neck supple.  Cardiovascular: Tachycardia present.   Pulmonary/Chest: Effort normal.  Abdominal: Soft. Bowel sounds are normal. There is no tenderness. There is no CVA tenderness.  Musculoskeletal: Normal range of motion.  Neurological: She is alert and oriented to person, place, and time. No cranial nerve deficit.  Skin: Skin is warm and dry.  Psychiatric: She has a normal mood and affect. Her behavior is normal.   Results for orders placed during the hospital encounter of 05/05/13 (from the past 24 hour(s))  BASIC METABOLIC PANEL     Status: Abnormal   Collection Time    05/05/13  2:30 PM      Result Value Range   Sodium 137  137 - 147 mEq/L   Potassium 4.2  3.7 - 5.3 mEq/L   Chloride 96  96 - 112 mEq/L   CO2 24  19 - 32 mEq/L   Glucose, Bld 121 (*) 70 - 99 mg/dL   BUN 15  6 - 23 mg/dL   Creatinine, Ser 1.10  0.50 - 1.10 mg/dL   Calcium 9.3  8.4 - 10.5 mg/dL   GFR calc non Af Amer 55 (*) >90 mL/min   GFR calc Af Amer 64 (*) >90 mL/min  URINALYSIS, ROUTINE W REFLEX MICROSCOPIC      Status: Abnormal   Collection Time    05/05/13  4:57 PM      Result Value Range   Color, Urine YELLOW  YELLOW   APPearance CLOUDY (*) CLEAR   Specific Gravity, Urine 1.014  1.005 - 1.030   pH 6.0  5.0 - 8.0   Glucose, UA NEGATIVE  NEGATIVE mg/dL   Hgb urine dipstick TRACE (*) NEGATIVE   Bilirubin Urine NEGATIVE  NEGATIVE   Ketones, ur NEGATIVE  NEGATIVE mg/dL   Protein, ur NEGATIVE  NEGATIVE mg/dL   Urobilinogen, UA 0.2  0.0 - 1.0 mg/dL   Nitrite POSITIVE (*) NEGATIVE   Leukocytes, UA SMALL (*) NEGATIVE  URINE MICROSCOPIC-ADD ON     Status: Abnormal   Collection Time    05/05/13  4:57 PM      Result Value Range   Squamous Epithelial / LPF RARE  RARE   WBC, UA 21-50  <3 WBC/hpf  Bacteria, UA MANY (*) RARE    ED Course  Procedures Labs, iv hydration, IV Zofran x2 and Phenergan IV  MDM  @ 6:45 pm patient feeling much better taking po fluids without nausea and eating crackers.  Patient stable for discharge. BP 128/73  Pulse 93  Temp(Src) 98.7 F (37.1 C) (Oral)  Resp 18  SpO2 95%  Will treat UTI and nausea.  I have reviewed this patient's vital signs, nurses notes, appropriate labs and discussed findings and plan of care with the patient and her husband. They voice understanding.    Medication List    TAKE these medications       ciprofloxacin 500 MG tablet  Commonly known as:  CIPRO  Take 1 tablet (500 mg total) by mouth every 12 (twelve) hours.     ondansetron 8 MG disintegrating tablet  Commonly known as:  ZOFRAN ODT  Take 1 tablet (8 mg total) by mouth every 8 (eight) hours as needed for nausea or vomiting.     promethazine 12.5 MG tablet  Commonly known as:  PHENERGAN  Take 1 tablet (12.5 mg total) by mouth every 6 (six) hours as needed for nausea or vomiting.      ASK your doctor about these medications       ALPRAZolam 0.5 MG tablet  Commonly known as:  XANAX  Take 0.25-0.5 mg by mouth 2 (two) times daily as needed. For anxiety     buPROPion 100 MG  tablet  Commonly known as:  WELLBUTRIN  Take 3 tablets (300 mg total) by mouth daily.     cyclobenzaprine 10 MG tablet  Commonly known as:  FLEXERIL  Take 1 tablet (10 mg total) by mouth 2 (two) times daily as needed for muscle spasms.     dexlansoprazole 60 MG capsule  Commonly known as:  DEXILANT  Take 1 capsule (60 mg total) by mouth daily.     dexlansoprazole 60 MG capsule  Commonly known as:  DEXILANT  Take 1 capsule (60 mg total) by mouth daily.     hyoscyamine 0.125 MG SL tablet  Commonly known as:  LEVSIN SL  Place 1 tablet (0.125 mg total) under the tongue every 4 (four) hours as needed.     ibuprofen 800 MG tablet  Commonly known as:  ADVIL,MOTRIN  Take 1 tablet (800 mg total) by mouth 3 (three) times daily.     ketoconazole 2 % cream  Commonly known as:  NIZORAL  Apply topically 2 (two) times daily. Apply bid & blow dry     lamoTRIgine 150 MG tablet  Commonly known as:  LAMICTAL  Take 2 tablets (300 mg total) by mouth at bedtime. For bi polar disorder     levothyroxine 100 MCG tablet  Commonly known as:  SYNTHROID, LEVOTHROID  Take 1 tablet (100 mcg total) by mouth daily.     losartan 100 MG tablet  Commonly known as:  COZAAR  Take 1 tablet (100 mg total) by mouth daily.     Lurasidone HCl 60 MG Tabs  Commonly known as:  LATUDA  1 po qd     metoprolol tartrate 25 MG tablet  Commonly known as:  LOPRESSOR  Take 2 tablets (50 mg total) by mouth 2 (two) times daily.     potassium chloride SA 20 MEQ tablet  Commonly known as:  K-DUR,KLOR-CON  2 by mouth daily     prochlorperazine 10 MG tablet  Commonly known as:  COMPAZINE  Take 1 tablet (10  mg total) by mouth 2 (two) times daily as needed. Take with Benadryl 25mg .     spironolactone 25 MG tablet  Commonly known as:  ALDACTONE  Take 1 tablet (25 mg total) by mouth daily.     topiramate 25 MG tablet  Commonly known as:  TOPAMAX  TAKE 1 TABLET BY MOUTH TWICE DAILY     traMADol 50 MG tablet   Commonly known as:  ULTRAM  Take 1 tablet (50 mg total) by mouth every 6 (six) hours as needed for pain.     verapamil 120 MG tablet  Commonly known as:  [facility], NP 05/06/13 [phone]

## 2013-05-07 ENCOUNTER — Telehealth: Payer: Self-pay | Admitting: *Deleted

## 2013-05-07 LAB — URINE CULTURE: Colony Count: >=100000

## 2013-05-07 NOTE — ED Provider Notes (Signed)
Medical screening examination/treatment/procedure(s) were conducted as a shared visit with non-physician practitioner(s) or resident and myself. I personally evaluated the patient during the encounter and agree with the findings and plan unless otherwise indicated.  I have personally reviewed any xrays and/ or EKG's with the provider and I agree with interpretation.  Vomiting/ Diarrhea, congestion for two days, fever today. Family members have all had similar. No travel outside country. Exam neck supple, no meningismus, abd soft/ NT, mild dry mm. Fluids, antipyretics. Pt improved on recheck, non toxic appearing. UA pending. Signed out to follow vitals/ fluid status.  Repeat fluid bolus.  Vomiting, Dehydration, Diarrhea, Fever   Mariea Clonts, MD 05/07/13 2321

## 2013-05-07 NOTE — Telephone Encounter (Signed)
[facility] Triage Call Report Triage Record Num: [id number] Operator: Soledad Gerlach Patient Name: Sarah Salazar Call Date & Time: 05/05/2013 11:12:57AM Patient Phone: [phone] PCP: Unice Cobble Patient Gender: Female PCP Fax : [phone] Patient DOB: June 03, 1955 [facility] Name[facility] Reason for Call: Caller: Birttany/Patient; PCP: Unice Cobble; CB#: [phone]; Call regarding Vomiting ; onset of frequent vomiting and diarrhea 2100 05/04/12. c/o severe, sudden headache which started shortly before the vomiting began; per vomiting protocol, advised being seen within 4 hours. As appts full at [facility] for today, advised UC; states will go to UC on [address]. Protocol(s) Used: Nausea or Vomiting Recommended Outcome per Protocol: See Provider within 4 hours Reason for Outcome: New onset of vomiting associated with severe headache Care Advice: ~ Another adult should drive. ~ Call provider if symptoms worsen or new symptoms develop. ~ Do not eat or drink anything until evaluated by provider. ~ Do not take aspirin for headache until discussing with your provider. ~ Go to the ED if has new onset of stiff neck, drowsiness or confusion. ~ SYMPTOM / CONDITION MANAGEMENT ~ IMMEDIATE ACTION ~ CAUTIONS ~ List, or take, all current prescription(s), nonprescription or alternative medication(s) to provider for evaluation.

## 2013-05-11 ENCOUNTER — Ambulatory Visit (INDEPENDENT_AMBULATORY_CARE_PROVIDER_SITE_OTHER): Payer: BC Managed Care – PPO | Admitting: Family Medicine

## 2013-05-11 ENCOUNTER — Encounter: Payer: Self-pay | Admitting: Family Medicine

## 2013-05-11 VITALS — BP 136/90 | HR 107 | Temp 98.6°F | Wt 186.8 lb

## 2013-05-11 DIAGNOSIS — R110 Nausea: Secondary | ICD-10-CM

## 2013-05-11 DIAGNOSIS — E039 Hypothyroidism, unspecified: Secondary | ICD-10-CM

## 2013-05-11 DIAGNOSIS — R002 Palpitations: Secondary | ICD-10-CM

## 2013-05-11 LAB — CBC WITH DIFFERENTIAL/PLATELET
Basophils Absolute: 0 10*3/uL (ref 0.0–0.1)
Basophils Relative: 0 % (ref 0–1)
Eosinophils Absolute: 0.1 10*3/uL (ref 0.0–0.7)
Eosinophils Relative: 1 % (ref 0–5)
HCT: 44.3 % (ref 36.0–46.0)
Hemoglobin: 15.2 g/dL — ABNORMAL HIGH (ref 12.0–15.0)
Lymphocytes Relative: 22 % (ref 12–46)
Lymphs Abs: 2.2 10*3/uL (ref 0.7–4.0)
MCH: 31.3 pg (ref 26.0–34.0)
MCHC: 34.3 g/dL (ref 30.0–36.0)
MCV: 91.3 fL (ref 78.0–100.0)
Monocytes Absolute: 0.7 10*3/uL (ref 0.1–1.0)
Monocytes Relative: 7 % (ref 3–12)
Neutro Abs: 6.8 10*3/uL (ref 1.7–7.7)
Neutrophils Relative %: 70 % (ref 43–77)
Platelets: 227 10*3/uL (ref 150–400)
RBC: 4.85 MIL/uL (ref 3.87–5.11)
RDW: 13.5 % (ref 11.5–15.5)
WBC: 9.8 10*3/uL (ref 4.0–10.5)

## 2013-05-11 NOTE — Assessment & Plan Note (Signed)
Check tsh 

## 2013-05-11 NOTE — Assessment & Plan Note (Signed)
Pt has seen cardiology again Check labs Pt to inc po fluids

## 2013-05-11 NOTE — Patient Instructions (Signed)
Palpitations   A palpitation is the feeling that your heartbeat is irregular or is faster than normal. It may feel like your heart is fluttering or skipping a beat. Palpitations are usually not a serious problem. However, in some cases, you may need further medical evaluation.  CAUSES   Palpitations can be caused by:   Smoking.   Caffeine or other stimulants, such as diet pills or energy drinks.   Alcohol.   Stress and anxiety.   Strenuous physical activity.   Fatigue.   Certain medicines.   Heart disease, especially if you have a history of arrhythmias. This includes atrial fibrillation, atrial flutter, or supraventricular tachycardia.   An improperly working pacemaker or defibrillator.  DIAGNOSIS   To find the cause of your palpitations, your caregiver will take your history and perform a physical exam. Tests may also be done, including:   Electrocardiography (ECG). This test records the heart's electrical activity.   Cardiac monitoring. This allows your caregiver to monitor your heart rate and rhythm in real time.   Holter monitor. This is a portable device that records your heartbeat and can help diagnose heart arrhythmias. It allows your caregiver to track your heart activity for several days, if needed.   Stress tests by exercise or by giving medicine that makes the heart beat faster.  TREATMENT   Treatment of palpitations depends on the cause of your symptoms and can vary greatly. Most cases of palpitations do not require any treatment other than time, relaxation, and monitoring your symptoms. Other causes, such as atrial fibrillation, atrial flutter, or supraventricular tachycardia, usually require further treatment.  HOME CARE INSTRUCTIONS    Avoid:   Caffeinated coffee, tea, soft drinks, diet pills, and energy drinks.   Chocolate.   Alcohol.   Stop smoking if you smoke.   Reduce your stress and anxiety. Things that can help you relax include:   A method that measures bodily functions so  you can learn to control them (biofeedback).   Yoga.   Meditation.   Physical activity such as swimming, jogging, or walking.   Get plenty of rest and sleep.  SEEK MEDICAL CARE IF:    You continue to have a fast or irregular heartbeat beyond 24 hours.   Your palpitations occur more often.  SEEK IMMEDIATE MEDICAL CARE IF:   You develop chest pain or shortness of breath.   You have a severe headache.   You feel dizzy, or you faint.  MAKE SURE YOU:   Understand these instructions.   Will watch your condition.   Will get help right away if you are not doing well or get worse.  Document Released: 04/16/2000 Document Revised: 08/14/2012 Document Reviewed: 06/18/2011  [employer] Patient Information 2014 [employer].

## 2013-05-11 NOTE — Progress Notes (Signed)
Patient received education resource, including the self-management goal and tool. Patient verbalized understanding. 

## 2013-05-11 NOTE — Progress Notes (Signed)
   Subjective:    Patient ID: Sarah Salazar, female    DOB: Jun 16, 1955, 58 y.o.   MRN: [id number]  HPI Pt here f/u ER for palpitations and uti.  She is on cipro --1-2 days left and is still having palpitations.  She has seen cards in past.   + nausea    Review of Systems As above    Objective:   Physical Exam  BP 136/90  Pulse 107  Temp(Src) 98.6 F (37 C) (Oral)  Wt 186 lb 12.8 oz (84.732 kg)  SpO2 97% General appearance: alert, cooperative, appears stated age and no distress Nose: Nares normal. Septum midline. Mucosa normal. No drainage or sinus tenderness. Throat: lips, mucosa, and tongue normal; teeth and gums normal Neck: no adenopathy, no carotid bruit, no JVD, supple, symmetrical, trachea midline and thyroid not enlarged, symmetric, no tenderness/mass/nodules Lungs: clear to auscultation bilaterally Heart: S1, S2 normal and tachy Extremities: extremities normal, atraumatic, no cyanosis or edema      Assessment & Plan:

## 2013-05-12 LAB — BASIC METABOLIC PANEL
BUN: 14 mg/dL (ref 6–23)
CO2: 30 mEq/L (ref 19–32)
Calcium: 9.9 mg/dL (ref 8.4–10.5)
Chloride: 101 mEq/L (ref 96–112)
Creat: 1.02 mg/dL (ref 0.50–1.10)
Glucose, Bld: 94 mg/dL (ref 70–99)
Potassium: 4.1 mEq/L (ref 3.5–5.3)
Sodium: 140 mEq/L (ref 135–145)

## 2013-05-12 LAB — HEPATIC FUNCTION PANEL
ALT: 57 U/L — ABNORMAL HIGH (ref 0–35)
AST: 30 U/L (ref 0–37)
Albumin: 4.2 g/dL (ref 3.5–5.2)
Alkaline Phosphatase: 91 U/L (ref 39–117)
Bilirubin, Direct: 0.1 mg/dL (ref 0.0–0.3)
Indirect Bilirubin: 0.2 mg/dL (ref 0.0–0.9)
Total Bilirubin: 0.3 mg/dL (ref 0.3–1.2)
Total Protein: 7.4 g/dL (ref 6.0–8.3)

## 2013-05-12 LAB — TSH: TSH: 0.876 u[IU]/mL (ref 0.350–4.500)

## 2013-05-21 ENCOUNTER — Telehealth: Payer: Self-pay | Admitting: Family Medicine

## 2013-05-21 NOTE — Telephone Encounter (Signed)
Patient is calling to request a letter for work for [facility] 05/11/2013. She forgot to get one at her last OV. She would like this mailed to her [address] address on file.  Patient is also calling to check the status of getting a Holter Monitor. Please advise.

## 2013-05-21 NOTE — Telephone Encounter (Signed)
Letter mailed. [place] can you check the status on the Holter, Cardiology apt pending as well.     KP

## 2013-06-08 ENCOUNTER — Encounter: Payer: Self-pay | Admitting: Cardiology

## 2013-06-08 ENCOUNTER — Ambulatory Visit (INDEPENDENT_AMBULATORY_CARE_PROVIDER_SITE_OTHER): Payer: BC Managed Care – PPO | Admitting: Cardiology

## 2013-06-08 VITALS — BP 148/86 | HR 84 | Ht 64.0 in | Wt 188.0 lb

## 2013-06-08 DIAGNOSIS — I10 Essential (primary) hypertension: Secondary | ICD-10-CM

## 2013-06-08 DIAGNOSIS — R002 Palpitations: Secondary | ICD-10-CM

## 2013-06-08 NOTE — Assessment & Plan Note (Signed)
Blood pressure is stable. No change in therapy. It is actually difficult to document which medicine she is really taking.

## 2013-06-08 NOTE — Patient Instructions (Signed)
Follow up with PCP

## 2013-06-08 NOTE — Assessment & Plan Note (Signed)
The patient probably had some sinus tachycardia when she was seen in the emergency room for other problems. There was no documented arrhythmia. I am not convinced that she is having any documented significant tachyarrhythmias at this time. I've chosen not to proceed with a monitor. No further cardiac workup is recommended at this time.

## 2013-06-08 NOTE — Progress Notes (Signed)
HPI   Patient is seen today to help assess her heart rate. I had seen her in the past. She has normal left ventricular function. She had a nuclear exercise test in the past that showed no significant abnormality. Early in January, 2015 she was seen in the emergency room. She had some nausea and vomiting and a urinary tract infection. She had some mild sinus tachycardia at that time. She was seen back by primary care and she mentioned that she had some palpitations at the time of the emergency room visit. However in the emergency room there was no documented arrhythmia. She is stable since then. There has been consideration given to having her wear a monitor. I feel that this will not be needed at this time.  Allergies  Allergen Reactions  . Amoxicillin Rash       . Benzonatate Rash    Tessalon Perles and amoxicillin were being taken simultaneously when she developed a rash  . Fentanyl Rash    given with Versed  . Midazolam Rash    given with Fentanyl  . Oxycodone-Aspirin Rash  . Sulfonamide Derivatives Rash    rash  . Hydrocodone Hives and Nausea And Vomiting  . Nalbuphine Nausea And Vomiting  . Oxycodone-Acetaminophen Hives and Nausea And Vomiting  . Venlafaxine Other (See Comments)    Patient says makes her crazy  . Verapamil     Caused "pounding " in chest    Current Outpatient Prescriptions  Medication Sig Dispense Refill  . ALPRAZolam (XANAX) 0.5 MG tablet Take 0.25-0.5 mg by mouth 2 (two) times daily as needed. For anxiety      . buPROPion (WELLBUTRIN) 100 MG tablet Take 3 tablets (300 mg total) by mouth daily.  90 tablet  5  . cyclobenzaprine (FLEXERIL) 10 MG tablet Take 1 tablet (10 mg total) by mouth 2 (two) times daily as needed for muscle spasms.  20 tablet  0  . dexlansoprazole (DEXILANT) 60 MG capsule Take 1 capsule (60 mg total) by mouth daily.  30 capsule  5  . hyoscyamine (LEVSIN SL) 0.125 MG SL tablet Place 1 tablet (0.125 mg total) under the tongue every 4  (four) hours as needed.  30 tablet  5  . ibuprofen (ADVIL,MOTRIN) 800 MG tablet Take 1 tablet (800 mg total) by mouth 3 (three) times daily.  21 tablet  0  . ketoconazole (NIZORAL) 2 % cream Apply topically 2 (two) times daily. Apply bid & blow dry  15 g  1  . lamoTRIgine (LAMICTAL) 150 MG tablet Take 2 tablets (300 mg total) by mouth at bedtime. For bi polar disorder  60 tablet  5  . levothyroxine (SYNTHROID, LEVOTHROID) 100 MCG tablet Take 1 tablet (100 mcg total) by mouth daily.  90 tablet  1  . metoprolol tartrate (LOPRESSOR) 25 MG tablet Take 2 tablets (50 mg total) by mouth 2 (two) times daily.  180 tablet  1  . prochlorperazine (COMPAZINE) 10 MG tablet Take 1 tablet (10 mg total) by mouth 2 (two) times daily as needed. Take with Benadryl 25mg .  20 tablet  0  . promethazine (PHENERGAN) 12.5 MG tablet Take 1 tablet (12.5 mg total) by mouth every 6 (six) hours as needed for nausea or vomiting.  30 tablet  0  . spironolactone (ALDACTONE) 25 MG tablet Take 1 tablet (25 mg total) by mouth daily.  90 tablet  1  . topiramate (TOPAMAX) 25 MG tablet TAKE 1 TABLET BY MOUTH TWICE DAILY  60 tablet  2  . traMADol (ULTRAM) 50 MG tablet Take 1 tablet (50 mg total) by mouth every 6 (six) hours as needed for pain.  30 tablet  1  . verapamil (CALAN) 120 MG tablet       . Lurasidone HCl (LATUDA) 60 MG TABS 1 po qd  30 tablet  5  . ondansetron (ZOFRAN ODT) 8 MG disintegrating tablet Take 1 tablet (8 mg total) by mouth every 8 (eight) hours as needed for nausea or vomiting.  20 tablet  0  . potassium chloride SA (K-DUR,KLOR-CON) 20 MEQ tablet 2 by mouth daily  180 tablet  1   No current facility-administered medications for this visit.    History   Social History  . Marital Status: Divorced    Spouse Name: N/A    Number of Children: N/A  . Years of Education: N/A   Occupational History  . [employer]    Social History Main Topics  . Smoking status: Never Smoker   . Smokeless tobacco: Never Used  .  Alcohol Use: Yes     Comment: occ  . Drug Use: No  . Sexual Activity: Not on file   Other Topics Concern  . Not on file   Social History Narrative   Divorced, has boyfriend   1 daughter   Patient accounts at [employer]   1 caffeinated beverage/day             Family History  Problem Relation Age of Onset  . Coronary artery disease Mother   . Stroke Mother     TIA  . Uterine cancer Mother     BREAST  . Lung cancer Father     LUNG  . Heart disease Father   . Colon cancer Paternal Uncle     COLON; STOMACH  . COPD Father   . Thyroid disease Mother     goiter  . Lung cancer Maternal Grandfather   . Coronary artery disease Father   . Rheum arthritis Maternal Aunt   . Supraventricular tachycardia Daughter     S/P ablation    Past Medical History  Diagnosis Date  . IBS (irritable bowel syndrome)   . Anxiety   . Depression   . Hypothyroidism     hypothyroidism  . Migraine   . Chest pain     Emergency room April 23, 2012, troponin is normal, chest CT scan showed no pulmonary embolus  . Cough     Chronic cough  . Ejection fraction     EF 55-60%, echo, December, 2013, lipomatous hypertrophy of the atrial septum  . GERD (gastroesophageal reflux disease)   . Personal history of failed moderate sedation 06/20/2012  . Personal history of colonic adenoma 06/28/2012    Past Surgical History  Procedure Laterality Date  . Cholecystectomy    . Dilation and curettage of uterus      x3  . Knee surgery      x2  . Wrist surgery      x3  . Laparoscopy  06/03/1990    to evaluate dysfunctional menses & pelvic pain  . Breast lumpectomy      left  . Cystocele repair  01/13/09  . Upper gastrointestinal endoscopy  2004    esophageal dilation  . Colonoscopy  2002  . Mastectomy, partial      benign fibrocystic changes  . Elbow surgery  2005    left    Patient Active Problem List   Diagnosis Date  Noted  . Palpitations 05/11/2013  . Obesity (BMI 30-39.9) 04/11/2013  .  Personal history of colonic adenoma 06/28/2012  . Personal history of failed moderate sedation 06/20/2012  . [name] 06/19/2012  . Ejection fraction   . HTN (hypertension) 05/04/2012  . IBS (irritable bowel syndrome)   . Anxiety   . Depression   . Hypothyroidism   . Migraine   . Chest pain   . Chronic cough 09/10/2011  . Hallucinations 05/14/2010  . HEADACHE 05/14/2010  . B12 DEFICIENCY 02/17/2010  . BIPOLAR DISORDER UNSPECIFIED 10/23/2009  . HYPOKALEMIA 05/20/2009  . ASTERIXIS 12/20/2006  . HYPERLIPIDEMIA NEC/NOS 10/27/2006  . FASTING HYPERGLYCEMIA 10/27/2006    ROS   Patient denies fever, chills, headache, sweats, rash, change in vision, change in hearing, chest pain, cough, nausea vomiting, urinary symptoms. All other systems are reviewed and are negative.  PHYSICAL EXAM  Patient is oriented to person time and place. There is no jugulovenous distention. Lungs are clear. Respiratory effort is nonlabored. Cardiac exam reveals S1 and S2. There no clicks or significant murmurs. Abdomen is soft. There is no peripheral edema. Rhythm is regular.  Filed Vitals:   06/08/13 0913  BP: 148/86  Pulse: 84  Height: 5\' 4"  (1.626 m)  Weight: 188 lb (85.276 kg)  SpO2: 97%     ASSESSMENT & PLAN

## 2013-07-25 ENCOUNTER — Encounter: Payer: Self-pay | Admitting: Nurse Practitioner

## 2013-07-25 ENCOUNTER — Ambulatory Visit (INDEPENDENT_AMBULATORY_CARE_PROVIDER_SITE_OTHER): Payer: BC Managed Care – PPO | Admitting: Nurse Practitioner

## 2013-07-25 ENCOUNTER — Encounter: Payer: Self-pay | Admitting: *Deleted

## 2013-07-25 VITALS — BP 173/96 | HR 70 | Temp 97.9°F | Ht 64.0 in | Wt 186.2 lb

## 2013-07-25 DIAGNOSIS — R1031 Right lower quadrant pain: Secondary | ICD-10-CM

## 2013-07-25 LAB — COMPREHENSIVE METABOLIC PANEL
ALT: 21 U/L (ref 0–35)
AST: 22 U/L (ref 0–37)
Albumin: 4.3 g/dL (ref 3.5–5.2)
Alkaline Phosphatase: 84 U/L (ref 39–117)
BUN: 8 mg/dL (ref 6–23)
CO2: 27 mEq/L (ref 19–32)
Calcium: 9.4 mg/dL (ref 8.4–10.5)
Chloride: 103 mEq/L (ref 96–112)
Creatinine, Ser: 1.1 mg/dL (ref 0.4–1.2)
GFR: 57.1 mL/min — ABNORMAL LOW (ref >60.00)
Glucose, Bld: 89 mg/dL (ref 70–99)
Potassium: 3.7 mEq/L (ref 3.5–5.1)
Sodium: 139 mEq/L (ref 135–145)
Total Bilirubin: 0.3 mg/dL (ref 0.3–1.2)
Total Protein: 7.5 g/dL (ref 6.0–8.3)

## 2013-07-25 LAB — CBC
HCT: 42.1 % (ref 36.0–46.0)
Hemoglobin: 13.9 g/dL (ref 12.0–15.0)
MCHC: 33 g/dL (ref 30.0–36.0)
MCV: 93.8 fl (ref 78.0–100.0)
Platelets: 194 10*3/uL (ref 150.0–400.0)
RBC: 4.49 Mil/uL (ref 3.87–5.11)
RDW: 13.9 % (ref 11.5–14.6)
WBC: 6.4 10*3/uL (ref 4.5–10.5)

## 2013-07-25 LAB — POCT URINALYSIS DIPSTICK
Bilirubin, UA: NEGATIVE
Blood, UA: POSITIVE — AB
Glucose, UA: NEGATIVE
Ketones, UA: NEGATIVE
Leukocytes, UA: NEGATIVE
Nitrite, UA: NEGATIVE
Protein, UA: NEGATIVE
Specific Gravity, UA: 1.005 (ref 1.005–1.030)
Urobilinogen, UA: NORMAL
pH, UA: 6.5 (ref 4.5–8.0)

## 2013-07-25 NOTE — Progress Notes (Signed)
Pre visit review using our clinic review tool, if applicable. No additional management support is needed unless otherwise documented below in the visit note. 

## 2013-07-25 NOTE — Progress Notes (Signed)
Subjective:     SARAHELIZABETH CONWAY is a 58 y.o. female who presents for evaluation of RLQ abdominal pain X 2-3 weeks, chronic nausea that started 2 mos ago, diarrhea alternating with constipation, and green stools, alternating with brown in same stool, 1 episode chills last week.  Symptoms have been stable. The pain is described as aching, and is 4/10 in intensity.  Aggravating factors: none.  Alleviating factors: phenergan helps nausea. She denies belching, flatus, dysuria, flank pain. Recent elevation of ALT, hx hiatal hernia. Pt denies use of tylenol or ETOH.  The patient's history has been marked as reviewed and updated as appropriate.  Review of Systems Pertinent items are noted in HPI.     Objective:    BP 173/96  Pulse 70  Temp(Src) 97.9 F (36.6 C) (Oral)  Ht 5\' 4"  (1.626 m)  Wt 186 lb 3.2 oz (84.46 kg)  BMI 31.95 kg/m2  SpO2 96% General appearance: alert, cooperative, appears stated age and no distress Head: Normocephalic, without obvious abnormality, atraumatic Eyes: negative findings: lids and lashes normal and conjunctivae and sclerae normal Lungs: clear to auscultation bilaterally Heart: regular rate and rhythm, S1, S2 normal, no murmur, click, rub or gallop Abdomen: normal findings: no bruits heard, no masses palpable, no organomegaly, spleen non-palpable, symmetric and umbilicus normal and abnormal findings:  tender RLQ    Assessment:    Abdominal pain, likely secondary to IBS DD: appendicitis, diverticulitis Chronic nausea? Green stools?     Plan:   CMET, CBC, abd [place] Start probiotics. F/u 2 weeks. Consider GI referral if all tests nml.

## 2013-07-25 NOTE — Patient Instructions (Signed)
This could be diverticulitis, irritable bowel syndrome, appendicitis. Labs & ultrasound will help make diagnosis. Start probiotic: alternate culterelle & align daily.    Abdominal Pain, Adult Many things can cause abdominal pain. Usually, abdominal pain is not caused by a disease and will improve without treatment. It can often be observed and treated at home. Your health care provider will do a physical exam and possibly order blood tests and X-rays to help determine the seriousness of your pain. However, in many cases, more time must pass before a clear cause of the pain can be found. Before that point, your health care provider may not know if you need more testing or further treatment. HOME CARE INSTRUCTIONS  Monitor your abdominal pain for any changes. The following actions may help to alleviate any discomfort you are experiencing:  Only take over-the-counter or prescription medicines as directed by your health care provider.  Do not take laxatives unless directed to do so by your health care provider.  Try a clear liquid diet (broth, tea, or water) as directed by your health care provider. Slowly move to a bland diet as tolerated. SEEK MEDICAL CARE IF:  You have unexplained abdominal pain.  You have abdominal pain associated with nausea or diarrhea.  You have pain when you urinate or have a bowel movement.  You experience abdominal pain that wakes you in the night.  You have abdominal pain that is worsened or improved by eating food.  You have abdominal pain that is worsened with eating fatty foods. SEEK IMMEDIATE MEDICAL CARE IF:   Your pain does not go away within 2 hours.  You have a fever.  You keep throwing up (vomiting).  Your pain is felt only in portions of the abdomen, such as the right side or the left lower portion of the abdomen.  You pass bloody or black tarry stools. MAKE SURE YOU:  Understand these instructions.   Will watch your condition.   Will get  help right away if you are not doing well or get worse.  Document Released: 01/27/2005 Document Revised: 02/07/2013 Document Reviewed: 12/27/2012 [facility] Patient Information 2014 [place].

## 2013-07-26 ENCOUNTER — Other Ambulatory Visit: Payer: Self-pay | Admitting: Nurse Practitioner

## 2013-07-26 ENCOUNTER — Telehealth: Payer: Self-pay | Admitting: Family Medicine

## 2013-07-26 ENCOUNTER — Ambulatory Visit (HOSPITAL_BASED_OUTPATIENT_CLINIC_OR_DEPARTMENT_OTHER)
Admission: RE | Admit: 2013-07-26 | Discharge: 2013-07-26 | Disposition: A | Discharge status: Home / Self Care | Payer: BC Managed Care – PPO | Source: Ambulatory Visit | Attending: Nurse Practitioner | Admitting: Nurse Practitioner

## 2013-07-26 DIAGNOSIS — R1031 Right lower quadrant pain: Secondary | ICD-10-CM

## 2013-07-26 DIAGNOSIS — R110 Nausea: Secondary | ICD-10-CM

## 2013-07-26 LAB — URINE CULTURE
Colony Count: NO GROWTH
Organism ID, Bacteria: NO GROWTH

## 2013-07-26 NOTE — Progress Notes (Signed)
Okay, I will call and schedule her. We will also need order for US transvaginal non-ob to go along with the pelvic US.

## 2013-07-26 NOTE — Telephone Encounter (Signed)
Patient called and stated that she forgot that she had Mesh Surgery and they put a right stent in her ureter. Patient is not sure if that's were the pain is coming from.  Please advise.

## 2013-07-26 NOTE — Progress Notes (Signed)
Changed location for [place] as [facility] will not allow imaging of appendix w/ [place], per [place] tech. Placed new orders for GI. Added pelvic US.

## 2013-07-27 ENCOUNTER — Ambulatory Visit
Admission: RE | Admit: 2013-07-27 | Discharge: 2013-07-27 | Disposition: A | Discharge status: Home / Self Care | Payer: BC Managed Care – PPO | Source: Ambulatory Visit | Attending: Nurse Practitioner | Admitting: Nurse Practitioner

## 2013-07-27 ENCOUNTER — Other Ambulatory Visit: Payer: BC Managed Care – PPO

## 2013-07-27 DIAGNOSIS — R110 Nausea: Secondary | ICD-10-CM

## 2013-07-27 DIAGNOSIS — R1031 Right lower quadrant pain: Secondary | ICD-10-CM

## 2013-07-27 NOTE — Telephone Encounter (Signed)
No findings on abd, pelvic, Or transvag US to explain RLQ pain. Will refer to urology, given Hx of R ureter stent & bladder mesh, per pt.

## 2013-07-27 NOTE — Telephone Encounter (Signed)
pls call pt: Stent could be causing pain. [place] should show placement.

## 2013-07-30 ENCOUNTER — Other Ambulatory Visit: Payer: Self-pay | Admitting: Neurosurgery

## 2013-07-30 DIAGNOSIS — M5416 Radiculopathy, lumbar region: Secondary | ICD-10-CM

## 2013-08-01 ENCOUNTER — Emergency Department (HOSPITAL_COMMUNITY): Payer: BC Managed Care – PPO

## 2013-08-01 ENCOUNTER — Observation Stay (HOSPITAL_COMMUNITY)
Admission: EM | Admit: 2013-08-01 | Discharge: 2013-08-02 | Disposition: A | Discharge status: Home / Self Care | Payer: Self-pay | Attending: Cardiology | Admitting: Cardiology

## 2013-08-01 ENCOUNTER — Encounter (HOSPITAL_COMMUNITY): Payer: Self-pay | Admitting: Emergency Medicine

## 2013-08-01 DIAGNOSIS — K589 Irritable bowel syndrome without diarrhea: Secondary | ICD-10-CM | POA: Diagnosis present

## 2013-08-01 DIAGNOSIS — Z860100 Personal history of colon polyps, unspecified: Secondary | ICD-10-CM | POA: Insufficient documentation

## 2013-08-01 DIAGNOSIS — R55 Syncope and collapse: Secondary | ICD-10-CM | POA: Diagnosis present

## 2013-08-01 DIAGNOSIS — G43909 Migraine, unspecified, not intractable, without status migrainosus: Secondary | ICD-10-CM | POA: Insufficient documentation

## 2013-08-01 DIAGNOSIS — K219 Gastro-esophageal reflux disease without esophagitis: Secondary | ICD-10-CM | POA: Insufficient documentation

## 2013-08-01 DIAGNOSIS — R05 Cough: Secondary | ICD-10-CM | POA: Diagnosis present

## 2013-08-01 DIAGNOSIS — R053 Chronic cough: Secondary | ICD-10-CM | POA: Diagnosis present

## 2013-08-01 DIAGNOSIS — R079 Chest pain, unspecified: Secondary | ICD-10-CM

## 2013-08-01 DIAGNOSIS — E785 Hyperlipidemia, unspecified: Secondary | ICD-10-CM | POA: Diagnosis present

## 2013-08-01 DIAGNOSIS — F419 Anxiety disorder, unspecified: Secondary | ICD-10-CM | POA: Diagnosis present

## 2013-08-01 DIAGNOSIS — F411 Generalized anxiety disorder: Secondary | ICD-10-CM | POA: Insufficient documentation

## 2013-08-01 DIAGNOSIS — R072 Precordial pain: Principal | ICD-10-CM | POA: Diagnosis present

## 2013-08-01 DIAGNOSIS — F319 Bipolar disorder, unspecified: Secondary | ICD-10-CM | POA: Insufficient documentation

## 2013-08-01 DIAGNOSIS — R059 Cough, unspecified: Secondary | ICD-10-CM | POA: Insufficient documentation

## 2013-08-01 DIAGNOSIS — E039 Hypothyroidism, unspecified: Secondary | ICD-10-CM | POA: Insufficient documentation

## 2013-08-01 HISTORY — DX: Adverse effect of unspecified anesthetic, initial encounter: T41.45XA

## 2013-08-01 HISTORY — DX: Other intervertebral disc degeneration, lumbar region without mention of lumbar back pain or lower extremity pain: M51.369

## 2013-08-01 HISTORY — DX: Pneumonia, unspecified organism: J18.9

## 2013-08-01 HISTORY — DX: Bipolar disorder, unspecified: F31.9

## 2013-08-01 HISTORY — DX: Other intervertebral disc degeneration, lumbar region: M51.36

## 2013-08-01 HISTORY — DX: Other complications of anesthesia, initial encounter: T88.59XA

## 2013-08-01 HISTORY — DX: Other intervertebral disc displacement, lumbar region: M51.26

## 2013-08-01 HISTORY — DX: Family history of other specified conditions: Z84.89

## 2013-08-01 LAB — MAGNESIUM: Magnesium: 2 mg/dL (ref 1.5–2.5)

## 2013-08-01 LAB — I-STAT TROPONIN, ED
Troponin i, poc: 0 ng/mL (ref 0.00–0.08)
Troponin i, poc: 0 ng/mL (ref 0.00–0.08)

## 2013-08-01 LAB — CBC WITH DIFFERENTIAL/PLATELET
Basophils Absolute: 0 10*3/uL (ref 0.0–0.1)
Basophils Relative: 0 % (ref 0–1)
Eosinophils Absolute: 0.1 10*3/uL (ref 0.0–0.7)
Eosinophils Relative: 2 % (ref 0–5)
HCT: 41.4 % (ref 36.0–46.0)
Hemoglobin: 14.4 g/dL (ref 12.0–15.0)
Lymphocytes Relative: 29 % (ref 12–46)
Lymphs Abs: 1.8 10*3/uL (ref 0.7–4.0)
MCH: 32.1 pg (ref 26.0–34.0)
MCHC: 34.8 g/dL (ref 30.0–36.0)
MCV: 92.2 fL (ref 78.0–100.0)
Monocytes Absolute: 0.6 10*3/uL (ref 0.1–1.0)
Monocytes Relative: 9 % (ref 3–12)
Neutro Abs: 3.7 10*3/uL (ref 1.7–7.7)
Neutrophils Relative %: 60 % (ref 43–77)
Platelets: 179 10*3/uL (ref 150–400)
RBC: 4.49 MIL/uL (ref 3.87–5.11)
RDW: 13.3 % (ref 11.5–15.5)
WBC: 6.3 10*3/uL (ref 4.0–10.5)

## 2013-08-01 LAB — T4, FREE: Free T4: 1.21 ng/dL (ref 0.80–1.80)

## 2013-08-01 LAB — COMPREHENSIVE METABOLIC PANEL
ALT: 18 U/L (ref 0–35)
AST: 18 U/L (ref 0–37)
Albumin: 3.9 g/dL (ref 3.5–5.2)
Alkaline Phosphatase: 103 U/L (ref 39–117)
BUN: 14 mg/dL (ref 6–23)
CO2: 25 mEq/L (ref 19–32)
Calcium: 9.5 mg/dL (ref 8.4–10.5)
Chloride: 102 mEq/L (ref 96–112)
Creatinine, Ser: 0.99 mg/dL (ref 0.50–1.10)
GFR calc Af Amer: 73 mL/min — ABNORMAL LOW (ref >90)
GFR calc non Af Amer: 63 mL/min — ABNORMAL LOW (ref >90)
Glucose, Bld: 90 mg/dL (ref 70–99)
Potassium: 3.7 mEq/L (ref 3.7–5.3)
Sodium: 141 mEq/L (ref 137–147)
Total Bilirubin: 0.3 mg/dL (ref 0.3–1.2)
Total Protein: 7.8 g/dL (ref 6.0–8.3)

## 2013-08-01 LAB — URINALYSIS, ROUTINE W REFLEX MICROSCOPIC
Bilirubin Urine: NEGATIVE
Glucose, UA: NEGATIVE mg/dL
Hgb urine dipstick: NEGATIVE
Ketones, ur: NEGATIVE mg/dL
Leukocytes, UA: NEGATIVE
Nitrite: NEGATIVE
Protein, ur: NEGATIVE mg/dL
Specific Gravity, Urine: 1.011 (ref 1.005–1.030)
Urobilinogen, UA: 0.2 mg/dL (ref 0.0–1.0)
pH: 6.5 (ref 5.0–8.0)

## 2013-08-01 LAB — CBC
HCT: 36.6 % (ref 36.0–46.0)
Hemoglobin: 12.3 g/dL (ref 12.0–15.0)
MCH: 31.3 pg (ref 26.0–34.0)
MCHC: 33.6 g/dL (ref 30.0–36.0)
MCV: 93.1 fL (ref 78.0–100.0)
Platelets: 164 10*3/uL (ref 150–400)
RBC: 3.93 MIL/uL (ref 3.87–5.11)
RDW: 13.4 % (ref 11.5–15.5)
WBC: 5.4 10*3/uL (ref 4.0–10.5)

## 2013-08-01 LAB — APTT: aPTT: 30 seconds (ref 24–37)

## 2013-08-01 LAB — LIPASE, BLOOD: Lipase: 28 U/L (ref 11–59)

## 2013-08-01 LAB — TSH: TSH: 1.77 u[IU]/mL (ref 0.350–4.500)

## 2013-08-01 LAB — PRO B NATRIURETIC PEPTIDE: Pro B Natriuretic peptide (BNP): 64.3 pg/mL (ref 0–125)

## 2013-08-01 LAB — CREATININE, SERUM
Creatinine, Ser: 0.99 mg/dL (ref 0.50–1.10)
GFR calc Af Amer: 73 mL/min — ABNORMAL LOW (ref >90)
GFR calc non Af Amer: 63 mL/min — ABNORMAL LOW (ref >90)

## 2013-08-01 LAB — PROTIME-INR
INR: 1.05 (ref 0.00–1.49)
Prothrombin Time: 13.5 seconds (ref 11.6–15.2)

## 2013-08-01 LAB — D-DIMER, QUANTITATIVE (NOT AT ARMC): D-Dimer, Quant: 0.29 ug/mL-FEU (ref 0.00–0.48)

## 2013-08-01 LAB — HEMOGLOBIN A1C
Hgb A1c MFr Bld: 5.6 % (ref <5.7)
Mean Plasma Glucose: 114 mg/dL (ref <117)

## 2013-08-01 LAB — GLUCOSE, CAPILLARY: Glucose-Capillary: 84 mg/dL (ref 70–99)

## 2013-08-01 LAB — TROPONIN I
Troponin I: <0.3 ng/mL (ref <0.30)
Troponin I: <0.3 ng/mL (ref <0.30)

## 2013-08-01 MED ORDER — PROMETHAZINE HCL 25 MG PO TABS
12.5000 mg | ORAL_TABLET | Freq: Four times a day (QID) | ORAL | Status: DC | PRN
  Administered 2013-08-02: 12.5 mg via ORAL
  Filled 2013-08-01: qty 1

## 2013-08-01 MED ORDER — ASPIRIN 300 MG RE SUPP
300.0000 mg | RECTAL | Status: AC
  Filled 2013-08-01: qty 1

## 2013-08-01 MED ORDER — LORAZEPAM 1 MG PO TABS
0.5000 mg | ORAL_TABLET | Freq: Once | ORAL | Status: AC
Start: 2013-08-01 — End: 2013-08-01
  Administered 2013-08-01: 0.5 mg via ORAL
  Filled 2013-08-01: qty 1

## 2013-08-01 MED ORDER — NITROGLYCERIN 0.4 MG SL SUBL: 0.4000 mg | SUBLINGUAL_TABLET | SUBLINGUAL | Status: DC | PRN

## 2013-08-01 MED ORDER — ASPIRIN 81 MG PO CHEW
324.0000 mg | CHEWABLE_TABLET | Freq: Once | ORAL | Status: AC
  Administered 2013-08-01: 324 mg via ORAL
  Filled 2013-08-01: qty 4

## 2013-08-01 MED ORDER — NITROGLYCERIN 2 % TD OINT
1.0000 [in_us] | TOPICAL_OINTMENT | Freq: Once | TRANSDERMAL | Status: AC
  Administered 2013-08-01: 1 [in_us] via TOPICAL
  Filled 2013-08-01: qty 1

## 2013-08-01 MED ORDER — BUPROPION HCL 100 MG PO TABS
100.0000 mg | ORAL_TABLET | Freq: Every day | ORAL | Status: DC
  Administered 2013-08-01: 100 mg via ORAL
  Filled 2013-08-01 (×2): qty 1

## 2013-08-01 MED ORDER — ONDANSETRON HCL 4 MG/2ML IJ SOLN
4.0000 mg | Freq: Once | INTRAMUSCULAR | Status: AC
  Administered 2013-08-01: 4 mg via INTRAVENOUS
  Filled 2013-08-01: qty 2

## 2013-08-01 MED ORDER — ASPIRIN EC 81 MG PO TBEC
81.0000 mg | DELAYED_RELEASE_TABLET | Freq: Every day | ORAL | Status: DC
  Administered 2013-08-02: 81 mg via ORAL
  Filled 2013-08-01: qty 1

## 2013-08-01 MED ORDER — SODIUM CHLORIDE 0.9 % IV SOLN
INTRAVENOUS | Status: DC
  Administered 2013-08-01: 08:00:00 via INTRAVENOUS

## 2013-08-01 MED ORDER — ALPRAZOLAM 0.5 MG PO TABS
0.5000 mg | ORAL_TABLET | Freq: Three times a day (TID) | ORAL | Status: DC | PRN
  Administered 2013-08-01: 0.5 mg via ORAL
  Filled 2013-08-01: qty 1

## 2013-08-01 MED ORDER — SODIUM CHLORIDE 0.9 % IV SOLN
INTRAVENOUS | Status: AC
  Administered 2013-08-01 – 2013-08-02 (×2): via INTRAVENOUS

## 2013-08-01 MED ORDER — TRAMADOL HCL 50 MG PO TABS
100.0000 mg | ORAL_TABLET | Freq: Four times a day (QID) | ORAL | Status: DC | PRN
  Administered 2013-08-01 – 2013-08-02 (×2): 100 mg via ORAL
  Filled 2013-08-01 (×2): qty 2

## 2013-08-01 MED ORDER — ASPIRIN 81 MG PO CHEW
324.0000 mg | CHEWABLE_TABLET | ORAL | Status: AC
  Administered 2013-08-01: 324 mg via ORAL
  Filled 2013-08-01: qty 4

## 2013-08-01 MED ORDER — LAMOTRIGINE 150 MG PO TABS
150.0000 mg | ORAL_TABLET | Freq: Every day | ORAL | Status: DC
  Administered 2013-08-01: 150 mg via ORAL
  Filled 2013-08-01 (×2): qty 1

## 2013-08-01 MED ORDER — LEVOTHYROXINE SODIUM 100 MCG PO TABS
100.0000 ug | ORAL_TABLET | Freq: Every day | ORAL | Status: DC
  Administered 2013-08-01: 100 ug via ORAL
  Filled 2013-08-01 (×3): qty 1

## 2013-08-01 MED ORDER — HEPARIN SODIUM (PORCINE) 5000 UNIT/ML IJ SOLN
5000.0000 [IU] | Freq: Three times a day (TID) | INTRAMUSCULAR | Status: DC
  Administered 2013-08-01 (×2): 5000 [IU] via SUBCUTANEOUS
  Filled 2013-08-01 (×6): qty 1

## 2013-08-01 MED ORDER — MORPHINE SULFATE 2 MG/ML IJ SOLN
2.0000 mg | Freq: Once | INTRAMUSCULAR | Status: AC
  Administered 2013-08-01: 2 mg via INTRAVENOUS
  Filled 2013-08-01: qty 1

## 2013-08-01 MED ORDER — SODIUM CHLORIDE 0.9 % IV BOLUS (SEPSIS)
500.0000 mL | Freq: Once | INTRAVENOUS | Status: AC
  Administered 2013-08-01: 500 mL via INTRAVENOUS

## 2013-08-01 MED ORDER — METOPROLOL TARTRATE 12.5 MG HALF TABLET
12.5000 mg | ORAL_TABLET | Freq: Two times a day (BID) | ORAL | Status: DC
  Administered 2013-08-01 – 2013-08-02 (×3): 12.5 mg via ORAL
  Filled 2013-08-01 (×4): qty 1

## 2013-08-01 NOTE — ED Notes (Signed)
Pt reports syncompe episode 5:30am.  Denies hitting her head, but reports falling on her knee. Pt reports hx of syncope.  Last episode 43yr ago.  Reports CP started after syncope.  Rates CP about a 7/10, left chest, described as dully and achey, but sharp upon inspiration.  Denies radiation.  States she had CP before December 2013 accompanied by high BP and pulse.  Sent to cardiologist.  Last went to cardiologist couple months ago because BP and pulse increase.  Pt reports nausea.  Denies SOB.  No acute distress, respirations equal and unlabored, skin warm and dry.

## 2013-08-01 NOTE — ED Notes (Signed)
Husband at patient's side, witnessed the event.

## 2013-08-01 NOTE — ED Notes (Addendum)
Pt ambulated to the restroom and on the way back started to have central left upper CP, rating at 7/10, describes it as "cutting feeling". VSS. Pt skin warm and dry, resp e/u. Pt requesting anti-anxiety medicine, sts she is feeling very anxious. MD notified.

## 2013-08-01 NOTE — H&P (Addendum)
Admit date: 08/01/2013 Primary Physician  Garnet Koyanagi, DO Primary Cardiologist  Dr. Ron Parker  CC: Syncope, chest pain  HPI: 58 year-old female with prior cardiovascular workup in 2013 unremarkable nuclear stress, echocardiogram by Dr. Ron Parker who presented to the emergency department after a syncopal episode this morning with subsequent chest pain. She states that she was getting up out of bed to go to the bathroom and doesn't remember what happened after that. Her boyfriend helped her up off of the floor and noted that she was cold and somewhat pale. After this episode, she began to feel substernal chest discomfort with no radiation. Her boyfriend I believe works for EMS and prompted her to seek attention. In the emergency department, her chest pain rapidly subsided after giving nitroglycerin. Currently with nitro paste.  Previously, over the past several weeks she has been experiencing chronic nausea. She states that sometimes it is difficult for her to keep food down without Phenergan. She also at times has had right lower cautery and abdominal discomfort where her "bladder mesh " and prior "kidney stent"was in place. She has had abdominal ultrasounds which have been unremarkable.    PMH:   Past Medical History  Diagnosis Date  . IBS (irritable bowel syndrome)   . Anxiety   . Depression   . Hypothyroidism     hypothyroidism  . Migraine   . Chest pain     Emergency room April 23, 2012, troponin is normal, chest CT scan showed no pulmonary embolus  . Cough     Chronic cough  . Ejection fraction     EF 55-60%, echo, December, 2013, lipomatous hypertrophy of the atrial septum  . GERD (gastroesophageal reflux disease)   . Personal history of failed moderate sedation 06/20/2012  . Personal history of colonic adenoma 06/28/2012    PSH:   Past Surgical History  Procedure Laterality Date  . Cholecystectomy    . Dilation and curettage of uterus      x3  . Knee surgery      x2  . Wrist  surgery      x3  . Laparoscopy  06/03/1990    to evaluate dysfunctional menses & pelvic pain  . Breast lumpectomy      left  . Cystocele repair  01/13/09  . Upper gastrointestinal endoscopy  2004    esophageal dilation  . Colonoscopy  2002  . Mastectomy, partial      benign fibrocystic changes  . Elbow surgery  2005    left   Allergies:  Amoxicillin; Benzonatate; Fentanyl; Midazolam; Oxycodone-aspirin; Sulfonamide derivatives; Hydrocodone; Nalbuphine; Oxycodone-acetaminophen; Venlafaxine; and Verapamil Prior to Admit Meds:   Prior to Admission medications   Medication Sig Start Date End Date Taking? Authorizing Provider  buPROPion (WELLBUTRIN) 100 MG tablet Take 100 mg by mouth at bedtime.   Yes Historical Provider, MD  ibuprofen (ADVIL,MOTRIN) 800 MG tablet Take 1 tablet (800 mg total) by mouth 3 (three) times daily. 08/17/12  Yes Rosalita Chessman, DO  lamoTRIgine (LAMICTAL) 150 MG tablet Take 150 mg by mouth at bedtime.   Yes Historical Provider, MD  levothyroxine (SYNTHROID, LEVOTHROID) 100 MCG tablet Take 1 tablet (100 mcg total) by mouth daily. 04/11/13  Yes Yvonne R Lowne, DO  promethazine (PHENERGAN) 12.5 MG tablet Take 1 tablet (12.5 mg total) by mouth every 6 (six) hours as needed for nausea or vomiting. 05/05/13  Yes Hope Bunnie Pion, NP  traMADol (ULTRAM) 50 MG tablet Take 1 tablet (50 mg total)  by mouth every 6 (six) hours as needed for pain. 08/17/12  Yes Rosalita Chessman, DO   Fam HX:    Family History  Problem Relation Age of Onset  . Coronary artery disease Mother   . Stroke Mother     TIA  . Uterine cancer Mother     BREAST  . Lung cancer Father     LUNG  . Heart disease Father   . Colon cancer Paternal Uncle     COLON; STOMACH  . COPD Father   . Thyroid disease Mother     goiter  . Lung cancer Maternal Grandfather   . Coronary artery disease Father   . Rheum arthritis Maternal Aunt   . Supraventricular tachycardia Daughter     S/P ablation   Social HX:    History     Social History  . Marital Status: Divorced    Spouse Name: N/A    Number of Children: N/A  . Years of Education: N/A   Occupational History  . [employer]    Social History Main Topics  . Smoking status: Never Smoker   . Smokeless tobacco: Never Used  . Alcohol Use: Yes     Comment: occ  . Drug Use: No  . Sexual Activity: Not on file   Other Topics Concern  . Not on file   Social History Narrative   Divorced, has boyfriend   1 daughter   Patient accounts at [employer]   1 caffeinated beverage/day              ROS:   Denies any fevers, chills, cough. She has remained cold at times. [place] and she was shivering. Chronic nausea, chronic abdominal discomfort at times. All 11 ROS were addressed and are negative except what is stated in the HPI   Physical Exam: Blood pressure 130/73, pulse 63, temperature 98 F (36.7 C), temperature source Oral, resp. rate 12, SpO2 97.00%.   General: Well developed, well nourished, in no acute distress Head: Eyes PERRLA, No xanthomas.   Normal cephalic and atramatic  Lungs:  Clear bilaterally to auscultation and percussion. Normal respiratory effort. No wheezes, no rales. Heart:  HRRR S1 S2 Pulses are 2+ & equal. No murmurs, rubs or gallops.             No carotid bruit. No JVD.  No abdominal bruits. Abdomen: Bowel sounds are positive, abdomen soft and non-tender without masses or                 Hernia's noted. No hepatosplenomegaly. Msk:  Back normal, normal gait. Normal strength and tone for age. Extremities:  No clubbing, cyanosis or edema.  DP +1 Neuro: Alert and oriented X 3, non-focal, MAE x 4 GU: Deferred Rectal: Deferred Psych:  Good affect, responds appropriately, mildly anxious appearing         Labs:   Lab Results  Component Value Date   WBC 6.3 08/01/2013   HGB 14.4 08/01/2013   HCT 41.4 08/01/2013   MCV 92.2 08/01/2013   PLT 179 08/01/2013    Recent  Labs Lab 08/01/13 0810  NA 141  K 3.7  CL 102  CO2 25  BUN 14  CREATININE 0.99  CALCIUM 9.5  PROT 7.8  BILITOT 0.3  ALKPHOS 103  ALT 18  AST 18  GLUCOSE 90   No results found for this basename: CKTOTAL,  CKMB, TROPONINI,  in the last 72 hours Lab Results  Component Value Date   CHOL 187 04/11/2013   HDL 47.60 04/11/2013   LDLCALC 117* 04/11/2013   TRIG 110.0 04/11/2013   Lab Results  Component Value Date   DDIMER 0.29 08/01/2013     Radiology:  Dg Chest 2 View  08/01/2013   CLINICAL DATA:  Chest pain  EXAM: CHEST  2 VIEW  COMPARISON:  Chest CT April 24, 2012 and chest radiograph April 23, 2012  FINDINGS: Lungs are clear. Heart size and pulmonary vascularity are normal. No adenopathy. No pneumothorax. No bone lesions. There is a safety pin located anteriorly on the lateral view over the upper abdomen. This finding is not seen on the frontal view and presumably overlies the patient.  IMPRESSION: Safety pin which presumably overlies the patient on the lateral view. Clinical assessment advised to confirm that the safety pin indeed overlying the patient. Lungs are clear.   Electronically Signed   By: Lowella Grip M.D.   On: 08/01/2013 07:50   Ct Head Wo Contrast  08/01/2013   CLINICAL DATA:  Headache and syncope  EXAM: CT HEAD WITHOUT CONTRAST  TECHNIQUE: Contiguous axial images were obtained from the base of the skull through the vertex without intravenous contrast. Study was obtained within 24 hr of patient's arrival at the emergency department.  COMPARISON:  April 04, 2012.  FINDINGS: The ventricles are normal in size and configuration. There is no mass, hemorrhage, extra-axial fluid collection, or midline shift. No focal gray-white compartment lesions are identified. There is no evidence suggesting acute infarct. Bony calvarium appears intact. The mastoid air cells are clear. There is a bony spur arising from the nasal septum extending toward the left causing partial nares  obstruction on the left.  IMPRESSION: Bony spur arising toward the left from the nasal septum. No intracranial mass, hemorrhage, or focal gray/white compartment lesion. No acute infarct apparent.   Electronically Signed   By: Lowella Grip M.D.   On: 08/01/2013 08:00   Personally viewed.   EKG:   08/01/13 at 10 AM-sinus rhythm, poor R wave progression, no ST segment changes. No significant change from prior. Personally viewed.  Nuclear stress test-05/01/12-no ischemia, normal EF Echocardiogram-04/28/12-normal EF, no valvular abnormalities ASSESSMENT/PLAN:   58 year old female with syncopal episode, chest pain,  history of anxiety, depression, irritable bowel syndrome with previous cardiac workup in 2013 unremarkable.  1. Chest pain-currently she is feeling better after nitroglycerin paste and decrease in blood pressure. Troponin thus far is reassuring. EKG reassuring. I will observe her overnight, n.p.o. after midnight, nuclear stress test in the morning. Fairly atypical symptoms. Previous cardiac workup has been unremarkable. If symptoms worsen or become more worrisome, cardiac catheterization may be warranted. I will place on low-dose beta blocker.  2. Syncope-possibly neurocardiogenic or vasovagal happening in the early morning hours as she got out of bed going to the bathroom. Autonomic relaxation at that time. She has had syncope in this setting previously. Continue with adequate hydration. I will check a TSH. Monitor on telemetry. Her daughter has SVT and she was wondering if arrhythmia could be playing a component. Telemetry will be helpful. I will hydrate with normal saline 75 mL over 24 hours.  3. Elevated blood pressure-blood pressure is now improved. Likely anxiety related. We will continue to monitor.  4. Bipolar disorder-we will continue with her home medications.  Anticipate discharge tomorrow after cardiac study performed.  Candee Furbish, MD  08/01/2013  1:00 PM

## 2013-08-01 NOTE — ED Notes (Signed)
Pt ready for transport to bed assignment

## 2013-08-01 NOTE — ED Provider Notes (Addendum)
CSN: [id number]     Arrival date & time 08/01/13  [id number] History   First MD Initiated Contact with Patient 08/01/13 [phone]     Chief Complaint  Patient presents with  . Chest Pain     (Consider location/radiation/quality/duration/timing/severity/associated sxs/prior Treatment) Patient is a 58 y.o. female presenting with chest pain. The history is provided by the patient.  Chest Pain Pain location:  Substernal area Associated symptoms: diaphoresis, dizziness, nausea and shortness of breath   Associated symptoms: no abdominal pain, no back pain, no fever, no headache, no numbness, not vomiting and no weakness    patient was syncopal episode at 5:30 this morning. No significant warnings leading into that. Following syncopal episode patient developed chest pain pain is 6-7/10 left chest nonradiating. Patient did have a similar episode back in 2013. Followed by [facility] cardiology had an echo and a stress test that time. Today's event associated with some dizziness nausea some mild shortness of breath did have a syncopal episode. Pain is described as sharp.  Past Medical History  Diagnosis Date  . IBS (irritable bowel syndrome)   . Anxiety   . Depression   . Hypothyroidism     hypothyroidism  . Migraine   . Chest pain     Emergency room April 23, 2012, troponin is normal, chest CT scan showed no pulmonary embolus  . Cough     Chronic cough  . Ejection fraction     EF 55-60%, echo, December, 2013, lipomatous hypertrophy of the atrial septum  . GERD (gastroesophageal reflux disease)   . Personal history of failed moderate sedation 06/20/2012  . Personal history of colonic adenoma 06/28/2012   Past Surgical History  Procedure Laterality Date  . Cholecystectomy    . Dilation and curettage of uterus      x3  . Knee surgery      x2  . Wrist surgery      x3  . Laparoscopy  06/03/1990    to evaluate dysfunctional menses & pelvic pain  . Breast lumpectomy      left  . Cystocele repair  01/13/09   . Upper gastrointestinal endoscopy  2004    esophageal dilation  . Colonoscopy  2002  . Mastectomy, partial      benign fibrocystic changes  . Elbow surgery  2005    left   Family History  Problem Relation Age of Onset  . Coronary artery disease Mother   . Stroke Mother     TIA  . Uterine cancer Mother     BREAST  . Lung cancer Father     LUNG  . Heart disease Father   . Colon cancer Paternal Uncle     COLON; STOMACH  . COPD Father   . Thyroid disease Mother     goiter  . Lung cancer Maternal Grandfather   . Coronary artery disease Father   . Rheum arthritis Maternal Aunt   . Supraventricular tachycardia Daughter     S/P ablation   History  Substance Use Topics  . Smoking status: Never Smoker   . Smokeless tobacco: Never Used  . Alcohol Use: Yes     Comment: occ   OB History   Grav Para Term Preterm Abortions TAB SAB Ect Mult Living                 Review of Systems  Constitutional: Positive for diaphoresis. Negative for fever.  HENT: Negative for congestion.   Eyes: Negative for visual disturbance.  Respiratory: Positive for shortness of breath.   Cardiovascular: Positive for chest pain. Negative for leg swelling.  Gastrointestinal: Positive for nausea. Negative for vomiting and abdominal pain.  Genitourinary: Negative for dysuria.  Musculoskeletal: Negative for back pain.  Skin: Negative for rash.  Neurological: Positive for dizziness and syncope. Negative for seizures, weakness, numbness and headaches.  Hematological: Does not bruise/bleed easily.  Psychiatric/Behavioral: Negative for confusion.      Allergies  Amoxicillin; Benzonatate; Fentanyl; Midazolam; Oxycodone-aspirin; Sulfonamide derivatives; Hydrocodone; Nalbuphine; Oxycodone-acetaminophen; Venlafaxine; and Verapamil  Home Medications   Current Outpatient Rx  Name  Route  Sig  Dispense  Refill  . buPROPion (WELLBUTRIN) 100 MG tablet   Oral   Take 100 mg by mouth at bedtime.          Marland Kitchen ibuprofen (ADVIL,MOTRIN) 800 MG tablet   Oral   Take 1 tablet (800 mg total) by mouth 3 (three) times daily.   21 tablet   0   . lamoTRIgine (LAMICTAL) 150 MG tablet   Oral   Take 150 mg by mouth at bedtime.         Marland Kitchen levothyroxine (SYNTHROID, LEVOTHROID) 100 MCG tablet   Oral   Take 1 tablet (100 mcg total) by mouth daily.   90 tablet   1   . promethazine (PHENERGAN) 12.5 MG tablet   Oral   Take 1 tablet (12.5 mg total) by mouth every 6 (six) hours as needed for nausea or vomiting.   30 tablet   0   . traMADol (ULTRAM) 50 MG tablet   Oral   Take 1 tablet (50 mg total) by mouth every 6 (six) hours as needed for pain.   30 tablet   1    BP 175/88  Pulse 75  Temp(Src) 97.2 F (36.2 C)  Resp 19  SpO2 100% Physical Exam  Nursing note and vitals reviewed. Constitutional: She is oriented to person, place, and time. She appears well-developed and well-nourished. No distress.  HENT:  Head: Normocephalic and atraumatic.  Mouth/Throat: Oropharynx is clear and moist.  Eyes: Conjunctivae and EOM are normal. Pupils are equal, round, and reactive to light.  Neck: Normal range of motion. Neck supple.  Cardiovascular: Normal rate, regular rhythm and normal heart sounds.   No murmur heard. Pulmonary/Chest: Effort normal and breath sounds normal. No respiratory distress.  Abdominal: Soft. Bowel sounds are normal. There is no tenderness.  Musculoskeletal: Normal range of motion. She exhibits tenderness.  Mild tenderness to the left knee no abrasion no effusion no joint line tenderness.  Neurological: She is alert and oriented to person, place, and time. No cranial nerve deficit. She exhibits normal muscle tone. Coordination normal.  Skin: Skin is warm. No erythema.    ED Course  Procedures (including critical care time) Labs Review Labs Reviewed  COMPREHENSIVE METABOLIC PANEL - Abnormal; Notable for the following:    GFR calc non Af Amer 63 (*)    GFR calc Af Amer 73 (*)     All other components within normal limits  LIPASE, BLOOD  CBC WITH DIFFERENTIAL  PRO B NATRIURETIC PEPTIDE  D-DIMER, QUANTITATIVE  URINALYSIS, ROUTINE W REFLEX MICROSCOPIC  I-STAT TROPOININ, ED  I-STAT TROPOININ, ED   Results for orders placed during the hospital encounter of 08/01/13  COMPREHENSIVE METABOLIC PANEL      Result Value Ref Range   Sodium 141  137 - 147 mEq/L   Potassium 3.7  3.7 - 5.3 mEq/L   Chloride 102  96 - 112  mEq/L   CO2 25  19 - 32 mEq/L   Glucose, Bld 90  70 - 99 mg/dL   BUN 14  6 - 23 mg/dL   Creatinine, Ser 0.99  0.50 - 1.10 mg/dL   Calcium 9.5  8.4 - 10.5 mg/dL   Total Protein 7.8  6.0 - 8.3 g/dL   Albumin 3.9  3.5 - 5.2 g/dL   AST 18  0 - 37 U/L   ALT 18  0 - 35 U/L   Alkaline Phosphatase 103  39 - 117 U/L   Total Bilirubin 0.3  0.3 - 1.2 mg/dL   GFR calc non Af Amer 63 (*) >90 mL/min   GFR calc Af Amer 73 (*) >90 mL/min  LIPASE, BLOOD      Result Value Ref Range   Lipase 28  11 - 59 U/L  CBC WITH DIFFERENTIAL      Result Value Ref Range   WBC 6.3  4.0 - 10.5 K/uL   RBC 4.49  3.87 - 5.11 MIL/uL   Hemoglobin 14.4  12.0 - 15.0 g/dL   HCT 41.4  36.0 - 46.0 %   MCV 92.2  78.0 - 100.0 fL   MCH 32.1  26.0 - 34.0 pg   MCHC 34.8  30.0 - 36.0 g/dL   RDW 13.3  11.5 - 15.5 %   Platelets 179  150 - 400 K/uL   Neutrophils Relative % 60  43 - 77 %   Neutro Abs 3.7  1.7 - 7.7 K/uL   Lymphocytes Relative 29  12 - 46 %   Lymphs Abs 1.8  0.7 - 4.0 K/uL   Monocytes Relative 9  3 - 12 %   Monocytes Absolute 0.6  0.1 - 1.0 K/uL   Eosinophils Relative 2  0 - 5 %   Eosinophils Absolute 0.1  0.0 - 0.7 K/uL   Basophils Relative 0  0 - 1 %   Basophils Absolute 0.0  0.0 - 0.1 K/uL  PRO B NATRIURETIC PEPTIDE      Result Value Ref Range   Pro B Natriuretic peptide (BNP) 64.3  0 - 125 pg/mL  D-DIMER, QUANTITATIVE      Result Value Ref Range   D-Dimer, Quant 0.29  0.00 - 0.48 ug/mL-FEU  I-STAT TROPOININ, ED      Result Value Ref Range   Troponin i, poc 0.00   0.00 - 0.08 ng/mL   Comment 3             Imaging Review Dg Chest 2 View  08/01/2013   CLINICAL DATA:  Chest pain  EXAM: CHEST  2 VIEW  COMPARISON:  Chest CT April 24, 2012 and chest radiograph April 23, 2012  FINDINGS: Lungs are clear. Heart size and pulmonary vascularity are normal. No adenopathy. No pneumothorax. No bone lesions. There is a safety pin located anteriorly on the lateral view over the upper abdomen. This finding is not seen on the frontal view and presumably overlies the patient.  IMPRESSION: Safety pin which presumably overlies the patient on the lateral view. Clinical assessment advised to confirm that the safety pin indeed overlying the patient. Lungs are clear.   Electronically Signed   By: Lowella Grip M.D.   On: 08/01/2013 07:50   Ct Head Wo Contrast  08/01/2013   CLINICAL DATA:  Headache and syncope  EXAM: CT HEAD WITHOUT CONTRAST  TECHNIQUE: Contiguous axial images were obtained from the base of the skull through the vertex without intravenous  contrast. Study was obtained within 24 hr of patient's arrival at the emergency department.  COMPARISON:  April 04, 2012.  FINDINGS: The ventricles are normal in size and configuration. There is no mass, hemorrhage, extra-axial fluid collection, or midline shift. No focal gray-white compartment lesions are identified. There is no evidence suggesting acute infarct. Bony calvarium appears intact. The mastoid air cells are clear. There is a bony spur arising from the nasal septum extending toward the left causing partial nares obstruction on the left.  IMPRESSION: Bony spur arising toward the left from the nasal septum. No intracranial mass, hemorrhage, or focal gray/white compartment lesion. No acute infarct apparent.   Electronically Signed   By: Lowella Grip M.D.   On: 08/01/2013 08:00     EKG Interpretation   Date/Time:  Wednesday August 01 2013 06:30:59 EDT Ventricular Rate:  78 PR Interval:  158 QRS Duration: 90 QT  Interval:  384 QTC Calculation: 437 R Axis:   58 Text Interpretation:  Normal sinus rhythm Normal ECG No significant change  since last tracing Reconfirmed by Rudolpho Claxton  MD, Maninder Deboer ([id number]) on 08/01/2013  7:13:35 AM      MDM   Final diagnoses:  Syncope  Chest pain    The patient was syncopal episode unexplained this morning at 5:30. Fell into the bathroom no complaints from any injuries. Following that she started with chest pain 7/10 some shortness of breath was diaphoretic some dizziness. The patient has had chest pain in the past and syncope in the past has had workup by cardiology followed by Dr. Ron Parker. Has not had a cardiac cath. Patient here initially was pain free following nitroglycerin that was given in the ED. Pain has reoccurred. Will treat with morphine and some more nitroglycerin as an appointment. Cardiology contacted. EKG without acute changes. First troponin negative chest x-ray without any acute cardiopulmonary findings. Head CT negative. Cardiology will see the patient and disposition. Have ordered a second troponin. Also will order second EKG.     Mervin Kung, MD 08/01/13 [phone]   Chest pain resolved with some IV morphine and nitroglycerin paste. Repeat EKG without any acute changes. Second troponin is still pending. Awaiting cardiology's consultation. Cardiology is planning on admitting the patient himself.  Mervin Kung, MD 08/01/13 [phone]

## 2013-08-01 NOTE — ED Notes (Signed)
Patient transported to X-ray 

## 2013-08-01 NOTE — ED Notes (Signed)
Per Pt: Pt had syncopal episode walking to the bathroom at her room. Brief LOC. Pt then report CP, 7 out 10 pain. Pt had some SOB, was diaphoretic, and dizziness. Currently Ax4, NAD.

## 2013-08-02 ENCOUNTER — Other Ambulatory Visit: Payer: Self-pay

## 2013-08-02 ENCOUNTER — Observation Stay (HOSPITAL_COMMUNITY): Payer: BC Managed Care – PPO

## 2013-08-02 DIAGNOSIS — R079 Chest pain, unspecified: Secondary | ICD-10-CM

## 2013-08-02 LAB — CBC
HCT: 36.2 % (ref 36.0–46.0)
Hemoglobin: 12 g/dL (ref 12.0–15.0)
MCH: 31.3 pg (ref 26.0–34.0)
MCHC: 33.1 g/dL (ref 30.0–36.0)
MCV: 94.3 fL (ref 78.0–100.0)
Platelets: 147 10*3/uL — ABNORMAL LOW (ref 150–400)
RBC: 3.84 MIL/uL — ABNORMAL LOW (ref 3.87–5.11)
RDW: 13.8 % (ref 11.5–15.5)
WBC: 5 10*3/uL (ref 4.0–10.5)

## 2013-08-02 LAB — BASIC METABOLIC PANEL
BUN: 12 mg/dL (ref 6–23)
CO2: 23 mEq/L (ref 19–32)
Calcium: 8 mg/dL — ABNORMAL LOW (ref 8.4–10.5)
Chloride: 107 mEq/L (ref 96–112)
Creatinine, Ser: 0.97 mg/dL (ref 0.50–1.10)
GFR calc Af Amer: 75 mL/min — ABNORMAL LOW (ref >90)
GFR calc non Af Amer: 65 mL/min — ABNORMAL LOW (ref >90)
Glucose, Bld: 90 mg/dL (ref 70–99)
Potassium: 3.7 mEq/L (ref 3.7–5.3)
Sodium: 142 mEq/L (ref 137–147)

## 2013-08-02 LAB — LIPID PANEL
Cholesterol: 165 mg/dL (ref 0–200)
HDL: 38 mg/dL — ABNORMAL LOW (ref >39)
LDL Cholesterol: 98 mg/dL (ref 0–99)
Total CHOL/HDL Ratio: 4.3 RATIO
Triglycerides: 143 mg/dL (ref <150)
VLDL: 29 mg/dL (ref 0–40)

## 2013-08-02 LAB — TROPONIN I: Troponin I: <0.3 ng/mL (ref <0.30)

## 2013-08-02 MED ORDER — REGADENOSON 0.4 MG/5ML IV SOLN
INTRAVENOUS | Status: AC
  Administered 2013-08-02: 0.4 mg via INTRAVENOUS
  Filled 2013-08-02: qty 5

## 2013-08-02 MED ORDER — TECHNETIUM TC 99M SESTAMIBI GENERIC - CARDIOLITE
10.0000 | Freq: Once | INTRAVENOUS | Status: AC | PRN
  Administered 2013-08-02: 10 via INTRAVENOUS

## 2013-08-02 MED ORDER — TECHNETIUM TC 99M SESTAMIBI GENERIC - CARDIOLITE
30.0000 | Freq: Once | INTRAVENOUS | Status: AC | PRN
  Administered 2013-08-02: 30 via INTRAVENOUS

## 2013-08-02 MED ORDER — REGADENOSON 0.4 MG/5ML IV SOLN
0.4000 mg | Freq: Once | INTRAVENOUS | Status: AC
  Administered 2013-08-02: 0.4 mg via INTRAVENOUS
  Filled 2013-08-02: qty 5

## 2013-08-02 MED ORDER — ONDANSETRON HCL 4 MG/2ML IJ SOLN: 4.0000 mg | Freq: Four times a day (QID) | INTRAMUSCULAR | Status: DC | PRN

## 2013-08-02 MED ORDER — ACETAMINOPHEN 500 MG PO TABS
1000.0000 mg | ORAL_TABLET | Freq: Four times a day (QID) | ORAL | Status: DC | PRN
  Administered 2013-08-02: 1000 mg via ORAL
  Filled 2013-08-02: qty 2

## 2013-08-02 NOTE — Progress Notes (Signed)
New orders for nausea meds and Tylenol for h/a; will cont. To monitor.

## 2013-08-02 NOTE — Progress Notes (Signed)
UR completed Edwyna Shell, RN, BSN, Case Managers 08/02/2013 9:04 AM

## 2013-08-02 NOTE — Progress Notes (Signed)
Pt given d/c instructions at this time; pt verbalized understanding; pt to d/c home with boyfriend.

## 2013-08-02 NOTE — Progress Notes (Addendum)
Subjective: Headache from NTG paste  Objective: Vital signs in last 24 hours: Temp:  [97.7 F (36.5 C)-98.4 F (36.9 C)] 98.1 F (36.7 C) (04/02 0411) Pulse Rate:  [59-82] 59 (04/02 0411) Resp:  [12-22] 20 (04/01 1424) BP: (130-152)/(52-82) 152/69 mmHg (04/02 1150) SpO2:  [93 %-99 %] 99 % (04/02 0411) Weight:  [190 lb 4.8 oz (86.32 kg)-197 lb 12 oz (89.7 kg)] 190 lb 4.8 oz (86.32 kg) (04/02 0432) Weight change:  Last BM Date: 07/30/13 Intake/Output from previous day: +1041 04/01 0701 - 04/02 0700 In: 1041.3 [I.V.:1041.3] Out: -  Intake/Output this shift: Total I/O In: 328.8 [I.V.:328.8] Out: -   PE: General:Pleasant affect, NAD Skin:Warm and dry, brisk capillary refill HEENT:normocephalic, sclera clear, mucus membranes moist Neck:supple, no JVD Heart:S1S2 RRR without murmur, gallup, rub or click Lungs:clear without rales, rhonchi, or wheezes OEU:MPNT, non tender, + BS, do not palpate liver spleen or masses Ext:no lower ext edema  Neuro:alert and oriented, MAE, follows commands, + facial symmetry   Lab Results:  Recent Labs  08/01/13 1610 08/02/13 0545  WBC 5.4 5.0  HGB 12.3 12.0  HCT 36.6 36.2  PLT 164 147*   BMET  Recent Labs  08/01/13 0810 08/01/13 1610 08/02/13 0545  NA 141  --  142  K 3.7  --  3.7  CL 102  --  107  CO2 25  --  23  GLUCOSE 90  --  90  BUN 14  --  12  CREATININE 0.99 0.99 0.97  CALCIUM 9.5  --  8.0*    Recent Labs  08/01/13 2245 08/02/13 0405  TROPONINI <0.30 <0.30    Lab Results  Component Value Date   CHOL 165 08/02/2013   HDL 38* 08/02/2013   LDLCALC 98 08/02/2013   LDLDIRECT 126.5 10/12/2006   TRIG 143 08/02/2013   CHOLHDL 4.3 08/02/2013   Lab Results  Component Value Date   HGBA1C 5.6 08/01/2013     Lab Results  Component Value Date   TSH 1.770 08/01/2013    Hepatic Function Panel  Recent Labs  08/01/13 0810  PROT 7.8  ALBUMIN 3.9  AST 18  ALT 18  ALKPHOS 103  BILITOT 0.3    Recent Labs  08/02/13 0545  CHOL 165   No results found for this basename: PROTIME,  in the last 72 hours     Studies/Results: Dg Chest 2 View  08/01/2013   CLINICAL DATA:  Chest pain  EXAM: CHEST  2 VIEW  COMPARISON:  Chest CT April 24, 2012 and chest radiograph April 23, 2012  FINDINGS: Lungs are clear. Heart size and pulmonary vascularity are normal. No adenopathy. No pneumothorax. No bone lesions. There is a safety pin located anteriorly on the lateral view over the upper abdomen. This finding is not seen on the frontal view and presumably overlies the patient.  IMPRESSION: Safety pin which presumably overlies the patient on the lateral view. Clinical assessment advised to confirm that the safety pin indeed overlying the patient. Lungs are clear.   Electronically Signed   By: Lowella Grip M.D.   On: 08/01/2013 07:50   Ct Head Wo Contrast  08/01/2013   CLINICAL DATA:  Headache and syncope  EXAM: CT HEAD WITHOUT CONTRAST  TECHNIQUE: Contiguous axial images were obtained from the base of the skull through the vertex without intravenous contrast. Study was obtained within 24 hr of patient's arrival at the emergency department.  COMPARISON:  April 04, 2012.  FINDINGS: The ventricles are normal in size and configuration. There is no mass, hemorrhage, extra-axial fluid collection, or midline shift. No focal gray-white compartment lesions are identified. There is no evidence suggesting acute infarct. Bony calvarium appears intact. The mastoid air cells are clear. There is a bony spur arising from the nasal septum extending toward the left causing partial nares obstruction on the left.  IMPRESSION: Bony spur arising toward the left from the nasal septum. No intracranial mass, hemorrhage, or focal gray/white compartment lesion. No acute infarct apparent.   Electronically Signed   By: Lowella Grip M.D.   On: 08/01/2013 08:00    Medications: I have reviewed the patient's current medications. Scheduled  Meds: . aspirin EC  81 mg Oral Daily  . buPROPion  100 mg Oral QHS  . heparin  5,000 Units Subcutaneous 3 times per day  . lamoTRIgine  150 mg Oral QHS  . levothyroxine  100 mcg Oral QAC breakfast  . metoprolol tartrate  12.5 mg Oral BID  . regadenoson      . regadenoson  0.4 mg Intravenous Once   Continuous Infusions: . sodium chloride 100 mL/hr at 08/01/13 0812  . sodium chloride 75 mL/hr at 08/02/13 0439   PRN Meds:.ALPRAZolam, nitroGLYCERIN, promethazine, traMADol  Assessment/Plan: Principal Problem:   Syncope- no further episodes, negative MI Active Problems:   Chronic cough   IBS (irritable bowel syndrome)   Anxiety   Chest pain- no further chest pain.  Lexiscan myoview completed without complications, + nausea, resolved in recovery.   LOS: 1 day    [facility]  Nurse Practitioner Certified Pager [phone] or after 5pm and on weekends call [phone] 08/02/2013, 12:03 PM  Patient seen and examined. I agree with the assessment and plan as detailed above. See also my additional thoughts below.    The nuclear stress study shows normal left ventricular function and no signs of scar or ischemia. I discussed this with the patient and her family. She can be discharged home.  Dola Argyle, MD, [facility] 08/02/2013 4:04 PM

## 2013-08-02 NOTE — Progress Notes (Signed)
Nitropaste was removed at 1145 for [place]. Serita Butcher PA aware.

## 2013-08-02 NOTE — Progress Notes (Signed)
Pt back from stress test and c/o nausea; pt given PO Phenergan; will cont. To monitor.

## 2013-08-02 NOTE — Discharge Summary (Signed)
CARDIOLOGY DISCHARGE SUMMARY   Patient ID: Sarah Salazar MRN: [id number] DOB/AGE: 1955-12-04 58 y.o.  Admit date: 08/01/2013 Discharge date: 08/02/2013  PCP: Garnet Koyanagi, DO Primary Cardiologist: Dr. Ron Parker  Primary Discharge Diagnosis:   Precordial pain  Secondary Discharge Diagnosis:    Chronic cough   IBS (irritable bowel syndrome)   Anxiety   Syncope   Hyperlipidemia  Procedures: Lexiscan nuclear stress test.   Hospital Course: SAVAYAH WALTRIP is a 58 y.o. female with no history of CAD. She has been evaluated for chest pain in the past, but no significant CAD found. She got up to [place], had a syncopal episode and developed chest pain. She came to the ER and was admitted for further evaluation and treatment.  Her cardiac enzymes were negative for MI. Her chest Xray, labs and ECG did not show any acute abnormalities. A TSH was OK, and her lipid profile was reviewed as well.   On 04/02, she had a Lexiscan Cardiolite performed. She tolerated the procedure well. The results are below, she had no abnormalities and her EF was normal.  Dr. Ron Parker saw her on 04/02 and reviewed all data. Her syncope is felt vasovagal in origin and no arrhythmia was seen on telemetry. No further inpatient workup is indicated and she is considered stable for discharge, to follow up as an outpatient.   Labs:   Lab Results  Component Value Date   WBC 5.0 08/02/2013   HGB 12.0 08/02/2013   HCT 36.2 08/02/2013   MCV 94.3 08/02/2013   PLT 147* 08/02/2013    Recent Labs Lab 08/01/13 0810  08/02/13 0545  NA 141  --  142  K 3.7  --  3.7  CL 102  --  107  CO2 25  --  23  BUN 14  --  12  CREATININE 0.99  < > 0.97  CALCIUM 9.5  --  8.0*  PROT 7.8  --   --   BILITOT 0.3  --   --   ALKPHOS 103  --   --   ALT 18  --   --   AST 18  --   --   GLUCOSE 90  --  90  < > = values in this interval not displayed.  Recent Labs  08/01/13 1610 08/01/13 2245 08/02/13 0405  TROPONINI <0.30 <0.30 <0.30   Lipid Panel       Component Value Date/Time   CHOL 165 08/02/2013 0545   TRIG 143 08/02/2013 0545   HDL 38* 08/02/2013 0545   CHOLHDL 4.3 08/02/2013 0545   VLDL 29 08/02/2013 0545   LDLCALC 98 08/02/2013 0545    Pro B Natriuretic peptide (BNP)  Date/Time Value Ref Range Status  08/01/2013  8:10 AM 64.3  0 - 125 pg/mL Final    Recent Labs  08/01/13 1610  INR 1.05   Lab Results  Component Value Date   TSH 1.770 08/01/2013      Radiology: Dg Chest 2 View 08/01/2013   CLINICAL DATA:  Chest pain  EXAM: CHEST  2 VIEW  COMPARISON:  Chest CT April 24, 2012 and chest radiograph April 23, 2012  FINDINGS: Lungs are clear. Heart size and pulmonary vascularity are normal. No adenopathy. No pneumothorax. No bone lesions. There is a safety pin located anteriorly on the lateral view over the upper abdomen. This finding is not seen on the frontal view and presumably overlies the patient.  IMPRESSION: Safety pin which presumably overlies  the patient on the lateral view. Clinical assessment advised to confirm that the safety pin indeed overlying the patient. Lungs are clear.   Electronically Signed   By: Lowella Grip M.D.   On: 08/01/2013 07:50   Ct Head Wo Contrast 08/01/2013   CLINICAL DATA:  Headache and syncope  EXAM: CT HEAD WITHOUT CONTRAST  TECHNIQUE: Contiguous axial images were obtained from the base of the skull through the vertex without intravenous contrast. Study was obtained within 24 hr of patient's arrival at the emergency department.  COMPARISON:  April 04, 2012.  FINDINGS: The ventricles are normal in size and configuration. There is no mass, hemorrhage, extra-axial fluid collection, or midline shift. No focal gray-white compartment lesions are identified. There is no evidence suggesting acute infarct. Bony calvarium appears intact. The mastoid air cells are clear. There is a bony spur arising from the nasal septum extending toward the left causing partial nares obstruction on the left.  IMPRESSION: Bony  spur arising toward the left from the nasal septum. No intracranial mass, hemorrhage, or focal gray/white compartment lesion. No acute infarct apparent.   Electronically Signed   By: Lowella Grip M.D.   On: 08/01/2013 08:00   Nm Myocar Multi W/spect W/wall Motion / Ef 08/02/2013   CLINICAL DATA:  Chest pain. Family history of cardiac disease. Syncope.  EXAM: MYOCARDIAL IMAGING WITH SPECT (REST AND PHARMACOLOGIC-STRESS)  GATED LEFT VENTRICULAR WALL MOTION STUDY  LEFT VENTRICULAR EJECTION FRACTION  TECHNIQUE: Standard myocardial SPECT imaging was performed after resting intravenous injection of 10 mCi Tc-34m sestamibi. Subsequently, intravenous infusion of Lexiscan was performed under the supervision of the Cardiology staff. At peak effect of the drug, 30 mCi Tc-39m sestamibi was injected intravenously and standard myocardial SPECT imaging was performed. Quantitative gated imaging was also performed to evaluate left ventricular wall motion, and estimate left ventricular ejection fraction.  COMPARISON:  Two-view chest 08/01/2013  FINDINGS: Normal uptake is present. Effusion is within normal limits. There is no significant inducible reversibility or perfusion defect.  Normal contractility and wall motion is evident.  The estimated diastolic volume is 62 mL. The estimated systolic volume is 18 mL. The calculated ejection fraction is 70%.  IMPRESSION: 1. Normal 1 day nuclear medicine pharmacologically induced stress test. 2. Normal contractility an wall motion. 3. No reversible ischemia. 4. Normal cardiac function within estimated ejection fraction of 70%.   Electronically Signed   By: Lawrence Santiago M.D.   On: 08/02/2013 14:17   EKG:  08/02/2013 SR, no acute ischemic changes. Vent. rate 62 BPM PR interval 186 ms QRS duration 88 ms QT/QTc 438/444 ms P-R-T axes 50 32 42  FOLLOW UP PLANS AND APPOINTMENTS Allergies  Allergen Reactions  . Amoxicillin Rash      . Benzonatate Rash    Tessalon Perles and  amoxicillin were being taken simultaneously when she developed a rash  . Fentanyl Rash    given with Versed  . Midazolam Rash    given with Fentanyl  . Oxycodone-Aspirin Rash  . Sulfonamide Derivatives Rash    rash  . Hydrocodone Hives and Nausea And Vomiting  . Nalbuphine Nausea And Vomiting  . Oxycodone-Acetaminophen Hives and Nausea And Vomiting  . Venlafaxine Other (See Comments)    Patient says makes her crazy  . Verapamil     Caused "pounding " in chest     Medication List         buPROPion 100 MG tablet  Commonly known as:  WELLBUTRIN  Take 100 mg  by mouth at bedtime.     ibuprofen 800 MG tablet  Commonly known as:  ADVIL,MOTRIN  Take 1 tablet (800 mg total) by mouth 3 (three) times daily.     lamoTRIgine 150 MG tablet  Commonly known as:  LAMICTAL  Take 150 mg by mouth at bedtime.     levothyroxine 100 MCG tablet  Commonly known as:  SYNTHROID, LEVOTHROID  Take 1 tablet (100 mcg total) by mouth daily.     promethazine 12.5 MG tablet  Commonly known as:  PHENERGAN  Take 1 tablet (12.5 mg total) by mouth every 6 (six) hours as needed for nausea or vomiting.     traMADol 50 MG tablet  Commonly known as:  ULTRAM  Take 1 tablet (50 mg total) by mouth every 6 (six) hours as needed for pain.        Discharge Orders   Future Appointments Provider Department Dept Phone   08/13/2013 7:30 AM [id number] [facility] [phone]   Future Orders Complete By Expires   Diet - low sodium heart healthy  As directed    Increase activity slowly  As directed      Follow-up Information   Follow up with Dola Argyle, MD. (As needed)    Specialty:  Cardiology   Contact information:   [address] [place] [address] [phone]       Schedule an appointment as soon as possible for a visit with Garnet Koyanagi, DO.   Specialty:  Family Medicine   Contact information:   [phone] [place]  [address] [phone]       BRING ALL MEDICATIONS WITH YOU TO FOLLOW UP APPOINTMENTS  Time spent with patient to include physician time: 33 min Signed: Rosaria Ferries, PA-C 08/02/2013, 5:06 PM Patient seen and examined. I agree with the assessment and plan as detailed above. See also my additional thoughts below.   Refer to my progress note also. I made the clinical decision for the patient to go home. I agree with the discharge summary as noted  Dola Argyle, MD, [facility] 08/02/2013 6:06 PM

## 2013-08-06 ENCOUNTER — Telehealth: Payer: Self-pay | Admitting: *Deleted

## 2013-08-06 NOTE — Telephone Encounter (Addendum)
Spoke with patient who is requesting a hospital follow up appt. Patient was seen in ER and kept overnight for observation due to complaints of CP, cardiac w/u neg. Patient also stated that she went to see Dr. Ron Parker for placement of Holter monitor (due to a fainting spell.) and was told " you don't need this, there is nothing wrong with you." He then told her to go home.(I have reviewed his notes and he did not feel it was appropriate and that further f/u with cards was not necessary.  Patient is requesting a new referral to another cards office because of this. I advised her to speak with Dr.Lowne about this at her appt and they could determine what needed to be done. Follow up appt made with Dr. Etter Sjogren on 4/15 at 11:30.

## 2013-08-08 ENCOUNTER — Telehealth: Payer: Self-pay | Admitting: Family Medicine

## 2013-08-08 ENCOUNTER — Ambulatory Visit (INDEPENDENT_AMBULATORY_CARE_PROVIDER_SITE_OTHER): Payer: Self-pay | Admitting: Family Medicine

## 2013-08-08 ENCOUNTER — Encounter: Payer: Self-pay | Admitting: Family Medicine

## 2013-08-08 VITALS — BP 138/90 | HR 86 | Temp 98.8°F | Wt 186.0 lb

## 2013-08-08 DIAGNOSIS — R002 Palpitations: Secondary | ICD-10-CM

## 2013-08-08 DIAGNOSIS — I10 Essential (primary) hypertension: Secondary | ICD-10-CM

## 2013-08-08 MED ORDER — METOPROLOL SUCCINATE ER 50 MG PO TB24: 50.0000 mg | ORAL_TABLET | Freq: Every day | ORAL | Status: DC

## 2013-08-08 NOTE — Progress Notes (Signed)
  Subjective:    Sarah Salazar is a 58 y.o. female who presents for evaluation of loss of consciousness. Onset was last  week.  Symptoms have  completely resolved since that time. Patient describes the episode as a sudden loss of consciousness without warning. Associated symptoms: tachycardia/palpitations. The patient denies abdominal pain, diarrhea, excessive thirst, headache, heavy menstrual bleeding, history of CAD, melena and visual aura. Medications putting patient at risk for syncope: none.  The following portions of the patient's history were reviewed and updated as appropriate: allergies, current medications, past family history, past medical history, past social history, past surgical history and problem list.  Review of Systems Pertinent items are noted in HPI.   Objective:    BP 138/90  Pulse 86  Temp(Src) 98.8 F (37.1 C) (Oral)  Wt 186 lb (84.369 kg)  SpO2 97% General appearance: alert, cooperative, appears stated age and no distress Neck: no adenopathy, no carotid bruit, no JVD, supple, symmetrical, trachea midline and thyroid not enlarged, symmetric, no tenderness/mass/nodules Lungs: clear to auscultation bilaterally Heart: S1, S2 normal Extremities: extremities normal, atraumatic, no cyanosis or edema  Cardiographics ECG: not done--hosp chart reviewed   Assessment:    Vasovagal syncope   Plan:    hospital records reviewed --labs, imaging, stress test  1. HTN (hypertension) Start meds--rto 2 weeks - metoprolol succinate (TOPROL-XL) 50 MG 24 hr tablet; Take 1 tablet (50 mg total) by mouth daily. Take with or immediately following a meal.  Dispense: 90 tablet; Refill: 3  2. Palpitations

## 2013-08-08 NOTE — Patient Instructions (Signed)
Syncope  Syncope is a fainting spell. This means the person loses consciousness and drops to the ground. The person is generally unconscious for less than 5 minutes. The person may have some muscle twitches for up to 15 seconds before waking up and returning to normal. Syncope occurs more often in elderly people, but it can happen to anyone. While most causes of syncope are not dangerous, syncope can be a sign of a serious medical problem. It is important to seek medical care.   CAUSES   Syncope is caused by a sudden decrease in blood flow to the brain. The specific cause is often not determined. Factors that can trigger syncope include:   Taking medicines that lower blood pressure.   Sudden changes in posture, such as standing up suddenly.   Taking more medicine than prescribed.   Standing in one place for too long.   Seizure disorders.   Dehydration and excessive exposure to heat.   Low blood sugar (hypoglycemia).   Straining to have a bowel movement.   Heart disease, irregular heartbeat, or other circulatory problems.   Fear, emotional distress, seeing blood, or severe pain.  SYMPTOMS   Right before fainting, you may:   Feel dizzy or lightheaded.   Feel nauseous.   See all white or all black in your field of vision.   Have cold, clammy skin.  DIAGNOSIS   Your caregiver will ask about your symptoms, perform a physical exam, and perform electrocardiography (ECG) to record the electrical activity of your heart. Your caregiver may also perform other heart or blood tests to determine the cause of your syncope.  TREATMENT   In most cases, no treatment is needed. Depending on the cause of your syncope, your caregiver may recommend changing or stopping some of your medicines.  HOME CARE INSTRUCTIONS   Have someone stay with you until you feel stable.   Do not drive, operate machinery, or play sports until your caregiver says it is okay.   Keep all follow-up appointments as directed by your  caregiver.   Lie down right away if you start feeling like you might faint. Breathe deeply and steadily. Wait until all the symptoms have passed.   Drink enough fluids to keep your urine clear or pale yellow.   If you are taking blood pressure or heart medicine, get up slowly, taking several minutes to sit and then stand. This can reduce dizziness.  SEEK IMMEDIATE MEDICAL CARE IF:    You have a severe headache.   You have unusual pain in the chest, abdomen, or back.   You are bleeding from the mouth or rectum, or you have black or tarry stool.   You have an irregular or very fast heartbeat.   You have pain with breathing.   You have repeated fainting or seizure-like jerking during an episode.   You faint when sitting or lying down.   You have confusion.   You have difficulty walking.   You have severe weakness.   You have vision problems.  If you fainted, call your local emergency services (911 in [place] Do not drive yourself to the hospital.   MAKE SURE YOU:   Understand these instructions.   Will watch your condition.   Will get help right away if you are not doing well or get worse.  Document Released: 04/19/2005 Document Revised: 10/19/2011 Document Reviewed: 06/18/2011  [employer] Patient Information 2014 [employer].

## 2013-08-08 NOTE — Telephone Encounter (Signed)
Relevant patient education assigned to patient using Emmi. ° °

## 2013-08-08 NOTE — Progress Notes (Signed)
Pre visit review using our clinic review tool, if applicable. No additional management support is needed unless otherwise documented below in the visit note. 

## 2013-08-13 ENCOUNTER — Other Ambulatory Visit: Payer: BC Managed Care – PPO

## 2013-08-15 ENCOUNTER — Ambulatory Visit: Payer: BC Managed Care – PPO | Admitting: Family Medicine

## 2013-08-15 ENCOUNTER — Encounter: Payer: Self-pay | Admitting: *Deleted

## 2013-08-15 ENCOUNTER — Telehealth: Payer: Self-pay | Admitting: Family Medicine

## 2013-08-15 ENCOUNTER — Ambulatory Visit (INDEPENDENT_AMBULATORY_CARE_PROVIDER_SITE_OTHER): Payer: Self-pay | Admitting: Nurse Practitioner

## 2013-08-15 ENCOUNTER — Encounter: Payer: Self-pay | Admitting: Nurse Practitioner

## 2013-08-15 VITALS — BP 162/76 | HR 71 | Temp 98.1°F | Ht 64.0 in | Wt 188.4 lb

## 2013-08-15 DIAGNOSIS — I10 Essential (primary) hypertension: Secondary | ICD-10-CM

## 2013-08-15 MED ORDER — METOPROLOL TARTRATE 25 MG PO TABS: 25.0000 mg | ORAL_TABLET | Freq: Two times a day (BID) | ORAL | Status: DC

## 2013-08-15 NOTE — Progress Notes (Signed)
Pre visit review using our clinic review tool, if applicable. No additional management support is needed unless otherwise documented below in the visit note. 

## 2013-08-15 NOTE — Telephone Encounter (Signed)
Patient called and stated that her blood pressure is high. It was 175/115 this morning and her hands/feet are swelling. Patient is scheduled to come in at 1:00pm to see Layne weaver for this but patient wanted to speak to a nurse.

## 2013-08-15 NOTE — Patient Instructions (Addendum)
Please start medication. It is $4 at [employer] [facility] for 30 day supply.  Consider reading about DASH diet. Try to eat smaller portions in evening & larger ones at breakfast & lunch. Please follow up in 3 weeks. I am sorry you are feeling frustrated. DASH Diet The DASH diet stands for "Dietary Approaches to Stop Hypertension." It is a healthy eating plan that has been shown to reduce high blood pressure (hypertension) in as little as 14 days, while also possibly providing other significant health benefits. These other health benefits include reducing the risk of breast cancer after menopause and reducing the risk of type 2 diabetes, heart disease, colon cancer, and stroke. Health benefits also include weight loss and slowing kidney failure in patients with chronic kidney disease.  DIET GUIDELINES  Limit salt (sodium). Your diet should contain less than 1500 mg of sodium daily.  Limit refined or processed carbohydrates. Your diet should include mostly whole grains. Desserts and added sugars should be used sparingly.  Include small amounts of heart-healthy fats. These types of fats include nuts, oils, and tub margarine. Limit saturated and trans fats. These fats have been shown to be harmful in the body. CHOOSING FOODS  The following food groups are based on a 2000 calorie diet. See your Registered Dietitian for individual calorie needs. Grains and Grain Products (6 to 8 servings daily)  Eat More Often: Whole-wheat bread, brown rice, whole-grain or wheat pasta, quinoa, popcorn without added fat or salt (air popped).  Eat Less Often: White bread, white pasta, white rice, cornbread. Vegetables (4 to 5 servings daily)  Eat More Often: Fresh, frozen, and canned vegetables. Vegetables may be raw, steamed, roasted, or grilled with a minimal amount of fat.  Eat Less Often/Avoid: Creamed or fried vegetables. Vegetables in a cheese sauce. Fruit (4 to 5 servings daily)  Eat More Often: All fresh, canned (in  natural juice), or frozen fruits. Dried fruits without added sugar. One hundred percent fruit juice ( cup [237 mL] daily).  Eat Less Often: Dried fruits with added sugar. Canned fruit in light or heavy syrup. [employer], Fish, and Poultry (2 servings or less daily. One serving is 3 to 4 oz [85-114 g]).  Eat More Often: Ninety percent or leaner ground beef, tenderloin, sirloin. Round cuts of beef, chicken breast, [place] breast. All fish. Grill, bake, or broil your meat. Nothing should be fried.  Eat Less Often/Avoid: Fatty cuts of meat, [place], or chicken leg, thigh, or wing. Fried cuts of meat or fish. Dairy (2 to 3 servings)  Eat More Often: Low-fat or fat-free milk, low-fat plain or light yogurt, reduced-fat or part-skim cheese.  Eat Less Often/Avoid: Milk (whole, 2%).Whole milk yogurt. Full-fat cheeses. Nuts, Seeds, and Legumes (4 to 5 servings per week)  Eat More Often: All without added salt.  Eat Less Often/Avoid: Salted nuts and seeds, canned beans with added salt. Fats and Sweets (limited)  Eat More Often: Vegetable oils, tub margarines without trans fats, sugar-free gelatin. Mayonnaise and salad dressings.  Eat Less Often/Avoid: Coconut oils, palm oils, butter, stick margarine, cream, half and half, cookies, candy, pie. FOR MORE INFORMATION The Dash Diet Eating Plan: www.dashdiet.org Document Released: 04/08/2011 Document Revised: 07/12/2011 Document Reviewed: 04/08/2011 [facility] Patient Information 2014 [place], [place].

## 2013-08-15 NOTE — Telephone Encounter (Signed)
Spoke with patient who states since she had started her new job, her B/P has been going up due to stress and she is now experiencing swelling in her hands and feet. Patient also stated that she eats a diet high in sodium (hamburger helper, potato chips, etc) due to having a low income. I advised to be cautious of sodium intake as it causes excess swelling and can also raise the B/P, she verbalized understanding. Patient reports no CP, nausea, vomiting or other cardiac related sx.

## 2013-08-15 NOTE — Assessment & Plan Note (Signed)
Not taking any med for HTN Did not start toprol XR due to cost. Has some spironolactone-takes when feels swollen. Educated on dangers of uncontrolled HTN: renal failure, heart disease, eye disease. Pt can afford toprol (4$ list at [facility]) will take twice daily.

## 2013-08-15 NOTE — Progress Notes (Signed)
Subjective:    Patient here for follow-up of elevated blood pressure.  She is active at her job-does a lot of walking & moves boxes all day. She tries to stay on reduced salt diet, but feels frustrated that she cannot afford fresh food & no has time to cook from scratch. She did not start on toprol because she could not afford medication. She had LE swelling last night & took spironolactone-old script.  She has not had any more episodes of palpitations. She is tearful because she feels her situation is too difficult to do the things her health care providers tell her to do (diet & exercise). In addition, she feels her job is overwhelming. I spent more than 10 minutes exploring ideas for food & explaining why hypertension needs to be controlled. She is tearful and appears angry, frustrated at least.  The following portions of the patient's history were reviewed and updated as appropriate: allergies, current medications, past medical history, past social history, past surgical history and problem list.  Review of Systems Pertinent items are noted in HPI.     Objective:    BP 162/76  Pulse 71  Temp(Src) 98.1 F (36.7 C) (Oral)  Ht 5\' 4"  (1.626 m)  Wt 188 lb 6.4 oz (85.458 kg)  BMI 32.32 kg/m2  SpO2 97% General appearance: alert, cooperative, appears stated age and moderate distress Head: Normocephalic, without obvious abnormality, atraumatic Eyes: negative findings: lids and lashes normal and conjunctivae and sclerae normal Psych: tearful, expresses frustration over job, finances & trying to follow low salt diet & lose weight.  Assessment & Plan:    HTN Prescribed med on [facility] $4 list. Pt states she can afford it. F/u 3 weeks. See prob list for complete A&P

## 2013-08-16 ENCOUNTER — Telehealth: Payer: Self-pay | Admitting: Family Medicine

## 2013-08-16 NOTE — Telephone Encounter (Signed)
Patient called stating the medication we gave her yesterday made her nauseous and she did not go to work today. She needs a note excusing her from work. Pt will come pick this up.

## 2013-08-16 NOTE — Telephone Encounter (Signed)
LMOVM for pt to return call 

## 2013-08-16 NOTE — Telephone Encounter (Signed)
Pls confirm that pt is planning to take this medication. She needs blood pressure management. It is extremely rare that this med causes nausea. Side effect should go away within few days. She should take med with food. I will provide a note for today only.

## 2013-08-16 NOTE — Telephone Encounter (Signed)
Please advise 

## 2013-08-16 NOTE — Telephone Encounter (Signed)
Patient returned call and was given info

## 2013-08-28 ENCOUNTER — Ambulatory Visit (INDEPENDENT_AMBULATORY_CARE_PROVIDER_SITE_OTHER): Payer: Self-pay | Admitting: Family Medicine

## 2013-08-28 ENCOUNTER — Encounter: Payer: Self-pay | Admitting: Family Medicine

## 2013-08-28 VITALS — BP 126/80 | HR 58 | Temp 99.0°F | Wt 185.6 lb

## 2013-08-28 DIAGNOSIS — E039 Hypothyroidism, unspecified: Secondary | ICD-10-CM

## 2013-08-28 DIAGNOSIS — R55 Syncope and collapse: Secondary | ICD-10-CM

## 2013-08-28 MED ORDER — LEVOTHYROXINE SODIUM 100 MCG PO TABS: 100.0000 ug | ORAL_TABLET | Freq: Every day | ORAL | Status: DC

## 2013-08-28 NOTE — Progress Notes (Signed)
  Subjective:    Sarah Salazar is a 58 y.o. female who presents for evaluation of syncope. Onset was 3 weeks ago. Symptoms have  gradually improved since that time. Patient describes the episode as syncopal episode occurred within 3 minutes of arising from sitting position. Associated symptoms: none. The patient denies abdominal pain, diarrhea, excessive thirst, general feeling of lightheadedness, headache, heavy menstrual bleeding, history of CAD, melena, nausea, tachycardia/palpitations and visual aura. Medications putting patient at risk for syncope: hypotensive medications.  Pt arose having CP---pt was transferred to Er and admitted for 2-3 days.   Cardiology admitted her and w/u normal.    The following portions of the patient's history were reviewed and updated as appropriate: allergies, current medications, past family history, past medical history, past social history, past surgical history and problem list.  Review of Systems Pertinent items are noted in HPI.   Objective:    BP 126/80  Pulse 58  Temp(Src) 99 F (37.2 C) (Oral)  Wt 185 lb 9.6 oz (84.188 kg)  SpO2 97% General appearance: alert, cooperative, appears stated age and no distress Eyes: conjunctivae/corneas clear. PERRL, EOM's intact. Fundi benign. Ears: normal TM's and external ear canals both ears Nose: Nares normal. Septum midline. Mucosa normal. No drainage or sinus tenderness. Throat: lips, mucosa, and tongue normal; teeth and gums normal Neck: no adenopathy, no carotid bruit, no JVD, supple, symmetrical, trachea midline and thyroid not enlarged, symmetric, no tenderness/mass/nodules Lungs: clear to auscultation bilaterally Heart: S1, S2 normal Extremities: extremities normal, atraumatic, no cyanosis or edema Neurologic: Alert and oriented X 3, normal strength and tone. Normal symmetric reflexes. Normal coordination and gait  Cardiographics ECG: not done today-- see hospital records   Assessment:    Loss of  consciousness of uncertain cause   Plan:    thought to be vasovagal in hospital  Pt states her boyfriend told her she was was completely out of it for a while after episode.  This is second or third episode in last few years.   F/u neuro--- ? Seizure Carotid doppler

## 2013-08-28 NOTE — Patient Instructions (Signed)
Syncope  Syncope is a fainting spell. This means the person loses consciousness and drops to the ground. The person is generally unconscious for less than 5 minutes. The person may have some muscle twitches for up to 15 seconds before waking up and returning to normal. Syncope occurs more often in elderly people, but it can happen to anyone. While most causes of syncope are not dangerous, syncope can be a sign of a serious medical problem. It is important to seek medical care.   CAUSES   Syncope is caused by a sudden decrease in blood flow to the brain. The specific cause is often not determined. Factors that can trigger syncope include:   Taking medicines that lower blood pressure.   Sudden changes in posture, such as standing up suddenly.   Taking more medicine than prescribed.   Standing in one place for too long.   Seizure disorders.   Dehydration and excessive exposure to heat.   Low blood sugar (hypoglycemia).   Straining to have a bowel movement.   Heart disease, irregular heartbeat, or other circulatory problems.   Fear, emotional distress, seeing blood, or severe pain.  SYMPTOMS   Right before fainting, you may:   Feel dizzy or lightheaded.   Feel nauseous.   See all white or all black in your field of vision.   Have cold, clammy skin.  DIAGNOSIS   Your caregiver will ask about your symptoms, perform a physical exam, and perform electrocardiography (ECG) to record the electrical activity of your heart. Your caregiver may also perform other heart or blood tests to determine the cause of your syncope.  TREATMENT   In most cases, no treatment is needed. Depending on the cause of your syncope, your caregiver may recommend changing or stopping some of your medicines.  HOME CARE INSTRUCTIONS   Have someone stay with you until you feel stable.   Do not drive, operate machinery, or play sports until your caregiver says it is okay.   Keep all follow-up appointments as directed by your  caregiver.   Lie down right away if you start feeling like you might faint. Breathe deeply and steadily. Wait until all the symptoms have passed.   Drink enough fluids to keep your urine clear or pale yellow.   If you are taking blood pressure or heart medicine, get up slowly, taking several minutes to sit and then stand. This can reduce dizziness.  SEEK IMMEDIATE MEDICAL CARE IF:    You have a severe headache.   You have unusual pain in the chest, abdomen, or back.   You are bleeding from the mouth or rectum, or you have black or tarry stool.   You have an irregular or very fast heartbeat.   You have pain with breathing.   You have repeated fainting or seizure-like jerking during an episode.   You faint when sitting or lying down.   You have confusion.   You have difficulty walking.   You have severe weakness.   You have vision problems.  If you fainted, call your local emergency services (911 in [place] Do not drive yourself to the hospital.   MAKE SURE YOU:   Understand these instructions.   Will watch your condition.   Will get help right away if you are not doing well or get worse.  Document Released: 04/19/2005 Document Revised: 10/19/2011 Document Reviewed: 06/18/2011  [employer] Patient Information 2014 [employer].

## 2013-08-28 NOTE — Progress Notes (Signed)
Pre visit review using our clinic review tool, if applicable. No additional management support is needed unless otherwise documented below in the visit note. 

## 2013-08-30 ENCOUNTER — Ambulatory Visit (HOSPITAL_BASED_OUTPATIENT_CLINIC_OR_DEPARTMENT_OTHER): Payer: Self-pay

## 2013-08-31 ENCOUNTER — Telehealth: Payer: Self-pay | Admitting: Family Medicine

## 2013-08-31 NOTE — Telephone Encounter (Signed)
Does she need note to say 40 hrs

## 2013-08-31 NOTE — Telephone Encounter (Signed)
Please advise if this is okay?

## 2013-08-31 NOTE — Telephone Encounter (Signed)
The note needs to say: first shift Monday-Friday with a maximum of 40 hours in the work week.

## 2013-08-31 NOTE — Telephone Encounter (Signed)
Caller name: Lidiya Relation to pt: self Call back number:[phone]   Reason for call:  Pt works a rotation of 7 to 9 days in a row for work and is needing a letter for work so she can be put on work restrictions :  First shift, Monday - Friday.  Please contact

## 2013-08-31 NOTE — Telephone Encounter (Signed)
Ok to do note

## 2013-09-03 NOTE — Telephone Encounter (Signed)
Letter printed and pt made aware it was ready to be picked up.

## 2013-09-04 ENCOUNTER — Telehealth: Payer: Self-pay | Admitting: *Deleted

## 2013-09-04 NOTE — Telephone Encounter (Signed)
Spoke with patient when she walked into the office stating that the letter we provided for her was incorrect. Letter changed to state pt may only work Monday through Friday, first shift only per pt request.

## 2013-09-05 ENCOUNTER — Telehealth: Payer: Self-pay | Admitting: Family Medicine

## 2013-09-05 NOTE — Telephone Encounter (Signed)
claritin or allegra ----- with flonase or nasacort otc

## 2013-09-05 NOTE — Telephone Encounter (Signed)
Please advise      KP 

## 2013-09-05 NOTE — Telephone Encounter (Signed)
Caller name:Chelcey Summerfield Relation to DU:[id number] Call back number:[phone] Pharmacy:  Reason for call: patient called stating that she has allergies and would like to know what can she take while on metoprolol that does not make her drowse. Please advise.

## 2013-09-06 NOTE — Telephone Encounter (Signed)
Patient has been made aware and voiced understanding. She is also having issues with her BP med's and having nausea, BP is still fluctuating as well. I scheduled the patient to come in tomorrow to eval.     K

## 2013-09-07 ENCOUNTER — Ambulatory Visit: Payer: Self-pay | Admitting: Family Medicine

## 2013-09-11 ENCOUNTER — Encounter: Payer: Self-pay | Admitting: Family Medicine

## 2013-09-11 ENCOUNTER — Ambulatory Visit (INDEPENDENT_AMBULATORY_CARE_PROVIDER_SITE_OTHER): Payer: Self-pay | Admitting: Family Medicine

## 2013-09-11 VITALS — BP 118/80 | HR 74 | Temp 98.4°F | Wt 183.0 lb

## 2013-09-11 DIAGNOSIS — R079 Chest pain, unspecified: Secondary | ICD-10-CM

## 2013-09-11 DIAGNOSIS — R42 Dizziness and giddiness: Secondary | ICD-10-CM

## 2013-09-11 MED ORDER — METOPROLOL TARTRATE 25 MG PO TABS: 12.5000 mg | ORAL_TABLET | Freq: Two times a day (BID) | ORAL | Status: DC

## 2013-09-11 NOTE — Progress Notes (Signed)
  Subjective:     Sarah Salazar is a 58 y.o. female who presents for evaluation of dizziness. The symptoms started 1 week ago and are ongoing. The attacks occur frequently and last several  minutes. Positions that worsen symptoms: standing up and turning head. Previous workup/treatments: none. Associated ear symptoms: none. Associated CNS symptoms: none. Recent infections: none. Head trauma: denied. Drug ingestion: none. Noise exposure: no occupational exposure. Family history: non-contributory.  The following portions of the patient's history were reviewed and updated as appropriate: allergies, current medications, past family history, past medical history, past social history, past surgical history and problem list.  Review of Systems Pertinent items are noted in HPI.    Objective:    BP 118/80  Pulse 74  Temp(Src) 98.4 F (36.9 C) (Oral)  Wt 183 lb (83.008 kg)  SpO2 98% General appearance: alert, cooperative, appears stated age and no distress Throat: lips, mucosa, and tongue normal; teeth and gums normal Neck: no adenopathy, supple, symmetrical, trachea midline and thyroid not enlarged, symmetric, no tenderness/mass/nodules Lungs: clear to auscultation bilaterally Heart: S1, S2 normal Extremities: extremities normal, atraumatic, no cyanosis or edema     ekg-- sinus brady Assessment:    hypotension--- prob cause of dizziness    Plan:    Educational materials given and questions answered. Follow up in 2 weeks.  Lower dose of metoprolol

## 2013-09-11 NOTE — Progress Notes (Signed)
Pre visit review using our clinic review tool, if applicable. No additional management support is needed unless otherwise documented below in the visit note. 

## 2013-09-11 NOTE — Patient Instructions (Signed)

## 2013-09-14 IMAGING — CT CT ANGIO CHEST
2 of 6 series · 19 of 36 positions shown · IV contrast (APPLIED)
Comparison: PA and lateral chest 04/23/2012.

CLINICAL DATA: Pleuritic chest pain for 1 day.

CT ANGIOGRAPHY CHEST
TECHNIQUE: Multidetector CT imaging of the chest using the
standard protocol during bolus administration of intravenous
contrast. Multiplanar reconstructed images including MIPs were
obtained and reviewed to evaluate the vascular anatomy.
Contrast: 80mL OMNIPAQUE IOHEXOL 350 MG/ML SOLN

[Series 5: pe 1.0 b26f · axial · 0.75mm/px · z∈[-288,-36]mm · 18 of 281 slices shown]
[im 15/281  lung]
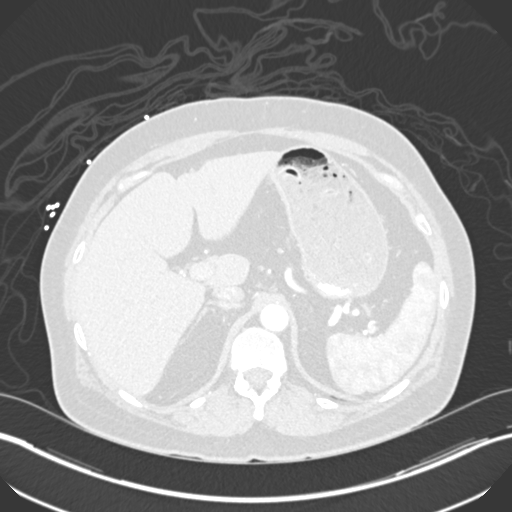
[im 29/281  mediastinal]
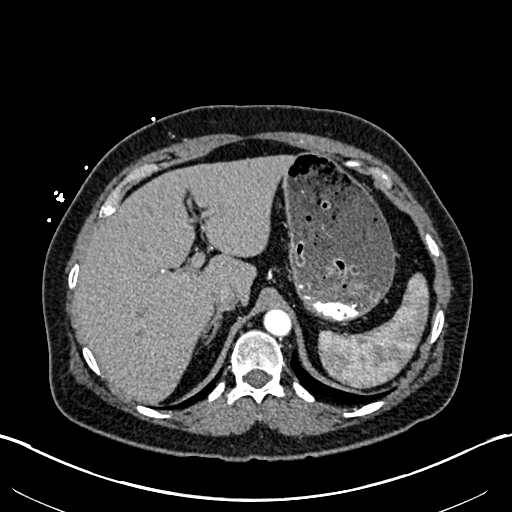
[im 43/281  lung]
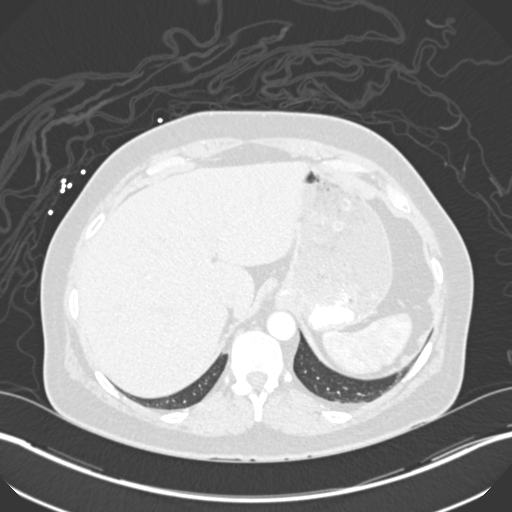
[im 57/281  mediastinal]
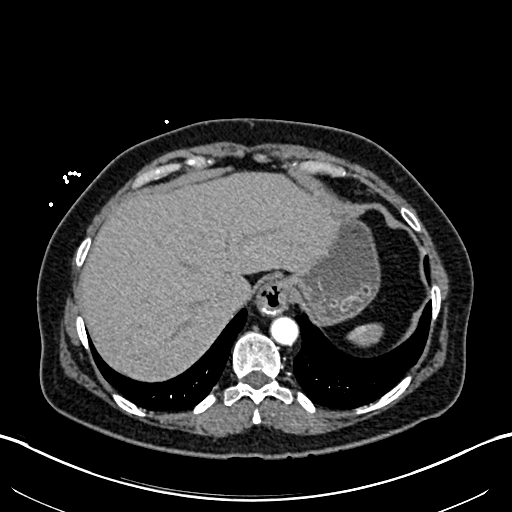
[im 71/281  lung]
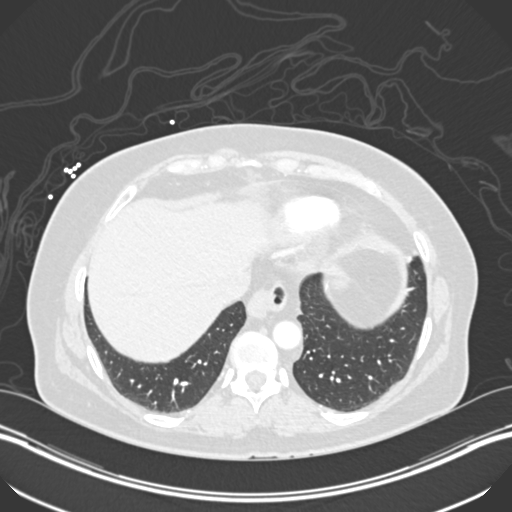
[im 85/281  mediastinal]
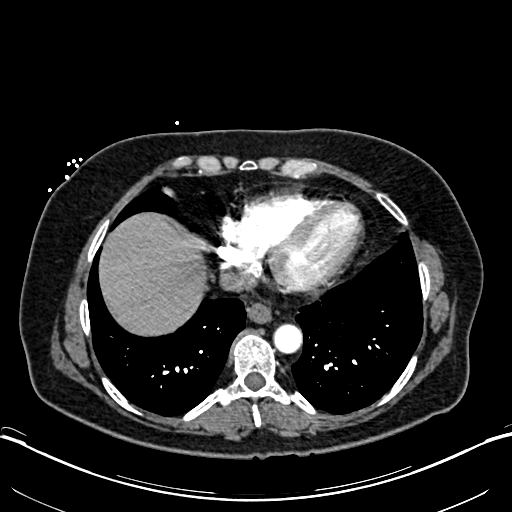
[im 99/281  lung]
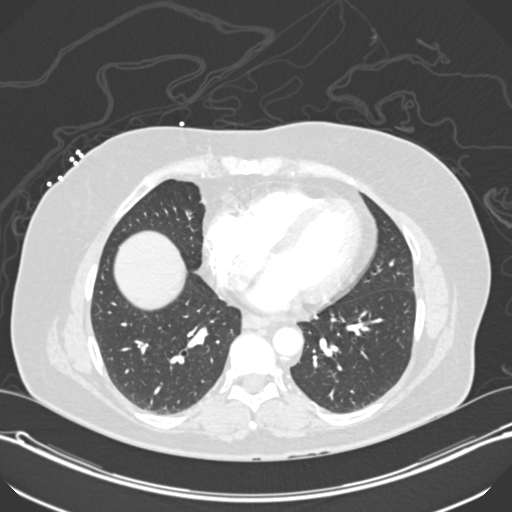
[im 113/281  mediastinal]
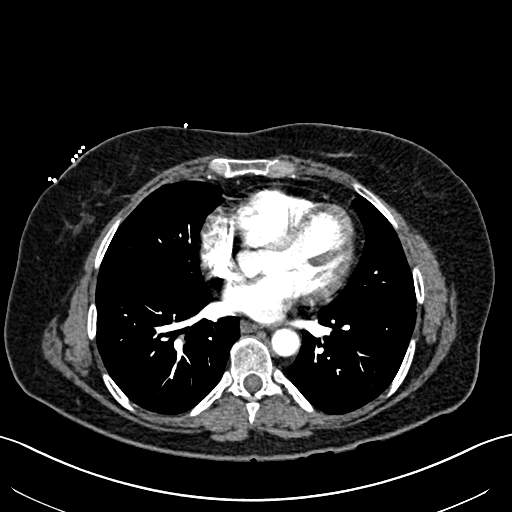
[im 127/281  lung]
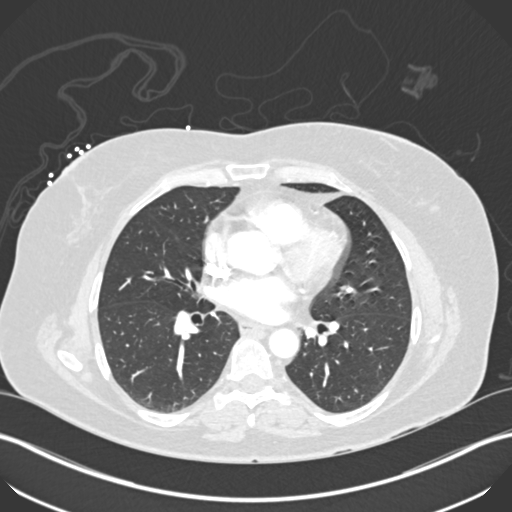
[im 155/281  mediastinal]
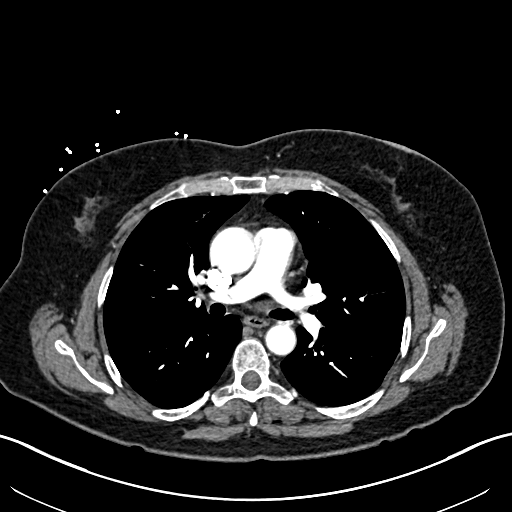
[im 169/281  lung]
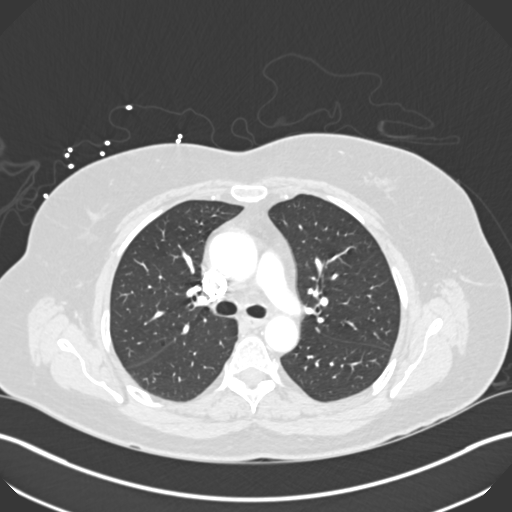
[im 183/281  mediastinal]
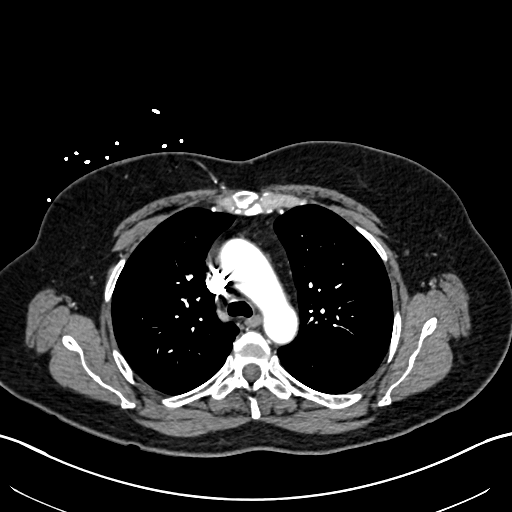
[im 197/281  lung]
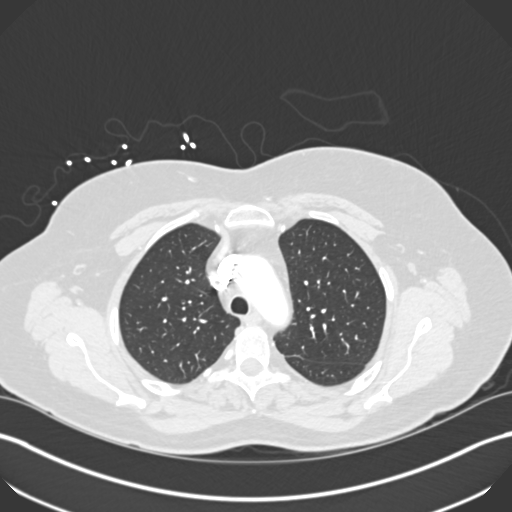
[im 211/281  mediastinal]
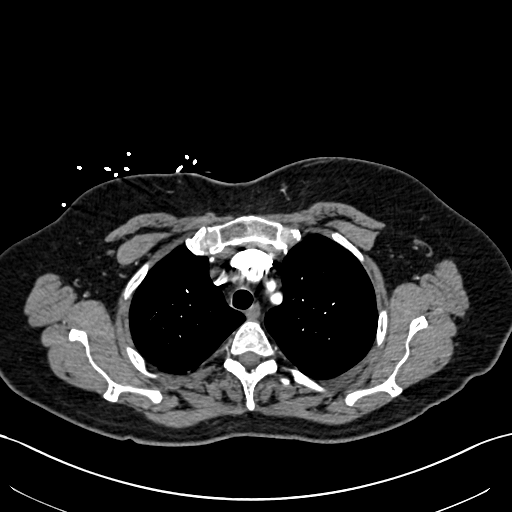
[im 225/281  lung]
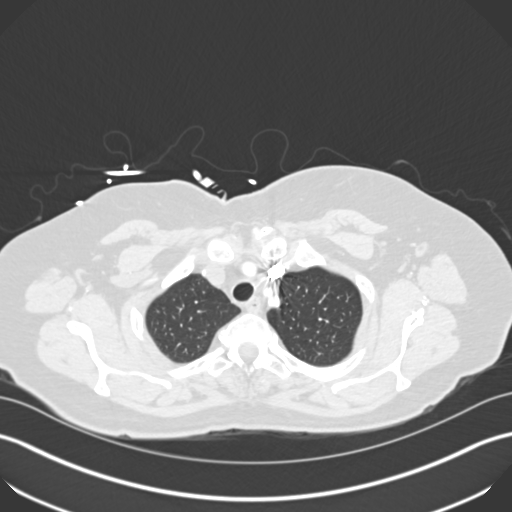
[im 239/281  mediastinal]
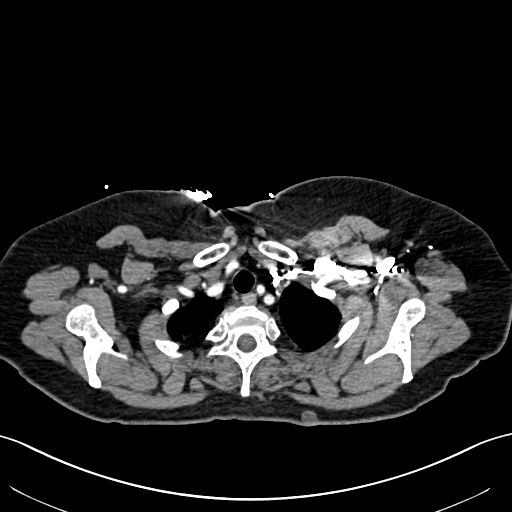
[im 253/281  lung]
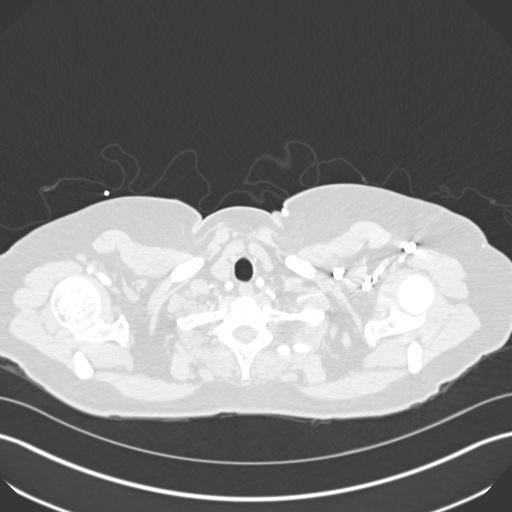
[im 267/281  mediastinal]
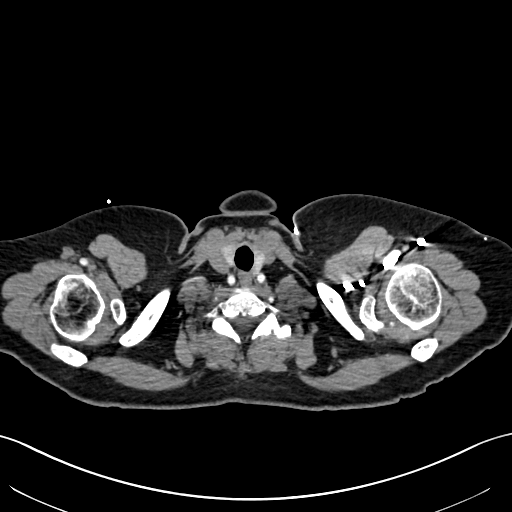

[Series 8: pe 2.0 coronal · coronal · 0.60mm/px · 1 of 112 slices shown]
[im 56/112  mediastinal]
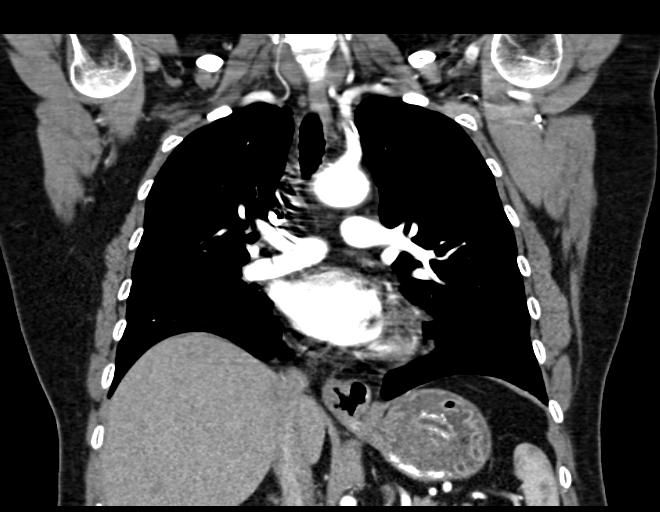

[19 of 36 positions shown; findings below may reference images not displayed]

FINDINGS: No pulmonary embolus is identified.  Very small hiatal
hernia is noted.  Heart size is normal.  There is no axillary,
hilar or mediastinal lymphadenopathy.  The lungs are clear.
Incidentally imaged upper abdomen shows postoperative change of
cholecystectomy.  No focal bony abnormality is identified.
IMPRESSION: 1.  Negative for pulmonary embolus or acute disease.
2.  Very small hiatal hernia.
3.  Status post cholecystectomy.

## 2013-09-18 ENCOUNTER — Encounter: Payer: Self-pay | Admitting: Family Medicine

## 2013-10-05 ENCOUNTER — Ambulatory Visit
Admission: RE | Admit: 2013-10-05 | Discharge: 2013-10-05 | Disposition: A | Discharge status: Home / Self Care | Payer: No Typology Code available for payment source | Source: Ambulatory Visit | Attending: Neurosurgery | Admitting: Neurosurgery

## 2013-10-05 DIAGNOSIS — M5416 Radiculopathy, lumbar region: Secondary | ICD-10-CM

## 2013-10-05 MED ORDER — IOHEXOL 180 MG/ML  SOLN
1.0000 mL | Freq: Once | INTRAMUSCULAR | Status: AC | PRN
  Administered 2013-10-05: 1 mL via EPIDURAL

## 2013-10-05 MED ORDER — METHYLPREDNISOLONE ACETATE 40 MG/ML INJ SUSP (RADIOLOG
120.0000 mg | Freq: Once | INTRAMUSCULAR | Status: AC
  Administered 2013-10-05: 120 mg via EPIDURAL

## 2013-11-26 ENCOUNTER — Encounter: Payer: Self-pay | Admitting: Physician Assistant

## 2013-11-26 ENCOUNTER — Ambulatory Visit (INDEPENDENT_AMBULATORY_CARE_PROVIDER_SITE_OTHER): Payer: Self-pay | Admitting: Physician Assistant

## 2013-11-26 VITALS — BP 150/79 | HR 82 | Temp 98.7°F | Resp 16 | Ht 64.0 in | Wt 186.2 lb

## 2013-11-26 DIAGNOSIS — A088 Other specified intestinal infections: Secondary | ICD-10-CM

## 2013-11-26 DIAGNOSIS — A084 Viral intestinal infection, unspecified: Secondary | ICD-10-CM

## 2013-11-26 NOTE — Patient Instructions (Addendum)
Please obtain labs.  Increase fluid intake.  Take phenergan for nausea.  Ginger tablets or ginger ale will also be beneficial.  Please read information below on a Diet for Diarrhea.  I will call you with your results.  Symptoms should continue to improve.  Nausea will likely be the last symptoms to go away.  Make sure you are staying well hydrated.  Reintroduce solid foods starting with bland, boring foods like bananas, rice, applesauce and toast as they are easier for your GI tract to breakdown.  Add a probiotic containing bifidobacterium infantis.  Food Choices to Help Relieve Diarrhea When you have diarrhea, the foods you eat and your eating habits are very important. Choosing the right foods and drinks can help relieve diarrhea. Also, because diarrhea can last up to 7 days, you need to replace lost fluids and electrolytes (such as sodium, potassium, and chloride) in order to help prevent dehydration.  WHAT GENERAL GUIDELINES DO I NEED TO FOLLOW?  Slowly drink 1 cup (8 oz) of fluid for each episode of diarrhea. If you are getting enough fluid, your urine will be clear or pale yellow.  Eat starchy foods. Some good choices include white rice, white toast, pasta, low-fiber cereal, baked potatoes (without the skin), saltine crackers, and bagels.  Avoid large servings of any cooked vegetables.  Limit fruit to two servings per day. A serving is  cup or 1 small piece.  Choose foods with less than 2 g of fiber per serving.  Limit fats to less than 8 tsp (38 g) per day.  Avoid fried foods.  Eat foods that have probiotics in them. Probiotics can be found in certain dairy products.  Avoid foods and beverages that may increase the speed at which food moves through the stomach and intestines (gastrointestinal tract). Things to avoid include:  High-fiber foods, such as dried fruit, raw fruits and vegetables, nuts, seeds, and whole grain foods.  Spicy foods and high-fat foods.  Foods and beverages  sweetened with high-fructose corn syrup, honey, or sugar alcohols such as xylitol, sorbitol, and mannitol. WHAT FOODS ARE RECOMMENDED? Grains White rice. White, [place], or pita breads (fresh or toasted), including plain rolls, buns, or bagels. White pasta. Saltine, soda, or graham crackers. Pretzels. Low-fiber cereal. Cooked cereals made with water (such as cornmeal, farina, or cream cereals). Plain muffins. Matzo. Melba toast. Zwieback.  Vegetables Potatoes (without the skin). Strained tomato and vegetable juices. Most well-cooked and canned vegetables without seeds. Tender lettuce. Fruits Cooked or canned applesauce, apricots, cherries, fruit cocktail, grapefruit, peaches, pears, or plums. Fresh bananas, apples without skin, cherries, grapes, cantaloupe, grapefruit, peaches, oranges, or plums.  Meat and Other Protein Products Baked or boiled chicken. Eggs. Tofu. Fish. Seafood. Smooth peanut butter. Ground or well-cooked tender beef, ham, veal, lamb, pork, or poultry.  Dairy Plain yogurt, kefir, and unsweetened liquid yogurt. Lactose-free milk, buttermilk, or soy milk. Plain hard cheese. Beverages Sport drinks. Clear broths. Diluted fruit juices (except prune). Regular, caffeine-free sodas such as ginger ale. Water. Decaffeinated teas. Oral rehydration solutions. Sugar-free beverages not sweetened with sugar alcohols. Other Bouillon, broth, or soups made from recommended foods.  The items listed above may not be a complete list of recommended foods or beverages. Contact your dietitian for more options. WHAT FOODS ARE NOT RECOMMENDED? Grains Whole grain, whole wheat, bran, or rye breads, rolls, pastas, crackers, and cereals. Wild or brown rice. Cereals that contain more than 2 g of fiber per serving. Corn tortillas or taco shells. Cooked or  dry oatmeal. Granola. Popcorn. Vegetables Raw vegetables. Cabbage, broccoli, Brussels sprouts, artichokes, baked beans, beet greens, corn, kale, legumes,  peas, sweet potatoes, and yams. Potato skins. Cooked spinach and cabbage. Fruits Dried fruit, including raisins and dates. Raw fruits. Stewed or dried prunes. Fresh apples with skin, apricots, mangoes, pears, raspberries, and strawberries.  Meat and Other Protein Products Chunky peanut butter. Nuts and seeds. Beans and lentils. Berniece Salines.  Dairy High-fat cheeses. Milk, chocolate milk, and beverages made with milk, such as milk shakes. Cream. Ice cream. Sweets and Desserts Sweet rolls, doughnuts, and sweet breads. Pancakes and waffles. Fats and Oils Butter. Cream sauces. Margarine. Salad oils. Plain salad dressings. Olives. Avocados.  Beverages Caffeinated beverages (such as coffee, tea, soda, or energy drinks). Alcoholic beverages. Fruit juices with pulp. Prune juice. Soft drinks sweetened with high-fructose corn syrup or sugar alcohols. Other Coconut. Hot sauce. Chili powder. Mayonnaise. Gravy. Cream-based or milk-based soups.  The items listed above may not be a complete list of foods and beverages to avoid. Contact your dietitian for more information. WHAT SHOULD I DO IF I BECOME DEHYDRATED? Diarrhea can sometimes lead to dehydration. Signs of dehydration include dark urine and dry mouth and skin. If you think you are dehydrated, you should rehydrate with an oral rehydration solution. These solutions can be purchased at pharmacies, retail stores, or online.  Drink -1 cup (120-240 mL) of oral rehydration solution each time you have an episode of diarrhea. If drinking this amount makes your diarrhea worse, try drinking smaller amounts more often. For example, drink 1-3 tsp (5-15 mL) every 5-10 minutes.  A general rule for staying hydrated is to drink 1-2 L of fluid per day. Talk to your health care provider about the specific amount you should be drinking each day. Drink enough fluids to keep your urine clear or pale yellow. Document Released: 07/10/2003 Document Revised: 04/24/2013 Document  Reviewed: 03/12/2013 [facility] Patient Information 2015 [place], [place]. This information is not intended to replace advice given to you by your health care provider. Make sure you discuss any questions you have with your health care provider.

## 2013-11-26 NOTE — Progress Notes (Signed)
Pre visit review using our clinic review tool, if applicable. No additional management support is needed unless otherwise documented below in the visit note/SLS  

## 2013-11-26 NOTE — Progress Notes (Signed)
Patient presents to clinic today nausea, loose stools and abdominal cramping x 4-5 days.  Denies recent travel.  States her boyfriend has had similar symptoms.  Denies emesis.  Denies fever, chills.  Appetite has returned.  Is staying hydrated.  Endorses mild, intermittent headache.  Denies tenesmus, melena or hematochezia.  Denies new or abnormal food or water source.  Past Medical History  Diagnosis Date  . IBS (irritable bowel syndrome)   . Anxiety   . Depression   . Hypothyroidism     hypothyroidism  . Chest pain     Emergency room April 23, 2012, troponin is normal, chest CT scan showed no pulmonary embolus  . Cough     Chronic cough  . Ejection fraction     EF 55-60%, echo, December, 2013, lipomatous hypertrophy of the atrial septum  . GERD (gastroesophageal reflux disease)   . Personal history of failed moderate sedation 06/20/2012  . Personal history of colonic adenoma 06/28/2012  . Complication of anesthesia     "I'm allergic to versed and fentanyl"  . Family history of anesthesia complication     "hard time waking daughter up post SVT ablation"  . Pneumonia     "couple times" (08/01/2013)  . H/O hiatal hernia   . Migraine     "once or twice" (08/01/2013)  . Bulging lumbar disc     "L3-5" (08/01/2013)  . Bipolar disorder     Current Outpatient Prescriptions on File Prior to Visit  Medication Sig Dispense Refill  . buPROPion (WELLBUTRIN) 100 MG tablet Take 100 mg by mouth at bedtime.      Marland Kitchen ibuprofen (ADVIL,MOTRIN) 800 MG tablet Take 1 tablet (800 mg total) by mouth 3 (three) times daily.  21 tablet  0  . lamoTRIgine (LAMICTAL) 150 MG tablet Take 150 mg by mouth at bedtime.      Marland Kitchen levothyroxine (SYNTHROID, LEVOTHROID) 100 MCG tablet Take 1 tablet (100 mcg total) by mouth daily.  90 tablet  1  . metoprolol tartrate (LOPRESSOR) 25 MG tablet Take 0.5 tablets (12.5 mg total) by mouth 2 (two) times daily.  60 tablet  3  . promethazine (PHENERGAN) 12.5 MG tablet Take 1 tablet  (12.5 mg total) by mouth every 6 (six) hours as needed for nausea or vomiting.  30 tablet  0  . traMADol (ULTRAM) 50 MG tablet Take 1 tablet (50 mg total) by mouth every 6 (six) hours as needed for pain.  30 tablet  1   No current facility-administered medications on file prior to visit.    Allergies  Allergen Reactions  . Amoxicillin Rash       . Benzonatate Rash    Tessalon Perles and amoxicillin were being taken simultaneously when she developed a rash  . Fentanyl Rash    given with Versed  . Midazolam Rash    given with Fentanyl  . Oxycodone-Aspirin Rash  . Sulfonamide Derivatives Rash    rash  . Hydrocodone Hives and Nausea And Vomiting  . Nalbuphine Nausea And Vomiting  . Oxycodone-Acetaminophen Hives and Nausea And Vomiting  . Venlafaxine Other (See Comments)    Patient says makes her crazy  . Verapamil     Caused "pounding " in chest    Family History  Problem Relation Age of Onset  . Coronary artery disease Mother   . Stroke Mother     TIA  . Uterine cancer Mother     BREAST  . Lung cancer Father     LUNG  .  Heart disease Father   . Colon cancer Paternal Uncle     COLON; STOMACH  . COPD Father   . Thyroid disease Mother     goiter  . Lung cancer Maternal Grandfather   . Coronary artery disease Father   . Rheum arthritis Maternal Aunt   . Supraventricular tachycardia Daughter     S/P ablation    History   Social History  . Marital Status: Divorced    Spouse Name: N/A    Number of Children: N/A  . Years of Education: N/A   Occupational History  . [employer]    Social History Main Topics  . Smoking status: Never Smoker   . Smokeless tobacco: Never Used  . Alcohol Use: Yes     Comment: 08/01/2013 "glass of wine 6 times/year at most"  . Drug Use: No  . Sexual Activity: Yes   Other Topics Concern  . None   Social History Narrative   Divorced, has boyfriend   1 daughter   Patient accounts at [employer]   1 caffeinated beverage/day             Review of Systems - See HPI.  All other ROS are negative.  BP 150/79  Pulse 82  Temp(Src) 98.7 F (37.1 C) (Oral)  Resp 16  Ht 5\' 4"  (1.626 m)  Wt 186 lb 4 oz (84.482 kg)  BMI 31.95 kg/m2  SpO2 97%  Physical Exam  Vitals reviewed. Constitutional: She is well-developed, well-nourished, and in no distress.  HENT:  Head: Normocephalic and atraumatic.  Eyes: Conjunctivae are normal.  Neck: Neck supple.  Cardiovascular: Normal rate, regular rhythm, normal heart sounds and intact distal pulses.   Pulmonary/Chest: Effort normal and breath sounds normal. No respiratory distress. She has no wheezes. She has no rales.  Abdominal: Soft. She exhibits no distension and no mass. There is no rebound and no guarding.  Bowel sounds present in all four quadrants and mildly hyperactive.  Positive diffuse tenderness noted to deep palpation.  No focal tenderness, guarding or rebound noted.  Skin: Skin is warm and dry. No rash noted.   Assessment/Plan: Viral gastroenteritis Likely exacerbated by history of IBS.  Will obtain CBC and CMP to assess hydration and electrolyte status. Increase fluids.  Diet for diarrhea discussed.  Phenergan for nausea.  Start a daily probiotic. Recommend Align. Symptoms should continue to improve.  Return precautions discussed with patient.

## 2013-11-26 NOTE — Assessment & Plan Note (Signed)
Likely exacerbated by history of IBS.  Will obtain CBC and CMP to assess hydration and electrolyte status. Increase fluids.  Diet for diarrhea discussed.  Phenergan for nausea.  Start a daily probiotic. Recommend Align. Symptoms should continue to improve.  Return precautions discussed with patient.

## 2013-11-27 LAB — COMPREHENSIVE METABOLIC PANEL
ALT: 21 U/L (ref 0–35)
AST: 21 U/L (ref 0–37)
Albumin: 3.9 g/dL (ref 3.5–5.2)
Alkaline Phosphatase: 91 U/L (ref 39–117)
BUN: 11 mg/dL (ref 6–23)
CO2: 27 mEq/L (ref 19–32)
Calcium: 9.1 mg/dL (ref 8.4–10.5)
Chloride: 106 mEq/L (ref 96–112)
Creatinine, Ser: 1 mg/dL (ref 0.4–1.2)
GFR: 61 mL/min (ref >60.00)
Glucose, Bld: 87 mg/dL (ref 70–99)
Potassium: 3.5 mEq/L (ref 3.5–5.1)
Sodium: 139 mEq/L (ref 135–145)
Total Bilirubin: 0.5 mg/dL (ref 0.2–1.2)
Total Protein: 7.3 g/dL (ref 6.0–8.3)

## 2013-11-27 LAB — CBC
HCT: 39 % (ref 36.0–46.0)
Hemoglobin: 13 g/dL (ref 12.0–15.0)
MCHC: 33.3 g/dL (ref 30.0–36.0)
MCV: 93.3 fl (ref 78.0–100.0)
Platelets: 179 10*3/uL (ref 150.0–400.0)
RBC: 4.18 Mil/uL (ref 3.87–5.11)
RDW: 14.3 % (ref 11.5–15.5)
WBC: 6.1 10*3/uL (ref 4.0–10.5)

## 2013-11-30 ENCOUNTER — Telehealth: Payer: Self-pay | Admitting: Family Medicine

## 2013-11-30 NOTE — Telephone Encounter (Signed)
Caller name: Nakaila Relation to pt: Call back number:[phone] Pharmacy: [facility] [address]  Reason for call: pt saw Elyn Aquas and was give a work note for Mon-Wed.  She states she was unable to return to work on Thursday or Friday and would like a note for Thursday and Friday.

## 2013-12-03 NOTE — Telephone Encounter (Signed)
Ok to grant her a work note.

## 2013-12-03 NOTE — Telephone Encounter (Signed)
Please Advise

## 2013-12-11 ENCOUNTER — Other Ambulatory Visit: Payer: Self-pay | Admitting: Neurosurgery

## 2013-12-11 DIAGNOSIS — M5416 Radiculopathy, lumbar region: Secondary | ICD-10-CM

## 2013-12-12 ENCOUNTER — Ambulatory Visit
Admission: RE | Admit: 2013-12-12 | Discharge: 2013-12-12 | Disposition: A | Discharge status: Home / Self Care | Payer: No Typology Code available for payment source | Source: Ambulatory Visit | Attending: Neurosurgery | Admitting: Neurosurgery

## 2013-12-12 VITALS — BP 176/89 | HR 61

## 2013-12-12 DIAGNOSIS — M5416 Radiculopathy, lumbar region: Secondary | ICD-10-CM

## 2013-12-12 MED ORDER — METHYLPREDNISOLONE ACETATE 40 MG/ML INJ SUSP (RADIOLOG
120.0000 mg | Freq: Once | INTRAMUSCULAR | Status: AC
  Administered 2013-12-12: 120 mg via EPIDURAL

## 2013-12-12 MED ORDER — IOHEXOL 180 MG/ML  SOLN
1.0000 mL | Freq: Once | INTRAMUSCULAR | Status: AC | PRN
  Administered 2013-12-12: 1 mL via EPIDURAL

## 2014-01-10 ENCOUNTER — Telehealth: Payer: Self-pay | Admitting: Pulmonary Disease

## 2014-01-10 NOTE — Telephone Encounter (Signed)
Pt last seen 10/2011.  Called spoke with pt. Made her aware she will need OV before inhaler can be called in. Pt is not on any inhalers and has not been in over 1 1/2 years. She did not want to schedule appt. Will sign off message

## 2014-01-22 ENCOUNTER — Telehealth: Payer: Self-pay | Admitting: Family Medicine

## 2014-01-22 ENCOUNTER — Ambulatory Visit (INDEPENDENT_AMBULATORY_CARE_PROVIDER_SITE_OTHER): Payer: 59 | Admitting: Family Medicine

## 2014-01-22 ENCOUNTER — Encounter: Payer: Self-pay | Admitting: Family Medicine

## 2014-01-22 VITALS — BP 166/95 | HR 108 | Temp 99.5°F | Wt 186.5 lb

## 2014-01-22 DIAGNOSIS — J329 Chronic sinusitis, unspecified: Secondary | ICD-10-CM

## 2014-01-22 MED ORDER — CLARITHROMYCIN ER 500 MG PO TB24: 1000.0000 mg | ORAL_TABLET | Freq: Every day | ORAL | Status: DC

## 2014-01-22 MED ORDER — AZELASTINE-FLUTICASONE 137-50 MCG/ACT NA SUSP: NASAL | Status: DC

## 2014-01-22 NOTE — Telephone Encounter (Signed)
Pt states the rx  Clarithromycin (biaxine xl) the pharmacy does not have it in stock and also the generic with insurance was $99.00 and she tried [facility] and they did not have it in stock either. Pt would like to see if there is something else she can get in place of this meds. States she is not sure about the azelastine-fluticasone (dymista) states she didn't get any further with the pharmacy to find out what the cost was. Please send new rx to [facility],

## 2014-01-22 NOTE — Progress Notes (Signed)
Pre visit review using our clinic review tool, if applicable. No additional management support is needed unless otherwise documented below in the visit note. 

## 2014-01-22 NOTE — Progress Notes (Signed)
  Subjective:     Sarah Salazar is a 58 y.o. female who presents for evaluation of sinus pain. Symptoms include: congestion, cough, facial pain, headaches, nasal congestion, purulent rhinorrhea, sinus pressure and sore throat. Onset of symptoms was 10 days ago. Symptoms have been gradually worsening since that time. Past history is significant for no history of pneumonia or bronchitis. Patient is a non-smoker.  The following portions of the patient's history were reviewed and updated as appropriate: allergies, current medications, past family history, past medical history, past social history, past surgical history and problem list.  Review of Systems Pertinent items are noted in HPI.   Objective:    BP 166/95  Pulse 108  Temp(Src) 99.5 F (37.5 C) (Oral)  Wt 186 lb 8.2 oz (84.6 kg)  SpO2 97% General appearance: alert, cooperative, appears stated age and no distress Ears: normal TM's and external ear canals both ears Nose: green discharge, moderate congestion, turbinates red, swollen, sinus tenderness bilateral Throat: lips, mucosa, and tongue normal; teeth and gums normal Neck: no adenopathy, supple, symmetrical, trachea midline and thyroid not enlarged, symmetric, no tenderness/mass/nodules Lungs: clear to auscultation bilaterally Heart: S1, S2 normal    Assessment:    Acute bacterial sinusitis.    Plan:    Nasal steroids per medication orders. Antihistamines per medication orders. Biaxin per medication orders.

## 2014-01-22 NOTE — Patient Instructions (Signed)

## 2014-01-22 NOTE — Telephone Encounter (Signed)
Please advise      KP 

## 2014-01-22 NOTE — Telephone Encounter (Signed)
astepro 1 spray each nostril bid #1  2 refills Erythromycin 500 mg 1 po bid for 10 days

## 2014-01-23 ENCOUNTER — Telehealth: Payer: Self-pay | Admitting: Family Medicine

## 2014-01-23 MED ORDER — AZELASTINE HCL 0.15 % NA SOLN: 1.0000 | Freq: Two times a day (BID) | NASAL | Status: DC

## 2014-01-23 MED ORDER — ERYTHROMYCIN BASE 500 MG PO TABS: 500.0000 mg | ORAL_TABLET | Freq: Two times a day (BID) | ORAL | Status: DC

## 2014-01-23 MED ORDER — AZITHROMYCIN 250 MG PO TABS: ORAL_TABLET | ORAL | Status: DC

## 2014-01-23 NOTE — Telephone Encounter (Signed)
Ewing Schlein, CMA at 01/23/2014 10:30 AM      Status: Signed            Patient has been made aware that med's are being sent. Per her request [facility] [address]         Synthia Innocent at 01/23/2014 10:23 AM      Status: Signed            Pt calling checking on status of med refill, can she try a zpak?         Rosalita Chessman, DO at 01/22/2014  6:09 PM      Status: Signed            astepro 1 spray each nostril bid #1  2 refills Erythromycin 500 mg 1 po bid for 10 days

## 2014-01-23 NOTE — Telephone Encounter (Signed)
Patient has been made aware that med's are being sent. Per her request [place]

## 2014-01-23 NOTE — Telephone Encounter (Signed)
Agree w/ Zpack as noted

## 2014-01-23 NOTE — Telephone Encounter (Signed)
The patient is requesting the Z-pak, she said the E-mycin is $76 dollars. Please advise     KP  Discussed with Dr.Tabori and verbal given to send in the [place]. Rx sent and the patient is aware.     KP

## 2014-01-23 NOTE — Telephone Encounter (Signed)
Please see previous note  Patient states that zpak was not called in, she states that med from yesterday was called in today.

## 2014-01-23 NOTE — Telephone Encounter (Signed)
Pt calling checking on status of med refill, can she try a zpak?

## 2014-01-24 ENCOUNTER — Telehealth: Payer: Self-pay

## 2014-01-24 NOTE — Telephone Encounter (Signed)
noted 

## 2014-01-24 NOTE — Telephone Encounter (Signed)
Ok to give note for 1 more day

## 2014-01-24 NOTE — Telephone Encounter (Signed)
Please advise if it is ok to have a work note.      KP

## 2014-01-24 NOTE — Telephone Encounter (Signed)
Sarah Salazar Self [phone]  Since she got her medicine late and did not get to start it till yesterday, she was not able to work today, woke up with diarrhea and low grade fever, still not feeling well. She is going to try to go back work.

## 2014-01-25 MED ORDER — TRIAMCINOLONE ACETONIDE 55 MCG/ACT NA AERO: 2.0000 | INHALATION_SPRAY | Freq: Every day | NASAL | Status: DC

## 2014-01-25 NOTE — Telephone Encounter (Signed)
Received Rx rejection from the [facility], the insurance company will not pay. Per Dr.Lowne send in Nasacort 2 prays each nostril qd. Rx sent.         KP    Spoke with patient and she said she is now having Diarrhea, she is still coughing and did not go in to work today, can we extend her note until Monday. Please advise    KP

## 2014-01-25 NOTE — Telephone Encounter (Signed)
Letter printed and left at check in.     KP

## 2014-01-25 NOTE — Telephone Encounter (Signed)
Ok to extend note-- return Monday If not better by then ---OV

## 2014-02-15 ENCOUNTER — Other Ambulatory Visit: Payer: Self-pay

## 2014-02-27 ENCOUNTER — Other Ambulatory Visit: Payer: Self-pay

## 2014-02-27 DIAGNOSIS — R42 Dizziness and giddiness: Secondary | ICD-10-CM

## 2014-02-27 MED ORDER — METOPROLOL TARTRATE 25 MG PO TABS: 12.5000 mg | ORAL_TABLET | Freq: Two times a day (BID) | ORAL | Status: DC

## 2014-04-24 ENCOUNTER — Telehealth: Payer: Self-pay | Admitting: Internal Medicine

## 2014-04-24 NOTE — Telephone Encounter (Signed)
Called patient. Left vm for call back.

## 2014-04-24 NOTE — Telephone Encounter (Signed)
I;m out of office until Monday-- didn't know anything about it---- I;ll forward to our office

## 2014-04-24 NOTE — Telephone Encounter (Signed)
Sarah Salazar , you are her PCP;she fired me.See Encounters in EPIC.Please address her concerns . Thanks, [employer]

## 2014-04-24 NOTE — Telephone Encounter (Signed)
The problem is I do not see any notes about a scar but maybe I missed it.

## 2014-04-24 NOTE — Telephone Encounter (Signed)
Pt called request a note from Dr. Linna Darner stating that she has scare in ear, pt stated she called Dr. Corinna Capra office and they advise her to call Dr. Linna Darner. Please advise.

## 2014-04-25 NOTE — Telephone Encounter (Signed)
LM for patient to return the call.  

## 2014-05-06 NOTE — Telephone Encounter (Signed)
Patient unable to be reached.

## 2014-05-30 ENCOUNTER — Encounter (HOSPITAL_BASED_OUTPATIENT_CLINIC_OR_DEPARTMENT_OTHER): Payer: Self-pay | Admitting: *Deleted

## 2014-05-30 ENCOUNTER — Emergency Department (HOSPITAL_BASED_OUTPATIENT_CLINIC_OR_DEPARTMENT_OTHER)
Admission: EM | Admit: 2014-05-30 | Discharge: 2014-05-30 | Disposition: A | Discharge status: Home / Self Care | Payer: Self-pay | Attending: Emergency Medicine | Admitting: Emergency Medicine

## 2014-05-30 ENCOUNTER — Telehealth: Payer: Self-pay | Admitting: Family Medicine

## 2014-05-30 DIAGNOSIS — Z792 Long term (current) use of antibiotics: Secondary | ICD-10-CM | POA: Insufficient documentation

## 2014-05-30 DIAGNOSIS — Z8719 Personal history of other diseases of the digestive system: Secondary | ICD-10-CM | POA: Insufficient documentation

## 2014-05-30 DIAGNOSIS — R1012 Left upper quadrant pain: Secondary | ICD-10-CM | POA: Insufficient documentation

## 2014-05-30 DIAGNOSIS — Z79899 Other long term (current) drug therapy: Secondary | ICD-10-CM | POA: Insufficient documentation

## 2014-05-30 DIAGNOSIS — E039 Hypothyroidism, unspecified: Secondary | ICD-10-CM | POA: Insufficient documentation

## 2014-05-30 DIAGNOSIS — R197 Diarrhea, unspecified: Secondary | ICD-10-CM | POA: Insufficient documentation

## 2014-05-30 DIAGNOSIS — G43909 Migraine, unspecified, not intractable, without status migrainosus: Secondary | ICD-10-CM | POA: Insufficient documentation

## 2014-05-30 DIAGNOSIS — F319 Bipolar disorder, unspecified: Secondary | ICD-10-CM | POA: Insufficient documentation

## 2014-05-30 DIAGNOSIS — R1013 Epigastric pain: Secondary | ICD-10-CM | POA: Insufficient documentation

## 2014-05-30 DIAGNOSIS — R1011 Right upper quadrant pain: Secondary | ICD-10-CM | POA: Insufficient documentation

## 2014-05-30 DIAGNOSIS — F419 Anxiety disorder, unspecified: Secondary | ICD-10-CM | POA: Insufficient documentation

## 2014-05-30 DIAGNOSIS — Z8739 Personal history of other diseases of the musculoskeletal system and connective tissue: Secondary | ICD-10-CM | POA: Insufficient documentation

## 2014-05-30 DIAGNOSIS — R112 Nausea with vomiting, unspecified: Secondary | ICD-10-CM | POA: Insufficient documentation

## 2014-05-30 DIAGNOSIS — Z880 Allergy status to penicillin: Secondary | ICD-10-CM | POA: Insufficient documentation

## 2014-05-30 DIAGNOSIS — Z8701 Personal history of pneumonia (recurrent): Secondary | ICD-10-CM | POA: Insufficient documentation

## 2014-05-30 DIAGNOSIS — Z86010 Personal history of colonic polyps: Secondary | ICD-10-CM | POA: Insufficient documentation

## 2014-05-30 LAB — URINE MICROSCOPIC-ADD ON

## 2014-05-30 LAB — URINALYSIS, ROUTINE W REFLEX MICROSCOPIC
Bilirubin Urine: NEGATIVE
Glucose, UA: NEGATIVE mg/dL
Hgb urine dipstick: NEGATIVE
Ketones, ur: NEGATIVE mg/dL
Nitrite: NEGATIVE
Protein, ur: NEGATIVE mg/dL
Specific Gravity, Urine: 1.004 — ABNORMAL LOW (ref 1.005–1.030)
Urobilinogen, UA: 0.2 mg/dL (ref 0.0–1.0)
pH: 6 (ref 5.0–8.0)

## 2014-05-30 MED ORDER — HYOSCYAMINE SULFATE 0.125 MG SL SUBL: 0.1250 mg | SUBLINGUAL_TABLET | SUBLINGUAL | Status: DC | PRN

## 2014-05-30 MED ORDER — PROMETHAZINE HCL 25 MG PO TABS: 25.0000 mg | ORAL_TABLET | Freq: Four times a day (QID) | ORAL | Status: DC | PRN

## 2014-05-30 NOTE — Telephone Encounter (Signed)
Patient Name: Sarah Salazar  DOB: 09-24-1955    Initial Comment Caller states, abd is hard, swollen ,and nauseated after taking an Rx, She was given an Rx for muscle spasms last night. Mild abd pains today.    Nurse Assessment  Nurse: Mechele Dawley, RN, Amy Date/Time Eilene Ghazi Time): 05/30/2014 2:05:37 PM  Confirm and document reason for call. If symptomatic, describe symptoms. ---CALLER STATES THAT SHE HAS A PROBLEM WITH NAUSEA CHRONICALLY. SHE HAD THESE ISSUES AFTER GALL BLADDER REMOVAL - GALLBLADDER SPASM. SHE STATES THAT THE SPASMS ON COME QUICKLY. SHE STATES THAT SHE USES A MEDICATION THAT IS A MUSCLE RELAXER THAT SHE USES SL. SHE STATES THAT WHEN THIS HAPPENS SHE CAN TAKE IT AND THE MEDICATION WILL CAUSE THE SPASM TO SUBSIDE. SHE STATES THAT WHEN SHE WOKE UP SHE HAS THE FEELING OF EATING TOO MUCH. SHE STATES THAT AT HER UPPER ABD - RIGHT AT THE RIB AREA THERE IS A HARDNESS TO IT. SHE STATES THAT NOW SHE IS LAYING DOWN AND IT IS NOT AS HARD AS IT WAS. SHE TAKES HYOSCYAMINE.  Has the patient traveled out of the country within the last 30 days? ---Not Applicable  Does the patient require triage? ---Yes  Related visit to physician within the last 2 weeks? ---No  Does the PT have any chronic conditions? (i.e. diabetes, asthma, etc.) ---Yes  List chronic conditions. ---GALLBLADDER SPASMS, BIPOLAR, LOW POTASSIUM     Guidelines    Guideline Title Affirmed Question Affirmed Notes  Abdominal Pain - Upper Patient sounds very sick or weak to the triager MILD PAIN WITH SHARP PAINS THAT COME AND GO. SEVERE NAUSEA AS WELL   Final Disposition User   Go to ED Now (or PCP triage) Mechele Dawley, RN, Amy

## 2014-05-30 NOTE — Telephone Encounter (Signed)
Patient called to make appointment with Dr. Etter Sjogren.  Patient sounded very weak on the phone.  Stated she had severe abdominal pain that was located above the umbilicus and is intermittent, sharp.  She is unable to breathe when she is having this pain.  She is also experiencing bloating and nausea.  She reports an episode of diarrhea yesterday and states she is not eating.  She spoke with [facility] earlier today who advised her to go to the emergency department.  Patient did not want to go due to cost.  She denies dizziness but sounds weak and anxious on the phone. Recommended to patient to go to the emergency department.  Notified patient that if she would not go, we could try to make her an appointment for tomorrow, but it was my recommendation to go to the emergency room.  Patient stated understanding and said that she would just go to the ED.  eal

## 2014-05-30 NOTE — ED Notes (Signed)
MD at bedside. 

## 2014-05-30 NOTE — ED Provider Notes (Signed)
CSN: [id number]     Arrival date & time 05/30/14  1805 History  This chart was scribe for Sarah Cable, MD by Judithann Sauger, ED Scribe. The patient was seen in room MH07/MH07 and the patient's care was started at 6:31 PM.    Chief Complaint  Patient presents with  . Abdominal Pain   Patient is a 59 y.o. female presenting with abdominal pain. The history is provided by the patient. No language interpreter was used.  Abdominal Pain Pain location:  RUQ, LUQ and epigastric Pain radiates to:  Does not radiate Pain severity:  Severe Onset quality:  Sudden Duration:  1 day Timing:  Constant Progression:  Worsening Chronicity:  Recurrent Worsened by:  Eating Associated symptoms: diarrhea, nausea and vomiting   Associated symptoms: no chest pain   Diarrhea:    Quality:  Watery   Severity:  Moderate   Duration:  2 days   Timing:  Intermittent   Progression:  Resolved Nausea:    Severity:  Moderate   Onset quality:  Sudden   Duration:  1 day   Timing:  Constant   Progression:  Resolved Vomiting:    Severity:  Moderate   Duration:  1 day   Timing:  Intermittent  HPI Comments: Sarah Salazar is a 59 y.o. female who presents to the Emergency Department complaining of a constant sudden onset of an upper abdominal pain onset last night. She reports associated nausea and vomitting. She denies hematemesis. She states that she had diarrhea about 2 days ago but denies blood in it. She denies CP and diaphoresis. She explains that her symptoms have been intermittent ever since she had her gall bladder taken out about 8 years ago. She adds that she has had about 4-5 episodes of green BM within the last 4 months. She reports that last night, the pain was very intense as she had tears in her eye but the pain has since resolved after taking Hyoscyamine. She explains her pain as if someone had a balloon in her stomach and blowing it as it keeps getting bigger. She reports that she used to take  Ibuprofen but not recently. She reports that 2 years ago, she went to see a gastroenterologist but has not been regularly.    She denies any dysuria and denies burning while she urinates. She states that when she has an UTI, she does not show any symptoms.   Past Medical History  Diagnosis Date  . IBS (irritable bowel syndrome)   . Anxiety   . Depression   . Hypothyroidism     hypothyroidism  . Chest pain     Emergency room April 23, 2012, troponin is normal, chest CT scan showed no pulmonary embolus  . Cough     Chronic cough  . Ejection fraction     EF 55-60%, echo, December, 2013, lipomatous hypertrophy of the atrial septum  . GERD (gastroesophageal reflux disease)   . Personal history of failed moderate sedation 06/20/2012  . Personal history of colonic adenoma 06/28/2012  . Complication of anesthesia     "I'm allergic to versed and fentanyl"  . Family history of anesthesia complication     "hard time waking daughter up post SVT ablation"  . Pneumonia     "couple times" (08/01/2013)  . H/O hiatal hernia   . Migraine     "once or twice" (08/01/2013)  . Bulging lumbar disc     "L3-5" (08/01/2013)  . Bipolar disorder  Past Surgical History  Procedure Laterality Date  . Cholecystectomy    . Dilation and curettage of uterus  X 3  . Knee arthroscopy Right X 2  . Wrist surgery  1979-~ 2010    X 4  . Laparoscopy  1970's - 06/03/1990    "several; to evaluate dysfunctional menses & pelvic pain"  . Cystocele repair  01/13/2009  . Esophagogastroduodenoscopy (egd) with esophageal dilation  2004; 2014  . Colonoscopy  2002  . Elbow surgery Left 2005  . Breast lumpectomy Left     "1st breast OR"  . Mastectomy, partial Left     "2nd breast OR", benign fibrocystic changes  . Wisdom tooth extraction      "all 4 at once"   Family History  Problem Relation Age of Onset  . Coronary artery disease Mother   . Stroke Mother     TIA  . Uterine cancer Mother     BREAST  . Lung cancer  Father     LUNG  . Heart disease Father   . Colon cancer Paternal Uncle     COLON; STOMACH  . COPD Father   . Thyroid disease Mother     goiter  . Lung cancer Maternal Grandfather   . Coronary artery disease Father   . Rheum arthritis Maternal Aunt   . Supraventricular tachycardia Daughter     S/P ablation   History  Substance Use Topics  . Smoking status: Never Smoker   . Smokeless tobacco: Never Used  . Alcohol Use: Yes     Comment: 08/01/2013 "glass of wine 6 times/year at most"   OB History    No data available     Review of Systems  Constitutional: Negative for diaphoresis.  Cardiovascular: Negative for chest pain.  Gastrointestinal: Positive for nausea, vomiting, abdominal pain and diarrhea. Negative for blood in stool.  All other systems reviewed and are negative.     Allergies  Amoxicillin; Benzonatate; Fentanyl; Midazolam; Oxycodone-aspirin; Sulfonamide derivatives; Hydrocodone; Nalbuphine; Oxycodone-acetaminophen; Venlafaxine; and Verapamil  Home Medications   Prior to Admission medications   Medication Sig Start Date End Date Taking? Authorizing Provider  azithromycin (ZITHROMAX Z-PAK) 250 MG tablet Take as directed 01/23/14   Rosalita Chessman, DO  buPROPion (WELLBUTRIN) 100 MG tablet Take 100 mg by mouth at bedtime.    Historical Provider, MD  erythromycin base (E-MYCIN) 500 MG tablet Take 1 tablet (500 mg total) by mouth 2 (two) times daily. 01/23/14   Rosalita Chessman, DO  ibuprofen (ADVIL,MOTRIN) 800 MG tablet Take 1 tablet (800 mg total) by mouth 3 (three) times daily. 08/17/12   Rosalita Chessman, DO  lamoTRIgine (LAMICTAL) 150 MG tablet Take 150 mg by mouth at bedtime.    Historical Provider, MD  levothyroxine (SYNTHROID, LEVOTHROID) 100 MCG tablet Take 1 tablet (100 mcg total) by mouth daily. 08/28/13   Rosalita Chessman, DO  metoprolol tartrate (LOPRESSOR) 25 MG tablet Take 0.5 tablets (12.5 mg total) by mouth 2 (two) times daily. 02/27/14   Rosalita Chessman, DO   promethazine (PHENERGAN) 12.5 MG tablet Take 1 tablet (12.5 mg total) by mouth every 6 (six) hours as needed for nausea or vomiting. 05/05/13   Hope Bunnie Pion, NP  traMADol (ULTRAM) 50 MG tablet Take 1 tablet (50 mg total) by mouth every 6 (six) hours as needed for pain. 08/17/12   Rosalita Chessman, DO  triamcinolone (NASACORT AQ) 55 MCG/ACT AERO nasal inhaler Place 2 sprays into the nose daily. 01/25/14  Yvonne R Lowne, DO   BP 145/78 mmHg  Pulse 73  Temp(Src) 98.3 F (36.8 C) (Oral)  Resp 18  Ht 5\' 4"  (1.626 m)  Wt 186 lb (84.369 kg)  BMI 31.91 kg/m2  SpO2 96% Physical Exam  Nursing note and vitals reviewed.  CONSTITUTIONAL: Well developed/well nourished HEAD: Normocephalic/atraumatic EYES: EOMI/PERRL. No scleral icterus.  ENMT: Mucous membranes moist NECK: supple no meningeal signs SPINE/BACK:entire spine nontender CV: S1/S2 noted, no murmurs/rubs/gallops noted LUNGS: Lungs are clear to auscultation bilaterally, no apparent distress ABDOMEN: soft, nontender, no rebound or guarding, bowel sounds noted throughout abdomen GU:no cva tenderness NEURO: Pt is awake/alert/appropriate, moves all extremitiesx4.  No facial droop.   EXTREMITIES: pulses normal/equal, full ROM SKIN: warm, color normal PSYCH: no abnormalities of mood noted, alert and oriented to situation  ED Course  Procedures  DIAGNOSTIC STUDIES: Oxygen Saturation is 96% on RA, adequate by my interpretation.    COORDINATION OF CARE: 6:49 PM- Pt advised of plan for treatment and pt agrees.   Patient well appearing She admits this pain recurs frequently and she has long h/o similar type pain She is feeling improved Denies CP - doubt ACS Her abdomen is soft without focal tenderness - I doubt acute abdominal emergency She declines testing at this time, she only came in as nurse hotline told her to come for evaluation Denies urinary symptoms - urine culture ordered   Labs Review Labs Reviewed  URINALYSIS, ROUTINE W  REFLEX MICROSCOPIC - Abnormal; Notable for the following:    Specific Gravity, Urine 1.004 (*)    Leukocytes, UA LARGE (*)    All other components within normal limits  URINE MICROSCOPIC-ADD ON - Abnormal; Notable for the following:    Bacteria, UA FEW (*)    All other components within normal limits  URINE CULTURE     MDM   Final diagnoses:  Epigastric abdominal pain    Nursing notes including past medical history and social history reviewed and considered in documentation Labs/vital reviewed myself and considered during evaluation Previous records reviewed and considered  I personally performed the services described in this documentation, which was scribed in my presence. The recorded information has been reviewed and is accurate.     Sarah Cable, MD 05/30/14 2012

## 2014-05-30 NOTE — Discharge Instructions (Signed)

## 2014-05-30 NOTE — ED Notes (Signed)
Abdominal pain since last night. Diarrhea 2 days ago. States she had her gallbladder out 10 years ago and has been having abdominal pain on and off since that time.

## 2014-05-31 NOTE — Telephone Encounter (Signed)
She can be seen here

## 2014-05-31 NOTE — Telephone Encounter (Signed)
Appointment made for 06/04/14 at 6:15.

## 2014-05-31 NOTE — Telephone Encounter (Signed)
Please see note below.   Patient did go to ED but was only given Phenergan.  She states that she called Dr. Celesta Aver office (GI) but was told she couldn't get an appointment until April.  She is wondering if she could get a referral for GI or if she should just be seen here.  Please advise.

## 2014-06-01 LAB — URINE CULTURE: Colony Count: >=100000

## 2014-06-01 NOTE — Progress Notes (Signed)
ED Antimicrobial Stewardship Positive Culture Follow Up   Sarah Salazar is an 59 y.o. female who presented to [facility] on 05/30/2014 with a chief complaint of abdominal pain, bloating, diarrhea  Chief Complaint  Patient presents with  . Abdominal Pain    Recent Results (from the past 720 hour(s))  Urine culture     Status: None   Collection Time: 05/30/14  6:17 PM  Result Value Ref Range Status   Specimen Description URINE, CLEAN CATCH  Final   Special Requests NONE  Final   Colony Count   Final    >=100,000 COLONIES/ML Performed at [facility]    Culture   Final    GROUP B STREP(S.AGALACTIAE)ISOLATED Note: TESTING AGAINST S. AGALACTIAE NOT ROUTINELY PERFORMED DUE TO PREDICTABILITY OF AMP/PEN/VAN SUSCEPTIBILITY. Performed at [facility]    Report Status 06/01/2014 FINAL  Final    [x]  No treatment needed  UCx grew out Group B Strep - likely colonizer  New antibiotic prescription: No treatment needed  ED Provider: Carlisle Cater, PA-C   Lawson Radar 06/01/2014, 3:19 PM Infectious Diseases Pharmacist Phone# [phone]

## 2014-06-02 ENCOUNTER — Telehealth (HOSPITAL_COMMUNITY): Payer: Self-pay

## 2014-06-02 NOTE — Telephone Encounter (Signed)
Post ED Visit - Positive Culture Follow-up  Culture report reviewed by antimicrobial stewardship pharmacist: []  [employer], Pharm.D., BCPS []  Heide Guile, Pharm.D., BCPS [x]  Alycia Rossetti, Pharm.D., BCPS []  Alpha, Pharm.D., BCPS, AAHIVP []  Legrand Como, Pharm.D., BCPS, AAHIVP []  Isac Sarna, Pharm.D., BCPS  Positive Urine culture, >/= 100,000 colonies -> Group B Strep Denied urinary symptoms, no further patient follow-up is required at this time.  Dortha Kern 06/02/2014, 11:23 PM

## 2014-06-04 ENCOUNTER — Encounter: Payer: Self-pay | Admitting: Family Medicine

## 2014-06-04 ENCOUNTER — Ambulatory Visit (INDEPENDENT_AMBULATORY_CARE_PROVIDER_SITE_OTHER): Payer: 59 | Admitting: Family Medicine

## 2014-06-04 VITALS — BP 124/80 | HR 73 | Temp 99.0°F | Wt 186.0 lb

## 2014-06-04 DIAGNOSIS — K219 Gastro-esophageal reflux disease without esophagitis: Secondary | ICD-10-CM

## 2014-06-04 DIAGNOSIS — R1013 Epigastric pain: Secondary | ICD-10-CM

## 2014-06-04 DIAGNOSIS — R1030 Lower abdominal pain, unspecified: Secondary | ICD-10-CM

## 2014-06-04 MED ORDER — PANTOPRAZOLE SODIUM 40 MG PO TBEC: 40.0000 mg | DELAYED_RELEASE_TABLET | Freq: Every day | ORAL | Status: DC

## 2014-06-04 MED ORDER — ERYTHROMYCIN BASE 500 MG PO TABS: 500.0000 mg | ORAL_TABLET | Freq: Four times a day (QID) | ORAL | Status: DC

## 2014-06-04 NOTE — Progress Notes (Signed)
Subjective:    Patient ID: Sarah Salazar, female    DOB: [birth year], 57 y.o.   MRN: [id number]  HPI  Patient here for c/o abd pain.  She has been seen by ER but nothing found.  Pt is not taking any otc meds.    Past Medical History  Diagnosis Date  . IBS (irritable bowel syndrome)   . Anxiety   . Depression   . Hypothyroidism     hypothyroidism  . Chest pain     Emergency room April 23, 2012, troponin is normal, chest CT scan showed no pulmonary embolus  . Cough     Chronic cough  . Ejection fraction     EF 55-60%, echo, December, 2013, lipomatous hypertrophy of the atrial septum  . GERD (gastroesophageal reflux disease)   . Personal history of failed moderate sedation 06/20/2012  . Personal history of colonic adenoma 06/28/2012  . Complication of anesthesia     "I'm allergic to versed and fentanyl"  . Family history of anesthesia complication     "hard time waking daughter up post SVT ablation"  . Pneumonia     "couple times" (08/01/2013)  . H/O hiatal hernia   . Migraine     "once or twice" (08/01/2013)  . Bulging lumbar disc     "L3-5" (08/01/2013)  . Bipolar disorder    Current Outpatient Prescriptions  Medication Sig Dispense Refill  . buPROPion (WELLBUTRIN) 100 MG tablet Take 100 mg by mouth at bedtime.    Marland Kitchen erythromycin base (E-MYCIN) 500 MG tablet Take 1 tablet (500 mg total) by mouth 2 (two) times daily. 20 tablet 0  . erythromycin base (E-MYCIN) 500 MG tablet Take 1 tablet (500 mg total) by mouth 4 (four) times daily. 20 tablet 0  . hyoscyamine (LEVSIN SL) 0.125 MG SL tablet Place 1 tablet (0.125 mg total) under the tongue every 4 (four) hours as needed. 30 tablet 0  . lamoTRIgine (LAMICTAL) 150 MG tablet Take 150 mg by mouth at bedtime.    Marland Kitchen levothyroxine (SYNTHROID, LEVOTHROID) 100 MCG tablet Take 1 tablet (100 mcg total) by mouth daily. 90 tablet 1  . metoprolol tartrate (LOPRESSOR) 25 MG tablet Take 0.5 tablets (12.5 mg total) by mouth 2 (two) times daily. 60  tablet 2  . pantoprazole (PROTONIX) 40 MG tablet Take 1 tablet (40 mg total) by mouth daily. 30 tablet 3  . promethazine (PHENERGAN) 25 MG tablet Take 1 tablet (25 mg total) by mouth every 6 (six) hours as needed for nausea or vomiting. 30 tablet 0  . triamcinolone (NASACORT AQ) 55 MCG/ACT AERO nasal inhaler Place 2 sprays into the nose daily. 1 Inhaler 5   No current facility-administered medications for this visit.     Review of Systems  Constitutional: Negative for activity change, appetite change, fatigue and unexpected weight change.  Respiratory: Negative for cough and shortness of breath.   Cardiovascular: Negative for chest pain and palpitations.  Gastrointestinal: Positive for nausea, abdominal pain and diarrhea. Negative for constipation, blood in stool, abdominal distention, anal bleeding and rectal pain.  Psychiatric/Behavioral: Negative for behavioral problems and dysphoric mood. The patient is not nervous/anxious.        Objective:    Physical Exam  Constitutional: She is oriented to person, place, and time. She appears well-developed and well-nourished. No distress.  HENT:  Right Ear: External ear normal.  Left Ear: External ear normal.  Nose: Nose normal.  Mouth/Throat: Oropharynx is clear and moist.  Eyes: EOM  are normal. Pupils are equal, round, and reactive to light.  Neck: Normal range of motion. Neck supple.  Cardiovascular: Normal rate, regular rhythm and normal heart sounds.   No murmur heard. Pulmonary/Chest: Effort normal and breath sounds normal. No respiratory distress. She has no wheezes. She has no rales. She exhibits no tenderness.  Abdominal: Soft. Bowel sounds are normal. She exhibits no distension and no mass. There is tenderness. There is no rebound and no guarding.  + midepigastric tenderness an suprapubic tendernesskk Gi cocktail given with relief of epigasstric tenderness  Neurological: She is alert and oriented to person, place, and time.    Psychiatric: She has a normal mood and affect. Her behavior is normal. Judgment and thought content normal.    BP 124/80 mmHg  Pulse 73  Temp(Src) 99 F (37.2 C) (Oral)  Wt 186 lb (84.369 kg)  SpO2 97% Wt Readings from Last 3 Encounters:  06/04/14 186 lb (84.369 kg)  05/30/14 186 lb (84.369 kg)  01/22/14 186 lb 8.2 oz (84.6 kg)     Lab Results  Component Value Date   WBC 6.1 11/26/2013   HGB 13.0 11/26/2013   HCT 39.0 11/26/2013   PLT 179.0 11/26/2013   GLUCOSE 87 11/26/2013   CHOL 165 08/02/2013   TRIG 143 08/02/2013   HDL 38* 08/02/2013   LDLDIRECT 126.5 10/12/2006   LDLCALC 98 08/02/2013   ALT 21 11/26/2013   AST 21 11/26/2013   NA 139 11/26/2013   K 3.5 11/26/2013   CL 106 11/26/2013   CREATININE 1.0 11/26/2013   BUN 11 11/26/2013   CO2 27 11/26/2013   TSH 1.770 08/01/2013   INR 1.05 08/01/2013   HGBA1C 5.6 08/01/2013    No results found.     Assessment & Plan:   Problem List Items Addressed This Visit    Midepigastric pain    protonix 40 mg qd  GI appointment in April      Relevant Orders   Basic metabolic panel   CBC with Differential/Platelet   Hepatic function panel   H. pylori antibody, IgG    Other Visit Diagnoses    Gastroesophageal reflux disease without esophagitis    -  Primary    Relevant Medications    pantoprazole (PROTONIX) EC tablet    Other Relevant Orders    Basic metabolic panel    CBC with Differential/Platelet    Hepatic function panel    H. pylori antibody, IgG    Lower abdominal pain        Relevant Orders    Basic metabolic panel    CBC with Differential/Platelet    Hepatic function panel    H. pylori antibody, IgG        Garnet Koyanagi, DO

## 2014-06-04 NOTE — Patient Instructions (Signed)

## 2014-06-04 NOTE — Progress Notes (Signed)
Pre visit review using our clinic review tool, if applicable. No additional management support is needed unless otherwise documented below in the visit note. 

## 2014-06-04 NOTE — Assessment & Plan Note (Signed)
protonix 40 mg qd  GI appointment in April

## 2014-06-05 ENCOUNTER — Other Ambulatory Visit: Payer: 59

## 2014-06-05 LAB — HEPATIC FUNCTION PANEL
ALT: 23 U/L (ref 0–35)
AST: 20 U/L (ref 0–37)
Albumin: 3.9 g/dL (ref 3.5–5.2)
Alkaline Phosphatase: 111 U/L (ref 39–117)
Bilirubin, Direct: 0.1 mg/dL (ref 0.0–0.3)
Total Bilirubin: 0.4 mg/dL (ref 0.2–1.2)
Total Protein: 7 g/dL (ref 6.0–8.3)

## 2014-06-05 LAB — BASIC METABOLIC PANEL
BUN: 15 mg/dL (ref 6–23)
CO2: 30 mEq/L (ref 19–32)
Calcium: 9.1 mg/dL (ref 8.4–10.5)
Chloride: 106 mEq/L (ref 96–112)
Creatinine, Ser: 0.97 mg/dL (ref 0.40–1.20)
GFR: 63.06 mL/min (ref >60.00)
Glucose, Bld: 90 mg/dL (ref 70–99)
Potassium: 3.6 mEq/L (ref 3.5–5.1)
Sodium: 140 mEq/L (ref 135–145)

## 2014-06-05 LAB — CBC WITH DIFFERENTIAL/PLATELET
Basophils Absolute: 0 10*3/uL (ref 0.0–0.1)
Basophils Relative: 0.5 % (ref 0.0–3.0)
Eosinophils Absolute: 0.1 10*3/uL (ref 0.0–0.7)
Eosinophils Relative: 1.4 % (ref 0.0–5.0)
HCT: 39.3 % (ref 36.0–46.0)
Hemoglobin: 13.4 g/dL (ref 12.0–15.0)
Lymphocytes Relative: 18.4 % (ref 12.0–46.0)
Lymphs Abs: 1.7 10*3/uL (ref 0.7–4.0)
MCHC: 34.1 g/dL (ref 30.0–36.0)
MCV: 90.8 fl (ref 78.0–100.0)
Monocytes Absolute: 0.5 10*3/uL (ref 0.1–1.0)
Monocytes Relative: 5.7 % (ref 3.0–12.0)
Neutro Abs: 7 10*3/uL (ref 1.4–7.7)
Neutrophils Relative %: 74 % (ref 43.0–77.0)
Platelets: 174 10*3/uL (ref 150.0–400.0)
RBC: 4.32 Mil/uL (ref 3.87–5.11)
RDW: 14.1 % (ref 11.5–15.5)
WBC: 9.4 10*3/uL (ref 4.0–10.5)

## 2014-06-05 LAB — H. PYLORI ANTIBODY, IGG: H Pylori IgG: NEGATIVE

## 2014-06-06 ENCOUNTER — Telehealth: Payer: Self-pay | Admitting: Family Medicine

## 2014-06-06 MED ORDER — CIPROFLOXACIN HCL 500 MG PO TABS: 500.0000 mg | ORAL_TABLET | Freq: Two times a day (BID) | ORAL | Status: DC

## 2014-06-06 NOTE — Telephone Encounter (Signed)
Patient has been made aware and voiced understanding.     KP 

## 2014-06-06 NOTE — Telephone Encounter (Signed)
Patient stated she now has a sinus inf and wanted to know if the Cipro would work for it. Please advise and Cipro has been sent.      KP

## 2014-06-06 NOTE — Telephone Encounter (Signed)
yes

## 2014-06-06 NOTE — Telephone Encounter (Signed)
Please advise      KP 

## 2014-06-06 NOTE — Telephone Encounter (Signed)
Caller name: Kamryn Relation to pt: self Call back number: [phone] Pharmacy: [facility] [address]  Reason for call:   Patient states that erythromycin base [facility]) is going to cost her $67 and would like something less expensive.  Patient states that she now has tons of green stuff coming out of her nose.

## 2014-06-06 NOTE — Telephone Encounter (Signed)
cipro 250 mg 1 po bid x 3 days Repeat in 1 week

## 2014-06-07 ENCOUNTER — Telehealth: Payer: Self-pay | Admitting: Family Medicine

## 2014-06-07 NOTE — Telephone Encounter (Signed)
Patient is being treated for a UTI with Cipro Bid x's 3 days. She started the Rx yesterday. Please advise       KP

## 2014-06-07 NOTE — Telephone Encounter (Signed)
Ok for note 

## 2014-06-07 NOTE — Telephone Encounter (Signed)
She is being treated for bladder infection-- if symptoms do not improve we will need to recheck urine and culture

## 2014-06-07 NOTE — Telephone Encounter (Signed)
Patient has been made aware and voiced understanding, she needed a work note for yesterday and today. She will be returning on Monday.      KP

## 2014-06-07 NOTE — Telephone Encounter (Signed)
Letter routed to the patient through my-chart and she has been made aware.   KP

## 2014-06-07 NOTE — Telephone Encounter (Signed)
Caller name: Allexis Relation to pt: self Call back number: [phone] Pharmacy:  Reason for call:   Patient states that she is now having rib pain and wants to know if this could be associated with her stomach pain? This is regarding last visit? Patient thinks that she now has a bladder infection.

## 2014-06-12 ENCOUNTER — Other Ambulatory Visit: Payer: Self-pay | Admitting: Family Medicine

## 2014-06-12 ENCOUNTER — Telehealth: Payer: Self-pay | Admitting: Family Medicine

## 2014-06-12 ENCOUNTER — Telehealth: Payer: Self-pay | Admitting: Internal Medicine

## 2014-06-12 DIAGNOSIS — K589 Irritable bowel syndrome without diarrhea: Secondary | ICD-10-CM

## 2014-06-12 NOTE — Telephone Encounter (Signed)
Left message for patient to call back  

## 2014-06-12 NOTE — Telephone Encounter (Signed)
Left a message for call back.  

## 2014-06-12 NOTE — Telephone Encounter (Addendum)
Pt notified and made aware.  Lab appointment scheduled for 06/18/14 @ 11:00 am.

## 2014-06-12 NOTE — Telephone Encounter (Signed)
Caller name:Velardi, Ronelle Relation to FS:FSEL Call back number:[phone] Pharmacy:  Reason for call: pt states she does not have an appt to see the GI doctor until April, pt would like to know if Dr. Etter Sjogren can make arrangements for her to be tested for gluten intolerance.

## 2014-06-12 NOTE — Telephone Encounter (Signed)
Please advise      KP 

## 2014-06-12 NOTE — Telephone Encounter (Signed)
Order in.

## 2014-06-13 NOTE — Telephone Encounter (Signed)
Patient wants an earlier appt for bloating and abdominal pain She will come in and see Amy Esterwood PA 06/18/14 9:00

## 2014-06-18 ENCOUNTER — Other Ambulatory Visit: Payer: 59

## 2014-06-18 ENCOUNTER — Telehealth: Payer: Self-pay | Admitting: Family Medicine

## 2014-06-18 DIAGNOSIS — IMO0002 Reserved for concepts with insufficient information to code with codable children: Secondary | ICD-10-CM

## 2014-06-18 NOTE — Telephone Encounter (Signed)
That is fine---urology referral for cystocele

## 2014-06-18 NOTE — Telephone Encounter (Signed)
note sent through my-chart and the patient has been made aware. She wanted to know if she can go to th Urologist due to bladder mesh and recurrent uti's. She is requesting Tuesday mornings.      KP

## 2014-06-18 NOTE — Telephone Encounter (Signed)
Please advise      KP 

## 2014-06-18 NOTE — Telephone Encounter (Signed)
Ok to give note---- have her send my chart message next time

## 2014-06-18 NOTE — Telephone Encounter (Signed)
Caller name: Shasha Relation to pt: self  Call back number: [phone] after 5:15  Pharmacy:  Reason for call:   Patient was seen in the ED  and is requesting a work note for 1/29-2/2. Patient states that we could not get her in until the 2/2.

## 2014-06-25 ENCOUNTER — Ambulatory Visit (INDEPENDENT_AMBULATORY_CARE_PROVIDER_SITE_OTHER): Payer: 59 | Admitting: Physician Assistant

## 2014-06-25 ENCOUNTER — Encounter: Payer: Self-pay | Admitting: Physician Assistant

## 2014-06-25 ENCOUNTER — Other Ambulatory Visit (INDEPENDENT_AMBULATORY_CARE_PROVIDER_SITE_OTHER): Payer: 59

## 2014-06-25 VITALS — BP 152/90 | HR 60 | Ht 64.0 in | Wt 190.6 lb

## 2014-06-25 DIAGNOSIS — K219 Gastro-esophageal reflux disease without esophagitis: Secondary | ICD-10-CM

## 2014-06-25 DIAGNOSIS — R110 Nausea: Secondary | ICD-10-CM

## 2014-06-25 DIAGNOSIS — R1013 Epigastric pain: Secondary | ICD-10-CM

## 2014-06-25 DIAGNOSIS — R140 Abdominal distension (gaseous): Secondary | ICD-10-CM

## 2014-06-25 LAB — URINALYSIS, ROUTINE W REFLEX MICROSCOPIC
Bilirubin Urine: NEGATIVE
Hgb urine dipstick: NEGATIVE
Ketones, ur: NEGATIVE
Leukocytes, UA: NEGATIVE
Nitrite: NEGATIVE
RBC / HPF: NONE SEEN (ref 0–?)
Specific Gravity, Urine: <=1.005 — AB (ref 1.000–1.030)
Total Protein, Urine: NEGATIVE
Urine Glucose: NEGATIVE
Urobilinogen, UA: 0.2 (ref 0.0–1.0)
pH: 6 (ref 5.0–8.0)

## 2014-06-25 MED ORDER — PANTOPRAZOLE SODIUM 40 MG PO TBEC: 40.0000 mg | DELAYED_RELEASE_TABLET | Freq: Every day | ORAL | Status: DC

## 2014-06-25 NOTE — Progress Notes (Signed)
Patient ID: Sarah Salazar, female   DOB: 1956-03-18, 59 y.o.   MRN: [id number]   Subjective:    Patient ID: Sarah Salazar, female    DOB: [birth year], 59 y.o.   MRN: [id number]  HPI Sarah Salazar is a 59 year old white female known to Dr. Carlean Salazar. She has history of chronic GERD, diverticulosis, adenomatous colon polyps . She is status post cholecystectomy about  8 years ago. She has history of bipolar disorder, hyperlipidemia, chronic headaches, hypothyroidism,. She had undergone an EGD in 2014 finding of moderately severe reflux esophagitis a small hiatal hernia small gastric erosion and was dilated to 8 [place] for complaints of dysphagia. Colonoscopy at that same time showed moderate diverticulosis and she had one 3 mm sessile polyp which was a tubular adenoma. She comes in today with complaints of episodes of severe abdominal pain which she says she has had infrequently ever since her cholecystectomy. He says these pains are worse than labor pains are located in her upper abdomen associated with abdominal bloating and swelling. She says she had one of these episodes in January on the 29th and then has had persistent aching and upper abdominal discomfort ever since. She has been nauseated and using Phenergan as needed no vomiting. No fever or chills. She says her abdomen gets distended and hard at times she is also concerned about weight been in her abdomen though she says she's not eating hardly anything. No ongoing problems with diarrhea. She denies any aspirin or NSAID use, no EtOH. He was started on proton X about 3 weeks ago and says this may be helping a bit. She is distressed about her symptoms because she recently started a new job and has now missed some days of work. She has also had problems with recurrent urinary tract infections and says she usually gets nauseated with these and doesn't have typical dysuria-type symptoms. She was treated for UTI in early February as well as a course of  Cipro. Patient had labs on 06/05/2014 CBC and hepatic panel and H. pylori antibody were negative. She has not had any recent imaging  Review of Systems.Pertinent positive and negative review of systems were noted in the above HPI section.  All other review of systems was otherwise negative.  Outpatient Encounter Prescriptions as of 06/25/2014  Medication Sig  . buPROPion (WELLBUTRIN) 100 MG tablet Take 100 mg by mouth at bedtime.  . hyoscyamine (LEVSIN SL) 0.125 MG SL tablet Place 1 tablet (0.125 mg total) under the tongue every 4 (four) hours as needed.  . lamoTRIgine (LAMICTAL) 150 MG tablet Take 150 mg by mouth at bedtime.  Marland Kitchen levothyroxine (SYNTHROID, LEVOTHROID) 100 MCG tablet Take 1 tablet (100 mcg total) by mouth daily.  . metoprolol tartrate (LOPRESSOR) 25 MG tablet Take 0.5 tablets (12.5 mg total) by mouth 2 (two) times daily.  . pantoprazole (PROTONIX) 40 MG tablet Take 1 tablet (40 mg total) by mouth daily.  . promethazine (PHENERGAN) 25 MG tablet Take 1 tablet (25 mg total) by mouth every 6 (six) hours as needed for nausea or vomiting.  . [DISCONTINUED] pantoprazole (PROTONIX) 40 MG tablet Take 1 tablet (40 mg total) by mouth daily.  . [DISCONTINUED] ciprofloxacin (CIPRO) 500 MG tablet Take 1 tablet (500 mg total) by mouth 2 (two) times daily.  . [DISCONTINUED] triamcinolone (NASACORT AQ) 55 MCG/ACT AERO nasal inhaler Place 2 sprays into the nose daily.   Allergies  Allergen Reactions  . Amoxicillin Rash       . Benzonatate Rash  Sarah Salazar and amoxicillin were being taken simultaneously when she developed a rash  . Fentanyl Rash    given with Versed  . Midazolam Rash    given with Fentanyl  . Oxycodone-Aspirin Rash  . Sulfonamide Derivatives Rash    rash  . Hydrocodone Hives and Nausea And Vomiting  . Nalbuphine Nausea And Vomiting  . Oxycodone-Acetaminophen Hives and Nausea And Vomiting  . Venlafaxine Other (See Comments)    Patient says makes her crazy  .  Verapamil     Caused "pounding " in chest   Patient Active Problem List   Diagnosis Date Noted  . Midepigastric pain 06/04/2014  . Viral gastroenteritis 11/26/2013  . Syncope 08/01/2013  . Palpitations 05/11/2013  . Obesity (BMI 30-39.9) 04/11/2013  . Personal history of colonic adenoma 06/28/2012  . Personal history of failed moderate sedation 06/20/2012  . Sarah Salazar 06/19/2012  . Ejection fraction   . HTN (hypertension) 05/04/2012  . IBS (irritable bowel syndrome)   . Anxiety   . Depression   . Hypothyroidism   . Migraine   . Precordial pain   . Chronic cough 09/10/2011  . Hallucinations 05/14/2010  . HEADACHE 05/14/2010  . B12 DEFICIENCY 02/17/2010  . BIPOLAR DISORDER UNSPECIFIED 10/23/2009  . HYPOKALEMIA 05/20/2009  . ASTERIXIS 12/20/2006  . HYPERLIPIDEMIA NEC/NOS 10/27/2006  . FASTING HYPERGLYCEMIA 10/27/2006   History   Social History  . Marital Status: Divorced    Spouse Name: N/A  . Number of Children: N/A  . Years of Education: N/A   Occupational History  . [employer]    Social History Main Topics  . Smoking status: Never Smoker   . Smokeless tobacco: Never Used  . Alcohol Use: Yes     Comment: 08/01/2013 "glass of wine 6 times/year at most"  . Drug Use: No  . Sexual Activity: Yes   Other Topics Concern  . Not on file   Social History Narrative   Divorced, has boyfriend   1 daughter   Patient accounts at [employer]   1 caffeinated beverage/day             Ms. Sarah Salazar family history includes COPD in her father; Colon cancer in her paternal uncle; Coronary artery disease in her father and mother; Heart disease in her father; Lung cancer in her father and maternal grandfather; Rheum arthritis in her maternal aunt; Stroke in her mother; Supraventricular tachycardia in her daughter; Thyroid disease in her mother; Uterine cancer in her mother.      Objective:    Filed Vitals:   06/25/14 0857  BP: 152/90  Pulse: 60    Physical Exam   well-developed white female in no acute distress, blood pressure 152/90 pulse 60 height 5 foot 4 weight 190. HEENT; nontraumatic normocephalic EOMI PERRLA sclera anicteric, Supple; no JVD, Cardiovascular; regular rate and rhythm with S1-S2 no murmur or gallop, Pulmonary; clear bilaterally, Abdomen; soft she is tender across the upper abdomen there is some mild guarding no rebound no palpable mass or hepatosplenomegaly laparoscopic cholecystectomy scars, Bowel sounds are present, Rectal ;exam not done, Extremities ;no clubbing cyanosis or edema skin warm and dry, Psych ;mood and affect appropriate     Assessment & Plan:   #1 59 yo female with recurrent episodes of intense upper abdominal pain and nausea followed by weeks of upper abdominal discomfort,bloating and nausea, Etiology is not clear-she is s/p cholecystectomy -r/o choledocholithiasis, pancreatitis, partial obstruction. Symptoms seem out of proportion to what would be expected from IBS though  IBS and/or bacterial overgrowth as a possibility #2 chronic GERD #3 bipolar disorder #4 history of adenomatous colon polyps due for follow-up 2019 #5 chronic headaches # 6 recurrent UTIs  Plan UA/culture Schedule for CT scan of the abdomen and pelvis with contrast Continue Phenergan 12.5 mg by mouth every 6 hours when necessary for nausea Continue pantoprazole 40 mg by mouth daily Continue Levsin sublingual when necessary Add align 1 by mouth daily If CT is negative consider empiric course of Xifaxan  Alfredia Ferguson PA-C 06/25/2014

## 2014-06-25 NOTE — Patient Instructions (Addendum)
Ry some Align, a probiotic. You can get this at your pharmacy, [place], [place], [facility], [facility], [place].  We have given you a coupon. Take the Phenergan 12.5 mg , 1 tab every 6 hours as needed for nausea.  Take Levsin as needed for spasms, abdominal pain.  Please go to the basement level to our lab for a urine test.    You have been scheduled for a CT scan of the abdomen and pelvis at [facility] ([address]---this is in the same building as [facility]).   You are scheduled on Monday 07-01-2014 at 11:30 am . You should arrive at 11:15 am  prior to your appointment time for registration. Please follow the written instructions below on the day of your exam:  WARNING: IF YOU ARE ALLERGIC TO IODINE/X-RAY DYE, PLEASE NOTIFY RADIOLOGY IMMEDIATELY AT [phone]! YOU WILL BE GIVEN A 13 HOUR PREMEDICATION PREP.  1) Do not eat or drink anything after 7:30 am  (4 hours prior to your test) 2) You have been given 2 bottles of oral contrast to drink. The solution may taste  better if refrigerated, but do NOT add ice or any other liquid to this solution. Shake well before drinking.    Drink 1 bottle of contrast @  9:30 am              Drink 1 bottle of contrst @ 10:30 am   You may take any medications as prescribed with a small amount of water except for the following: Metformin, Glucophage, Glucovance, Avandamet, Riomet, Fortamet, Actoplus Met, Janumet, Glumetza or Metaglip. The above medications must be held the day of the exam AND 48 hours after the exam.  The purpose of you drinking the oral contrast is to aid in the visualization of your intestinal tract. The contrast solution may cause some diarrhea. Before your exam is started, you will be given a small amount of fluid to drink. Depending on your individual set of symptoms, you may also receive an intravenous injection of x-ray contrast/dye. Plan on being at [facility] for 30 minutes or long, depending on the type of  exam you are having performed.  If you have any questions regarding your exam or if you need to reschedule, you may call the CT department at [phone] between the hours of 8:00 am and 5:00 pm, Monday-Friday.  ________________________________________________________________________ ;Johnette Abraham

## 2014-06-30 NOTE — Progress Notes (Signed)
Agree with Ms. Esterwood's assessment and plan. Jacayla Nordell E. Daneen Volcy, MD, FACG   

## 2014-07-01 ENCOUNTER — Ambulatory Visit (INDEPENDENT_AMBULATORY_CARE_PROVIDER_SITE_OTHER)
Admission: RE | Admit: 2014-07-01 | Discharge: 2014-07-01 | Disposition: A | Discharge status: Home / Self Care | Payer: 59 | Source: Ambulatory Visit | Attending: Physician Assistant | Admitting: Physician Assistant

## 2014-07-01 DIAGNOSIS — R1013 Epigastric pain: Secondary | ICD-10-CM

## 2014-07-01 DIAGNOSIS — K219 Gastro-esophageal reflux disease without esophagitis: Secondary | ICD-10-CM

## 2014-07-01 MED ORDER — IOHEXOL 300 MG/ML  SOLN
100.0000 mL | Freq: Once | INTRAMUSCULAR | Status: AC | PRN
  Administered 2014-07-01: 100 mL via INTRAVENOUS

## 2014-07-04 ENCOUNTER — Other Ambulatory Visit: Payer: Self-pay

## 2014-07-04 ENCOUNTER — Encounter: Payer: Self-pay | Admitting: Family Medicine

## 2014-07-04 ENCOUNTER — Telehealth: Payer: Self-pay

## 2014-07-04 ENCOUNTER — Other Ambulatory Visit (INDEPENDENT_AMBULATORY_CARE_PROVIDER_SITE_OTHER): Payer: 59

## 2014-07-04 DIAGNOSIS — K589 Irritable bowel syndrome without diarrhea: Secondary | ICD-10-CM

## 2014-07-04 MED ORDER — GLYCOPYRROLATE 2 MG PO TABS: 2.0000 mg | ORAL_TABLET | Freq: Two times a day (BID) | ORAL | Status: DC

## 2014-07-04 NOTE — Telephone Encounter (Signed)
The patient does not agree she could have IBS. She states her pain is random, it is sharp, it is unrelated to her diet and it is located under her right breast where her gallbladder was removed.It began on the day she went for her surgical follow up while she was at the surgeon's office.  She c/o somnolence with Hyomax and cannot take it often but she has been taking it for 6 years.She states it does seem to help. She is willing to try Robinul Forte She wants to know why she was never checked for celiac disease. She states her symptoms are making it difficult to work and she is in danger of losing her job. She works from home and some days she cannot work. Review of her chart reveals her PCP had ordered labs related to this issue, but it appears she did not have them done. Date 06/12/14. Patient states the weather was bad that day, but she is agreeable to contacting her PCP to ask if she can get them done now. Tissue transglutaminase Tissue transglutaminase,Tissue transglutaminase,

## 2014-07-05 LAB — RETICULIN ANTIBODIES, IGA W TITER: Reticulin Ab, IgA: NEGATIVE

## 2014-07-05 LAB — GLIADIN ANTIBODIES, SERUM
Gliadin IgA: 12 Units (ref <20)
Gliadin IgG: 2 Units (ref <20)

## 2014-07-05 LAB — TISSUE TRANSGLUTAMINASE, IGA: Tissue Transglutaminase Ab, IgA: 1 U/mL (ref <4)

## 2014-07-05 NOTE — Telephone Encounter (Signed)
Have her get the labs done, and make her a follow up with GI MD to discuss further

## 2014-07-08 NOTE — Telephone Encounter (Signed)
Left message to call. Needs an appointment with Dr Benson Setting were negative for celiac disease

## 2014-07-09 NOTE — Telephone Encounter (Signed)
Left message for the patient to call.

## 2014-07-09 NOTE — Telephone Encounter (Signed)
Patient advised and scheduled.  

## 2014-07-11 ENCOUNTER — Encounter: Payer: Self-pay | Admitting: Internal Medicine

## 2014-07-11 ENCOUNTER — Ambulatory Visit (INDEPENDENT_AMBULATORY_CARE_PROVIDER_SITE_OTHER): Payer: 59 | Admitting: Internal Medicine

## 2014-07-11 VITALS — BP 110/60 | HR 66 | Ht 64.0 in | Wt 193.4 lb

## 2014-07-11 DIAGNOSIS — K30 Functional dyspepsia: Secondary | ICD-10-CM

## 2014-07-11 DIAGNOSIS — K589 Irritable bowel syndrome without diarrhea: Secondary | ICD-10-CM

## 2014-07-11 MED ORDER — DULOXETINE HCL 30 MG PO CPEP: 60.0000 mg | ORAL_CAPSULE | Freq: Every day | ORAL | Status: DC

## 2014-07-11 MED ORDER — DULOXETINE HCL 60 MG PO CPEP: 60.0000 mg | ORAL_CAPSULE | Freq: Every day | ORAL | Status: DC

## 2014-07-11 NOTE — Progress Notes (Signed)
Subjective:    Patient ID: Sarah Salazar, female    DOB: August 03, 1955, 59 y.o.   MRN: [id number] Cc: abdominal pain HPI The patient has had years of chronic epigastric pain that may radiate to the back, not necessarily related to eating, can be severe and knifelike. She has gone for as long as 2 years without it but it is a recurrent problem lately. She will get severe nausea associated with this as well. She is not describing any problems with her bowel movements. She was in and saw one about physician assistance at the end of last month CT scanning of the abdomen and pelvis, labs were all unrevealing. Promethazine will help her nausea but makes her sleepy. She is on glycopyrrolate 2 mg twice a day is giving her dry mouth and it is not really changed the frequency of the episodes. She uses hyoscyamine it makes her sleepy as well but does provide some relief from the pain. Him time she regurgitates and has some rare vomiting. He says she is not eating much but is gaining weight.  Celiac disease screening, CBC and LFTs kidney function H. pylori serology all negative in February.  Wt Readings from Last 3 Encounters:  07/11/14 193 lb 6.4 oz (87.726 kg)  06/25/14 190 lb 9.6 oz (86.456 kg)  06/04/14 186 lb (84.369 kg)    Allergies  Allergen Reactions  . Amoxicillin Rash       . Benzonatate Rash    Tessalon Perles and amoxicillin were being taken simultaneously when she developed a rash  . Fentanyl Rash    given with Versed  . Midazolam Rash    given with Fentanyl  . Oxycodone-Aspirin Rash  . Sulfonamide Derivatives Rash    rash  . Hydrocodone Hives and Nausea And Vomiting  . Nalbuphine Nausea And Vomiting  . Oxycodone-Acetaminophen Hives and Nausea And Vomiting  . Venlafaxine Other (See Comments)    Patient says makes her crazy  . Verapamil     Caused "pounding " in chest   Outpatient Prescriptions Prior to Visit  Medication Sig Dispense Refill  . buPROPion (WELLBUTRIN) 100 MG  tablet Take 100 mg by mouth at bedtime.    . hyoscyamine (LEVSIN SL) 0.125 MG SL tablet Place 1 tablet (0.125 mg total) under the tongue every 4 (four) hours as needed. 30 tablet 0  . lamoTRIgine (LAMICTAL) 150 MG tablet Take 150 mg by mouth at bedtime.    Marland Kitchen levothyroxine (SYNTHROID, LEVOTHROID) 100 MCG tablet Take 1 tablet (100 mcg total) by mouth daily. 90 tablet 1  . metoprolol tartrate (LOPRESSOR) 25 MG tablet Take 0.5 tablets (12.5 mg total) by mouth 2 (two) times daily. 60 tablet 2  . pantoprazole (PROTONIX) 40 MG tablet Take 1 tablet (40 mg total) by mouth daily. 30 tablet 3  . promethazine (PHENERGAN) 25 MG tablet Take 1 tablet (25 mg total) by mouth every 6 (six) hours as needed for nausea or vomiting. 30 tablet 0  . glycopyrrolate (ROBINUL-FORTE) 2 MG tablet Take 1 tablet (2 mg total) by mouth 2 (two) times daily. 60 tablet 6   No facility-administered medications prior to visit.   Past Medical History  Diagnosis Date  . IBS (irritable bowel syndrome)   . Anxiety   . Depression   . Hypothyroidism     hypothyroidism  . Chest pain     Emergency room April 23, 2012, troponin is normal, chest CT scan showed no pulmonary embolus  . Cough  Chronic cough  . Ejection fraction     EF 55-60%, echo, December, 2013, lipomatous hypertrophy of the atrial septum  . GERD (gastroesophageal reflux disease)   . Personal history of failed moderate sedation 06/20/2012  . Personal history of colonic adenoma 06/28/2012  . Complication of anesthesia     "I'm allergic to versed and fentanyl"  . Family history of anesthesia complication     "hard time waking daughter up post SVT ablation"  . Pneumonia     "couple times" (08/01/2013)  . H/O hiatal hernia   . Migraine     "once or twice" (08/01/2013)  . Bulging lumbar disc     "L3-5" (08/01/2013)  . Bipolar disorder    Past Surgical History  Procedure Laterality Date  . Cholecystectomy    . Dilation and curettage of uterus  X 3  . Knee  arthroscopy Right X 2  . Wrist surgery  1979-~ 2010    X 4  . Laparoscopy  1970's - 06/03/1990    "several; to evaluate dysfunctional menses & pelvic pain"  . Cystocele repair  01/13/2009  . Esophagogastroduodenoscopy (egd) with esophageal dilation  2004; 2014  . Colonoscopy  2002  . Elbow surgery Left 2005  . Breast lumpectomy Left     "1st breast OR"  . Mastectomy, partial Left     "2nd breast OR", benign fibrocystic changes  . Wisdom tooth extraction      "all 4 at once"   Review of Systems Bipolar disorder seems to be under control she says. She has lumbar disc problems, epidural injections last summer.    Objective:   Physical Exam BP 110/60 mmHg  Pulse 66  Ht 5\' 4"  (1.626 m)  Wt 193 lb 6.4 oz (87.726 kg)  BMI 33.18 kg/m2 Obese, NAD white woman Eyes anicteric Lungs are clear Abd soft, mildly tender epigastrium sounds are present and overall benign abdomen. She is alert and oriented 3 and has appropriate mood and affect  Data reviewed as per history of present illness     Assessment & Plan:   1. Functional dyspepsia   2. IBS (irritable bowel syndrome)    Reassured as best I could today. None of the studies have shown any problems that would be related to the cause of the pain and the characteristics and patterns are consistent with a functional dyspepsia and irritable bowel syndrome-type problem. I have prescribed duloxetine 30 mg to be taken 1 each day with supper for 2 weeks and then to each day and stay on 60 mg daily if tolerated. It does not appear to have any significant interactions with the medication she is on but since she is on psychotropic medications advised her to discuss this with her psychiatrist to be sure he had no problems with that. I explained that this medication helps treat anxiety and depression but we were prescribing it for her chronic pain problems. It will take 2-3 months to see if it works. She will return in about 3 months.  I appreciate the  opportunity to care for this patient.

## 2014-07-11 NOTE — Patient Instructions (Addendum)
Today we are providing you with printed rx's for duloxetine to take to the pharmacy after you check with your psychologist to make sure all your medicines agree.   Stop your glycopyrrolate per Dr. Carlean Purl.  Follow up with Dr Carlean Purl in 3 months.  Patient to call back and set up.    I appreciate the opportunity to care for you.

## 2014-07-17 ENCOUNTER — Telehealth: Payer: Self-pay | Admitting: Internal Medicine

## 2014-07-17 NOTE — Telephone Encounter (Signed)
Yes, that is fine. 

## 2014-07-17 NOTE — Telephone Encounter (Signed)
Letter is placed out front for the patient to come pick up.  I left her a message to pick up at her convenience

## 2014-07-17 NOTE — Telephone Encounter (Signed)
Patient was given a note to be out of work 06/25/14 and return to work on 06/26/14.  She missed half a day on 06/26/14 and is asking for a work note to go to work at 1:00.  Sarah Salazar, you provided the first notes. Ok to send another note for 1/2 day 06/26/14

## 2014-07-19 ENCOUNTER — Encounter: Payer: Self-pay | Admitting: Family Medicine

## 2014-07-19 ENCOUNTER — Ambulatory Visit (INDEPENDENT_AMBULATORY_CARE_PROVIDER_SITE_OTHER): Payer: 59 | Admitting: Family Medicine

## 2014-07-19 VITALS — BP 122/78 | HR 115 | Temp 99.1°F | Wt 187.4 lb

## 2014-07-19 DIAGNOSIS — J208 Acute bronchitis due to other specified organisms: Secondary | ICD-10-CM

## 2014-07-19 MED ORDER — AZITHROMYCIN 250 MG PO TABS: ORAL_TABLET | ORAL | Status: DC

## 2014-07-19 MED ORDER — PREDNISONE 10 MG PO TABS: ORAL_TABLET | ORAL | Status: DC

## 2014-07-19 MED ORDER — FLUTICASONE PROPIONATE 50 MCG/ACT NA SUSP: 2.0000 | Freq: Every day | NASAL | Status: DC

## 2014-07-19 NOTE — Patient Instructions (Signed)

## 2014-07-19 NOTE — Progress Notes (Signed)
Pre visit review using our clinic review tool, if applicable. No additional management support is needed unless otherwise documented below in the visit note. 

## 2014-07-19 NOTE — Progress Notes (Signed)
Subjective:    Patient ID: Sarah Salazar, female    DOB: 1956/04/18, 59 y.o.   MRN: [id number]  HPI  Patient here c/o wheezing and congestion x few days.  No fever.  Taking dayquil , claritin and robitussin with little relief.    Past Medical History  Diagnosis Date  . IBS (irritable bowel syndrome)   . Anxiety   . Depression   . Hypothyroidism     hypothyroidism  . Chest pain     Emergency room April 23, 2012, troponin is normal, chest CT scan showed no pulmonary embolus  . Cough     Chronic cough  . Ejection fraction     EF 55-60%, echo, December, 2013, lipomatous hypertrophy of the atrial septum  . GERD (gastroesophageal reflux disease)   . Personal history of failed moderate sedation 06/20/2012  . Personal history of colonic adenoma 06/28/2012  . Complication of anesthesia     "I'm allergic to versed and fentanyl"  . Family history of anesthesia complication     "hard time waking daughter up post SVT ablation"  . Pneumonia     "couple times" (08/01/2013)  . H/O hiatal hernia   . Migraine     "once or twice" (08/01/2013)  . Bulging lumbar disc     "L3-5" (08/01/2013)  . Bipolar disorder     Review of Systems  Constitutional: Positive for chills. Negative for fever.  HENT: Positive for congestion, postnasal drip, rhinorrhea and sinus pressure.   Respiratory: Positive for cough, chest tightness, shortness of breath and wheezing.   Cardiovascular: Negative for chest pain, palpitations and leg swelling.  Allergic/Immunologic: Negative for environmental allergies.    Current Outpatient Prescriptions on File Prior to Visit  Medication Sig Dispense Refill  . buPROPion (WELLBUTRIN) 100 MG tablet Take 100 mg by mouth at bedtime.    . DULoxetine (CYMBALTA) 30 MG capsule Take 2 capsules (60 mg total) by mouth daily with supper. Take 1 capsule daily for first 2 weeks 60 capsule 0  . [START ON 08/12/2014] DULoxetine (CYMBALTA) 60 MG capsule Take 1 capsule (60 mg total) by mouth  daily with supper. 30 capsule 2  . hyoscyamine (LEVSIN SL) 0.125 MG SL tablet Place 1 tablet (0.125 mg total) under the tongue every 4 (four) hours as needed. 30 tablet 0  . lamoTRIgine (LAMICTAL) 150 MG tablet Take 150 mg by mouth at bedtime.    Marland Kitchen levothyroxine (SYNTHROID, LEVOTHROID) 100 MCG tablet Take 1 tablet (100 mcg total) by mouth daily. 90 tablet 1  . metoprolol tartrate (LOPRESSOR) 25 MG tablet Take 0.5 tablets (12.5 mg total) by mouth 2 (two) times daily. 60 tablet 2  . pantoprazole (PROTONIX) 40 MG tablet Take 1 tablet (40 mg total) by mouth daily. 30 tablet 3  . promethazine (PHENERGAN) 25 MG tablet Take 1 tablet (25 mg total) by mouth every 6 (six) hours as needed for nausea or vomiting. 30 tablet 0   No current facility-administered medications on file prior to visit.       Objective:    Physical Exam  Constitutional: She is oriented to person, place, and time. She appears well-developed and well-nourished.  HENT:  Right Ear: External ear normal. Tympanic membrane is scarred.  Left Ear: External ear normal. Tympanic membrane is scarred.  + PND + errythema  Eyes: Conjunctivae are normal. Right eye exhibits no discharge. Left eye exhibits no discharge.  Cardiovascular: Normal rate, regular rhythm and normal heart sounds.   No murmur heard.  Pulmonary/Chest: Effort normal. No respiratory distress. She has wheezes. She has no rales. She exhibits no tenderness.  Musculoskeletal: She exhibits no edema.  Lymphadenopathy:    She has cervical adenopathy.  Neurological: She is alert and oriented to person, place, and time.    BP 122/78 mmHg  Pulse 115  Temp(Src) 99.1 F (37.3 C) (Oral)  Wt 187 lb 6.4 oz (85.004 kg)  SpO2 97% Wt Readings from Last 3 Encounters:  07/19/14 187 lb 6.4 oz (85.004 kg)  07/11/14 193 lb 6.4 oz (87.726 kg)  06/25/14 190 lb 9.6 oz (86.456 kg)     Lab Results  Component Value Date   WBC 9.4 06/05/2014   HGB 13.4 06/05/2014   HCT 39.3  06/05/2014   PLT 174.0 06/05/2014   GLUCOSE 90 06/05/2014   CHOL 165 08/02/2013   TRIG 143 08/02/2013   HDL 38* 08/02/2013   LDLDIRECT 126.5 10/12/2006   LDLCALC 98 08/02/2013   ALT 23 06/05/2014   AST 20 06/05/2014   NA 140 06/05/2014   K 3.6 06/05/2014   CL 106 06/05/2014   CREATININE 0.97 06/05/2014   BUN 15 06/05/2014   CO2 30 06/05/2014   TSH 1.770 08/01/2013   INR 1.05 08/01/2013   HGBA1C 5.6 08/01/2013       Assessment & Plan:   Problem List Items Addressed This Visit    None    Visit Diagnoses    Acute bronchitis due to other specified organisms    -  Primary    Relevant Medications    fluticasone (FLONASE) 50 MCG nasal spray    predniSONE (DELTASONE) tablet    azithromycin (ZITHROMAX) tablet       I am having Ms. Gimpel start on fluticasone. I am also having her maintain her lamoTRIgine, buPROPion, levothyroxine, metoprolol tartrate, promethazine, hyoscyamine, pantoprazole, DULoxetine, DULoxetine, predniSONE, and azithromycin.  Meds ordered this encounter  Medications  . DISCONTD: azithromycin (ZITHROMAX Z-PAK) 250 MG tablet    Sig: As directed    Dispense:  6 each    Refill:  0  . DISCONTD: predniSONE (DELTASONE) 10 MG tablet    Sig: 3 po qd for 3 days then 2 po qd for 3 days the 1 po qd for 3 days    Dispense:  18 tablet    Refill:  0  . fluticasone (FLONASE) 50 MCG/ACT nasal spray    Sig: Place 2 sprays into both nostrils daily.    Dispense:  16 g    Refill:  6  . predniSONE (DELTASONE) 10 MG tablet    Sig: 3 po qd for 3 days then 2 po qd for 3 days the 1 po qd for 3 days    Dispense:  18 tablet    Refill:  0  . azithromycin (ZITHROMAX Z-PAK) 250 MG tablet    Sig: As directed    Dispense:  6 each    Refill:  0     Garnet Koyanagi, DO

## 2014-07-22 ENCOUNTER — Telehealth: Payer: Self-pay | Admitting: Family Medicine

## 2014-07-22 NOTE — Telephone Encounter (Signed)
Spoke with patient and she said she is getting some better but the cough is getting worst. I advised her to try the Delsym. 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       KP

## 2014-07-22 NOTE — Telephone Encounter (Signed)
To MD to advise.      KP 

## 2014-07-22 NOTE — Telephone Encounter (Signed)
Try delsym it last 12 h If she feels she is getting no better --- ov

## 2014-07-22 NOTE — Telephone Encounter (Signed)
Ok to give note for 2 days

## 2014-07-22 NOTE — Telephone Encounter (Signed)
Caller name: Randa, Riss Relation to pt: self  Call back number: [phone] Pharmacy: [facility] [address], [place] - [address]  Reason for call:  Pt states she was taking robitussin every 4hrs and she would wake up coughing, throat irratation. Pt requesting another Rx and clinical advice. Pt was last seen 07/19/14.

## 2014-07-23 ENCOUNTER — Ambulatory Visit (INDEPENDENT_AMBULATORY_CARE_PROVIDER_SITE_OTHER): Payer: 59 | Admitting: Physician Assistant

## 2014-07-23 ENCOUNTER — Encounter: Payer: Self-pay | Admitting: Physician Assistant

## 2014-07-23 ENCOUNTER — Encounter: Payer: Self-pay | Admitting: Adult Health

## 2014-07-23 VITALS — BP 154/87 | HR 78 | Temp 98.1°F | Resp 16 | Ht 64.0 in | Wt 189.5 lb

## 2014-07-23 DIAGNOSIS — R059 Cough, unspecified: Secondary | ICD-10-CM

## 2014-07-23 DIAGNOSIS — R05 Cough: Secondary | ICD-10-CM

## 2014-07-23 DIAGNOSIS — R490 Dysphonia: Secondary | ICD-10-CM

## 2014-07-23 MED ORDER — BENZONATATE 100 MG PO CAPS
100.0000 mg | ORAL_CAPSULE | Freq: Three times a day (TID) | ORAL | Status: DC | PRN
Start: 2014-07-23 — End: 2014-08-29

## 2014-07-23 NOTE — Patient Instructions (Addendum)
-   Start taking 1-2 tessalon pearls three times a day as needed for relief from cough.  - REST your voice and drink plenty of tea and honey. - Finish your antibiotics that you were prescribed on Friday  - Follow up if you are not feeling better by Friday.  - if you develop a rash, please stop medication immediately and go to the ER.     Laryngitis At the top of your windpipe is your voice box. It is the source of your voice. Inside your voice box are 2 bands of muscles called vocal cords. When you breathe, your vocal cords are relaxed and open so that air can get into the lungs. When you decide to say something, these cords come together and vibrate. The sound from these vibrations goes into your throat and comes out through your mouth as sound. Laryngitis is an inflammation of the vocal cords that causes hoarseness, cough, loss of voice, sore throat, and dry throat. Laryngitis can be temporary (acute) or long-term (chronic). Most cases of acute laryngitis improve with time.Chronic laryngitis lasts for more than 3 weeks. CAUSES Laryngitis can often be related to excessive smoking, talking, or yelling, as well as inhalation of toxic fumes and allergies. Acute laryngitis is usually caused by a viral infection, vocal strain, measles or mumps, or bacterial infections. Chronic laryngitis is usually caused by vocal cord strain, vocal cord injury, postnasal drip, growths on the vocal cords, or acid reflux. SYMPTOMS   Cough.  Sore throat.  Dry throat. RISK FACTORS  Respiratory infections.  Exposure to irritating substances, such as cigarette smoke, excessive amounts of alcohol, stomach acids, and workplace chemicals.  Voice trauma, such as vocal cord injury from shouting or speaking too loud. DIAGNOSIS  Your cargiver will perform a physical exam. During the physical exam, your caregiver will examine your throat. The most common sign of laryngitis is hoarseness. Laryngoscopy may be necessary to  confirm the diagnosis of this condition. This procedure allows your caregiver to look into the larynx. HOME CARE INSTRUCTIONS  Drink enough fluids to keep your urine clear or pale yellow.  Rest until you no longer have symptoms or as directed by your caregiver.  Breathe in moist air.  Take all medicine as directed by your caregiver.  Do not smoke.  Talk as little as possible (this includes whispering).  Write on paper instead of talking until your voice is back to normal.  Follow up with your caregiver if your condition has not improved after 10 days. SEEK MEDICAL CARE IF:   You have trouble breathing.  You cough up blood.  You have persistent fever.  You have increasing pain.  You have difficulty swallowing. MAKE SURE YOU:  Understand these instructions.  Will watch your condition.  Will get help right away if you are not doing well or get worse. Document Released: 04/19/2005 Document Revised: 07/12/2011 Document Reviewed: 06/25/2010 [facility] Patient Information 2015 [place], [place]. This information is not intended to replace advice given to you by your health care provider. Make sure you discuss any questions you have with your health care provider.

## 2014-07-23 NOTE — Telephone Encounter (Signed)
Please advise      KP 

## 2014-07-23 NOTE — Telephone Encounter (Signed)
Patient scheduled with Einar Pheasant at 4:15.     KP

## 2014-07-23 NOTE — Progress Notes (Signed)
   Subjective:    Patient ID: Sarah Salazar, female    DOB: 02/24/1956, 59 y.o.   MRN: [id number]  HPI Patient presents to the office for follow up after visit Friday for Bronchitis in which she was prescribed a Z -pack and prednisone. She states that she feels better since Friday but that she continues to have a cough and now her voice is hoarse. She has been taking recommended Delsym and Robitussan with minimal results. She states " The Delysm only lasts three hours."   She also complains of right ear discomfort. Pain is intermittent and nothing helps with the pain.    Review of Systems  Constitutional: Positive for fatigue. Negative for fever, activity change and appetite change.  HENT: Positive for congestion, ear pain, sore throat and voice change.   Respiratory: Positive for cough. Negative for chest tightness, shortness of breath and wheezing.   Musculoskeletal: Negative for neck pain and neck stiffness.  Neurological: Negative for light-headedness and headaches.       Objective:   Physical Exam  Constitutional: She appears well-developed and well-nourished.  HENT:  Left Ear: External ear normal.  Slight AOM with fluid collection.  Non productive cough Hoarseness of voice  Eyes: Right eye exhibits no discharge. Left eye exhibits no discharge.  Cardiovascular: Normal rate and normal heart sounds.   Pulmonary/Chest: No respiratory distress. She has no wheezes.  Lymphadenopathy:    She has no cervical adenopathy.       Assessment & Plan:  Cough/Bronchitis - Spoke to patient about her allergy to Tessalon pearls. She informed me that she had taken Amoxicillin and Tessalon pearls together, one of the medications caused a rash. Patient was informed that although it was not impossible that the Tessalon pearls caused a rash, it was more likely that the rash was caused by the antibiotic.Since the suspicion for allergic reaction related to the Tessalon pearls is low, and the allergic  response is minimal, that we would prescribe Tessalon pearls for cough suppression. - She was told to stop medication immediately if she noticed a rash/trouble breathing/ throat tightness.  - She can take up to 2 pearls three times a day - Continue full course of ABX she was prescribed on Friday.  - Follow up in three days if not improved.   Laryngitis - Advised to rest her voice for two days - Get rest and drink tea with honey - If not improved in three days, follow up.   AOM - Continue antibiotics she was prescribed on Friday - Follow up if not improved in three days.

## 2014-07-23 NOTE — Telephone Encounter (Signed)
Pt called back, states the delsym is not working but for 4 hrs, pt does not feel anybetter and has no voice, pt want to know if she can be seen today, because she doesn't want to miss any more work. Would like for you to give her a call .

## 2014-07-23 NOTE — Telephone Encounter (Signed)
Ok to give a note for 2 days --- if no better at all call back she may need ov

## 2014-07-23 NOTE — Telephone Encounter (Signed)
Patient states that she did not go to work today and is requesting the note to include today, 07/23/14 as well.

## 2014-07-23 NOTE — Progress Notes (Signed)
Pre visit review using our clinic review tool, if applicable. No additional management support is needed unless otherwise documented below in the visit note/SLS  

## 2014-07-29 ENCOUNTER — Other Ambulatory Visit: Payer: Self-pay

## 2014-07-29 DIAGNOSIS — E039 Hypothyroidism, unspecified: Secondary | ICD-10-CM

## 2014-07-29 MED ORDER — LEVOTHYROXINE SODIUM 100 MCG PO TABS: 100.0000 ug | ORAL_TABLET | Freq: Every day | ORAL | Status: DC

## 2014-08-06 ENCOUNTER — Ambulatory Visit: Payer: Self-pay | Admitting: Internal Medicine

## 2014-08-29 ENCOUNTER — Ambulatory Visit (INDEPENDENT_AMBULATORY_CARE_PROVIDER_SITE_OTHER): Payer: 59 | Admitting: Family Medicine

## 2014-08-29 ENCOUNTER — Encounter: Payer: Self-pay | Admitting: Family Medicine

## 2014-08-29 VITALS — BP 122/82 | HR 78 | Temp 99.2°F | Wt 191.6 lb

## 2014-08-29 DIAGNOSIS — J209 Acute bronchitis, unspecified: Secondary | ICD-10-CM | POA: Diagnosis not present

## 2014-08-29 DIAGNOSIS — R05 Cough: Secondary | ICD-10-CM | POA: Diagnosis not present

## 2014-08-29 DIAGNOSIS — R059 Cough, unspecified: Secondary | ICD-10-CM

## 2014-08-29 MED ORDER — AZITHROMYCIN 250 MG PO TABS
ORAL_TABLET | ORAL | Status: DC
Start: 2014-08-29 — End: 2014-10-09

## 2014-08-29 MED ORDER — BENZONATATE 100 MG PO CAPS: 100.0000 mg | ORAL_CAPSULE | Freq: Two times a day (BID) | ORAL | Status: DC | PRN

## 2014-08-29 MED ORDER — ALBUTEROL SULFATE HFA 108 (90 BASE) MCG/ACT IN AERS: 2.0000 | INHALATION_SPRAY | Freq: Four times a day (QID) | RESPIRATORY_TRACT | Status: DC | PRN

## 2014-08-29 MED ORDER — BECLOMETHASONE DIPROPIONATE 40 MCG/ACT IN AERS: 2.0000 | INHALATION_SPRAY | Freq: Two times a day (BID) | RESPIRATORY_TRACT | Status: DC

## 2014-08-29 NOTE — Progress Notes (Signed)
Subjective:    Patient ID: Sarah Salazar, female    DOB: 1955/05/09, 59 y.o.   MRN: [id number]  HPI  Patient here for cough and congestion x 1 week.  Pt using otc with no relief.    Past Medical History  Diagnosis Date  . IBS (irritable bowel syndrome)   . Anxiety   . Depression   . Hypothyroidism     hypothyroidism  . Chest pain     Emergency room April 23, 2012, troponin is normal, chest CT scan showed no pulmonary embolus  . Cough     Chronic cough  . Ejection fraction     EF 55-60%, echo, December, 2013, lipomatous hypertrophy of the atrial septum  . GERD (gastroesophageal reflux disease)   . Personal history of failed moderate sedation 06/20/2012  . Personal history of colonic adenoma 06/28/2012  . Complication of anesthesia     "I'm allergic to versed and fentanyl"  . Family history of anesthesia complication     "hard time waking daughter up post SVT ablation"  . Pneumonia     "couple times" (08/01/2013)  . H/O hiatal hernia   . Migraine     "once or twice" (08/01/2013)  . Bulging lumbar disc     "L3-5" (08/01/2013)  . Bipolar disorder     Review of Systems  Constitutional: Positive for chills. Negative for fever.  HENT: Positive for congestion, postnasal drip, rhinorrhea and sinus pressure.   Respiratory: Positive for cough, chest tightness, shortness of breath and wheezing.   Cardiovascular: Negative for chest pain, palpitations and leg swelling.  Allergic/Immunologic: Negative for environmental allergies.    Current Outpatient Prescriptions on File Prior to Visit  Medication Sig Dispense Refill  . buPROPion (WELLBUTRIN) 100 MG tablet Take 100 mg by mouth at bedtime.    . fluticasone (FLONASE) 50 MCG/ACT nasal spray Place 2 sprays into both nostrils daily. 16 g 6  . hyoscyamine (LEVSIN SL) 0.125 MG SL tablet Place 1 tablet (0.125 mg total) under the tongue every 4 (four) hours as needed. 30 tablet 0  . lamoTRIgine (LAMICTAL) 150 MG tablet Take 150 mg by mouth  at bedtime.    Marland Kitchen levothyroxine (SYNTHROID, LEVOTHROID) 100 MCG tablet Take 1 tablet (100 mcg total) by mouth daily. Repeat labs are due now 90 tablet 0  . metoprolol tartrate (LOPRESSOR) 25 MG tablet Take 0.5 tablets (12.5 mg total) by mouth 2 (two) times daily. 60 tablet 2  . pantoprazole (PROTONIX) 40 MG tablet Take 1 tablet (40 mg total) by mouth daily. 30 tablet 3  . promethazine (PHENERGAN) 25 MG tablet Take 1 tablet (25 mg total) by mouth every 6 (six) hours as needed for nausea or vomiting. 30 tablet 0   No current facility-administered medications on file prior to visit.       Objective:    Physical Exam  Constitutional: She is oriented to person, place, and time. She appears well-developed and well-nourished.  HENT:  Right Ear: External ear normal.  Left Ear: External ear normal.  + PND + errythema  Eyes: Conjunctivae are normal. Right eye exhibits no discharge. Left eye exhibits no discharge.  Cardiovascular: Normal rate, regular rhythm and normal heart sounds.   No murmur heard. Pulmonary/Chest: Effort normal. No respiratory distress. She has wheezes. She has no rales. She exhibits no tenderness.  Musculoskeletal: She exhibits no edema.  Lymphadenopathy:    She has cervical adenopathy.  Neurological: She is alert and oriented to person, place, and time.  BP 122/82 mmHg  Pulse 78  Temp(Src) 99.2 F (37.3 C) (Oral)  Wt 191 lb 9.6 oz (86.909 kg)  SpO2 97% Wt Readings from Last 3 Encounters:  08/29/14 191 lb 9.6 oz (86.909 kg)  07/23/14 189 lb 8 oz (85.957 kg)  07/19/14 187 lb 6.4 oz (85.004 kg)     Lab Results  Component Value Date   WBC 9.4 06/05/2014   HGB 13.4 06/05/2014   HCT 39.3 06/05/2014   PLT 174.0 06/05/2014   GLUCOSE 90 06/05/2014   CHOL 165 08/02/2013   TRIG 143 08/02/2013   HDL 38* 08/02/2013   LDLDIRECT 126.5 10/12/2006   LDLCALC 98 08/02/2013   ALT 23 06/05/2014   AST 20 06/05/2014   NA 140 06/05/2014   K 3.6 06/05/2014   CL 106  06/05/2014   CREATININE 0.97 06/05/2014   BUN 15 06/05/2014   CO2 30 06/05/2014   TSH 1.770 08/01/2013   INR 1.05 08/01/2013   HGBA1C 5.6 08/01/2013       Assessment & Plan:   Problem List Items Addressed This Visit    None    Visit Diagnoses    Cough    -  Primary    Relevant Medications    benzonatate (TESSALON) 100 MG capsule    Acute bronchitis, unspecified organism        Relevant Medications    azithromycin (ZITHROMAX Z-PAK) 250 MG tablet    albuterol (PROAIR HFA) 108 (90 BASE) MCG/ACT inhaler    beclomethasone (QVAR) 40 MCG/ACT inhaler       I have discontinued Sarah Salazar's DULoxetine, predniSONE, azithromycin, and benzonatate. I am also having her start on benzonatate, azithromycin, albuterol, and beclomethasone. Additionally, I am having her maintain her lamoTRIgine, buPROPion, metoprolol tartrate, promethazine, hyoscyamine, pantoprazole, fluticasone, and levothyroxine.  Meds ordered this encounter  Medications  . benzonatate (TESSALON) 100 MG capsule    Sig: Take 1 capsule (100 mg total) by mouth 2 (two) times daily as needed for cough.    Dispense:  20 capsule    Refill:  0  . azithromycin (ZITHROMAX Z-PAK) 250 MG tablet    Sig: As directed    Dispense:  6 each    Refill:  0  . albuterol (PROAIR HFA) 108 (90 BASE) MCG/ACT inhaler    Sig: Inhale 2 puffs into the lungs every 6 (six) hours as needed for wheezing or shortness of breath.    Dispense:  1 Inhaler    Refill:  2  . beclomethasone (QVAR) 40 MCG/ACT inhaler    Sig: Inhale 2 puffs into the lungs 2 (two) times daily.    Dispense:  1 Inhaler    Refill:  [place], DO

## 2014-08-29 NOTE — Patient Instructions (Signed)
How to Use an Inhaler Proper inhaler technique is very important. Good technique ensures that the medicine reaches the lungs. Poor technique results in depositing the medicine on the tongue and back of the throat rather than in the airways. If you do not use the inhaler with good technique, the medicine will not help you. STEPS TO FOLLOW IF USING AN INHALER WITHOUT AN EXTENSION TUBE 1. Remove the cap from the inhaler. 2. If you are using the inhaler for the first time, you will need to prime it. Shake the inhaler for 5 seconds and release four puffs into the air, away from your face. Ask your health care provider or pharmacist if you have questions about priming your inhaler. 3. Shake the inhaler for 5 seconds before each breath in (inhalation). 4. Position the inhaler so that the top of the canister faces up. 5. Put your index finger on the top of the medicine canister. Your thumb supports the bottom of the inhaler. 6. Open your mouth. 7. Either place the inhaler between your teeth and place your lips tightly around the mouthpiece, or hold the inhaler 1-2 inches away from your open mouth. If you are unsure of which technique to use, ask your health care provider. 8. Breathe out (exhale) normally and as completely as possible. 9. Press the canister down with your index finger to release the medicine. 10. At the same time as the canister is pressed, inhale deeply and slowly until your lungs are completely filled. This should take 4-6 seconds. Keep your tongue down. 11. Hold the medicine in your lungs for 5-10 seconds (10 seconds is best). This helps the medicine get into the small airways of your lungs. 12. Breathe out slowly, through pursed lips. Whistling is an example of pursed lips. 13. Wait at least 15-30 seconds between puffs. Continue with the above steps until you have taken the number of puffs your health care provider has ordered. Do not use the inhaler more than your health care provider  tells you. 14. Replace the cap on the inhaler. 15. Follow the directions from your health care provider or the inhaler insert for cleaning the inhaler. STEPS TO FOLLOW IF USING AN INHALER WITH AN EXTENSION (SPACER) 1. Remove the cap from the inhaler. 2. If you are using the inhaler for the first time, you will need to prime it. Shake the inhaler for 5 seconds and release four puffs into the air, away from your face. Ask your health care provider or pharmacist if you have questions about priming your inhaler. 3. Shake the inhaler for 5 seconds before each breath in (inhalation). 4. Place the open end of the spacer onto the mouthpiece of the inhaler. 5. Position the inhaler so that the top of the canister faces up and the spacer mouthpiece faces you. 6. Put your index finger on the top of the medicine canister. Your thumb supports the bottom of the inhaler and the spacer. 7. Breathe out (exhale) normally and as completely as possible. 8. Immediately after exhaling, place the spacer between your teeth and into your mouth. Close your lips tightly around the spacer. 9. Press the canister down with your index finger to release the medicine. 10. At the same time as the canister is pressed, inhale deeply and slowly until your lungs are completely filled. This should take 4-6 seconds. Keep your tongue down and out of the way. 11. Hold the medicine in your lungs for 5-10 seconds (10 seconds is best). This helps the   medicine get into the small airways of your lungs. Exhale. 12. Repeat inhaling deeply through the spacer mouthpiece. Again hold that breath for up to 10 seconds (10 seconds is best). Exhale slowly. If it is difficult to take this second deep breath through the spacer, breathe normally several times through the spacer. Remove the spacer from your mouth. 13. Wait at least 15-30 seconds between puffs. Continue with the above steps until you have taken the number of puffs your health care provider has  ordered. Do not use the inhaler more than your health care provider tells you. 14. Remove the spacer from the inhaler, and place the cap on the inhaler. 15. Follow the directions from your health care provider or the inhaler insert for cleaning the inhaler and spacer. If you are using different kinds of inhalers, use your quick relief medicine to open the airways 10-15 minutes before using a steroid if instructed to do so by your health care provider. If you are unsure which inhalers to use and the order of using them, ask your health care provider, nurse, or respiratory therapist. If you are using a steroid inhaler, always rinse your mouth with water after your last puff, then gargle and spit out the water. Do not swallow the water. AVOID:  Inhaling before or after starting the spray of medicine. It takes practice to coordinate your breathing with triggering the spray.  Inhaling through the nose (rather than the mouth) when triggering the spray. HOW TO DETERMINE IF YOUR INHALER IS FULL OR NEARLY EMPTY You cannot know when an inhaler is empty by shaking it. A few inhalers are now being made with dose counters. Ask your health care provider for a prescription that has a dose counter if you feel you need that extra help. If your inhaler does not have a counter, ask your health care provider to help you determine the date you need to refill your inhaler. Write the refill date on a calendar or your inhaler canister. Refill your inhaler 7-10 days before it runs out. Be sure to keep an adequate supply of medicine. This includes making sure it is not expired, and that you have a spare inhaler.  SEEK MEDICAL CARE IF:   Your symptoms are only partially relieved with your inhaler.  You are having trouble using your inhaler.  You have some increase in phlegm. SEEK IMMEDIATE MEDICAL CARE IF:   You feel little or no relief with your inhalers. You are still wheezing and are feeling shortness of breath or  tightness in your chest or both.  You have dizziness, headaches, or a fast heart rate.  You have chills, fever, or night sweats.  You have a noticeable increase in phlegm production, or there is blood in the phlegm. MAKE SURE YOU:   Understand these instructions.  Will watch your condition.  Will get help right away if you are not doing well or get worse. Document Released: 04/16/2000 Document Revised: 02/07/2013 Document Reviewed: 11/16/2012 ExitCare Patient Information 2015 [employer]. This information is not intended to replace advice given to you by your health care provider. Make sure you discuss any questions you have with your health care provider.  

## 2014-08-29 NOTE — Progress Notes (Signed)
Pre visit review using our clinic review tool, if applicable. No additional management support is needed unless otherwise documented below in the visit note. 

## 2014-09-02 ENCOUNTER — Encounter: Payer: Self-pay | Admitting: Family Medicine

## 2014-09-02 ENCOUNTER — Telehealth: Payer: Self-pay | Admitting: Family Medicine

## 2014-09-02 ENCOUNTER — Other Ambulatory Visit: Payer: Self-pay | Admitting: Family Medicine

## 2014-09-02 DIAGNOSIS — R059 Cough, unspecified: Secondary | ICD-10-CM

## 2014-09-02 DIAGNOSIS — R05 Cough: Secondary | ICD-10-CM

## 2014-09-02 MED ORDER — PROMETHAZINE-DM 6.25-15 MG/5ML PO SYRP: 5.0000 mL | ORAL_SOLUTION | Freq: Four times a day (QID) | ORAL | Status: DC | PRN

## 2014-09-02 MED ORDER — GUAIFENESIN-CODEINE 100-10 MG/5ML PO SYRP: 5.0000 mL | ORAL_SOLUTION | Freq: Four times a day (QID) | ORAL | Status: DC | PRN

## 2014-09-02 NOTE — Telephone Encounter (Signed)
Ok to give work note

## 2014-09-02 NOTE — Telephone Encounter (Signed)
Patient is still coughing frequently (coughs through the night and coughs to the point of nausea).  She completes the z-pack tomorrow, but her symptoms have not improved.  She has been taking Robitussin DM with some relief for a brief time.  Her cough is no longer productive.  She reports a fever yesterday (unknown temp) and no fevers so far today.  She is very hoarse on the phone.  She is requesting medication for cough be sent to pharmacy.  She is available to come in today as well if needed.  Please advise.   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

## 2014-09-02 NOTE — Telephone Encounter (Signed)
Please advise on work note.     KP

## 2014-09-02 NOTE — Telephone Encounter (Signed)
I sent phenergan/ dextra to pharmacy--- ok to give note

## 2014-09-02 NOTE — Telephone Encounter (Signed)
Pt called wanting to know why phenergan was called in to help her cough. Advised pt this is a combination cough medication with phenergan in it. Pt states pharmacy told her they could not fill it as they were showing that Rx had been filled somewhere else.  Pt states she spoke to someone earlier re: Rx originally being sent to [place] and pt could not drive out here to pick it up. Unable to cancel Rx with [facility] as they have already closed. Spoke with NP, Inda Castle, who recommended that pt try Cheratussin 72mL every 6 hours as needed for cough. #148mL x no refills. Spoke with pt who states that she has taken codeine in the past and tolerated it well. Rx called to [facility] and advised to cancel previous Rx for promethazine-dextromethorphan.  Will cancel promethazine-dextromethorphan rx at [facility] tomorrow.

## 2014-09-02 NOTE — Telephone Encounter (Signed)
Please Triage.     KP

## 2014-09-02 NOTE — Telephone Encounter (Signed)
Printed and the patient has been made aware.Marland Kitchen      KP

## 2014-09-02 NOTE — Telephone Encounter (Signed)
Relation to pt: self  Call back number: [phone]   Reason for call:   Pt states she is not feeling better from last visit 08/29/14. Pt is coughing.

## 2014-09-02 NOTE — Telephone Encounter (Signed)
Relation to pt: self  Call back number: [phone] Pharmacy: [facility] [phone]  Reason for call:  Pt requesting promethazine-dextromethorphan (PROMETHAZINE-DM) 6.25-15 MG/5ML syrup please send to  [facility] [place], [place] - [address] [phone] (Phone) [phone] (Fax)       Pt is also requesting a doctor note exusing her from work 09/02/14 and 09/03/14. Please advise

## 2014-09-02 NOTE — Addendum Note (Signed)
Addended by: Kelle Darting A on: 09/02/2014 07:10 PM   Modules accepted: Orders, Medications

## 2014-09-02 NOTE — Telephone Encounter (Signed)
Phenergan / dexa---syrup sent

## 2014-09-03 NOTE — Telephone Encounter (Signed)
Spoke with Lauralyn Primes at [facility] and cancelled prometh/dextro cough syrup from 09/02/14.

## 2014-09-04 ENCOUNTER — Ambulatory Visit (INDEPENDENT_AMBULATORY_CARE_PROVIDER_SITE_OTHER): Payer: 59 | Admitting: Medical

## 2014-09-04 ENCOUNTER — Ambulatory Visit (HOSPITAL_BASED_OUTPATIENT_CLINIC_OR_DEPARTMENT_OTHER)
Admission: RE | Admit: 2014-09-04 | Discharge: 2014-09-04 | Disposition: A | Discharge status: Home / Self Care | Payer: 59 | Source: Ambulatory Visit | Attending: Medical | Admitting: Medical

## 2014-09-04 ENCOUNTER — Encounter: Payer: Self-pay | Admitting: Medical

## 2014-09-04 VITALS — BP 157/93 | HR 89 | Temp 98.3°F | Ht 64.0 in | Wt 189.2 lb

## 2014-09-04 DIAGNOSIS — J383 Other diseases of vocal cords: Secondary | ICD-10-CM | POA: Insufficient documentation

## 2014-09-04 DIAGNOSIS — R509 Fever, unspecified: Secondary | ICD-10-CM | POA: Insufficient documentation

## 2014-09-04 DIAGNOSIS — R059 Cough, unspecified: Secondary | ICD-10-CM

## 2014-09-04 DIAGNOSIS — R062 Wheezing: Secondary | ICD-10-CM | POA: Diagnosis not present

## 2014-09-04 DIAGNOSIS — J209 Acute bronchitis, unspecified: Secondary | ICD-10-CM | POA: Diagnosis not present

## 2014-09-04 DIAGNOSIS — R05 Cough: Secondary | ICD-10-CM | POA: Diagnosis present

## 2014-09-04 DIAGNOSIS — J040 Acute laryngitis: Secondary | ICD-10-CM

## 2014-09-04 DIAGNOSIS — R490 Dysphonia: Secondary | ICD-10-CM | POA: Diagnosis not present

## 2014-09-04 MED ORDER — PREDNISONE 20 MG PO TABS: ORAL_TABLET | ORAL | Status: DC

## 2014-09-04 MED ORDER — DOXYCYCLINE HYCLATE 100 MG PO TABS: 100.0000 mg | ORAL_TABLET | Freq: Two times a day (BID) | ORAL | Status: DC

## 2014-09-04 NOTE — Progress Notes (Signed)
Subjective:    Patient ID: Sarah Salazar, female    DOB: November 05, 1955, 59 y.o.   MRN: [id number]  HPI   Pt in today states coughing since last Thursday. Pt tried benzonatate, robitussin dm and recently given zpack, 2 inhalers but cough persists. Sunday felt mild better. But Monday got worse again. Pt does not smoke. Pt states she has been using some codeine based cough syrup since monday.  Cough controlled for about 6 hours then will come back. Pt has hoarse voice. NO fever, no chills or sweats.   Pt has history of reactive airways. Some wheezing with this cough. Some exposure to pets of her daughter when visiting them recently.  Pt on last day of antibiotic today.   Review of Systems  Constitutional: Positive for fatigue. Negative for fever and chills.  HENT: Positive for congestion and postnasal drip. Negative for sneezing, sore throat, tinnitus and trouble swallowing.   Respiratory: Positive for cough and wheezing.   Cardiovascular: Negative for chest pain and palpitations.  Gastrointestinal: Negative for abdominal pain.  Genitourinary: Negative.   Musculoskeletal: Negative for back pain.  Neurological: Negative for dizziness, weakness and headaches.  Hematological: Negative for adenopathy. Does not bruise/bleed easily.  Psychiatric/Behavioral: Negative for confusion and dysphoric mood.   Past Medical History  Diagnosis Date  . IBS (irritable bowel syndrome)   . Anxiety   . Depression   . Hypothyroidism     hypothyroidism  . Chest pain     Emergency room April 23, 2012, troponin is normal, chest CT scan showed no pulmonary embolus  . Cough     Chronic cough  . Ejection fraction     EF 55-60%, echo, December, 2013, lipomatous hypertrophy of the atrial septum  . GERD (gastroesophageal reflux disease)   . Personal history of failed moderate sedation 06/20/2012  . Personal history of colonic adenoma 06/28/2012  . Complication of anesthesia     "I'm allergic to versed and  fentanyl"  . Family history of anesthesia complication     "hard time waking daughter up post SVT ablation"  . Pneumonia     "couple times" (08/01/2013)  . H/O hiatal hernia   . Migraine     "once or twice" (08/01/2013)  . Bulging lumbar disc     "L3-5" (08/01/2013)  . Bipolar disorder     History   Social History  . Marital Status: Divorced    Spouse Name: N/A  . Number of Children: 1  . Years of Education: N/A   Occupational History  .     Social History Main Topics  . Smoking status: Never Smoker   . Smokeless tobacco: Never Used  . Alcohol Use: Yes     Comment: 08/01/2013 "glass of wine 6 times/year at most"  . Drug Use: No  . Sexual Activity: Yes   Other Topics Concern  . 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 Cholecystectomy    . Dilation and curettage of uterus  X 3  . Knee arthroscopy Right X 2  . Wrist surgery  1979-~ 2010    X 4  . Laparoscopy  1970's - 06/03/1990    "several; to evaluate dysfunctional menses & pelvic pain"  . Cystocele repair  01/13/2009  .  Esophagogastroduodenoscopy (egd) with esophageal dilation  2004; 2014  . Colonoscopy  2002  . Elbow surgery Left 2005  . Breast lumpectomy Left     "1st breast OR"  . Mastectomy, partial Left     "2nd breast OR", benign fibrocystic changes  . Wisdom tooth extraction      "all 4 at once"    Family History  Problem Relation Age of Onset  . Coronary artery disease Mother   . Stroke Mother     TIA  . Uterine cancer Mother     BREAST  . Lung cancer Father   . Heart disease Father   . Colon cancer Paternal Uncle     COLON; STOMACH  . COPD Father   . Thyroid disease Mother     goiter  . Lung cancer Maternal Grandfather   . Coronary artery disease Father   . Rheum arthritis Maternal Aunt   . Supraventricular tachycardia Daughter      S/P ablation    Allergies  Allergen Reactions  . Amoxicillin Rash       . Fentanyl Rash    given with Versed  . Midazolam Rash    given with Fentanyl  . Oxycodone-Aspirin Rash  . Sulfonamide Derivatives Rash    rash  . Hydrocodone Hives and Nausea And Vomiting  . Nalbuphine Nausea And Vomiting  . Oxycodone-Acetaminophen Hives and Nausea And Vomiting  . Venlafaxine Other (See Comments)    Patient says makes her crazy  . Verapamil     Caused "pounding " in chest    Current Outpatient Prescriptions on File Prior to Visit  Medication Sig Dispense Refill  . albuterol (PROAIR HFA) 108 (90 BASE) MCG/ACT inhaler Inhale 2 puffs into the lungs every 6 (six) hours as needed for wheezing or shortness of breath. 1 Inhaler 2  . azithromycin (ZITHROMAX Z-PAK) 250 MG tablet As directed 6 each 0  . beclomethasone (QVAR) 40 MCG/ACT inhaler Inhale 2 puffs into the lungs 2 (two) times daily. 1 Inhaler 12  . buPROPion (WELLBUTRIN) 100 MG tablet Take 100 mg by mouth at bedtime.    . fluticasone (FLONASE) 50 MCG/ACT nasal spray Place 2 sprays into both nostrils daily. 16 g 6  . guaiFENesin-codeine (CHERATUSSIN AC) 100-10 MG/5ML syrup Take 5 mLs by mouth every 6 (six) hours as needed for cough. 100 mL 0  . hyoscyamine (LEVSIN SL) 0.125 MG SL tablet Place 1 tablet (0.125 mg total) under the tongue every 4 (four) hours as needed. 30 tablet 0  . lamoTRIgine (LAMICTAL) 150 MG tablet Take 150 mg by mouth at bedtime.    Marland Kitchen levothyroxine (SYNTHROID, LEVOTHROID) 100 MCG tablet Take 1 tablet (100 mcg total) by mouth daily. Repeat labs are due now 90 tablet 0  . metoprolol tartrate (LOPRESSOR) 25 MG tablet Take 0.5 tablets (12.5 mg total) by mouth 2 (two) times daily. 60 tablet 2  . pantoprazole (PROTONIX) 40 MG tablet Take 1 tablet (40 mg total) by mouth daily. 30 tablet 3  . promethazine (PHENERGAN) 25 MG tablet Take 1 tablet (25 mg total) by mouth every 6 (six) hours as needed for nausea or vomiting. 30  tablet 0  . benzonatate (TESSALON) 100 MG capsule Take 1 capsule (100 mg total) by mouth 2 (two) times daily as needed for cough. (Patient not taking: Reported on 09/04/2014) 20 capsule 0   No current facility-administered medications on file prior to visit.    BP 157/93 mmHg  Pulse 89  Temp(Src) 98.3 F (36.8  C) (Oral)  Ht 5\' 4"  (1.626 m)  Wt 189 lb 3.2 oz (85.821 kg)  BMI 32.46 kg/m2  SpO2 97%      Objective:   Physical Exam  General  Mental Status - Alert. General Appearance - Well groomed. Not in acute distress. intermirttent cough on exam  Skin Rashes- No Rashes.  HEENT Head- Normal. Ear Auditory Canal - Left- Normal. Right - Normal.Tympanic Membrane- Left- Normal. Right- Normal. Eye Sclera/Conjunctiva- Left- Normal. Right- Normal. Nose & Sinuses Nasal Mucosa- Left-  Not boggy or Congested. Right-  Not  boggy or Congested. No sinus pressure Mouth & Throat Lips: Upper Lip- Normal: no dryness, cracking, pallor, cyanosis, or vesicular eruption. Lower Lip-Normal: no dryness, cracking, pallor, cyanosis or vesicular eruption. Buccal Mucosa- Bilateral- No Aphthous ulcers. Oropharynx- No Discharge or Erythema. +PND Tonsils: Characteristics- Bilateral- No Erythema or Congestion. Size/Enlargement- Bilateral- No enlargement. Discharge- bilateral-None.  Neck Neck- Supple. No Masses.   Chest and Lung Exam Auscultation: Breath Sounds:- even and unlabored. Mild shallow.  Cardiovascular Auscultation:Rythm- Regular, rate and rhythm. Murmurs & Other Heart Sounds:Ausculatation of the heart reveal- No Murmurs.  Lymphatic Head & Neck General Head & Neck Lymphatics: Bilateral: Description- No Localized lymphadenopathy.       Assessment & Plan:

## 2014-09-04 NOTE — Assessment & Plan Note (Signed)
Since cough is persisting and she is on 5th day of zpack. Will get cxr. Consider other antibiotic if cxr + or if symptoms worsen.  May make doxycycline available as stated above.

## 2014-09-04 NOTE — Assessment & Plan Note (Signed)
Continue codeine cough syrup since this seems to have helped.

## 2014-09-04 NOTE — Assessment & Plan Note (Signed)
Use qvar and albuterol. Will add 3 days or oral prednisone.

## 2014-09-04 NOTE — Assessment & Plan Note (Signed)
Rest voice.

## 2014-09-04 NOTE — Patient Instructions (Signed)
Cough Continue codeine cough syrup since this seems to have helped.   Acute bronchitis Since cough is persisting and she is on 5th day of zpack. Will get cxr. Consider other antibiotic if cxr + or if symptoms worsen.  May make doxycycline available as stated above.   Wheezing Use qvar and albuterol. Will add 3 days or oral prednisone.   Laryngitis Rest voice.     Follow up in 7 days or as needed

## 2014-09-04 NOTE — Progress Notes (Signed)
Pre visit review using our clinic review tool, if applicable. No additional management support is needed unless otherwise documented below in the visit note. 

## 2014-10-09 ENCOUNTER — Encounter: Payer: Self-pay | Admitting: Medical

## 2014-10-09 ENCOUNTER — Telehealth: Payer: Self-pay | Admitting: Medical

## 2014-10-09 ENCOUNTER — Ambulatory Visit (INDEPENDENT_AMBULATORY_CARE_PROVIDER_SITE_OTHER): Payer: 59 | Admitting: Medical

## 2014-10-09 VITALS — BP 147/89 | HR 59 | Temp 98.7°F | Ht 64.0 in | Wt 190.4 lb

## 2014-10-09 DIAGNOSIS — G4733 Obstructive sleep apnea (adult) (pediatric): Secondary | ICD-10-CM | POA: Insufficient documentation

## 2014-10-09 DIAGNOSIS — R0683 Snoring: Secondary | ICD-10-CM

## 2014-10-09 DIAGNOSIS — R5383 Other fatigue: Secondary | ICD-10-CM

## 2014-10-09 LAB — CBC WITH DIFFERENTIAL/PLATELET
Basophils Absolute: 0 10*3/uL (ref 0.0–0.1)
Basophils Relative: 0.4 % (ref 0.0–3.0)
Eosinophils Absolute: 0.1 10*3/uL (ref 0.0–0.7)
Eosinophils Relative: 2.1 % (ref 0.0–5.0)
HCT: 41.7 % (ref 36.0–46.0)
Hemoglobin: 14 g/dL (ref 12.0–15.0)
Lymphocytes Relative: 26.7 % (ref 12.0–46.0)
Lymphs Abs: 1.8 10*3/uL (ref 0.7–4.0)
MCHC: 33.5 g/dL (ref 30.0–36.0)
MCV: 93.4 fl (ref 78.0–100.0)
Monocytes Absolute: 0.5 10*3/uL (ref 0.1–1.0)
Monocytes Relative: 7.3 % (ref 3.0–12.0)
Neutro Abs: 4.2 10*3/uL (ref 1.4–7.7)
Neutrophils Relative %: 63.5 % (ref 43.0–77.0)
Platelets: 199 10*3/uL (ref 150.0–400.0)
RBC: 4.47 Mil/uL (ref 3.87–5.11)
RDW: 13.9 % (ref 11.5–15.5)
WBC: 6.6 10*3/uL (ref 4.0–10.5)

## 2014-10-09 LAB — COMPREHENSIVE METABOLIC PANEL
ALT: 18 U/L (ref 0–35)
AST: 17 U/L (ref 0–37)
Albumin: 4.1 g/dL (ref 3.5–5.2)
Alkaline Phosphatase: 97 U/L (ref 39–117)
BUN: 10 mg/dL (ref 6–23)
CO2: 28 mEq/L (ref 19–32)
Calcium: 9.4 mg/dL (ref 8.4–10.5)
Chloride: 106 mEq/L (ref 96–112)
Creatinine, Ser: 0.94 mg/dL (ref 0.40–1.20)
GFR: 65.31 mL/min (ref >60.00)
Glucose, Bld: 82 mg/dL (ref 70–99)
Potassium: 3.7 mEq/L (ref 3.5–5.1)
Sodium: 138 mEq/L (ref 135–145)
Total Bilirubin: 0.4 mg/dL (ref 0.2–1.2)
Total Protein: 7.4 g/dL (ref 6.0–8.3)

## 2014-10-09 LAB — TSH: TSH: 0.46 u[IU]/mL (ref 0.35–4.50)

## 2014-10-09 NOTE — Assessment & Plan Note (Signed)
Tsh, cbc, cmp 

## 2014-10-09 NOTE — Progress Notes (Signed)
Pre visit review using our clinic review tool, if applicable. No additional management support is needed unless otherwise documented below in the visit note. 

## 2014-10-09 NOTE — Assessment & Plan Note (Addendum)
Hx of and appears to be severely interupting her sleep. Will refer to pulmonologist and get there opinion. Explain problems with prior sleep study.  Also some nasal congestion with deviated septum. I asked to try short trial of only afrin for 2 days. To see if this imporved quality of sleep. Notify me if slept better for those 2 days.

## 2014-10-09 NOTE — Telephone Encounter (Signed)
Note to referral staff.

## 2014-10-09 NOTE — Progress Notes (Signed)
Subjective:    Patient ID: Sarah Salazar, female    DOB: [birth year], 59 y.o.   MRN: [id number]  HPI  Pt in states that recently she is feeling smothered while while sleeping(Gradualy getting worse) Now daily. This has been going on and off for a while(years). Pt states years ago checked for sleep apnea. 5-6 years ago. But then she had to sleep on her back. But she never fell asleep deeply. She did not snore during exam. But her boyfriend states she does snore all the time. Told back then years ago she  did not have sleep apnea.   Stats after feeling smothered and waking has troulbe falling back asleep.  Pt has fatigue she thinks it is related to snoring or maybe her thyroid.    Review of Systems  Constitutional: Positive for fatigue. Negative for fever and chills.  Respiratory: Negative for cough, chest tightness, shortness of breath and wheezing.        Snoring and smothered at night.  Cardiovascular: Negative for chest pain and palpitations.  Gastrointestinal: Negative.   Musculoskeletal: Positive for back pain.  Neurological: Negative for dizziness, tremors, weakness, light-headedness and headaches.  Hematological: Negative for adenopathy. Does not bruise/bleed easily.  Psychiatric/Behavioral: Negative for behavioral problems and confusion.   Past Medical History  Diagnosis Date  . IBS (irritable bowel syndrome)   . Anxiety   . Depression   . Hypothyroidism     hypothyroidism  . Chest pain     Emergency room April 23, 2012, troponin is normal, chest CT scan showed no pulmonary embolus  . Cough     Chronic cough  . Ejection fraction     EF 55-60%, echo, December, 2013, lipomatous hypertrophy of the atrial septum  . GERD (gastroesophageal reflux disease)   . Personal history of failed moderate sedation 06/20/2012  . Personal history of colonic adenoma 06/28/2012  . Complication of anesthesia     "I'm allergic to versed and fentanyl"  . Family history of anesthesia  complication     "hard time waking daughter up post SVT ablation"  . Pneumonia     "couple times" (08/01/2013)  . H/O hiatal hernia   . Migraine     "once or twice" (08/01/2013)  . Bulging lumbar disc     "L3-5" (08/01/2013)  . Bipolar disorder     History   Social History  . Marital Status: Divorced    Spouse Name: N/A  . Number of Children: 1  . Years of Education: N/A   Occupational History  .     Social History Main Topics  . Smoking status: Never Smoker   . Smokeless tobacco: Never Used  . Alcohol Use: Yes     Comment: 08/01/2013 "glass of wine 6 times/year at most"  . Drug Use: No  . Sexual Activity: Yes   Other Topics Concern  . 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 Cholecystectomy    . Dilation and curettage of uterus  X 3  . Knee arthroscopy Right X 2  . Wrist surgery  1979-~ 2010    X 4  . Laparoscopy  1970's - 06/03/1990    "several; to evaluate dysfunctional menses & pelvic pain"  . Cystocele repair  01/13/2009  . Esophagogastroduodenoscopy (egd) with esophageal dilation  2004; 2014  . Colonoscopy  2002  . Elbow surgery Left 2005  . Breast lumpectomy Left     "1st breast OR"  . Mastectomy, partial Left     "2nd breast OR", benign fibrocystic changes  . Wisdom tooth extraction      "all 4 at once"    Family History  Problem Relation Age of Onset  . Coronary artery disease Mother   . Stroke Mother     TIA  . Uterine cancer Mother     BREAST  . Lung cancer Father   . Heart disease Father   . Colon cancer Paternal Uncle     COLON; STOMACH  . COPD Father   . Thyroid disease Mother     goiter  . Lung cancer Maternal Grandfather   . Coronary artery disease Father   . Rheum arthritis Maternal Aunt   . Supraventricular tachycardia Daughter     S/P ablation    Allergies    Allergen Reactions  . Amoxicillin Rash       . Fentanyl Rash    given with Versed  . Midazolam Rash    given with Fentanyl  . Oxycodone-Aspirin Rash  . Sulfonamide Derivatives Rash    rash  . Hydrocodone Hives and Nausea And Vomiting  . Nalbuphine Nausea And Vomiting  . Oxycodone-Acetaminophen Hives and Nausea And Vomiting  . Venlafaxine Other (See Comments)    Patient says makes her crazy  . Verapamil     Caused "pounding " in chest    Current Outpatient Prescriptions on File Prior to Visit  Medication Sig Dispense Refill  . beclomethasone (QVAR) 40 MCG/ACT inhaler Inhale 2 puffs into the lungs 2 (two) times daily. 1 Inhaler 12  . buPROPion (WELLBUTRIN) 100 MG tablet Take 100 mg by mouth at bedtime.    Marland Kitchen doxycycline (VIBRA-TABS) 100 MG tablet Take 1 tablet (100 mg total) by mouth 2 (two) times daily. 20 tablet 0  . fluticasone (FLONASE) 50 MCG/ACT nasal spray Place 2 sprays into both nostrils daily. 16 g 6  . hyoscyamine (LEVSIN SL) 0.125 MG SL tablet Place 1 tablet (0.125 mg total) under the tongue every 4 (four) hours as needed. 30 tablet 0  . lamoTRIgine (LAMICTAL) 150 MG tablet Take 150 mg by mouth at bedtime.    Marland Kitchen levothyroxine (SYNTHROID, LEVOTHROID) 100 MCG tablet Take 1 tablet (100 mcg total) by mouth daily. Repeat labs are due now 90 tablet 0  . metoprolol tartrate (LOPRESSOR) 25 MG tablet Take 0.5 tablets (12.5 mg total) by mouth 2 (two) times daily. 60 tablet 2  . pantoprazole (PROTONIX) 40 MG tablet Take 1 tablet (40 mg total) by mouth daily. 30 tablet 3  . albuterol (PROAIR HFA) 108 (90 BASE) MCG/ACT inhaler Inhale 2 puffs into the lungs every 6 (six) hours as needed for wheezing or shortness of breath. 1 Inhaler 2   No current facility-administered medications on file prior to visit.    BP 147/89 mmHg  Pulse 59  Temp(Src) 98.7 F (37.1 C) (Oral)  Ht 5\' 4"  (1.626 m)  Wt 190 lb 6.4 oz (86.365 kg)  BMI 32.67 kg/m2  SpO2 99%       Objective:   Physical  Exam  General  Mental Status - Alert. General Appearance - Well groomed. Not in acute distress.  Skin Rashes- No Rashes.  HEENT Head- Normal. Ear Auditory Canal - Left- Normal. Right - Normal.Tympanic Membrane- Left- Normal.  Right- Normal. Eye Sclera/Conjunctiva- Left- Normal. Right- Normal. Nose & Sinuses Nasal Mucosa- Left-  boggy + Congested. Right-  Boggy + Congested. Septum looks deviated. Mouth & Throat Lips: Upper Lip- Normal: no dryness, cracking, pallor, cyanosis, or vesicular eruption. Lower Lip-Normal: no dryness, cracking, pallor, cyanosis or vesicular eruption. Buccal Mucosa- Bilateral- No Aphthous ulcers. Oropharynx- No Discharge or Erythema. Tonsils: Characteristics- Bilateral- No Erythema or Congestion. Size/Enlargement- Bilateral- No enlargement. Discharge- bilateral-None.  Neck Neck- Supple. No Masses. No thyromegaly.  Chest and Lung Exam Auscultation: Breath Sounds:- even and unlabored.  Cardiovascular Auscultation:Rythm- Regular, rate and rhythm. Murmurs & Other Heart Sounds:Ausculatation of the heart reveal- No Murmurs.  Lymphatic Head & Neck General Head & Neck Lymphatics: Bilateral: Description- No Localized lymphadenopathy.       Assessment & Plan:

## 2014-10-09 NOTE — Patient Instructions (Addendum)
Snoring Hx of and appears to be severely interupting her sleep. Will refer to pulmonologist and get there opinion. Explain problems with prior sleep study.  Also some nasal congestion with deviated septum. I asked to try short trial of only afrin for 2 days. To see if this imporved quality of sleep. Notify me if slept better for those 2 days.  Fatigue Tsh, cbc, cmp.   Follow up in 3 wks or as needed

## 2014-10-11 ENCOUNTER — Other Ambulatory Visit: Payer: Self-pay

## 2014-10-11 DIAGNOSIS — R42 Dizziness and giddiness: Secondary | ICD-10-CM

## 2014-10-11 DIAGNOSIS — E039 Hypothyroidism, unspecified: Secondary | ICD-10-CM

## 2014-10-11 MED ORDER — LEVOTHYROXINE SODIUM 100 MCG PO TABS
100.0000 ug | ORAL_TABLET | Freq: Every day | ORAL | Status: DC
Start: 2014-10-11 — End: 2015-01-30

## 2014-10-11 MED ORDER — METOPROLOL TARTRATE 25 MG PO TABS: 12.5000 mg | ORAL_TABLET | Freq: Two times a day (BID) | ORAL | Status: DC

## 2014-10-14 ENCOUNTER — Telehealth: Payer: Self-pay | Admitting: Family Medicine

## 2014-10-14 NOTE — Telephone Encounter (Signed)
Caller name: Glora Hulgan Relationship to patient: self Can be reached: [phone] Pharmacy: [facility]  Reason for call: Pt states she was to use Afrin for 2 days. She did that and it did help. Should she continue use or is something else to be called in? Please notify pt.

## 2014-10-16 ENCOUNTER — Ambulatory Visit (INDEPENDENT_AMBULATORY_CARE_PROVIDER_SITE_OTHER): Payer: 59 | Admitting: Pulmonary Disease

## 2014-10-16 ENCOUNTER — Encounter: Payer: Self-pay | Admitting: Family Medicine

## 2014-10-16 ENCOUNTER — Other Ambulatory Visit: Payer: 59

## 2014-10-16 ENCOUNTER — Encounter: Payer: Self-pay | Admitting: Pulmonary Disease

## 2014-10-16 VITALS — BP 138/94 | HR 88 | Ht 65.0 in | Wt 190.6 lb

## 2014-10-16 DIAGNOSIS — R05 Cough: Secondary | ICD-10-CM

## 2014-10-16 DIAGNOSIS — R0602 Shortness of breath: Secondary | ICD-10-CM

## 2014-10-16 DIAGNOSIS — K219 Gastro-esophageal reflux disease without esophagitis: Secondary | ICD-10-CM

## 2014-10-16 DIAGNOSIS — R053 Chronic cough: Secondary | ICD-10-CM

## 2014-10-16 DIAGNOSIS — G4733 Obstructive sleep apnea (adult) (pediatric): Secondary | ICD-10-CM

## 2014-10-16 NOTE — Progress Notes (Signed)
Subjective:    Patient ID: Sarah Salazar, female    DOB: 1955/08/27, 59 y.o.   MRN: [id number]  HPI 59 year old never smoker with bipolar disorder referred for evaluation of sleep-disordered breathing and chronic cough. 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 She was treated aggressively for reflux, as well as behavioral therapies for cyclical coughing. She has occasional breakthrough with strong odors. She reports 2 bouts in the spring with hoarseness of voice, was placed on Qvar and albuterol with some relief. Chest x-ray 09/2014 did not show any infiltrates. She wonders if she should be checked for allergies Spirometry today shows no evidence of airway obstruction with a ratio 82, FEV1 87% and FVC 84%. She reports esophageal dilatation procedure in 2013.  Epworth sleepiness score is 6. Loud snoring has been noted by her boyfriend, she reports excessive daytime tiredness and has woken up gasping for breath. PSG in 10/2006 was reported negative-but she feels that she never went into deep sleep during that study. Bedtime is around 11 PM, sleep latency about an hour, she sleeps on her side with 2 pillows, reports 2-3 nocturnal awakenings with nocturia, boyfriend wakes her up frequently for about snoring, is out of bed by 8:15 AM feeling tired with dryness of mouth and occasional headache. She's gained 40 pounds since 2008. There is no history suggestive of cataplexy, sleep paralysis or parasomnias   Past Medical History  Diagnosis Date  . IBS (irritable bowel syndrome)   . Anxiety   . Depression   . Hypothyroidism     hypothyroidism  . Chest pain     Emergency room April 23, 2012, troponin is normal, chest CT scan showed no pulmonary embolus  . Cough     Chronic cough  . Ejection fraction     EF 55-60%, echo, December, 2013, lipomatous hypertrophy of the atrial septum    . GERD (gastroesophageal reflux disease)   . Personal history of failed moderate sedation 06/20/2012  . Personal history of colonic adenoma 06/28/2012  . Complication of anesthesia     "I'm allergic to versed and fentanyl"  . Family history of anesthesia complication     "hard time waking daughter up post SVT ablation"  . Pneumonia     "couple times" (08/01/2013)  . H/O hiatal hernia   . Migraine     "once or twice" (08/01/2013)  . Bulging lumbar disc     "L3-5" (08/01/2013)  . Bipolar disorder    Past Surgical History  Procedure Laterality Date  . Cholecystectomy    . Dilation and curettage of uterus  X 3  . Knee arthroscopy Right X 2  . Wrist surgery  1979-~ 2010    X 4  . Laparoscopy  1970's - 06/03/1990    "several; to evaluate dysfunctional menses & pelvic pain"  . Cystocele repair  01/13/2009  . Esophagogastroduodenoscopy (egd) with esophageal dilation  2004; 2014  . Colonoscopy  2002  . Elbow surgery Left 2005  . Breast lumpectomy Left     "1st breast OR"  . Mastectomy, partial Left     "2nd breast OR", benign fibrocystic changes  . Wisdom tooth extraction      "all 4 at once"   Allergies  Allergen Reactions  . Amoxicillin Rash       . Fentanyl Rash    given with Versed  . Midazolam Rash  given with Fentanyl  . Oxycodone-Aspirin Rash  . Sulfonamide Derivatives Rash    rash  . Hydrocodone Hives and Nausea And Vomiting  . Nalbuphine Nausea And Vomiting  . Oxycodone-Acetaminophen Hives and Nausea And Vomiting  . Venlafaxine Other (See Comments)    Patient says makes her crazy  . Verapamil     Caused "pounding " in chest    History   Social History  . Marital Status: Divorced    Spouse Name: N/A  . Number of Children: 1  . Years of Education: N/A   Occupational History  .     Social History Main Topics  . Smoking status: Never Smoker   . Smokeless tobacco: Never Used  . Alcohol Use: Yes     Comment: 08/01/2013 "glass of wine 6 times/year at most"  .  Drug Use: No  . Sexual Activity: Yes   Other Topics Concern  . 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  HENT: Negative for congestion, dental problem, ear pain, nosebleeds, postnasal drip, rhinorrhea, sinus pressure, sneezing, sore throat, trouble swallowing and voice change.   Eyes: Negative for visual disturbance.  Respiratory: Negative for cough, choking and shortness of breath.   Cardiovascular: Negative for chest pain and leg swelling.  Gastrointestinal: Negative for vomiting, abdominal pain and diarrhea.  Genitourinary: Negative for difficulty urinating.  Musculoskeletal: Negative for arthralgias.  Skin: Negative for rash.  Neurological: Negative for tremors, syncope and headaches.  Hematological: Does not bruise/bleed easily.       Objective:   Physical Exam  Gen. Pleasant, obese, in no distress, normal affect ENT - no lesions, no post nasal drip, class 2-3 airway Neck: No JVD, no thyromegaly, no carotid bruits Lungs: no use of accessory muscles, no dullness to percussion, decreased without rales or rhonchi  Cardiovascular: Rhythm regular, heart sounds  normal, no murmurs or gallops, no peripheral edema Abdomen: soft and non-tender, no hepatosplenomegaly, BS normal. Musculoskeletal: No deformities, no cyanosis or clubbing Neuro:  alert, non focal, no tremors       Assessment & Plan:

## 2014-10-16 NOTE — Assessment & Plan Note (Signed)
Combination of upper airway + GERD Spirometry  Blood test for allergies

## 2014-10-16 NOTE — Telephone Encounter (Signed)
Called patient advised ES would like to get results of Sleep Study to determine next treatment.

## 2014-10-16 NOTE — Assessment & Plan Note (Addendum)
Given excessive daytime somnolence, narrow pharyngeal exam, witnessed apneas & loud snoring, obstructive sleep apnea is very likely & an overnight polysomnogram will be scheduled as a home study. The pathophysiology of obstructive sleep apnea , it's cardiovascular consequences & modes of treatment including CPAP were discused with the patient in detail & they evidenced understanding. Pretest probability is high-and is very likely that given her weight gain since her last study 8 years ago she has developed significant sleep disordered breathing

## 2014-10-16 NOTE — Patient Instructions (Signed)
Home sleep study Breathing test Blood test for allergies

## 2014-10-17 ENCOUNTER — Telehealth: Payer: Self-pay | Admitting: Family Medicine

## 2014-10-17 LAB — ALLERGY FULL PROFILE
Allergen, D pternoyssinus,d7: <0.1 kU/L
Allergen,Goose feathers, e70: <0.1 kU/L
Alternaria Alternata: <0.1 kU/L
Aspergillus fumigatus, m3: <0.1 kU/L
Bahia Grass: <0.1 kU/L
Bermuda Grass: <0.1 kU/L
Box Elder IgE: <0.1 kU/L
Candida Albicans: <0.1 kU/L
Cat Dander: <0.1 kU/L
Common Ragweed: <0.1 kU/L
Curvularia lunata: <0.1 kU/L
D. farinae: <0.1 kU/L
Dog Dander: <0.1 kU/L
Elm IgE: <0.1 kU/L
Fescue: <0.1 kU/L
G005 Rye, Perennial: <0.1 kU/L
G009 Red Top: <0.1 kU/L
Goldenrod: <0.1 kU/L
Helminthosporium halodes: <0.1 kU/L
House Dust Hollister: <0.1 kU/L
IgE (Immunoglobulin E), Serum: 23 kU/L (ref <115)
Lamb's Quarters: <0.1 kU/L
Oak: <0.1 kU/L
Plantain: <0.1 kU/L
Stemphylium Botryosum: <0.1 kU/L
Sycamore Tree: <0.1 kU/L
Timothy Grass: <0.1 kU/L

## 2014-10-17 NOTE — Telephone Encounter (Signed)
Caller name: Aylyn Wenzler Relationship to patient: self Can be reached: [phone] Pharmacy: [facility] at [place] - for refills  Reason for call: Pt called in regarding lab results for TSH last visit when she saw Percell Miller. She said she is feeling very sluggish and gaining weight again. She is asking if her medication dose is going to be adjusted. Please notify pt.

## 2014-10-17 NOTE — Telephone Encounter (Signed)
Pt wants thyroid med increased but her tsh was in mid normal range. Thus this does not appear to be the cause. I reviewed pulmonology note and I think her low 02% at night related to poor sleep is the cause. Would not recommend increasing dose of thyroid med. Recommend doing sleep study and follow treatment plan of pulmonologist.

## 2014-10-18 ENCOUNTER — Other Ambulatory Visit: Payer: Self-pay

## 2014-10-18 DIAGNOSIS — K219 Gastro-esophageal reflux disease without esophagitis: Secondary | ICD-10-CM

## 2014-10-18 MED ORDER — PANTOPRAZOLE SODIUM 40 MG PO TBEC
40.0000 mg | DELAYED_RELEASE_TABLET | Freq: Every day | ORAL | Status: DC
Start: 2014-10-18 — End: 2015-02-06

## 2014-10-18 NOTE — Telephone Encounter (Signed)
Patient coming in Tuesday am for TSH evaluation.

## 2014-10-22 ENCOUNTER — Encounter: Payer: Self-pay | Admitting: Medical

## 2014-10-22 ENCOUNTER — Telehealth: Payer: Self-pay | Admitting: Family Medicine

## 2014-10-22 ENCOUNTER — Ambulatory Visit (INDEPENDENT_AMBULATORY_CARE_PROVIDER_SITE_OTHER): Payer: 59 | Admitting: Medical

## 2014-10-22 VITALS — BP 142/100 | HR 92 | Temp 99.0°F | Ht 65.0 in | Wt 191.4 lb

## 2014-10-22 DIAGNOSIS — R5383 Other fatigue: Secondary | ICD-10-CM | POA: Diagnosis not present

## 2014-10-22 DIAGNOSIS — E038 Other specified hypothyroidism: Secondary | ICD-10-CM | POA: Diagnosis not present

## 2014-10-22 DIAGNOSIS — G4733 Obstructive sleep apnea (adult) (pediatric): Secondary | ICD-10-CM | POA: Diagnosis not present

## 2014-10-22 LAB — T4, FREE: Free T4: 0.97 ng/dL (ref 0.60–1.60)

## 2014-10-22 LAB — TSH: TSH: 0.25 u[IU]/mL — ABNORMAL LOW (ref 0.35–4.50)

## 2014-10-22 LAB — T3, FREE: T3, Free: 3.7 pg/mL (ref 2.3–4.2)

## 2014-10-22 NOTE — Assessment & Plan Note (Signed)
Awaiting sleeping study.

## 2014-10-22 NOTE — Telephone Encounter (Signed)
Relation to pt: self Call back number: [phone]   Reason for call:  Pt viewed lab results on MyChart and would like to review

## 2014-10-22 NOTE — Assessment & Plan Note (Signed)
Your tsh was normal. After discussion with you I want to repeat today but get t3 and t4 as well.

## 2014-10-22 NOTE — Patient Instructions (Addendum)
Fatigue Lab work up was negative for obvious causes.  Hypothyroidism Your tsh was normal. After discussion with you I want to repeat today but get t3 and t4 as well.  OSA (obstructive sleep apnea) Awaiting sleeping study.    Follow up date to be determrined after labs results in

## 2014-10-22 NOTE — Progress Notes (Signed)
Pre visit review using our clinic review tool, if applicable. No additional management support is needed unless otherwise documented below in the visit note. 

## 2014-10-22 NOTE — Progress Notes (Signed)
Subjective:    Patient ID: Sarah Salazar, female    DOB: 06-18-1955, 59 y.o.   MRN: [id number]  HPI  Pt is awaiting approval to have sleep study. Pulmonology is trying to get at home study. Also some allergy test being done as well recenlty. Allergy test was negative by blood. Pt being evaluated for fatigue and snoring.   Pt tsh was normal. Most recently. Pt thinks her thyroid function is low. Frustrated because she can't loose weight and is fatigued.     Review of Systems  Constitutional: Positive for fatigue. Negative for fever, chills, diaphoresis and activity change.  Respiratory: Negative for cough, chest tightness and shortness of breath.        Snoring.  Cardiovascular: Negative for chest pain, palpitations and leg swelling.  Gastrointestinal: Negative for nausea, vomiting and abdominal pain.  Musculoskeletal: Negative for neck pain and neck stiffness.  Neurological: Negative for dizziness, tremors, seizures, syncope, facial asymmetry, speech difficulty, weakness, light-headedness, numbness and headaches.  Psychiatric/Behavioral: Negative for behavioral problems, confusion and agitation. The patient is not nervous/anxious.      Past Medical History  Diagnosis Date  . IBS (irritable bowel syndrome)   . Anxiety   . Depression   . Hypothyroidism     hypothyroidism  . Chest pain     Emergency room April 23, 2012, troponin is normal, chest CT scan showed no pulmonary embolus  . Cough     Chronic cough  . Ejection fraction     EF 55-60%, echo, December, 2013, lipomatous hypertrophy of the atrial septum  . GERD (gastroesophageal reflux disease)   . Personal history of failed moderate sedation 06/20/2012  . Personal history of colonic adenoma 06/28/2012  . Complication of anesthesia     "I'm allergic to versed and fentanyl"  . Family history of anesthesia complication     "hard time waking daughter up post SVT ablation"  . Pneumonia     "couple times" (08/01/2013)  .  H/O hiatal hernia   . Migraine     "once or twice" (08/01/2013)  . Bulging lumbar disc     "L3-5" (08/01/2013)  . Bipolar disorder     History   Social History  . Marital Status: Divorced    Spouse Name: N/A  . Number of Children: 1  . Years of Education: N/A   Occupational History  .     Social History Main Topics  . Smoking status: Never Smoker   . Smokeless tobacco: Never Used  . Alcohol Use: Yes     Comment: 08/01/2013 "glass of wine 6 times/year at most"  . Drug Use: No  . Sexual Activity: Yes   Other Topics Concern  . 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 Cholecystectomy    . Dilation and curettage of uterus  X 3  . Knee arthroscopy Right X 2  . Wrist surgery  1979-~ 2010    X 4  . Laparoscopy  1970's - 06/03/1990    "several; to evaluate dysfunctional menses & pelvic pain"  . Cystocele repair  01/13/2009  . Esophagogastroduodenoscopy (egd) with esophageal dilation  2004; 2014  . Colonoscopy  2002  . Elbow surgery Left 2005  . Breast lumpectomy Left     "  1st breast OR"  . Mastectomy, partial Left     "2nd breast OR", benign fibrocystic changes  . Wisdom tooth extraction      "all 4 at once"    Family History  Problem Relation Age of Onset  . Coronary artery disease Mother   . Stroke Mother     TIA  . Uterine cancer Mother     BREAST  . Lung cancer Father   . Heart disease Father   . Colon cancer Paternal Uncle     COLON; STOMACH  . COPD Father   . Thyroid disease Mother     goiter  . Lung cancer Maternal Grandfather   . Coronary artery disease Father   . Rheum arthritis Maternal Aunt   . Supraventricular tachycardia Daughter     S/P ablation    Allergies  Allergen Reactions  . Amoxicillin Rash       . Fentanyl Rash    given with Versed  . Midazolam Rash    given with  Fentanyl  . Oxycodone-Aspirin Rash  . Sulfonamide Derivatives Rash    rash  . Hydrocodone Hives and Nausea And Vomiting  . Nalbuphine Nausea And Vomiting  . Oxycodone-Acetaminophen Hives and Nausea And Vomiting  . Venlafaxine Other (See Comments)    Patient says makes her crazy  . Verapamil     Caused "pounding " in chest    Current Outpatient Prescriptions on File Prior to Visit  Medication Sig Dispense Refill  . albuterol (PROAIR HFA) 108 (90 BASE) MCG/ACT inhaler Inhale 2 puffs into the lungs every 6 (six) hours as needed for wheezing or shortness of breath. 1 Inhaler 2  . beclomethasone (QVAR) 40 MCG/ACT inhaler Inhale 2 puffs into the lungs 2 (two) times daily. 1 Inhaler 12  . buPROPion (WELLBUTRIN) 100 MG tablet Take 100 mg by mouth at bedtime.    . fluticasone (FLONASE) 50 MCG/ACT nasal spray Place 2 sprays into both nostrils daily. 16 g 6  . hyoscyamine (LEVSIN SL) 0.125 MG SL tablet Place 1 tablet (0.125 mg total) under the tongue every 4 (four) hours as needed. 30 tablet 0  . lamoTRIgine (LAMICTAL) 150 MG tablet Take 150 mg by mouth at bedtime.    Marland Kitchen levothyroxine (SYNTHROID, LEVOTHROID) 100 MCG tablet Take 1 tablet (100 mcg total) by mouth daily. 90 tablet 1  . metoprolol tartrate (LOPRESSOR) 25 MG tablet Take 0.5 tablets (12.5 mg total) by mouth 2 (two) times daily. 180 tablet 1  . pantoprazole (PROTONIX) 40 MG tablet Take 1 tablet (40 mg total) by mouth daily. 90 tablet 1   No current facility-administered medications on file prior to visit.    BP 142/100 mmHg  Pulse 92  Temp(Src) 99 F (37.2 C) (Oral)  Ht 5\' 5"  (1.651 m)  Wt 191 lb 6.4 oz (86.818 kg)  BMI 31.85 kg/m2  SpO2 96%        Objective:   Physical Exam  General- No acute distress. Pleasant patient. Neck- Full range of motion, no jvd. No thyromegaly.  Lungs- Clear, even and unlabored. Heart- regular rate and rhythm. Neurologic- CNII- XII grossly intact.  Abdomen Inspection:-Inspection Normal.    Palpation/Perucssion: Palpation and Percussion of the abdomen reveal- Non Tender, No Rebound tenderness, No rigidity(Guarding) and No Palpable abdominal masses.  Liver:-Normal.  Spleen:- Normal.         Assessment & Plan:

## 2014-10-22 NOTE — Assessment & Plan Note (Signed)
Lab work up was negative for obvious causes.

## 2014-10-29 ENCOUNTER — Encounter: Payer: Self-pay | Admitting: Pulmonary Disease

## 2014-10-29 ENCOUNTER — Telehealth: Payer: Self-pay | Admitting: Family Medicine

## 2014-10-29 DIAGNOSIS — Z1231 Encounter for screening mammogram for malignant neoplasm of breast: Secondary | ICD-10-CM

## 2014-10-29 DIAGNOSIS — G4730 Sleep apnea, unspecified: Secondary | ICD-10-CM | POA: Diagnosis not present

## 2014-10-29 NOTE — Telephone Encounter (Signed)
Relation to pt: self  Call back number:   Reason for call:  Pt requesting a script for mamo for the [facility] [place] and pt scheduled physical for 12/06/14

## 2014-10-29 NOTE — Telephone Encounter (Signed)
Patient can call and schedule [phone].   KP

## 2014-10-30 ENCOUNTER — Telehealth: Payer: Self-pay | Admitting: Pulmonary Disease

## 2014-10-30 DIAGNOSIS — G4733 Obstructive sleep apnea (adult) (pediatric): Secondary | ICD-10-CM

## 2014-10-30 DIAGNOSIS — G4730 Sleep apnea, unspecified: Secondary | ICD-10-CM | POA: Diagnosis not present

## 2014-10-30 NOTE — Telephone Encounter (Signed)
Pt returned call. Informed her of the results and recs per RA. Order placed. Pt verbalized understanding and denied any further questions or concerns at this time.

## 2014-10-30 NOTE — Telephone Encounter (Signed)
Mild OSA - 11/hr CPAP Titration - since she is symptomatic

## 2014-10-30 NOTE — Telephone Encounter (Signed)
lmtcb

## 2014-10-31 ENCOUNTER — Encounter: Payer: Self-pay | Admitting: Pulmonary Disease

## 2014-10-31 ENCOUNTER — Other Ambulatory Visit: Payer: Self-pay | Admitting: *Deleted

## 2014-10-31 DIAGNOSIS — G4733 Obstructive sleep apnea (adult) (pediatric): Secondary | ICD-10-CM

## 2014-11-11 ENCOUNTER — Telehealth: Payer: Self-pay | Admitting: Family Medicine

## 2014-11-11 ENCOUNTER — Ambulatory Visit: Payer: 59 | Admitting: Family Medicine

## 2014-11-11 NOTE — Telephone Encounter (Signed)
No -- not this time

## 2014-11-11 NOTE — Telephone Encounter (Signed)
Pt no show 11/11/14 2:45pm. Pt called in 11/11/14 8:52am to schedule appt, called back 1:07 to change appt to 11/12/14 6:00pm. Pt states she would have to use vacation time to come in. Charge no show?

## 2014-11-12 ENCOUNTER — Encounter: Payer: Self-pay | Admitting: Family Medicine

## 2014-11-12 ENCOUNTER — Ambulatory Visit (INDEPENDENT_AMBULATORY_CARE_PROVIDER_SITE_OTHER): Payer: 59 | Admitting: Family Medicine

## 2014-11-12 VITALS — BP 142/80 | HR 80 | Temp 99.4°F | Ht 65.0 in | Wt 192.8 lb

## 2014-11-12 DIAGNOSIS — J208 Acute bronchitis due to other specified organisms: Secondary | ICD-10-CM

## 2014-11-12 DIAGNOSIS — J0110 Acute frontal sinusitis, unspecified: Secondary | ICD-10-CM | POA: Diagnosis not present

## 2014-11-12 MED ORDER — FLUTICASONE PROPIONATE 50 MCG/ACT NA SUSP: 2.0000 | Freq: Every day | NASAL | Status: DC

## 2014-11-12 MED ORDER — CLARITHROMYCIN ER 500 MG PO TB24: 1000.0000 mg | ORAL_TABLET | Freq: Every day | ORAL | Status: AC

## 2014-11-12 NOTE — Progress Notes (Signed)
  Subjective:     MALITA Salazar is a 59 y.o. female who presents for evaluation of sinus pain. Symptoms include: congestion, cough, facial pain, headaches, mouth breathing, nasal congestion, post nasal drip, sinus pressure and sore throat. Onset of symptoms was 1 week ago. Symptoms have been gradually worsening since that time. Past history is significant for no history of pneumonia or bronchitis. Patient is a non-smoker.  The following portions of the patient's history were reviewed and updated as appropriate: allergies, current medications, past family history, past medical history, past social history, past surgical history and problem list.  Review of Systems Pertinent items are noted in HPI.   Objective:    BP 142/80 mmHg  Pulse 80  Temp(Src) 99.4 F (37.4 C) (Oral)  Ht 5\' 5"  (1.651 m)  Wt 192 lb 12.8 oz (87.454 kg)  BMI 32.08 kg/m2  SpO2 97% General appearance: alert, cooperative, appears stated age and no distress Ears: normal TM's and external ear canals both ears Nose: green discharge, moderate congestion, turbinates red, swollen, sinus tenderness bilateral Throat: lips, mucosa, and tongue normal; teeth and gums normal Neck: moderate anterior cervical adenopathy, supple, symmetrical, trachea midline and thyroid not enlarged, symmetric, no tenderness/mass/nodules Lungs: clear to auscultation bilaterally Heart: S1, S2 normal    Assessment:    Acute bacterial sinusitis.    Plan:    Nasal steroids per medication orders. Antihistamines per medication orders. Biaxin per medication orders.

## 2014-11-12 NOTE — Progress Notes (Signed)
Pre visit review using our clinic review tool, if applicable. No additional management support is needed unless otherwise documented below in the visit note. 

## 2014-11-12 NOTE — Patient Instructions (Signed)

## 2014-11-18 ENCOUNTER — Telehealth: Payer: Self-pay | Admitting: Family Medicine

## 2014-11-18 NOTE — Telephone Encounter (Signed)
Pre visit letter mailed 11/15/14 °

## 2014-11-25 ENCOUNTER — Encounter: Payer: Self-pay | Admitting: Family

## 2014-11-25 ENCOUNTER — Ambulatory Visit (INDEPENDENT_AMBULATORY_CARE_PROVIDER_SITE_OTHER): Payer: 59 | Admitting: Family

## 2014-11-25 VITALS — BP 124/82 | HR 62 | Temp 97.8°F | Resp 16 | Ht 65.0 in | Wt 192.8 lb

## 2014-11-25 DIAGNOSIS — R05 Cough: Secondary | ICD-10-CM | POA: Diagnosis not present

## 2014-11-25 DIAGNOSIS — R059 Cough, unspecified: Secondary | ICD-10-CM

## 2014-11-25 MED ORDER — BENZONATATE 100 MG PO CAPS: 100.0000 mg | ORAL_CAPSULE | Freq: Three times a day (TID) | ORAL | Status: DC | PRN

## 2014-11-25 NOTE — Assessment & Plan Note (Signed)
No overt wheezing but could be element of reactive airway due to recent sinus drainage. She has qvar and albuterol at home and I have advised her to restart.  Add tessalon perles, salt water gargles, call if symptoms not improved in 3-4 days.  Excuse not provided for work.

## 2014-11-25 NOTE — Progress Notes (Signed)
Pre visit review using our clinic review tool, if applicable. No additional management support is needed unless otherwise documented below in the visit note. 

## 2014-11-25 NOTE — Progress Notes (Signed)
Subjective:    Patient ID: Sarah Salazar, female    DOB: [birth year], 59 y.o.   MRN: [id number]  HPI  Sarah Salazar is a 59 yr old female who presents today with chief complaint of cough.  Cough began today and is associated with hoarseness and left ear pain. She was seen on 7/12 by Dr. Etter Sjogren and treated with biaxin for sinusitis. Works for a call center.  Reports that her sinus symptoms have improved.     Review of Systems See HPI  Past Medical History  Diagnosis Date  . IBS (irritable bowel syndrome)   . Anxiety   . Depression   . Hypothyroidism     hypothyroidism  . Chest pain     Emergency room April 23, 2012, troponin is normal, chest CT scan showed no pulmonary embolus  . Cough     Chronic cough  . Ejection fraction     EF 55-60%, echo, December, 2013, lipomatous hypertrophy of the atrial septum  . GERD (gastroesophageal reflux disease)   . Personal history of failed moderate sedation 06/20/2012  . Personal history of colonic adenoma 06/28/2012  . Complication of anesthesia     "I'm allergic to versed and fentanyl"  . Family history of anesthesia complication     "hard time waking daughter up post SVT ablation"  . Pneumonia     "couple times" (08/01/2013)  . H/O hiatal hernia   . Migraine     "once or twice" (08/01/2013)  . Bulging lumbar disc     "L3-5" (08/01/2013)  . Bipolar disorder     History   Social History  . Marital Status: Divorced    Spouse Name: N/A  . Number of Children: 1  . Years of Education: N/A   Occupational History  .     Social History Main Topics  . Smoking status: Never Smoker   . Smokeless tobacco: Never Used  . Alcohol Use: Yes     Comment: 08/01/2013 "glass of wine 6 times/year at most"  . Drug Use: No  . Sexual Activity: Yes   Other Topics Concern  . Not on file   Social History Narrative   Divorced, has boyfriend   1 daughter   Patient accounts at [employer]   1 caffeinated beverage/day             Past Surgical History   Procedure Laterality Date  . Cholecystectomy    . Dilation and curettage of uterus  X 3  . Knee arthroscopy Right X 2  . Wrist surgery  1979-~ 2010    X 4  . Laparoscopy  1970's - 06/03/1990    "several; to evaluate dysfunctional menses & pelvic pain"  . Cystocele repair  01/13/2009  . Esophagogastroduodenoscopy (egd) with esophageal dilation  2004; 2014  . Colonoscopy  2002  . Elbow surgery Left 2005  . Breast lumpectomy Left     "1st breast OR"  . Mastectomy, partial Left     "2nd breast OR", benign fibrocystic changes  . Wisdom tooth extraction      "all 4 at once"    Family History  Problem Relation Age of Onset  . Coronary artery disease Mother   . Stroke Mother     TIA  . Uterine cancer Mother     BREAST  . Lung cancer Father   . Heart disease Father   . Colon cancer Paternal Uncle     COLON; STOMACH  . COPD Father   .  Thyroid disease Mother     goiter  . Lung cancer Maternal Grandfather   . Coronary artery disease Father   . Rheum arthritis Maternal Aunt   . Supraventricular tachycardia Daughter     S/P ablation    Allergies  Allergen Reactions  . Amoxicillin Rash       . Fentanyl Rash    given with Versed  . Midazolam Rash    given with Fentanyl  . Oxycodone-Aspirin Rash  . Sulfonamide Derivatives Rash    rash  . Hydrocodone Hives and Nausea And Vomiting  . Nalbuphine Nausea And Vomiting  . Oxycodone-Acetaminophen Hives and Nausea And Vomiting  . Venlafaxine Other (See Comments)    Patient says makes her crazy  . Verapamil     Caused "pounding " in chest    Current Outpatient Prescriptions on File Prior to Visit  Medication Sig Dispense Refill  . albuterol (PROAIR HFA) 108 (90 BASE) MCG/ACT inhaler Inhale 2 puffs into the lungs every 6 (six) hours as needed for wheezing or shortness of breath. 1 Inhaler 2  . ALPRAZolam (XANAX) 1 MG tablet Take 1 tablet by mouth as needed.    . beclomethasone (QVAR) 40 MCG/ACT inhaler Inhale 2 puffs into the  lungs 2 (two) times daily. 1 Inhaler 12  . buPROPion (WELLBUTRIN) 100 MG tablet Take 100 mg by mouth at bedtime.    . fluticasone (FLONASE) 50 MCG/ACT nasal spray Place 2 sprays into both nostrils daily. 16 g 6  . hyoscyamine (LEVSIN SL) 0.125 MG SL tablet Place 1 tablet (0.125 mg total) under the tongue every 4 (four) hours as needed. 30 tablet 0  . lamoTRIgine (LAMICTAL) 200 MG tablet Take 1 tablet by mouth daily.    Marland Kitchen levothyroxine (SYNTHROID, LEVOTHROID) 100 MCG tablet Take 1 tablet (100 mcg total) by mouth daily. 90 tablet 1  . metoprolol tartrate (LOPRESSOR) 25 MG tablet Take 0.5 tablets (12.5 mg total) by mouth 2 (two) times daily. 180 tablet 1  . pantoprazole (PROTONIX) 40 MG tablet Take 1 tablet (40 mg total) by mouth daily. 90 tablet 1   No current facility-administered medications on file prior to visit.    BP 124/82 mmHg  Pulse 62  Temp(Src) 97.8 F (36.6 C) (Oral)  Resp 16  Ht 5\' 5"  (1.651 m)  Wt 192 lb 12.8 oz (87.454 kg)  BMI 32.08 kg/m2  SpO2 97%       Objective:   Physical Exam  Constitutional: She appears well-developed and well-nourished.  HENT:  Right Ear: Ear canal normal. Tympanic membrane is not erythematous.  Left Ear: Ear canal normal. Tympanic membrane is not erythematous.  Mouth/Throat: No oropharyngeal exudate, posterior oropharyngeal edema or posterior oropharyngeal erythema.  Bilateral TM scarring noted Voice hoarseness noted.   Cardiovascular: Normal rate, regular rhythm and normal heart sounds.   No murmur heard. Pulmonary/Chest: Effort normal and breath sounds normal. No respiratory distress. She has no wheezes.  Psychiatric: She has a normal mood and affect. Her behavior is normal. Judgment and thought content normal.          Assessment & Plan:

## 2014-11-25 NOTE — Patient Instructions (Signed)
You may use tessalon as needed for cough. Restart qvar 2 puffs twice daily. Use albuterol 2 puffs every 6 hours as needed. Call if symptoms worsen, if you develop fever, or if symptoms are not improved in 3-4 days.

## 2014-11-28 ENCOUNTER — Telehealth: Payer: Self-pay | Admitting: Family

## 2014-11-28 NOTE — Telephone Encounter (Signed)
Left a message for call back.  

## 2014-11-28 NOTE — Telephone Encounter (Signed)
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 Please give pt letter. Tks

## 2014-11-28 NOTE — Telephone Encounter (Signed)
Caller name: Marlowe Cinquemani Relationship to patient: self Can be reached: [phone] Pharmacy:  Reason for call: Pt called in. Voice is in and out. 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 She said Melissa advised her to call if not better. She needs a note to be out of work for today since her voice is almost gone again. Pt states cough is gone. Had voice the last 2 days but now gone again. Taking tessalon for cough but nothing else.

## 2014-11-28 NOTE — Telephone Encounter (Signed)
Please advise 

## 2014-11-28 NOTE — Telephone Encounter (Signed)
Pt made aware that letter was ready.  She asked that letter be sent to her via mychart.  Letter sent.

## 2014-12-05 ENCOUNTER — Telehealth: Payer: Self-pay | Admitting: Behavioral Health

## 2014-12-05 NOTE — Telephone Encounter (Signed)
Unable to reach patient at time of Pre-Visit Call.  Could not leave a message on the voicemail.

## 2014-12-06 ENCOUNTER — Other Ambulatory Visit (HOSPITAL_COMMUNITY)
Admission: RE | Admit: 2014-12-06 | Discharge: 2014-12-06 | Disposition: A | Discharge status: Home / Self Care | Payer: 59 | Source: Ambulatory Visit | Attending: Family Medicine | Admitting: Family Medicine

## 2014-12-06 ENCOUNTER — Encounter: Payer: Self-pay | Admitting: Family Medicine

## 2014-12-06 ENCOUNTER — Ambulatory Visit (INDEPENDENT_AMBULATORY_CARE_PROVIDER_SITE_OTHER): Payer: 59 | Admitting: Family Medicine

## 2014-12-06 VITALS — BP 122/84 | HR 70 | Temp 98.1°F | Ht 65.0 in | Wt 194.0 lb

## 2014-12-06 DIAGNOSIS — Z1239 Encounter for other screening for malignant neoplasm of breast: Secondary | ICD-10-CM | POA: Diagnosis not present

## 2014-12-06 DIAGNOSIS — E039 Hypothyroidism, unspecified: Secondary | ICD-10-CM

## 2014-12-06 DIAGNOSIS — Z0000 Encounter for general adult medical examination without abnormal findings: Secondary | ICD-10-CM

## 2014-12-06 DIAGNOSIS — Z1151 Encounter for screening for human papillomavirus (HPV): Secondary | ICD-10-CM | POA: Insufficient documentation

## 2014-12-06 DIAGNOSIS — I10 Essential (primary) hypertension: Secondary | ICD-10-CM

## 2014-12-06 DIAGNOSIS — Z01419 Encounter for gynecological examination (general) (routine) without abnormal findings: Secondary | ICD-10-CM | POA: Insufficient documentation

## 2014-12-06 DIAGNOSIS — Z124 Encounter for screening for malignant neoplasm of cervix: Secondary | ICD-10-CM

## 2014-12-06 LAB — CBC WITH DIFFERENTIAL/PLATELET
Basophils Absolute: 0 10*3/uL (ref 0.0–0.1)
Basophils Relative: 0 % (ref 0–1)
Eosinophils Absolute: 0.2 10*3/uL (ref 0.0–0.7)
Eosinophils Relative: 3 % (ref 0–5)
HCT: 41.5 % (ref 36.0–46.0)
Hemoglobin: 13.8 g/dL (ref 12.0–15.0)
Lymphocytes Relative: 30 % (ref 12–46)
Lymphs Abs: 2.2 10*3/uL (ref 0.7–4.0)
MCH: 30.9 pg (ref 26.0–34.0)
MCHC: 33.3 g/dL (ref 30.0–36.0)
MCV: 92.8 fL (ref 78.0–100.0)
MPV: 11.7 fL (ref 8.6–12.4)
Monocytes Absolute: 0.4 10*3/uL (ref 0.1–1.0)
Monocytes Relative: 6 % (ref 3–12)
Neutro Abs: 4.5 10*3/uL (ref 1.7–7.7)
Neutrophils Relative %: 61 % (ref 43–77)
Platelets: 212 10*3/uL (ref 150–400)
RBC: 4.47 MIL/uL (ref 3.87–5.11)
RDW: 13.9 % (ref 11.5–15.5)
WBC: 7.4 10*3/uL (ref 4.0–10.5)

## 2014-12-06 LAB — POCT URINALYSIS DIPSTICK
Bilirubin, UA: NEGATIVE
Blood, UA: NEGATIVE
Glucose, UA: NEGATIVE
Ketones, UA: NEGATIVE
Leukocytes, UA: NEGATIVE
Nitrite, UA: NEGATIVE
Protein, UA: NEGATIVE
Spec Grav, UA: 1.01
Urobilinogen, UA: 1
pH, UA: 6

## 2014-12-06 NOTE — Assessment & Plan Note (Signed)
con't synthroid Check labs  

## 2014-12-06 NOTE — Assessment & Plan Note (Signed)
con't metoprolol stable 

## 2014-12-06 NOTE — Progress Notes (Signed)
Pre visit review using our clinic review tool, if applicable. No additional management support is needed unless otherwise documented below in the visit note. 

## 2014-12-06 NOTE — Patient Instructions (Signed)
Preventive Care for Adults A healthy lifestyle and preventive care can promote health and wellness. Preventive health guidelines for women include the following key practices.  A routine yearly physical is a good way to check with your health care provider about your health and preventive screening. It is a chance to share any concerns and updates on your health and to receive a thorough exam.  Visit your dentist for a routine exam and preventive care every 6 months. Brush your teeth twice a day and floss once a day. Good oral hygiene prevents tooth decay and gum disease.  The frequency of eye exams is based on your age, health, family medical history, use of contact lenses, and other factors. Follow your health care provider's recommendations for frequency of eye exams.  Eat a healthy diet. Foods like vegetables, fruits, whole grains, low-fat dairy products, and lean protein foods contain the nutrients you need without too many calories. Decrease your intake of foods high in solid fats, added sugars, and salt. Eat the right amount of calories for you.Get information about a proper diet from your health care provider, if necessary.  Regular physical exercise is one of the most important things you can do for your health. Most adults should get at least 150 minutes of moderate-intensity exercise (any activity that increases your heart rate and causes you to sweat) each week. In addition, most adults need muscle-strengthening exercises on 2 or more days a week.  Maintain a healthy weight. The body mass index (BMI) is a screening tool to identify possible weight problems. It provides an estimate of body fat based on height and weight. Your health care provider can find your BMI and can help you achieve or maintain a healthy weight.For adults 20 years and older:  A BMI below 18.5 is considered underweight.  A BMI of 18.5 to 24.9 is normal.  A BMI of 25 to 29.9 is considered overweight.  A BMI of  30 and above is considered obese.  Maintain normal blood lipids and cholesterol levels by exercising and minimizing your intake of saturated fat. Eat a balanced diet with plenty of fruit and vegetables. Blood tests for lipids and cholesterol should begin at age 76 and be repeated every 5 years. If your lipid or cholesterol levels are high, you are over 50, or you are at high risk for heart disease, you may need your cholesterol levels checked more frequently.Ongoing high lipid and cholesterol levels should be treated with medicines if diet and exercise are not working.  If you smoke, find out from your health care provider how to quit. If you do not use tobacco, do not start.  Lung cancer screening is recommended for adults aged 22-80 years who are at high risk for developing lung cancer because of a history of smoking. A yearly low-dose CT scan of the lungs is recommended for people who have at least a 30-pack-year history of smoking and are a current smoker or have quit within the past 15 years. A pack year of smoking is smoking an average of 1 pack of cigarettes a day for 1 year (for example: 1 pack a day for 30 years or 2 packs a day for 15 years). Yearly screening should continue until the smoker has stopped smoking for at least 15 years. Yearly screening should be stopped for people who develop a health problem that would prevent them from having lung cancer treatment.  If you are pregnant, do not drink alcohol. If you are breastfeeding,  be very cautious about drinking alcohol. If you are not pregnant and choose to drink alcohol, do not have more than 1 drink per day. One drink is considered to be 12 ounces (355 mL) of beer, 5 ounces (148 mL) of wine, or 1.5 ounces (44 mL) of liquor.  Avoid use of street drugs. Do not share needles with anyone. Ask for help if you need support or instructions about stopping the use of drugs.  High blood pressure causes heart disease and increases the risk of  stroke. Your blood pressure should be checked at least every 1 to 2 years. Ongoing high blood pressure should be treated with medicines if weight loss and exercise do not work.  If you are 75-52 years old, ask your health care provider if you should take aspirin to prevent strokes.  Diabetes screening involves taking a blood sample to check your fasting blood sugar level. This should be done once every 3 years, after age 15, if you are within normal weight and without risk factors for diabetes. Testing should be considered at a younger age or be carried out more frequently if you are overweight and have at least 1 risk factor for diabetes.  Breast cancer screening is essential preventive care for women. You should practice "breast self-awareness." This means understanding the normal appearance and feel of your breasts and may include breast self-examination. Any changes detected, no matter how small, should be reported to a health care provider. Women in their 58s and 30s should have a clinical breast exam (CBE) by a health care provider as part of a regular health exam every 1 to 3 years. After age 16, women should have a CBE every year. Starting at age 53, women should consider having a mammogram (breast X-ray test) every year. Women who have a family history of breast cancer should talk to their health care provider about genetic screening. Women at a high risk of breast cancer should talk to their health care providers about having an MRI and a mammogram every year.  Breast cancer gene (BRCA)-related cancer risk assessment is recommended for women who have family members with BRCA-related cancers. BRCA-related cancers include breast, ovarian, tubal, and peritoneal cancers. Having family members with these cancers may be associated with an increased risk for harmful changes (mutations) in the breast cancer genes BRCA1 and BRCA2. Results of the assessment will determine the need for genetic counseling and  BRCA1 and BRCA2 testing.  Routine pelvic exams to screen for cancer are no longer recommended for nonpregnant women who are considered low risk for cancer of the pelvic organs (ovaries, uterus, and vagina) and who do not have symptoms. Ask your health care provider if a screening pelvic exam is right for you.  If you have had past treatment for cervical cancer or a condition that could lead to cancer, you need Pap tests and screening for cancer for at least 20 years after your treatment. If Pap tests have been discontinued, your risk factors (such as having a new sexual partner) need to be reassessed to determine if screening should be resumed. Some women have medical problems that increase the chance of getting cervical cancer. In these cases, your health care provider may recommend more frequent screening and Pap tests.  The HPV test is an additional test that may be used for cervical cancer screening. The HPV test looks for the virus that can cause the cell changes on the cervix. The cells collected during the Pap test can be  tested for HPV. The HPV test could be used to screen women aged 30 years and older, and should be used in women of any age who have unclear Pap test results. After the age of 30, women should have HPV testing at the same frequency as a Pap test.  Colorectal cancer can be detected and often prevented. Most routine colorectal cancer screening begins at the age of 50 years and continues through age 75 years. However, your health care provider may recommend screening at an earlier age if you have risk factors for colon cancer. On a yearly basis, your health care provider may provide home test kits to check for hidden blood in the stool. Use of a small camera at the end of a tube, to directly examine the colon (sigmoidoscopy or colonoscopy), can detect the earliest forms of colorectal cancer. Talk to your health care provider about this at age 50, when routine screening begins. Direct  exam of the colon should be repeated every 5-10 years through age 75 years, unless early forms of pre-cancerous polyps or small growths are found.  People who are at an increased risk for hepatitis B should be screened for this virus. You are considered at high risk for hepatitis B if:  You were born in a country where hepatitis B occurs often. Talk with your health care provider about which countries are considered high risk.  Your parents were born in a high-risk country and you have not received a shot to protect against hepatitis B (hepatitis B vaccine).  You have HIV or AIDS.  You use needles to inject street drugs.  You live with, or have sex with, someone who has hepatitis B.  You get hemodialysis treatment.  You take certain medicines for conditions like cancer, organ transplantation, and autoimmune conditions.  Hepatitis C blood testing is recommended for all people born from 1945 through 1965 and any individual with known risks for hepatitis C.  Practice safe sex. Use condoms and avoid high-risk sexual practices to reduce the spread of sexually transmitted infections (STIs). STIs include gonorrhea, chlamydia, syphilis, trichomonas, herpes, HPV, and human immunodeficiency virus (HIV). Herpes, HIV, and HPV are viral illnesses that have no cure. They can result in disability, cancer, and death.  You should be screened for sexually transmitted illnesses (STIs) including gonorrhea and chlamydia if:  You are sexually active and are younger than 24 years.  You are older than 24 years and your health care provider tells you that you are at risk for this type of infection.  Your sexual activity has changed since you were last screened and you are at an increased risk for chlamydia or gonorrhea. Ask your health care provider if you are at risk.  If you are at risk of being infected with HIV, it is recommended that you take a prescription medicine daily to prevent HIV infection. This is  called preexposure prophylaxis (PrEP). You are considered at risk if:  You are a heterosexual woman, are sexually active, and are at increased risk for HIV infection.  You take drugs by injection.  You are sexually active with a partner who has HIV.  Talk with your health care provider about whether you are at high risk of being infected with HIV. If you choose to begin PrEP, you should first be tested for HIV. You should then be tested every 3 months for as long as you are taking PrEP.  Osteoporosis is a disease in which the bones lose minerals and strength   with aging. This can result in serious bone fractures or breaks. The risk of osteoporosis can be identified using a bone density scan. Women ages 65 years and over and women at risk for fractures or osteoporosis should discuss screening with their health care providers. Ask your health care provider whether you should take a calcium supplement or vitamin D to reduce the rate of osteoporosis.  Menopause can be associated with physical symptoms and risks. Hormone replacement therapy is available to decrease symptoms and risks. You should talk to your health care provider about whether hormone replacement therapy is right for you.  Use sunscreen. Apply sunscreen liberally and repeatedly throughout the day. You should seek shade when your shadow is shorter than you. Protect yourself by wearing long sleeves, pants, a wide-brimmed hat, and sunglasses year round, whenever you are outdoors.  Once a month, do a whole body skin exam, using a mirror to look at the skin on your back. Tell your health care provider of new moles, moles that have irregular borders, moles that are larger than a pencil eraser, or moles that have changed in shape or color.  Stay current with required vaccines (immunizations).  Influenza vaccine. All adults should be immunized every year.  Tetanus, diphtheria, and acellular pertussis (Td, Tdap) vaccine. Pregnant women should  receive 1 dose of Tdap vaccine during each pregnancy. The dose should be obtained regardless of the length of time since the last dose. Immunization is preferred during the 27th-36th week of gestation. An adult who has not previously received Tdap or who does not know her vaccine status should receive 1 dose of Tdap. This initial dose should be followed by tetanus and diphtheria toxoids (Td) booster doses every 10 years. Adults with an unknown or incomplete history of completing a 3-dose immunization series with Td-containing vaccines should begin or complete a primary immunization series including a Tdap dose. Adults should receive a Td booster every 10 years.  Varicella vaccine. An adult without evidence of immunity to varicella should receive 2 doses or a second dose if she has previously received 1 dose. Pregnant females who do not have evidence of immunity should receive the first dose after pregnancy. This first dose should be obtained before leaving the health care facility. The second dose should be obtained 4-8 weeks after the first dose.  Human papillomavirus (HPV) vaccine. Females aged 13-26 years who have not received the vaccine previously should obtain the 3-dose series. The vaccine is not recommended for use in pregnant females. However, pregnancy testing is not needed before receiving a dose. If a female is found to be pregnant after receiving a dose, no treatment is needed. In that case, the remaining doses should be delayed until after the pregnancy. Immunization is recommended for any person with an immunocompromised condition through the age of 26 years if she did not get any or all doses earlier. During the 3-dose series, the second dose should be obtained 4-8 weeks after the first dose. The third dose should be obtained 24 weeks after the first dose and 16 weeks after the second dose.  Zoster vaccine. One dose is recommended for adults aged 60 years or older unless certain conditions are  present.  Measles, mumps, and rubella (MMR) vaccine. Adults born before 1957 generally are considered immune to measles and mumps. Adults born in 1957 or later should have 1 or more doses of MMR vaccine unless there is a contraindication to the vaccine or there is laboratory evidence of immunity to   each of the three diseases. A routine second dose of MMR vaccine should be obtained at least 28 days after the first dose for students attending postsecondary schools, health care workers, or international travelers. People who received inactivated measles vaccine or an unknown type of measles vaccine during 1963-1967 should receive 2 doses of MMR vaccine. People who received inactivated mumps vaccine or an unknown type of mumps vaccine before 1979 and are at high risk for mumps infection should consider immunization with 2 doses of MMR vaccine. For females of childbearing age, rubella immunity should be determined. If there is no evidence of immunity, females who are not pregnant should be vaccinated. If there is no evidence of immunity, females who are pregnant should delay immunization until after pregnancy. Unvaccinated health care workers born before 1957 who lack laboratory evidence of measles, mumps, or rubella immunity or laboratory confirmation of disease should consider measles and mumps immunization with 2 doses of MMR vaccine or rubella immunization with 1 dose of MMR vaccine.  Pneumococcal 13-valent conjugate (PCV13) vaccine. When indicated, a person who is uncertain of her immunization history and has no record of immunization should receive the PCV13 vaccine. An adult aged 19 years or older who has certain medical conditions and has not been previously immunized should receive 1 dose of PCV13 vaccine. This PCV13 should be followed with a dose of pneumococcal polysaccharide (PPSV23) vaccine. The PPSV23 vaccine dose should be obtained at least 8 weeks after the dose of PCV13 vaccine. An adult aged 19  years or older who has certain medical conditions and previously received 1 or more doses of PPSV23 vaccine should receive 1 dose of PCV13. The PCV13 vaccine dose should be obtained 1 or more years after the last PPSV23 vaccine dose.  Pneumococcal polysaccharide (PPSV23) vaccine. When PCV13 is also indicated, PCV13 should be obtained first. All adults aged 65 years and older should be immunized. An adult younger than age 65 years who has certain medical conditions should be immunized. Any person who resides in a nursing home or long-term care facility should be immunized. An adult smoker should be immunized. People with an immunocompromised condition and certain other conditions should receive both PCV13 and PPSV23 vaccines. People with human immunodeficiency virus (HIV) infection should be immunized as soon as possible after diagnosis. Immunization during chemotherapy or radiation therapy should be avoided. Routine use of PPSV23 vaccine is not recommended for American Indians, [place] Natives, or people younger than 65 years unless there are medical conditions that require PPSV23 vaccine. When indicated, people who have unknown immunization and have no record of immunization should receive PPSV23 vaccine. One-time revaccination 5 years after the first dose of PPSV23 is recommended for people aged 19-64 years who have chronic kidney failure, nephrotic syndrome, asplenia, or immunocompromised conditions. People who received 1-2 doses of PPSV23 before age 65 years should receive another dose of PPSV23 vaccine at age 65 years or later if at least 5 years have passed since the previous dose. Doses of PPSV23 are not needed for people immunized with PPSV23 at or after age 65 years.  Meningococcal vaccine. Adults with asplenia or persistent complement component deficiencies should receive 2 doses of quadrivalent meningococcal conjugate (MenACWY-D) vaccine. The doses should be obtained at least 2 months apart.  Microbiologists working with certain meningococcal bacteria, military recruits, people at risk during an outbreak, and people who travel to or live in countries with a high rate of meningitis should be immunized. A first-year college student up through age   21 years who is living in a residence hall should receive a dose if she did not receive a dose on or after her 16th birthday. Adults who have certain high-risk conditions should receive one or more doses of vaccine.  Hepatitis A vaccine. Adults who wish to be protected from this disease, have certain high-risk conditions, work with hepatitis A-infected animals, work in hepatitis A research labs, or travel to or work in countries with a high rate of hepatitis A should be immunized. Adults who were previously unvaccinated and who anticipate close contact with an international adoptee during the first 60 days after arrival in the [place] from a country with a high rate of hepatitis A should be immunized.  Hepatitis B vaccine. Adults who wish to be protected from this disease, have certain high-risk conditions, may be exposed to blood or other infectious body fluids, are household contacts or sex partners of hepatitis B positive people, are clients or workers in certain care facilities, or travel to or work in countries with a high rate of hepatitis B should be immunized.  Haemophilus influenzae type b (Hib) vaccine. A previously unvaccinated person with asplenia or sickle cell disease or having a scheduled splenectomy should receive 1 dose of Hib vaccine. Regardless of previous immunization, a recipient of a hematopoietic stem cell transplant should receive a 3-dose series 6-12 months after her successful transplant. Hib vaccine is not recommended for adults with HIV infection. Preventive Services / Frequency Ages 64 to 68 years  Blood pressure check.** / Every 1 to 2 years.  Lipid and cholesterol check.** / Every 5 years beginning at age  22.  Clinical breast exam.** / Every 3 years for women in their 88s and 53s.  BRCA-related cancer risk assessment.** / For women who have family members with a BRCA-related cancer (breast, ovarian, tubal, or peritoneal cancers).  Pap test.** / Every 2 years from ages 90 through 51. Every 3 years starting at age 21 through age 56 or 3 with a history of 3 consecutive normal Pap tests.  HPV screening.** / Every 3 years from ages 24 through ages 1 to 46 with a history of 3 consecutive normal Pap tests.  Hepatitis C blood test.** / For any individual with known risks for hepatitis C.  Skin self-exam. / Monthly.  Influenza vaccine. / Every year.  Tetanus, diphtheria, and acellular pertussis (Tdap, Td) vaccine.** / Consult your health care provider. Pregnant women should receive 1 dose of Tdap vaccine during each pregnancy. 1 dose of Td every 10 years.  Varicella vaccine.** / Consult your health care provider. Pregnant females who do not have evidence of immunity should receive the first dose after pregnancy.  HPV vaccine. / 3 doses over 6 months, if 72 and younger. The vaccine is not recommended for use in pregnant females. However, pregnancy testing is not needed before receiving a dose.  Measles, mumps, rubella (MMR) vaccine.** / You need at least 1 dose of MMR if you were born in 1957 or later. You may also need a 2nd dose. For females of childbearing age, rubella immunity should be determined. If there is no evidence of immunity, females who are not pregnant should be vaccinated. If there is no evidence of immunity, females who are pregnant should delay immunization until after pregnancy.  Pneumococcal 13-valent conjugate (PCV13) vaccine.** / Consult your health care provider.  Pneumococcal polysaccharide (PPSV23) vaccine.** / 1 to 2 doses if you smoke cigarettes or if you have certain conditions.  Meningococcal vaccine.** /  1 dose if you are age 19 to 21 years and a first-year college  student living in a residence hall, or have one of several medical conditions, you need to get vaccinated against meningococcal disease. You may also need additional booster doses.  Hepatitis A vaccine.** / Consult your health care provider.  Hepatitis B vaccine.** / Consult your health care provider.  Haemophilus influenzae type b (Hib) vaccine.** / Consult your health care provider. Ages 40 to 64 years  Blood pressure check.** / Every 1 to 2 years.  Lipid and cholesterol check.** / Every 5 years beginning at age 20 years.  Lung cancer screening. / Every year if you are aged 55-80 years and have a 30-pack-year history of smoking and currently smoke or have quit within the past 15 years. Yearly screening is stopped once you have quit smoking for at least 15 years or develop a health problem that would prevent you from having lung cancer treatment.  Clinical breast exam.** / Every year after age 40 years.  BRCA-related cancer risk assessment.** / For women who have family members with a BRCA-related cancer (breast, ovarian, tubal, or peritoneal cancers).  Mammogram.** / Every year beginning at age 40 years and continuing for as long as you are in good health. Consult with your health care provider.  Pap test.** / Every 3 years starting at age 30 years through age 65 or 70 years with a history of 3 consecutive normal Pap tests.  HPV screening.** / Every 3 years from ages 30 years through ages 65 to 70 years with a history of 3 consecutive normal Pap tests.  Fecal occult blood test (FOBT) of stool. / Every year beginning at age 50 years and continuing until age 75 years. You may not need to do this test if you get a colonoscopy every 10 years.  Flexible sigmoidoscopy or colonoscopy.** / Every 5 years for a flexible sigmoidoscopy or every 10 years for a colonoscopy beginning at age 50 years and continuing until age 75 years.  Hepatitis C blood test.** / For all people born from 1945 through  1965 and any individual with known risks for hepatitis C.  Skin self-exam. / Monthly.  Influenza vaccine. / Every year.  Tetanus, diphtheria, and acellular pertussis (Tdap/Td) vaccine.** / Consult your health care provider. Pregnant women should receive 1 dose of Tdap vaccine during each pregnancy. 1 dose of Td every 10 years.  Varicella vaccine.** / Consult your health care provider. Pregnant females who do not have evidence of immunity should receive the first dose after pregnancy.  Zoster vaccine.** / 1 dose for adults aged 60 years or older.  Measles, mumps, rubella (MMR) vaccine.** / You need at least 1 dose of MMR if you were born in 1957 or later. You may also need a 2nd dose. For females of childbearing age, rubella immunity should be determined. If there is no evidence of immunity, females who are not pregnant should be vaccinated. If there is no evidence of immunity, females who are pregnant should delay immunization until after pregnancy.  Pneumococcal 13-valent conjugate (PCV13) vaccine.** / Consult your health care provider.  Pneumococcal polysaccharide (PPSV23) vaccine.** / 1 to 2 doses if you smoke cigarettes or if you have certain conditions.  Meningococcal vaccine.** / Consult your health care provider.  Hepatitis A vaccine.** / Consult your health care provider.  Hepatitis B vaccine.** / Consult your health care provider.  Haemophilus influenzae type b (Hib) vaccine.** / Consult your health care provider. Ages 65   years and over  Blood pressure check.** / Every 1 to 2 years.  Lipid and cholesterol check.** / Every 5 years beginning at age 22 years.  Lung cancer screening. / Every year if you are aged 73-80 years and have a 30-pack-year history of smoking and currently smoke or have quit within the past 15 years. Yearly screening is stopped once you have quit smoking for at least 15 years or develop a health problem that would prevent you from having lung cancer  treatment.  Clinical breast exam.** / Every year after age 4 years.  BRCA-related cancer risk assessment.** / For women who have family members with a BRCA-related cancer (breast, ovarian, tubal, or peritoneal cancers).  Mammogram.** / Every year beginning at age 40 years and continuing for as long as you are in good health. Consult with your health care provider.  Pap test.** / Every 3 years starting at age 9 years through age 34 or 91 years with 3 consecutive normal Pap tests. Testing can be stopped between 65 and 70 years with 3 consecutive normal Pap tests and no abnormal Pap or HPV tests in the past 10 years.  HPV screening.** / Every 3 years from ages 57 years through ages 64 or 45 years with a history of 3 consecutive normal Pap tests. Testing can be stopped between 65 and 70 years with 3 consecutive normal Pap tests and no abnormal Pap or HPV tests in the past 10 years.  Fecal occult blood test (FOBT) of stool. / Every year beginning at age 15 years and continuing until age 17 years. You may not need to do this test if you get a colonoscopy every 10 years.  Flexible sigmoidoscopy or colonoscopy.** / Every 5 years for a flexible sigmoidoscopy or every 10 years for a colonoscopy beginning at age 86 years and continuing until age 71 years.  Hepatitis C blood test.** / For all people born from [birth year] through 1963 and any individual with known risks for hepatitis C.  Osteoporosis screening.** / A one-time screening for women ages 83 years and over and women at risk for fractures or osteoporosis.  Skin self-exam. / Monthly.  Influenza vaccine. / Every year.  Tetanus, diphtheria, and acellular pertussis (Tdap/Td) vaccine.** / 1 dose of Td every 10 years.  Varicella vaccine.** / Consult your health care provider.  Zoster vaccine.** / 1 dose for adults aged 61 years or older.  Pneumococcal 13-valent conjugate (PCV13) vaccine.** / Consult your health care provider.  Pneumococcal  polysaccharide (PPSV23) vaccine.** / 1 dose for all adults aged 28 years and older.  Meningococcal vaccine.** / Consult your health care provider.  Hepatitis A vaccine.** / Consult your health care provider.  Hepatitis B vaccine.** / Consult your health care provider.  Haemophilus influenzae type b (Hib) vaccine.** / Consult your health care provider. ** Family history and personal history of risk and conditions may change your health care provider's recommendations. Document Released: 06/15/2001 Document Revised: 09/03/2013 Document Reviewed: 09/14/2010 [facility] Patient Information 2015 [place], [place]. This information is not intended to replace advice given to you by your health care provider. Make sure you discuss any questions you have with your health care provider.

## 2014-12-06 NOTE — Progress Notes (Signed)
Subjective:     Sarah Salazar is a 59 y.o. female and is here for a comprehensive physical exam. The patient reports no problems.  History   Social History  . Marital Status: Divorced    Spouse Name: N/A  . Number of Children: 1  . Years of Education: N/A   Occupational History  .     Social History Main Topics  . Smoking status: Never Smoker   . Smokeless tobacco: Never Used  . Alcohol Use: Yes     Comment: 08/01/2013 "glass of wine 6 times/year at most"  . Drug Use: No  . Sexual Activity: Yes   Other Topics Concern  . 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 MAMMOGRAM  04/11/2012  . PAP SMEAR  04/11/2013  . INFLUENZA VACCINE  07/19/2015 (Originally 12/02/2014)  . HIV Screening  07/19/2015 (Originally 01/31/1973)  . COLONOSCOPY  06/21/2017  . TETANUS/TDAP  04/12/2023    The following portions of the patient's history were reviewed and updated as appropriate:  She  has a past medical history of IBS (irritable bowel syndrome); Anxiety; Depression; Hypothyroidism; Chest pain; Cough; Ejection fraction; GERD (gastroesophageal reflux disease); Personal history of failed moderate sedation (06/20/2012); Personal history of colonic adenoma (06/28/2012); Complication of anesthesia; Family history of anesthesia complication; Pneumonia; H/O hiatal hernia; Migraine; Bulging lumbar disc; and Bipolar disorder. She  does not have any pertinent problems on file. She  has past surgical history that includes Cholecystectomy; Dilation and curettage of uterus (X 3); Knee arthroscopy (Right, X 2); Wrist surgery (1979-~ 2010); laparoscopy (6962'X - 06/03/1990); Cystocele repair (01/13/2009); Esophagogastroduodenoscopy (egd) with esophageal dilation (2004; 2014); Colonoscopy (2002); Elbow surgery (Left, 2005); Breast lumpectomy (Left); Mastectomy, partial  (Left); and Wisdom tooth extraction. Her family history includes COPD in her father; Colon cancer in her paternal uncle; Coronary artery disease in her father and mother; Heart disease in her father; Lung cancer in her father and maternal grandfather; Rheum arthritis in her maternal aunt; Stroke in her mother; Supraventricular tachycardia in her daughter; Thyroid disease in her mother; Uterine cancer in her mother. She  reports that she has never smoked. She has never used smokeless tobacco. She reports that she drinks alcohol. She reports that she does not use illicit drugs. She has a current medication list which includes the following prescription(s): albuterol, alprazolam, beclomethasone, bupropion, fluticasone, hyoscyamine, lamotrigine, levothyroxine, metoprolol tartrate, and pantoprazole. Current Outpatient Prescriptions on File Prior to Visit  Medication Sig Dispense Refill  . albuterol (PROAIR HFA) 108 (90 BASE) MCG/ACT inhaler Inhale 2 puffs into the lungs every 6 (six) hours as needed for wheezing or shortness of breath. 1 Inhaler 2  . ALPRAZolam (XANAX) 1 MG tablet Take 1 tablet by mouth as needed.    . beclomethasone (QVAR) 40 MCG/ACT inhaler Inhale 2 puffs into the lungs 2 (two) times daily. 1 Inhaler 12  . buPROPion (WELLBUTRIN) 100 MG tablet Take 100 mg by mouth at bedtime.    . fluticasone (FLONASE) 50 MCG/ACT nasal spray Place 2 sprays into both nostrils daily. 16 g 6  . hyoscyamine (LEVSIN SL) 0.125 MG SL tablet Place 1 tablet (0.125 mg total) under the tongue every 4 (four) hours as needed. 30 tablet 0  . lamoTRIgine (LAMICTAL) 200 MG tablet Take  1 tablet by mouth daily.    Marland Kitchen levothyroxine (SYNTHROID, LEVOTHROID) 100 MCG tablet Take 1 tablet (100 mcg total) by mouth daily. 90 tablet 1  . metoprolol tartrate (LOPRESSOR) 25 MG tablet Take 0.5 tablets (12.5 mg total) by mouth 2 (two) times daily. 180 tablet 1  . pantoprazole (PROTONIX) 40 MG tablet Take 1 tablet (40 mg total) by mouth  daily. 90 tablet 1   No current facility-administered medications on file prior to visit.   She is allergic to amoxicillin; fentanyl; midazolam; oxycodone-aspirin; sulfonamide derivatives; hydrocodone; nalbuphine; oxycodone-acetaminophen; venlafaxine; and verapamil..  Review of Systems Review of Systems  Constitutional: Negative for activity change, appetite change and fatigue.  HENT: Negative for hearing loss, congestion, tinnitus and ear discharge.  dentist q22m Eyes: Negative for visual disturbance (see optho q1y -- vision corrected to 20/20 with glasses).  Respiratory: Negative for cough, chest tightness and shortness of breath.   Cardiovascular: Negative for chest pain, palpitations and leg swelling.  Gastrointestinal: Negative for abdominal pain, diarrhea, constipation and abdominal distention.  Genitourinary: Negative for urgency, frequency, decreased urine volume and difficulty urinating.  Musculoskeletal: Negative for back pain, arthralgias and gait problem.  Skin: Negative for color change, pallor and rash.  Neurological: Negative for dizziness, light-headedness, numbness and headaches.  Hematological: Negative for adenopathy. Does not bruise/bleed easily.  Psychiatric/Behavioral: Negative for suicidal ideas, confusion, sleep disturbance, self-injury, dysphoric mood, decreased concentration and agitation.       Objective:    BP 122/84 mmHg  Pulse 70  Temp(Src) 98.1 F (36.7 C) (Oral)  Ht 5\' 5"  (1.651 m)  Wt 194 lb (87.998 kg)  BMI 32.28 kg/m2  SpO2 97% General appearance: alert, cooperative, appears stated age and no distress Head: Normocephalic, without obvious abnormality, atraumatic Eyes: conjunctivae/corneas clear. PERRL, EOM's intact. Fundi benign. Ears: normal TM's and external ear canals both ears Nose: Nares normal. Septum midline. Mucosa normal. No drainage or sinus tenderness. Throat: lips, mucosa, and tongue normal; teeth and gums normal Neck: no  adenopathy, no carotid bruit, no JVD, supple, symmetrical, trachea midline and thyroid not enlarged, symmetric, no tenderness/mass/nodules Back: symmetric, no curvature. ROM normal. No CVA tenderness. Lungs: clear to auscultation bilaterally Breasts: normal appearance, no masses or tenderness Heart: regular rate and rhythm, S1, S2 normal, no murmur, click, rub or gallop Abdomen: soft, non-tender; bowel sounds normal; no masses,  no organomegaly Pelvic: cervix normal in appearance, external genitalia normal, no adnexal masses or tenderness, no cervical motion tenderness, rectovaginal septum normal, uterus normal size, shape, and consistency, vagina normal without discharge and pap done, rectal heme neg brown stool Extremities: extremities normal, atraumatic, no cyanosis or edema Pulses: 2+ and symmetric Skin: Skin color, texture, turgor normal. No rashes or lesions Lymph nodes: Cervical, supraclavicular, and axillary nodes normal. Neurologic: Alert and oriented X 3, normal strength and tone. Normal symmetric reflexes. Normal coordination and gait Psych- no depression, no anxiety      Assessment:    Healthy female exam.      Plan:    ghm utd Check labs See After Visit Summary for Counseling Recommendations    1. Preventative health care  - POCT urinalysis dipstick - Basic Metabolic Panel (BMET) - CBC w/Diff - Hepatic function panel - Lipid panel - TSH  2. Breast cancer screening  - MM DIGITAL SCREENING BILATERAL; Future - Basic Metabolic Panel (BMET) - CBC w/Diff - Hepatic function panel - Lipid panel - TSH  3. Screening for malignant neoplasm of cervix  - Cytology - PAP -  Basic Metabolic Panel (BMET) - CBC w/Diff - Hepatic function panel - Lipid panel - TSH

## 2014-12-07 LAB — TSH: TSH: 1.294 u[IU]/mL (ref 0.350–4.500)

## 2014-12-07 LAB — HEPATIC FUNCTION PANEL
ALT: 23 U/L (ref 6–29)
AST: 20 U/L (ref 10–35)
Albumin: 4 g/dL (ref 3.6–5.1)
Alkaline Phosphatase: 112 U/L (ref 33–130)
Bilirubin, Direct: 0.1 mg/dL (ref <=0.2)
Indirect Bilirubin: 0.2 mg/dL (ref 0.2–1.2)
Total Bilirubin: 0.3 mg/dL (ref 0.2–1.2)
Total Protein: 7 g/dL (ref 6.1–8.1)

## 2014-12-07 LAB — LIPID PANEL
Cholesterol: 197 mg/dL (ref 125–200)
HDL: 37 mg/dL — ABNORMAL LOW (ref >=46)
LDL Cholesterol: 122 mg/dL (ref <130)
Total CHOL/HDL Ratio: 5.3 Ratio — ABNORMAL HIGH (ref <=5.0)
Triglycerides: 189 mg/dL — ABNORMAL HIGH (ref <150)
VLDL: 38 mg/dL — ABNORMAL HIGH (ref <30)

## 2014-12-07 LAB — BASIC METABOLIC PANEL
BUN: 11 mg/dL (ref 7–25)
CO2: 27 mmol/L (ref 20–31)
Calcium: 9.2 mg/dL (ref 8.6–10.4)
Chloride: 101 mmol/L (ref 98–110)
Creat: 0.86 mg/dL (ref 0.50–1.05)
Glucose, Bld: 88 mg/dL (ref 65–99)
Potassium: 4.3 mmol/L (ref 3.5–5.3)
Sodium: 141 mmol/L (ref 135–146)

## 2014-12-10 LAB — CYTOLOGY - PAP

## 2014-12-11 ENCOUNTER — Telehealth: Payer: Self-pay | Admitting: *Deleted

## 2014-12-11 MED ORDER — FLUCONAZOLE 150 MG PO TABS: 150.0000 mg | ORAL_TABLET | Freq: Once | ORAL | Status: DC

## 2014-12-11 NOTE — Telephone Encounter (Signed)
Notified pt and she voices understanding. Lab appt scheduled for 03/18/15 at 9am.  Future lab orders entered. Rx sent.   Notes Recorded by Rosalita Chessman, DO on 12/10/2014 at 7:00 PM + YEAST-- DIFLUCAN 150 MG #2 1 PO QD X1, MAY REPEAT IN 3 DAYS PRN

## 2014-12-11 NOTE — Telephone Encounter (Signed)
-----   Message from Rosalita Chessman, DO sent at 12/09/2014 10:58 PM EDT ----- Cholesterol--- LDL goal < 100,  HDL >40,  TG < 150.  Diet and exercise will increase HDL and decrease LDL and TG.  Fish,  Fish Oil, Flaxseed oil will also help increase the HDL and decrease Triglycerides.   Recheck labs in 3 months Lipid, hep .

## 2014-12-27 ENCOUNTER — Other Ambulatory Visit: Payer: Self-pay | Admitting: Neurosurgery

## 2014-12-27 DIAGNOSIS — M5416 Radiculopathy, lumbar region: Secondary | ICD-10-CM

## 2014-12-31 ENCOUNTER — Telehealth: Payer: Self-pay | Admitting: Family Medicine

## 2014-12-31 NOTE — Telephone Encounter (Signed)
i will write them

## 2014-12-31 NOTE — Telephone Encounter (Signed)
Please advise      KP 

## 2014-12-31 NOTE — Telephone Encounter (Signed)
Pt was getting lamictal, wellbutrin, and xanax thru another provider. That provider is retiring and the other providers will not prescribe as they do not accept pts [facility] insurance plan. Pt is needing to know if Dr. Etter Sjogren will prescribe. Please notify her at [phone].

## 2015-01-01 ENCOUNTER — Ambulatory Visit
Admission: RE | Admit: 2015-01-01 | Discharge: 2015-01-01 | Disposition: A | Discharge status: Home / Self Care | Payer: 59 | Source: Ambulatory Visit | Attending: Neurosurgery | Admitting: Neurosurgery

## 2015-01-01 DIAGNOSIS — M5416 Radiculopathy, lumbar region: Secondary | ICD-10-CM

## 2015-01-01 MED ORDER — IOHEXOL 180 MG/ML  SOLN
1.0000 mL | Freq: Once | INTRAMUSCULAR | Status: DC | PRN
  Administered 2015-01-01: 1 mL via EPIDURAL

## 2015-01-01 MED ORDER — METHYLPREDNISOLONE ACETATE 40 MG/ML INJ SUSP (RADIOLOG
120.0000 mg | Freq: Once | INTRAMUSCULAR | Status: AC
Start: 2015-01-01 — End: 2015-01-01
  Administered 2015-01-01: 120 mg via EPIDURAL

## 2015-01-01 NOTE — Discharge Instructions (Signed)

## 2015-01-02 NOTE — Telephone Encounter (Signed)
Patient is aware that Dr.Lowne will fill the med's for her.     KP

## 2015-01-10 ENCOUNTER — Other Ambulatory Visit: Payer: Self-pay | Admitting: Neurosurgery

## 2015-01-10 DIAGNOSIS — M5416 Radiculopathy, lumbar region: Secondary | ICD-10-CM

## 2015-01-14 ENCOUNTER — Ambulatory Visit
Admission: RE | Admit: 2015-01-14 | Discharge: 2015-01-14 | Disposition: A | Discharge status: Home / Self Care | Payer: 59 | Source: Ambulatory Visit | Attending: Family Medicine | Admitting: Family Medicine

## 2015-01-14 DIAGNOSIS — Z1239 Encounter for other screening for malignant neoplasm of breast: Secondary | ICD-10-CM

## 2015-01-16 ENCOUNTER — Ambulatory Visit
Admission: RE | Admit: 2015-01-16 | Discharge: 2015-01-16 | Disposition: A | Discharge status: Home / Self Care | Payer: 59 | Source: Ambulatory Visit | Attending: Neurosurgery | Admitting: Neurosurgery

## 2015-01-16 DIAGNOSIS — M5416 Radiculopathy, lumbar region: Secondary | ICD-10-CM

## 2015-01-16 MED ORDER — IOHEXOL 180 MG/ML  SOLN
1.0000 mL | Freq: Once | INTRAMUSCULAR | Status: DC | PRN
  Administered 2015-01-16: 1 mL via EPIDURAL

## 2015-01-16 MED ORDER — METHYLPREDNISOLONE ACETATE 40 MG/ML INJ SUSP (RADIOLOG
120.0000 mg | Freq: Once | INTRAMUSCULAR | Status: AC
  Administered 2015-01-16: 120 mg via EPIDURAL

## 2015-01-17 ENCOUNTER — Ambulatory Visit (INDEPENDENT_AMBULATORY_CARE_PROVIDER_SITE_OTHER): Payer: 59 | Admitting: Medical

## 2015-01-17 ENCOUNTER — Encounter: Payer: Self-pay | Admitting: Medical

## 2015-01-17 ENCOUNTER — Telehealth: Payer: Self-pay | Admitting: Family Medicine

## 2015-01-17 VITALS — BP 126/84 | HR 66 | Temp 98.1°F | Resp 16 | Ht 65.0 in | Wt 188.0 lb

## 2015-01-17 DIAGNOSIS — E669 Obesity, unspecified: Secondary | ICD-10-CM | POA: Diagnosis not present

## 2015-01-17 DIAGNOSIS — M5440 Lumbago with sciatica, unspecified side: Secondary | ICD-10-CM

## 2015-01-17 DIAGNOSIS — I10 Essential (primary) hypertension: Secondary | ICD-10-CM | POA: Diagnosis not present

## 2015-01-17 NOTE — Progress Notes (Signed)
Subjective:    Patient ID: Sarah Salazar, female    DOB: May 20, 1955, 59 y.o.   MRN: [id number]  HPI   Pt in for some back pain and desire to loose weight. Although this past month she did loose 6 lbs this past month. This past month she did eat healthy and ate at home. She did loose weight although efforts in past did not help much. Pt is eating some vegetables. Pt is member of gym but specialist think she can't do that due to back. In august her thyroid level was ok.   Pt has been scuba diving and exercising recently.   Pt back pain lower lumbar area. Specialist has given epidural. It helped little but but no much. He recommended she loose weight.   Pt has htn diagnosis. Pt is on metoprolol.      Review of Systems  Constitutional: Negative for fever, chills, diaphoresis, activity change and fatigue.  Respiratory: Negative for cough, chest tightness and shortness of breath.   Cardiovascular: Negative for chest pain, palpitations and leg swelling.  Gastrointestinal: Negative for nausea, vomiting and abdominal pain.  Musculoskeletal: Positive for back pain. Negative for neck pain and neck stiffness.  Neurological: Negative for dizziness, tremors, seizures, syncope, facial asymmetry, speech difficulty, weakness, light-headedness, numbness and headaches.  Hematological: Negative for adenopathy. Does not bruise/bleed easily.  Psychiatric/Behavioral: Negative for behavioral problems, confusion and agitation. The patient is not nervous/anxious.     Past Medical History  Diagnosis Date  . IBS (irritable bowel syndrome)   . Anxiety   . Depression   . Hypothyroidism     hypothyroidism  . Chest pain     Emergency room April 23, 2012, troponin is normal, chest CT scan showed no pulmonary embolus  . Cough     Chronic cough  . Ejection fraction     EF 55-60%, echo, December, 2013, lipomatous hypertrophy of the atrial septum  . GERD (gastroesophageal reflux disease)   . Personal  history of failed moderate sedation 06/20/2012  . Personal history of colonic adenoma 06/28/2012  . Complication of anesthesia     "I'm allergic to versed and fentanyl"  . Family history of anesthesia complication     "hard time waking daughter up post SVT ablation"  . Pneumonia     "couple times" (08/01/2013)  . H/O hiatal hernia   . Migraine     "once or twice" (08/01/2013)  . Bulging lumbar disc     "L3-5" (08/01/2013)  . Bipolar disorder     Social History   Social History  . Marital Status: Divorced    Spouse Name: N/A  . Number of Children: 1  . Years of Education: N/A   Occupational History  .     Social History Main Topics  . Smoking status: Never Smoker   . Smokeless tobacco: Never Used  . Alcohol Use: Yes     Comment: 08/01/2013 "glass of wine 6 times/year at most"  . Drug Use: No  . Sexual Activity: Yes   Other Topics Concern  . 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 Cholecystectomy    . Dilation and curettage of uterus  X 3  . Knee arthroscopy Right X 2  . Wrist surgery  1979-~ 2010    X 4  . Laparoscopy  1970's - 06/03/1990    "several; to evaluate dysfunctional menses & pelvic pain"  . Cystocele repair  01/13/2009  . Esophagogastroduodenoscopy (egd) with esophageal dilation  2004; 2014  . Colonoscopy  2002  . Elbow surgery Left 2005  . Breast lumpectomy Left     "1st breast OR"  . Mastectomy, partial Left     "2nd breast OR", benign fibrocystic changes  . Wisdom tooth extraction      "all 4 at once"    Family History  Problem Relation Age of Onset  . Coronary artery disease Mother   . Stroke Mother     TIA  . Uterine cancer Mother     BREAST  . Lung cancer Father   . Heart disease Father   . Colon cancer Paternal Uncle     COLON; STOMACH  . COPD Father   . Thyroid disease  Mother     goiter  . Lung cancer Maternal Grandfather   . Coronary artery disease Father   . Rheum arthritis Maternal Aunt   . Supraventricular tachycardia Daughter     S/P ablation    Allergies  Allergen Reactions  . Amoxicillin Rash       . Fentanyl Rash    given with Versed  . Midazolam Rash    given with Fentanyl  . Oxycodone-Aspirin Rash  . Sulfonamide Derivatives Rash    rash  . Hydrocodone Hives and Nausea And Vomiting  . Nalbuphine Nausea And Vomiting  . Oxycodone-Acetaminophen Hives and Nausea And Vomiting  . Venlafaxine Other (See Comments)    Patient says makes her crazy  . Verapamil Palpitations    Caused "pounding " in chest    Current Outpatient Prescriptions on File Prior to Visit  Medication Sig Dispense Refill  . albuterol (PROAIR HFA) 108 (90 BASE) MCG/ACT inhaler Inhale 2 puffs into the lungs every 6 (six) hours as needed for wheezing or shortness of breath. 1 Inhaler 2  . ALPRAZolam (XANAX) 1 MG tablet Take 1 tablet by mouth as needed.    . beclomethasone (QVAR) 40 MCG/ACT inhaler Inhale 2 puffs into the lungs 2 (two) times daily. 1 Inhaler 12  . buPROPion (WELLBUTRIN) 100 MG tablet Take 100 mg by mouth at bedtime.    . fluconazole (DIFLUCAN) 150 MG tablet Take 1 tablet (150 mg total) by mouth once. May repeat tablet in 3 days if symptoms not improved. 2 tablet 0  . fluticasone (FLONASE) 50 MCG/ACT nasal spray Place 2 sprays into both nostrils daily. 16 g 6  . hyoscyamine (LEVSIN SL) 0.125 MG SL tablet Place 1 tablet (0.125 mg total) under the tongue every 4 (four) hours as needed. 30 tablet 0  . lamoTRIgine (LAMICTAL) 200 MG tablet Take 1 tablet by mouth daily.    Marland Kitchen levothyroxine (SYNTHROID, LEVOTHROID) 100 MCG tablet Take 1 tablet (100 mcg total) by mouth daily. 90 tablet 1  . metoprolol tartrate (LOPRESSOR) 25 MG tablet Take 0.5 tablets (12.5 mg total) by mouth 2 (two) times daily. 180 tablet 1  . pantoprazole (PROTONIX) 40 MG tablet Take 1 tablet (40  mg total) by mouth daily. 90 tablet 1  . traMADol (ULTRAM) 50 MG tablet Take by mouth every 6 (six) hours as needed.  0   No current facility-administered medications on file prior to visit.    BP 126/84 mmHg  Pulse 66  Temp(Src) 98.1 F (36.7 C) (Oral)  Resp 16  Ht  5\' 5"  (1.651 m)  Wt 188 lb (85.276 kg)  BMI 31.28 kg/m2  SpO2 98%       Objective:   Physical Exam  General Appearance- Not in acute distress.    Chest and Lung Exam Auscultation: Breath sounds:-Normal. Clear even and unlabored. Adventitious sounds:- No Adventitious sounds.  Cardiovascular Auscultation:Rythm - Regular, rate and rythm. Heart Sounds -Normal heart sounds.  Abdomen Inspection:-Inspection Normal.  Palpation/Perucssion: Palpation and Percussion of the abdomen reveal- Non Tender, No Rebound tenderness, No rigidity(Guarding) and No Palpable abdominal masses.  Liver:-Normal.  Spleen:- Normal.   Back Mid lumbar spine tenderness to palpation. Pain on straight leg lift. Pain on lateral movements and flexion/extension of the spine.  Lower ext neurologic  L5-S1 sensation intact bilaterally. Normal patellar reflexes bilaterally. No foot drop bilaterally.    Neurologic Cranial Nerve exam:- CN III-XII intact(No nystagmus), symmetric smile.t Strength:- 5/5 equal and symmetric strength both upper and lower extremities.      Assessment & Plan:  For your obesity, I did review medication belvique and due to your use of various medications if I were to rx that you could get serotonin syndrome. Other agents would likley increase you pulse and your htn(which have been issues in past.) I will try to refer you to dietician. I would recommend light impact exercise if you can tolerate such as walking or elliptical. If you could replicate last month diet maybe you could continue you weight loss.  For back pain continue with neurosurgeon advise/treatment.  For htn and tachycardia hx continue with  metoprolol.  Follow up as regularly scheduled with pcp or as needed with myself.

## 2015-01-17 NOTE — Telephone Encounter (Signed)
Pt says that she forgot to get her work note when she was in this morning. She would like to have it sent to her via mychart please.     Thanks.

## 2015-01-17 NOTE — Telephone Encounter (Signed)
I left a copy of the letter up front. If she has any questions please leave me a message.

## 2015-01-17 NOTE — Progress Notes (Signed)
Pre visit review using our clinic review tool, if applicable. No additional management support is needed unless otherwise documented below in the visit note. 

## 2015-01-17 NOTE — Patient Instructions (Addendum)
For your obesity, I did review medication belvique and due to your use of various medications if I were to rx that you could get serotonin syndrome. Other agents would likley increase you pulse and your htn(which have been issues in past.) I will try to refer you to dietician. I would recommend light impact exercise if you can tolerate such as walking or elliptical. If you could replicate last month diet maybe you could continue you weight loss.  For back pain continue with neurosurgeon advise/treatment.  For htn and tachycardia hx continue with metoprolol.  Follow up as regularly scheduled with pcp or as needed with myself.

## 2015-01-20 ENCOUNTER — Telehealth: Payer: Self-pay | Admitting: Radiology

## 2015-01-20 NOTE — Telephone Encounter (Signed)
Pt called due to more pain associated with this injection. Explained she could alternate ice and heat. Also muscle relaxers might help as well as a good massage.

## 2015-01-21 ENCOUNTER — Ambulatory Visit (HOSPITAL_BASED_OUTPATIENT_CLINIC_OR_DEPARTMENT_OTHER): Payer: 59 | Attending: Pulmonary Disease | Admitting: Radiology

## 2015-01-21 VITALS — Ht 64.0 in | Wt 188.0 lb

## 2015-01-21 DIAGNOSIS — G4733 Obstructive sleep apnea (adult) (pediatric): Secondary | ICD-10-CM | POA: Insufficient documentation

## 2015-01-21 DIAGNOSIS — G4730 Sleep apnea, unspecified: Secondary | ICD-10-CM | POA: Diagnosis present

## 2015-01-21 NOTE — Telephone Encounter (Signed)
Mailed out letter to pt for the doctors note.

## 2015-01-23 ENCOUNTER — Telehealth: Payer: Self-pay | Admitting: Pulmonary Disease

## 2015-01-23 DIAGNOSIS — G4733 Obstructive sleep apnea (adult) (pediatric): Secondary | ICD-10-CM | POA: Diagnosis not present

## 2015-01-23 NOTE — Telephone Encounter (Signed)
lmtcb

## 2015-01-23 NOTE — Telephone Encounter (Signed)
Send Rx for  - CPAP 8 cm H2O with a Standard size Fisher[employer]Paykel Nasal Pillow Mask [place] Q mask and heated humidification. Download in 4 wks OV with TP in 6 wks - HP

## 2015-01-23 NOTE — Progress Notes (Signed)
Patient Name: Sarah Salazar, Sarah Salazar Date: 01/21/2015 Gender: Female D.O.B: [birth year] Age (years): 74 Referring Provider: Kara Mead MD, ABSM Height (inches): 64 Interpreting Physician: Kara Mead MD, ABSM Weight (lbs): 188 RPSGT: Zadie Rhine BMI: 32 MRN: [id number] Neck Size: 15.00  CLINICAL INFORMATION The patient is referred for a CPAP titration to treat sleep apnea.  Date of HST: 10/2014 , mild OSA, 11/h  SLEEP STUDY TECHNIQUE As per the AASM Manual for the Scoring of Sleep and Associated Events v2.3 (April 2016) with a hypopnea requiring 4% desaturations.  The channels recorded and monitored were frontal, central and occipital EEG, electrooculogram (EOG), submentalis EMG (chin), nasal and oral airflow, thoracic and abdominal wall motion, anterior tibialis EMG, snore microphone, electrocardiogram, and pulse oximetry. Continuous positive airway pressure (CPAP) was initiated at the beginning of the study and titrated to treat sleep-disordered breathing.  MEDICATIONS Medications administered by patient during sleep study : No sleep medicine administered.  TECHNICIAN COMMENTS Comments added by technician: NO MEDICATION WAS TAKEN  Comments added by scorer: N/A  RESPIRATORY PARAMETERS Optimal PAP Pressure (cm): 8 AHI at Optimal Pressure (/hr): 0.0 Overall Minimal O2 (%): 92.00 Supine % at Optimal Pressure (%): 0 Minimal O2 at Optimal Pressure (%): 93.0    SLEEP ARCHITECTURE The study was initiated at 9:52:01 PM and ended at 4:30:46 AM.  Sleep onset time was 27.4 minutes and the sleep efficiency was 70.2%. The total sleep time was 280.0 minutes.  The patient spent 6.79% of the night in stage N1 sleep, 65.18% in stage N2 sleep, 1.61% in stage N3 and 26.43% in REM.Stage REM latency was 185.0 minutes  Wake after sleep onset was 91.3. Alpha intrusion was absent. Supine sleep was 0.00%.  CARDIAC DATA The 2 lead EKG demonstrated sinus rhythm. The mean heart rate was 53.23 beats  per minute. Other EKG findings include: None.  LEG MOVEMENT DATA The total Periodic Limb Movements of Sleep (PLMS) were 0. The PLMS index was 0.00. A PLMS index of <15 is considered normal in adults.  IMPRESSIONS The optimal PAP pressure was 8 cm of water. Central sleep apnea was not noted during this titration (CAI = 0.0/h). Significant oxygen desaturations were not observed during this titration (min O2 = 92.00%). No snoring was audible during this study. No cardiac abnormalities were observed during this study. Clinically significant periodic limb movements were not noted during this study. Arousals associated with PLMs were rare.  DIAGNOSIS Obstructive Sleep Apnea (327.23 [G47.33 ICD-10])  RECOMMENDATIONS Trial of CPAP therapy on 8 cm H2O with a Standard size Fisher&Paykel Nasal Pillow Mask [place] Q mask and heated humidification. Avoid alcohol, sedatives and other CNS depressants that may worsen sleep apnea and disrupt normal sleep architecture. Sleep hygiene should be reviewed to assess factors that may improve sleep quality. Weight management and regular exercise should be initiated or continued. Return for re-evaluation after 4 weeks of therapy  Rigoberto Noel. MD

## 2015-01-23 NOTE — Addendum Note (Signed)
Addended by: Rigoberto Noel on: 01/23/2015 08:25 AM   Modules accepted: Level of Service

## 2015-01-24 NOTE — Telephone Encounter (Signed)
[phone] calling back

## 2015-01-24 NOTE — Telephone Encounter (Signed)
Called pt and is aware of below. Order placed. appt scheduled with TP in [place] per pt request. Nothing further needed

## 2015-01-30 ENCOUNTER — Other Ambulatory Visit: Payer: Self-pay | Admitting: Family Medicine

## 2015-02-06 ENCOUNTER — Other Ambulatory Visit: Payer: Self-pay | Admitting: Family Medicine

## 2015-02-14 ENCOUNTER — Encounter: Payer: 59 | Attending: Family Medicine | Admitting: Dietician

## 2015-02-14 ENCOUNTER — Encounter: Payer: Self-pay | Admitting: Dietician

## 2015-02-14 ENCOUNTER — Telehealth: Payer: Self-pay | Admitting: Family Medicine

## 2015-02-14 VITALS — Ht 65.0 in | Wt 188.0 lb

## 2015-02-14 DIAGNOSIS — Z713 Dietary counseling and surveillance: Secondary | ICD-10-CM | POA: Diagnosis not present

## 2015-02-14 DIAGNOSIS — E669 Obesity, unspecified: Secondary | ICD-10-CM | POA: Insufficient documentation

## 2015-02-14 DIAGNOSIS — Z6831 Body mass index (BMI) 31.0-31.9, adult: Secondary | ICD-10-CM | POA: Insufficient documentation

## 2015-02-14 DIAGNOSIS — E039 Hypothyroidism, unspecified: Secondary | ICD-10-CM

## 2015-02-14 NOTE — Telephone Encounter (Signed)
Ref placed.      KP 

## 2015-02-14 NOTE — Telephone Encounter (Signed)
Patient wants to go due to thyroid and difficulty losing weight. Please advise     KP

## 2015-02-14 NOTE — Telephone Encounter (Signed)
Ok to refer to endo ---hypothyroid

## 2015-02-14 NOTE — Telephone Encounter (Signed)
Relation to MI:WOEH Call back number: [phone]   Reason for call:  Patient requesting a referral to [facility]

## 2015-02-14 NOTE — Patient Instructions (Signed)
Be as active as possible. Consider water aerobics, walking, consider seeing a Physical therapist.  Slow and steady, increase as tolerated.   Listen to your body.  When are you full? Breakfast, lunch, dinner daily. Be mindful of your beverages.  It is best to avoid soda.  Drink more water.   Have small amount of protein each time you eat. Choose whole grains. Continue to cook!  The less processed the better.

## 2015-02-14 NOTE — Progress Notes (Signed)
  Medical Nutrition Therapy:  Appt start time: 1200 end time:  1330.   Assessment:  Primary concerns today: Patient is her alone.  She would like to learn about healthy weight loss.  Usual weight until about 59 yo was 105 lbs.  She increased to 145 lbs in her early 40's then increased to >200 lbs secondary to hypothyroid.  She has since lost to current weight of 188 lbs.  Her goal is 145-155 lbs.  She lives with her boyfriend.  Both share shopping but she does the majority of the cooking.  She reports eating small portions of meat, starches, sweets.  She works from home and activity currently has been limited due to back problems and lack of motivation.  Chol:  197, Triglyceride 189, HDL 37, LDL 122.  Her HgbA1C was 5.6 08/01/13.  It was up to 5.9 in 2009.   She brought many recipes that she enjoys cooking.    Preferred Learning Style:   Auditory  Visual  Hands on  No preference indicated   Learning Readiness:   Ready  MEDICATIONS:  See list   DIETARY INTAKE:  Seasons with a lot of Mrs. Dash. Does not eat much bread  24-hr recall:  B ( AM): scrambled eggs OJ, pepsi or Cereal and 2% milk  Snk ( AM): unsalted almonds  L ( PM): leftovers OR chicken noodle soup Snk ( PM): none D ( PM): Pasta or sauerkraut and kielbasa and baked beans, OR hot dogs and baked beans, or baked chicken and asparagus, Hamburger helper stroganoff Snk ( PM): none Beverages: 2-16 oz pepsi per day, sweet tea (1-2 tsp sugar per serving), OJ occasionally, 1 glass water/day, coffee rarely, 2% milk  Usual physical activity: none  Estimated energy needs: 1500 calories 1700 g carbohydrates 94 g protein 50 g fat  Progress Towards Goal(s):  In progress.   Nutritional Diagnosis:  NB-1.1 Food and nutrition-related knowledge deficit As related to weight loss.  As evidenced by patient report.    Intervention:  Nutrition counseling/education regarding intuitive eating for weight management.  Discussed the benefit  of exercise, fewer processed foods and whole grains.    Be as active as possible. Consider water aerobics, walking, consider seeing a Physical therapist.  Slow and steady, increase as tolerated.   Listen to your body.  When are you full? Breakfast, lunch, dinner daily. Be mindful of your beverages.  It is best to avoid soda.  Drink more water.   Have small amount of protein each time you eat. Choose whole grains. Continue to cook!  The less processed the better.    Teaching Method Utilized:  Visual Auditory Hands on  Handouts given during visit include:  Weight management resources  Yellow meal card  My plate  Barriers to learning/adherence to lifestyle change: none  Demonstrated degree of understanding via:  Teach Back   Monitoring/Evaluation:  Dietary intake, exercise, and body weight prn.

## 2015-02-17 ENCOUNTER — Encounter: Payer: Self-pay | Admitting: Dietician

## 2015-03-07 ENCOUNTER — Telehealth: Payer: Self-pay | Admitting: Endocrinology

## 2015-03-07 ENCOUNTER — Encounter: Payer: Self-pay | Admitting: Endocrinology

## 2015-03-07 ENCOUNTER — Ambulatory Visit (INDEPENDENT_AMBULATORY_CARE_PROVIDER_SITE_OTHER): Payer: 59 | Admitting: Endocrinology

## 2015-03-07 VITALS — BP 150/84 | HR 75 | Temp 98.5°F | Resp 16 | Ht 65.0 in | Wt 188.4 lb

## 2015-03-07 DIAGNOSIS — R5382 Chronic fatigue, unspecified: Secondary | ICD-10-CM | POA: Diagnosis not present

## 2015-03-07 DIAGNOSIS — E038 Other specified hypothyroidism: Secondary | ICD-10-CM | POA: Diagnosis not present

## 2015-03-07 DIAGNOSIS — E063 Autoimmune thyroiditis: Secondary | ICD-10-CM

## 2015-03-07 LAB — TSH: TSH: 0.36 u[IU]/mL (ref 0.35–4.50)

## 2015-03-07 LAB — T4, FREE: Free T4: 1.26 ng/dL (ref 0.60–1.60)

## 2015-03-07 LAB — T3, FREE: T3, Free: 2.9 pg/mL (ref 2.3–4.2)

## 2015-03-07 NOTE — Progress Notes (Signed)
Patient ID: Sarah Salazar, female   DOB: 1956-01-22, 59 y.o.   MRN: [id number]            Reason for Appointment:  Hypothyroidism, new visit    History of Present Illness:   The Hypothyroidism was first diagnosed in the year 2000  No records are available from the time of her initial diagnosis but patient reports that she had gained a very significant amount of weight over about 2 months and was having some problems with fatigue and increased sweating. She was told that her thyroid level was borderline only but started her on thyroid supplement She thinks that with taking the thyroid supplement her weight went down to normal and she started feeling better with her energy  Her thyroid regimen has been about the same for several years and prescription history indicates the same dose since 2012 of generic levothyroxine, 100 g. She takes her thyroid supplement regularly in the mornings Her TSH levels have been consistently normal since 2007 except low in 6/16 but no change in medication done at that time  For the last 2 years or so she has been complaining about significant fatigue and sleepiness She also has had some difficulty with progressive weight gain has occurred gradually over the last few years; her weight appears to be the same this year according to her medical record  She does not complain of any cold sensitivity; she does tend to be more hot especially at night She does not have any symptoms of dry skin or hair loss She is now referred here for management of her symptoms and endocrine evaluation     Lab Results  Component Value Date   TSH 1.294 12/06/2014   TSH 0.25* 10/22/2014   TSH 0.46 10/09/2014   FREET4 0.97 10/22/2014   FREET4 1.21 08/01/2013   FREET4 0.8 05/20/2009       Past Medical History  Diagnosis Date  . IBS (irritable bowel syndrome)   . Anxiety   . Depression   . Hypothyroidism     hypothyroidism  . Chest pain     Emergency room April 23, 2012, troponin is normal, chest CT scan showed no pulmonary embolus  . Cough     Chronic cough  . Ejection fraction     EF 55-60%, echo, December, 2013, lipomatous hypertrophy of the atrial septum  . GERD (gastroesophageal reflux disease)   . Personal history of failed moderate sedation 06/20/2012  . Personal history of colonic adenoma 06/28/2012  . Complication of anesthesia     "I'm allergic to versed and fentanyl"  . Family history of anesthesia complication     "hard time waking daughter up post SVT ablation"  . Pneumonia     "couple times" (08/01/2013)  . H/O hiatal hernia   . Migraine     "once or twice" (08/01/2013)  . Bulging lumbar disc     "L3-5" (08/01/2013)  . Bipolar disorder [facility])     Past Surgical History  Procedure Laterality Date  . Cholecystectomy    . Dilation and curettage of uterus  X 3  . Knee arthroscopy Right X 2  . Wrist surgery  1979-~ 2010    X 4  . Laparoscopy  1970's - 06/03/1990    "several; to evaluate dysfunctional menses & pelvic pain"  . Cystocele repair  01/13/2009  . Esophagogastroduodenoscopy (egd) with esophageal dilation  2004; 2014  . Colonoscopy  2002  . Elbow surgery Left 2005  . Breast  lumpectomy Left     "1st breast OR"  . Mastectomy, partial Left     "2nd breast OR", benign fibrocystic changes  . Wisdom tooth extraction      "all 4 at once"    Family History  Problem Relation Age of Onset  . Coronary artery disease Mother   . Stroke Mother     TIA  . Uterine cancer Mother     BREAST  . Thyroid disease Mother     goiter  . Lung cancer Father   . Heart disease Father   . COPD Father   . Coronary artery disease Father   . Colon cancer Paternal Uncle     COLON; STOMACH  . Lung cancer Maternal Grandfather   . Rheum arthritis Maternal Aunt   . Supraventricular tachycardia Daughter     S/P ablation  . Thyroid disease Cousin     Social History:  reports that she has never smoked. She has never used smokeless tobacco. She  reports that she drinks alcohol. She reports that she does not use illicit drugs.  Allergies:  Allergies  Allergen Reactions  . Amoxicillin Rash       . Fentanyl Rash    given with Versed  . Midazolam Rash    given with Fentanyl  . Oxycodone-Aspirin Rash  . Sulfonamide Derivatives Rash    rash  . Hydrocodone Hives and Nausea And Vomiting  . Nalbuphine Nausea And Vomiting  . Oxycodone-Acetaminophen Hives and Nausea And Vomiting  . Venlafaxine Other (See Comments)    Patient says makes her crazy  . Verapamil Palpitations    Caused "pounding " in chest      Medication List       This list is accurate as of: 03/07/15 12:44 PM.  Always use your most recent med list.               albuterol 108 (90 BASE) MCG/ACT inhaler  Commonly known as:  PROAIR HFA  Inhale 2 puffs into the lungs every 6 (six) hours as needed for wheezing or shortness of breath.     ALPRAZolam 1 MG tablet  Commonly known as:  XANAX  Take 1 tablet by mouth as needed.     beclomethasone 40 MCG/ACT inhaler  Commonly known as:  QVAR  Inhale 2 puffs into the lungs 2 (two) times daily.     buPROPion 100 MG tablet  Commonly known as:  WELLBUTRIN  Take 100 mg by mouth at bedtime.     fluconazole 150 MG tablet  Commonly known as:  DIFLUCAN  Take 1 tablet (150 mg total) by mouth once. May repeat tablet in 3 days if symptoms not improved.     fluticasone 50 MCG/ACT nasal spray  Commonly known as:  FLONASE  Place 2 sprays into both nostrils daily.     hyoscyamine 0.125 MG SL tablet  Commonly known as:  LEVSIN SL  Place 1 tablet (0.125 mg total) under the tongue every 4 (four) hours as needed.     lamoTRIgine 200 MG tablet  Commonly known as:  LAMICTAL  Take 1 tablet by mouth daily.     levothyroxine 100 MCG tablet  Commonly known as:  SYNTHROID, LEVOTHROID  TAKE 1 TABLET DAILY     metoprolol tartrate 25 MG tablet  Commonly known as:  LOPRESSOR  Take 0.5 tablets (12.5 mg total) by mouth 2 (two)  times daily.     pantoprazole 40 MG tablet  Commonly known as:  PROTONIX  TAKE 1 TABLET DAILY     traMADol 50 MG tablet  Commonly known as:  ULTRAM  Take by mouth every 6 (six) hours as needed.         Review of Systems  Constitutional: Positive for malaise/fatigue.  Respiratory: Negative for shortness of breath.        She has been diagnosed with sleep apnea and for the last 6 weeks or so has started CPAP but does not feel any less sleepy now.  Has not scheduled follow-up with pulmonologist  Cardiovascular: Negative for leg swelling.  Gastrointestinal: Negative for constipation.  Musculoskeletal: Positive for back pain.  Skin: Positive for itching.  Neurological: Negative for dizziness and headaches.  Endo/Heme/Allergies: Bruises/bleeds easily.  Psychiatric/Behavioral: Positive for depression.       She is having more stress recently.  She feels she does not care about anything.  She may cry easily at times     Examination:    BP 150/84 mmHg  Pulse 75  Temp(Src) 98.5 F (36.9 C)  Resp 16  Ht 5\' 5"  (1.651 m)  Wt 188 lb 6.4 oz (85.458 kg)  BMI 31.35 kg/m2  SpO2 97%  GENERAL:  Average build.  Mild generalized obesity present.  No cushingoid features of central obesity, facial plethora or purplish striae  No pallor, clubbing, lymphadenopathy or edema.    Skin:  no rash or pigmentation.  Mild facial hair present on the upper lip  EYES:  No prominence of the eyes or swelling of the eyelids  ENT: Oral mucosa and tongue normal.  THYROID:  Not palpable.  HEART:  Normal  S1 and S2; no murmur or click.  CHEST:    Lungs: Vescicular breath sounds heard equally.  No crepitations/ wheeze.  ABDOMEN:  No distention.  Liver and spleen not palpable.  No other mass or tenderness.  NEUROLOGICAL: Reflexes are bilaterally normal at biceps.  JOINTS:  Normal.   Assessments   1. Hypothyroidism by history.  Not clear if she had documented high TSH level at baseline but she  reports improvement in symptoms with taking levothyroxine initially She has multiple nonspecific symptoms currently, mostly related to excessive sleepiness, fatigue and depression.  Her TSH has been consistently normal with taking 100 g of levothyroxine Although it is unlikely that her symptoms are related to hypothyroidism she may benefit from combination of levothyroxine and T3 in a preparation like Armour thyroid  2.  Fatigue, sleepiness and depression.  This is likely to be accommodation from inadequately treated depression and she has not had any follow-up with her treatment for sleep apnea  3.  Obesity.  She does not appear to have any cushingoid features.  Her weight has been stable this year and reassured her that she does not have any other endocrine issues that would cause her lack of weight loss.  She is currently not exercising and has significant problems with her depression and sleep apnea to enable any weight loss  PLAN:     Check thyroid levels again today including T3  Consider Armour thyroid especially if T3 is relatively low  She needs to discuss adjusting her Wellbutrin dose because of her depression  She will need to follow up with her pulmonologist for sleep apnea  Lifestyle changes are needed to improve her weight    Khaled Herda 03/07/2015, 12:44 PM

## 2015-03-09 NOTE — Progress Notes (Signed)
Quick Note:  Please let patient know that labs showed a relatively lower T3 level, to switch levothyroxine to Armour thyroid 90 mg, take 1 tablet 5 days a week and half tablet twice a week such as Sunday and Wednesday. Keep appointment next month but need also labs done ahead of time if possible ______

## 2015-03-12 ENCOUNTER — Other Ambulatory Visit: Payer: Self-pay | Admitting: *Deleted

## 2015-03-12 MED ORDER — THYROID 90 MG PO TABS: ORAL_TABLET | ORAL | Status: DC

## 2015-03-12 NOTE — Telephone Encounter (Signed)
Pt is requesting we send in a new med for her thyroid please call [facility] the [facility] [place]

## 2015-03-13 ENCOUNTER — Ambulatory Visit (INDEPENDENT_AMBULATORY_CARE_PROVIDER_SITE_OTHER): Payer: 59 | Admitting: Family Medicine

## 2015-03-13 ENCOUNTER — Telehealth: Payer: Self-pay

## 2015-03-13 ENCOUNTER — Other Ambulatory Visit: Payer: Self-pay | Admitting: *Deleted

## 2015-03-13 ENCOUNTER — Encounter: Payer: Self-pay | Admitting: Family Medicine

## 2015-03-13 VITALS — BP 161/93 | HR 73 | Temp 98.6°F | Wt 192.2 lb

## 2015-03-13 DIAGNOSIS — M5442 Lumbago with sciatica, left side: Secondary | ICD-10-CM | POA: Diagnosis not present

## 2015-03-13 MED ORDER — METAXALONE 800 MG PO TABS: 800.0000 mg | ORAL_TABLET | Freq: Three times a day (TID) | ORAL | Status: DC

## 2015-03-13 MED ORDER — THYROID 90 MG PO TABS: ORAL_TABLET | ORAL | Status: DC

## 2015-03-13 MED ORDER — TRAMADOL HCL 50 MG PO TABS: 50.0000 mg | ORAL_TABLET | Freq: Four times a day (QID) | ORAL | Status: DC | PRN

## 2015-03-13 NOTE — Telephone Encounter (Signed)
-----   Message from Rosalita Chessman, DO sent at 03/13/2015  3:08 PM EST ----- Cancel skelaxin and ultram at [facility]

## 2015-03-13 NOTE — Patient Instructions (Signed)

## 2015-03-13 NOTE — Progress Notes (Signed)
Pre visit review using our clinic review tool, if applicable. No additional management support is needed unless otherwise documented below in the visit note. 

## 2015-03-13 NOTE — Telephone Encounter (Signed)
[facility]  

## 2015-03-13 NOTE — Telephone Encounter (Signed)
Vm left to cancel the Skelaxin RX.      KP

## 2015-03-13 NOTE — Progress Notes (Signed)
Patient ID: Sarah Salazar, female    DOB: 09-18-1955  Age: 59 y.o. MRN: [id number]    Subjective:  Subjective HPI Sarah Salazar presents for back pain-- she was released from neurosurgeon back to [place].    Review of Systems  Constitutional: Negative for diaphoresis, appetite change, fatigue and unexpected weight change.  Eyes: Negative for pain, redness and visual disturbance.  Respiratory: Negative for cough, chest tightness, shortness of breath and wheezing.   Cardiovascular: Negative for chest pain, palpitations and leg swelling.  Endocrine: Negative for cold intolerance, heat intolerance, polydipsia, polyphagia and polyuria.  Genitourinary: Negative for dysuria, frequency and difficulty urinating.  Musculoskeletal: Positive for back pain. Negative for myalgias, joint swelling and gait problem.  Neurological: Positive for weakness and numbness. Negative for dizziness, light-headedness and headaches.    History Past Medical History  Diagnosis Date  . IBS (irritable bowel syndrome)   . Anxiety   . Depression   . Hypothyroidism     hypothyroidism  . Chest pain     Emergency room April 23, 2012, troponin is normal, chest CT scan showed no pulmonary embolus  . Cough     Chronic cough  . Ejection fraction     EF 55-60%, echo, December, 2013, lipomatous hypertrophy of the atrial septum  . GERD (gastroesophageal reflux disease)   . Personal history of failed moderate sedation 06/20/2012  . Personal history of colonic adenoma 06/28/2012  . Complication of anesthesia     "I'm allergic to versed and fentanyl"  . Family history of anesthesia complication     "hard time waking daughter up post SVT ablation"  . Pneumonia     "couple times" (08/01/2013)  . H/O hiatal hernia   . Migraine     "once or twice" (08/01/2013)  . Bulging lumbar disc     "L3-5" (08/01/2013)  . Bipolar disorder ([place])     She has past surgical history that includes Cholecystectomy; Dilation and curettage of  uterus (X 3); Knee arthroscopy (Right, X 2); Wrist surgery (1979-~ 2010); laparoscopy [id number] - 06/03/1990); Cystocele repair (01/13/2009); Esophagogastroduodenoscopy (egd) with esophageal dilation (2004; 2014); Colonoscopy (2002); Elbow surgery (Left, 2005); Breast lumpectomy (Left); Mastectomy, partial (Left); and Wisdom tooth extraction.   Her family history includes COPD in her father; Colon cancer in her paternal uncle; Coronary artery disease in her father and mother; Heart disease in her father; Lung cancer in her father and maternal grandfather; Rheum arthritis in her maternal aunt; Stroke in her mother; Supraventricular tachycardia in her daughter; Thyroid disease in her cousin and mother; Uterine cancer in her mother.She reports that she has never smoked. She has never used smokeless tobacco. She reports that she drinks alcohol. She reports that she does not use illicit drugs.  Current Outpatient Prescriptions on File Prior to Visit  Medication Sig Dispense Refill  . albuterol (PROAIR HFA) 108 (90 BASE) MCG/ACT inhaler Inhale 2 puffs into the lungs every 6 (six) hours as needed for wheezing or shortness of breath. 1 Inhaler 2  . ALPRAZolam (XANAX) 1 MG tablet Take 1 tablet by mouth as needed.    . beclomethasone (QVAR) 40 MCG/ACT inhaler Inhale 2 puffs into the lungs 2 (two) times daily. 1 Inhaler 12  . buPROPion (WELLBUTRIN) 100 MG tablet Take 100 mg by mouth at bedtime.    . hyoscyamine (LEVSIN SL) 0.125 MG SL tablet Place 1 tablet (0.125 mg total) under the tongue every 4 (four) hours as needed. 30 tablet 0  . lamoTRIgine (LAMICTAL)  200 MG tablet Take 1 tablet by mouth daily.    . metoprolol tartrate (LOPRESSOR) 25 MG tablet Take 0.5 tablets (12.5 mg total) by mouth 2 (two) times daily. 180 tablet 1  . pantoprazole (PROTONIX) 40 MG tablet TAKE 1 TABLET DAILY 90 tablet 0  . levothyroxine (SYNTHROID, LEVOTHROID) 100 MCG tablet TAKE 1 TABLET DAILY (Patient not taking: Reported on 03/13/2015) 90  tablet 1   No current facility-administered medications on file prior to visit.     Objective:  Objective Physical Exam  Constitutional: She is oriented to person, place, and time. She appears well-developed and well-nourished.  HENT:  Head: Normocephalic and atraumatic.  Eyes: Conjunctivae and EOM are normal.  Neck: Normal range of motion. Neck supple. No JVD present. Carotid bruit is not present. No thyromegaly present.  Cardiovascular: Normal rate, regular rhythm and normal heart sounds.   No murmur heard. Pulmonary/Chest: Effort normal and breath sounds normal. No respiratory distress. She has no wheezes. She has no rales. She exhibits no tenderness.  Musculoskeletal: Normal range of motion. She exhibits tenderness. She exhibits no edema.       Lumbar back: She exhibits tenderness, pain and spasm. She exhibits normal range of motion.  Neurological: She is alert and oriented to person, place, and time. She displays normal reflexes. No cranial nerve deficit. She exhibits normal muscle tone. Coordination normal.  Psychiatric: She has a normal mood and affect. Her behavior is normal.  Nursing note and vitals reviewed.  BP 161/93 mmHg  Pulse 73  Temp(Src) 98.6 F (37 C) (Oral)  Wt 192 lb 3.2 oz (87.181 kg)  SpO2 98% Wt Readings from Last 3 Encounters:  03/13/15 192 lb 3.2 oz (87.181 kg)  03/07/15 188 lb 6.4 oz (85.458 kg)  02/14/15 188 lb (85.276 kg)     Lab Results  Component Value Date   WBC 7.4 12/06/2014   HGB 13.8 12/06/2014   HCT 41.5 12/06/2014   PLT 212 12/06/2014   GLUCOSE 88 12/06/2014   CHOL 197 12/06/2014   TRIG 189* 12/06/2014   HDL 37* 12/06/2014   LDLDIRECT 126.5 10/12/2006   LDLCALC 122 12/06/2014   ALT 23 12/06/2014   AST 20 12/06/2014   NA 141 12/06/2014   K 4.3 12/06/2014   CL 101 12/06/2014   CREATININE 0.86 12/06/2014   BUN 11 12/06/2014   CO2 27 12/06/2014   TSH 0.36 03/07/2015   INR 1.05 08/01/2013   HGBA1C 5.6 08/01/2013    Dg  Epidurography  01/16/2015  CLINICAL DATA:  Lumbosacral spondylosis without myelopathy. Lumbar radiculopathy. Subsequent encounter. Normal appearing MRI for age. There is a LEFT lateral protrusion at L2-L3 that potentially affects the LEFT L2 or L3 nerves. LEFT radiculopathy extending to the knee. No relief with last interlaminar epidural steroid injection. In fact, last injection produced exacerbation of symptoms in the short term. EXAM: LUMBAR INTERLAMINAR EPIDURAL INJECTION FLUOROSCOPY TIME:  0 minutes 27 seconds Dose area product: 50.82 microGy*m^2 PROCEDURE: Procedure: After a thorough discussion of risks and benefits of the procedure, written and verbal consent was obtained. Specific risks included puncture of the thecal sac and dura as well as nontherapeutic injection with general risks of bleeding, infection, injury to nerves, blood vessels, and adjacent structures. Time out form was completed. Verbal consent was obtained by Dr. Gerilyn Pilgrim. We discussed the moderate likelihood of moderate lasting relief/attainment of therapeutic goal. The overlying skin was cleansed with betadine soap and anesthetized with 1% lidocaine without epinephrine. An interlaminar approach was performed at  L2-L3 on the LEFT. 3.5 inch 20 gauge needle was advanced using loss-of-resistance technique. DIAGNOSTIC/THERAPUETIC EPIDURAL INJECTION: Injection of Omnipaque 180 shows a good epidural pattern with spread above and below the level of needle placement, primarily on the side of needle placement. No vascular or subarachnoid opacification was seen. 120 mg of Depo-Medrol mixed with 5 cc of 1% Lidocaine were instilled. The procedure was well-tolerated, and the patient was discharged thirty minutes following the injection in good condition. IMPRESSION: Technically successful lumbar interlaminar epidural injection at LEFT L2-L3. I also recommended weight loss and a physical therapy visit to assist in controlling the lumbago/low back pain.  Electronically Signed   By: Dereck Ligas M.D.   On: 01/16/2015 08:27     Assessment & Plan:  Plan I have discontinued Ms. Kretchmer's fluticasone and fluconazole. I have also changed her traMADol. Additionally, I am having her start on metaxalone. Lastly, I am having her maintain her buPROPion, hyoscyamine, albuterol, beclomethasone, metoprolol tartrate, ALPRAZolam, lamoTRIgine, levothyroxine, pantoprazole, and thyroid.  Meds ordered this encounter  Medications  . metaxalone (SKELAXIN) 800 MG tablet    Sig: Take 1 tablet (800 mg total) by mouth 3 (three) times daily.    Dispense:  30 tablet    Refill:  0  . traMADol (ULTRAM) 50 MG tablet    Sig: Take 1 tablet (50 mg total) by mouth every 6 (six) hours as needed.    Dispense:  30 tablet    Refill:  0    Problem List Items Addressed This Visit    None    Visit Diagnoses    Left-sided low back pain with left-sided sciatica    -  Primary    Relevant Medications    metaxalone (SKELAXIN) 800 MG tablet    traMADol (ULTRAM) 50 MG tablet    Other Relevant Orders    Ambulatory referral to Physical Therapy       Follow-up: Return if symptoms worsen or fail to improve.  Garnet Koyanagi, DO

## 2015-03-14 ENCOUNTER — Telehealth: Payer: Self-pay | Admitting: Family Medicine

## 2015-03-14 NOTE — Telephone Encounter (Signed)
Pt called in stating that she was hurting last night and took 1/2 vicodin. She felt ok this morning and took her BP and pulse 9:30am (148/94 & 71). At that time she took 1/2 metoprolol.  Pt states she was getting a headache and took her BP and pulse again 2:40pm (143/98 & 61). She is calling to see what she should do as she was advised to monitor it. Ph# [phone].

## 2015-03-14 NOTE — Telephone Encounter (Signed)
Please advise      KP 

## 2015-03-14 NOTE — Telephone Encounter (Signed)
Patient aware, she is scheduled for 10:30 tomorrow.     KP

## 2015-03-14 NOTE — Telephone Encounter (Signed)
Pt may need to go to sat clinic

## 2015-03-15 ENCOUNTER — Encounter: Payer: Self-pay | Admitting: Internal Medicine

## 2015-03-15 ENCOUNTER — Ambulatory Visit (INDEPENDENT_AMBULATORY_CARE_PROVIDER_SITE_OTHER): Payer: 59 | Admitting: Internal Medicine

## 2015-03-15 VITALS — BP 148/88 | HR 79 | Temp 98.2°F | Resp 14 | Ht 65.0 in | Wt 193.0 lb

## 2015-03-15 DIAGNOSIS — R5382 Chronic fatigue, unspecified: Secondary | ICD-10-CM | POA: Diagnosis not present

## 2015-03-15 DIAGNOSIS — R030 Elevated blood-pressure reading, without diagnosis of hypertension: Secondary | ICD-10-CM | POA: Diagnosis not present

## 2015-03-15 DIAGNOSIS — I10 Essential (primary) hypertension: Secondary | ICD-10-CM | POA: Diagnosis not present

## 2015-03-15 DIAGNOSIS — M549 Dorsalgia, unspecified: Secondary | ICD-10-CM

## 2015-03-15 DIAGNOSIS — G8929 Other chronic pain: Secondary | ICD-10-CM

## 2015-03-15 DIAGNOSIS — IMO0001 Reserved for inherently not codable concepts without codable children: Secondary | ICD-10-CM
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
## 2015-03-15 NOTE — Patient Instructions (Signed)
Take blood pressure readings twice a day for10 - 14 days        See dr Etter Sjogren in  2 weeks or as needed    Goal  Is below 140/90 generally. In the interim increase the metoprolol to  25 mg   Twice a day    ( 1 whole pill twice a day) .    DASH Eating Plan DASH stands for "Dietary Approaches to Stop Hypertension." The DASH eating plan is a healthy eating plan that has been shown to reduce high blood pressure (hypertension). Additional health benefits may include reducing the risk of type 2 diabetes mellitus, heart disease, and stroke. The DASH eating plan may also help with weight loss. WHAT DO I NEED TO KNOW ABOUT THE DASH EATING PLAN? For the DASH eating plan, you will follow these general guidelines:  Choose foods with a percent daily value for sodium of less than 5% (as listed on the food label).  Use salt-free seasonings or herbs instead of table salt or sea salt.  Check with your health care provider or pharmacist before using salt substitutes.  Eat lower-sodium products, often labeled as "lower sodium" or "no salt added."  Eat fresh foods.  Eat more vegetables, fruits, and low-fat dairy products.  Choose whole grains. Look for the word "whole" as the first word in the ingredient list.  Choose fish and skinless chicken or [place] more often than red meat. Limit fish, poultry, and meat to 6 oz (170 g) each day.  Limit sweets, desserts, sugars, and sugary drinks.  Choose heart-healthy fats.  Limit cheese to 1 oz (28 g) per day.  Eat more home-cooked food and less restaurant, buffet, and fast food.  Limit fried foods.  Cook foods using methods other than frying.  Limit canned vegetables. If you do use them, rinse them well to decrease the sodium.  When eating at a restaurant, ask that your food be prepared with less salt, or no salt if possible. WHAT FOODS CAN I EAT? Seek help from a dietitian for individual calorie needs. Grains Whole grain or whole wheat bread. Brown  rice. Whole grain or whole wheat pasta. Quinoa, bulgur, and whole grain cereals. Low-sodium cereals. Corn or whole wheat flour tortillas. Whole grain cornbread. Whole grain crackers. Low-sodium crackers. Vegetables Fresh or frozen vegetables (raw, steamed, roasted, or grilled). Low-sodium or reduced-sodium tomato and vegetable juices. Low-sodium or reduced-sodium tomato sauce and paste. Low-sodium or reduced-sodium canned vegetables.  Fruits All fresh, canned (in natural juice), or frozen fruits. Meat and Other Protein Products Ground beef (85% or leaner), grass-fed beef, or beef trimmed of fat. Skinless chicken or [place]. Ground chicken or [place]. Pork trimmed of fat. All fish and seafood. Eggs. Dried beans, peas, or lentils. Unsalted nuts and seeds. Unsalted canned beans. Dairy Low-fat dairy products, such as skim or 1% milk, 2% or reduced-fat cheeses, low-fat ricotta or cottage cheese, or plain low-fat yogurt. Low-sodium or reduced-sodium cheeses. Fats and Oils Tub margarines without trans fats. Light or reduced-fat mayonnaise and salad dressings (reduced sodium). Avocado. Safflower, olive, or canola oils. Natural peanut or almond butter. Other Unsalted popcorn and pretzels. The items listed above may not be a complete list of recommended foods or beverages. Contact your dietitian for more options. WHAT FOODS ARE NOT RECOMMENDED? Grains White bread. White pasta. White rice. Refined cornbread. Bagels and croissants. Crackers that contain trans fat. Vegetables Creamed or fried vegetables. Vegetables in a cheese sauce. Regular canned vegetables. Regular canned tomato sauce and  paste. Regular tomato and vegetable juices. Fruits Dried fruits. Canned fruit in light or heavy syrup. Fruit juice. Meat and Other Protein Products Fatty cuts of meat. Ribs, chicken wings, bacon, sausage, bologna, salami, chitterlings, fatback, hot dogs, bratwurst, and packaged luncheon meats. Salted nuts and seeds.  Canned beans with salt. Dairy Whole or 2% milk, cream, half-and-half, and cream cheese. Whole-fat or sweetened yogurt. Full-fat cheeses or blue cheese. Nondairy creamers and whipped toppings. Processed cheese, cheese spreads, or cheese curds. Condiments Onion and garlic salt, seasoned salt, table salt, and sea salt. Canned and packaged gravies. Worcestershire sauce. Tartar sauce. Barbecue sauce. Teriyaki sauce. Soy sauce, including reduced sodium. Steak sauce. Fish sauce. Oyster sauce. Cocktail sauce. Horseradish. Ketchup and mustard. Meat flavorings and tenderizers. Bouillon cubes. Hot sauce. Tabasco sauce. Marinades. Taco seasonings. Relishes. Fats and Oils Butter, stick margarine, lard, shortening, ghee, and bacon fat. Coconut, palm kernel, or palm oils. Regular salad dressings. Other Pickles and olives. Salted popcorn and pretzels. The items listed above may not be a complete list of foods and beverages to avoid. Contact your dietitian for more information. WHERE CAN I FIND MORE INFORMATION? National Heart, Lung, and Blood Institute: travelstabloid.com   This information is not intended to replace advice given to you by your health care provider. Make sure you discuss any questions you have with your health care provider.   Document Released: 04/08/2011 Document Revised: 05/10/2014 Document Reviewed: 02/21/2013 Elsevier Interactive Patient Education 2016 [employer].  How to Take Your Blood Pressure HOW DO I GET A BLOOD PRESSURE MACHINE?  You can buy an electronic home blood pressure machine at your local pharmacy. Insurance will sometimes cover the cost if you have a prescription.  Ask your doctor what type of machine is best for you. There are different machines for your arm and your wrist.  If you decide to buy a machine to check your blood pressure on your arm, first check the size of your arm so you can buy the right size cuff. To check the size of  your arm:   Use a measuring tape that shows both inches and centimeters.   Wrap the measuring tape around the upper-middle part of your arm. You may need someone to help you measure.   Write down your arm measurement in both inches and centimeters.   To measure your blood pressure correctly, it is important to have the right size cuff.   If your arm is up to 13 inches (up to 34 centimeters), get an adult cuff size.  If your arm is 13 to 17 inches (35 to 44 centimeters), get a large adult cuff size.    If your arm is 17 to 20 inches (45 to 52 centimeters), get an adult thigh cuff.  WHAT DO THE NUMBERS MEAN?   There are two numbers that make up your blood pressure. For example: 120/80.  The first number (120 in our example) is called the "systolic pressure." It is a measure of the pressure in your blood vessels when your heart is pumping blood.  The second number (80 in our example) is called the "diastolic pressure." It is a measure of the pressure in your blood vessels when your heart is resting between beats.  Your doctor will tell you what your blood pressure should be. WHAT SHOULD I DO BEFORE I CHECK MY BLOOD PRESSURE?   Try to rest or relax for at least 30 minutes before you check your blood pressure.  Do not smoke.  Do  not have any drinks with caffeine, such as:  Soda.  Coffee.  Tea.  Check your blood pressure in a quiet room.  Sit down and stretch out your arm on a table. Keep your arm at about the level of your heart. Let your arm relax.  Make sure that your legs are not crossed. HOW DO I CHECK MY BLOOD PRESSURE?  Follow the directions that came with your machine.  Make sure you remove any tight-fitting clothing from your arm or wrist. Wrap the cuff around your upper arm or wrist. You should be able to fit a finger between the cuff and your arm. If you cannot fit a finger between the cuff and your arm, it is too tight and should be removed and  rewrapped.  Some units require you to manually pump up the arm cuff.  Automatic units inflate the cuff when you press a button.  Cuff deflation is automatic in both models.  After the cuff is inflated, the unit measures your blood pressure and pulse. The readings are shown on a monitor. Hold still and breathe normally while the cuff is inflated.  Getting a reading takes less than a minute.  Some models store readings in a memory. Some provide a printout of readings. If your machine does not store your readings, keep a written record.  Take readings with you to your next visit with your doctor.   This information is not intended to replace advice given to you by your health care provider. Make sure you discuss any questions you have with your health care provider.   Document Released: 04/01/2008 Document Revised: 05/10/2014 Document Reviewed: 06/14/2013 Elsevier Interactive Patient Education [employer].

## 2015-03-18 ENCOUNTER — Other Ambulatory Visit: Payer: 59

## 2015-03-18 ENCOUNTER — Ambulatory Visit (INDEPENDENT_AMBULATORY_CARE_PROVIDER_SITE_OTHER): Payer: 59 | Admitting: Adult Health

## 2015-03-18 ENCOUNTER — Other Ambulatory Visit: Payer: Self-pay

## 2015-03-18 ENCOUNTER — Encounter: Payer: Self-pay | Admitting: Adult Health

## 2015-03-18 ENCOUNTER — Other Ambulatory Visit (INDEPENDENT_AMBULATORY_CARE_PROVIDER_SITE_OTHER): Payer: 59

## 2015-03-18 VITALS — BP 134/78 | HR 61 | Temp 98.6°F | Ht 64.0 in | Wt 191.0 lb

## 2015-03-18 DIAGNOSIS — E785 Hyperlipidemia, unspecified: Secondary | ICD-10-CM

## 2015-03-18 DIAGNOSIS — G4733 Obstructive sleep apnea (adult) (pediatric): Secondary | ICD-10-CM | POA: Diagnosis not present

## 2015-03-18 DIAGNOSIS — Z23 Encounter for immunization: Secondary | ICD-10-CM | POA: Diagnosis not present

## 2015-03-18 DIAGNOSIS — E669 Obesity, unspecified: Secondary | ICD-10-CM

## 2015-03-18 DIAGNOSIS — Z1159 Encounter for screening for other viral diseases: Secondary | ICD-10-CM

## 2015-03-18 LAB — LIPID PANEL
Cholesterol: 183 mg/dL (ref 0–200)
HDL: 38.2 mg/dL — ABNORMAL LOW (ref >39.00)
LDL Cholesterol: 118 mg/dL — ABNORMAL HIGH (ref 0–99)
NonHDL: 144.51
Total CHOL/HDL Ratio: 5
Triglycerides: 133 mg/dL (ref 0.0–149.0)
VLDL: 26.6 mg/dL (ref 0.0–40.0)

## 2015-03-18 LAB — HEPATIC FUNCTION PANEL
ALT: 33 U/L (ref 0–35)
AST: 23 U/L (ref 0–37)
Albumin: 4 g/dL (ref 3.5–5.2)
Alkaline Phosphatase: 99 U/L (ref 39–117)
Bilirubin, Direct: 0.1 mg/dL (ref 0.0–0.3)
Total Bilirubin: 0.4 mg/dL (ref 0.2–1.2)
Total Protein: 7.5 g/dL (ref 6.0–8.3)

## 2015-03-18 NOTE — Progress Notes (Signed)
   Subjective:    Patient ID: Sarah Salazar, female    DOB: Nov 11, 1955, 59 y.o.   MRN: [id number]  HPI 59 yo female with mild OSA on CPAP    TEST 10/2014 AHI 11/hr   03/18/2015 Follow up : OSA Patient presents for a month follow up Patient was recently found to have some mild sleep apnea with an AHI of 11/hr. Patient says that she is feeling improved and feels as if she has benefited from starting on C Pap therapy. Download from October 15 through November 13 shows excellent compliance. Average. Uses around 5 hours on a set pressure of 8 cm H2O . AHI 2.9, minimal leaks.  Does have some stuffy nose, and drainage. He is using nasal pillows. She has thought about switching to a different mask but would like to stay with her nasal pillows for right now.  We discussed using saline nasal rinses as needed. She denies any chest pain, orthopnea, PND, leg swelling.   Review of Systems Constitutional:   No  weight loss, night sweats,  Fevers, chills, fatigue, or  lassitude.  HEENT:   No headaches,  Difficulty swallowing,  Tooth/dental problems, or  Sore throat,                No sneezing, itching, ear ache,  +nasal congestion, post nasal drip,   CV:  No chest pain,  Orthopnea, PND, swelling in lower extremities, anasarca, dizziness, palpitations, syncope.   GI  No heartburn, indigestion, abdominal pain, nausea, vomiting, diarrhea, change in bowel habits, loss of appetite, bloody stools.   Resp: No shortness of breath with exertion or at rest.  No excess mucus, no productive cough,  No non-productive cough,  No coughing up of blood.  No change in color of mucus.  No wheezing.  No chest wall deformity  Skin: no rash or lesions.  GU: no dysuria, change in color of urine, no urgency or frequency.  No flank pain, no hematuria   MS:  No joint pain or swelling.  No decreased range of motion.  No back pain.  Psych:  No change in mood or affect. No depression or anxiety.  No memory  loss.         Objective:   Physical Exam GEN: A/Ox3; pleasant , NAD, obese  VS reviewed   HEENT:  [place]/AT,  EACs-clear, TMs-wnl, NOSE-clear, THROAT-clear, no lesions, no postnasal drip or exudate noted. Class 2-3 MP airway   NECK:  Supple w/ fair ROM; no JVD; normal carotid impulses w/o bruits; no thyromegaly or nodules palpated; no lymphadenopathy.  RESP  Clear  P & A; w/o, wheezes/ rales/ or rhonchi.no accessory muscle use, no dullness to percussion  CARD:  RRR, no m/r/g  , no peripheral edema, pulses intact, no cyanosis or clubbing.  GI:   Soft & nt; nml bowel sounds; no organomegaly or masses detected.  Musco: Warm bil, no deformities or joint swelling noted.   Neuro: alert, no focal deficits noted.    Skin: Warm, no lesions or rashes         Assessment & Plan:

## 2015-03-18 NOTE — Progress Notes (Signed)
Reviewed & agree with plan  

## 2015-03-18 NOTE — Assessment & Plan Note (Signed)
Controlled well on CPAP   Plan  Continue on CPAP At bedtime   Goal is to wear for at least 6hr each night .  Work on [employer] loss  Flu shot today  Follow up Dr. Elsworth Soho  In 4-6 months and As needed

## 2015-03-18 NOTE — Patient Instructions (Signed)
Continue on CPAP At bedtime   Goal is to wear for at least 6hr each night .  Work on [employer] loss  Flu shot today  Follow up Dr. Elsworth Soho  In 4-6 months and As needed

## 2015-03-18 NOTE — Assessment & Plan Note (Signed)
Work on weight loss.

## 2015-03-19 LAB — HEPATITIS C ANTIBODY: HCV Ab: NEGATIVE

## 2015-03-26 ENCOUNTER — Encounter: Payer: Self-pay | Admitting: Pulmonary Disease

## 2015-03-26 ENCOUNTER — Ambulatory Visit (INDEPENDENT_AMBULATORY_CARE_PROVIDER_SITE_OTHER): Payer: 59 | Admitting: Medical

## 2015-03-26 ENCOUNTER — Encounter: Payer: Self-pay | Admitting: Medical

## 2015-03-26 VITALS — BP 126/84 | HR 88 | Temp 98.1°F | Ht 64.0 in | Wt 191.0 lb

## 2015-03-26 DIAGNOSIS — R1013 Epigastric pain: Secondary | ICD-10-CM | POA: Diagnosis not present

## 2015-03-26 LAB — COMPREHENSIVE METABOLIC PANEL
ALT: 28 U/L (ref 6–29)
AST: 25 U/L (ref 10–35)
Albumin: 3.8 g/dL (ref 3.6–5.1)
Alkaline Phosphatase: 98 U/L (ref 33–130)
BUN: 9 mg/dL (ref 7–25)
CO2: 29 mmol/L (ref 20–31)
Calcium: 8.9 mg/dL (ref 8.6–10.4)
Chloride: 105 mmol/L (ref 98–110)
Creat: 0.82 mg/dL (ref 0.50–1.05)
Glucose, Bld: 87 mg/dL (ref 65–99)
Potassium: 4.3 mmol/L (ref 3.5–5.3)
Sodium: 141 mmol/L (ref 135–146)
Total Bilirubin: 0.3 mg/dL (ref 0.2–1.2)
Total Protein: 7 g/dL (ref 6.1–8.1)

## 2015-03-26 LAB — CBC WITH DIFFERENTIAL/PLATELET
Basophils Absolute: 0 10*3/uL (ref 0.0–0.1)
Basophils Relative: 0 % (ref 0–1)
Eosinophils Absolute: 0.2 10*3/uL (ref 0.0–0.7)
Eosinophils Relative: 3 % (ref 0–5)
HCT: 39.4 % (ref 36.0–46.0)
Hemoglobin: 12.8 g/dL (ref 12.0–15.0)
Lymphocytes Relative: 30 % (ref 12–46)
Lymphs Abs: 2.1 10*3/uL (ref 0.7–4.0)
MCH: 30.5 pg (ref 26.0–34.0)
MCHC: 32.5 g/dL (ref 30.0–36.0)
MCV: 93.8 fL (ref 78.0–100.0)
MPV: 11.5 fL (ref 8.6–12.4)
Monocytes Absolute: 0.5 10*3/uL (ref 0.1–1.0)
Monocytes Relative: 7 % (ref 3–12)
Neutro Abs: 4.1 10*3/uL (ref 1.7–7.7)
Neutrophils Relative %: 60 % (ref 43–77)
Platelets: 254 10*3/uL (ref 150–400)
RBC: 4.2 MIL/uL (ref 3.87–5.11)
RDW: 13.9 % (ref 11.5–15.5)
WBC: 6.9 10*3/uL (ref 4.0–10.5)

## 2015-03-26 LAB — AMYLASE: Amylase: 28 U/L (ref 0–105)

## 2015-03-26 LAB — LIPASE: Lipase: 18 U/L (ref 7–60)

## 2015-03-26 MED ORDER — GI COCKTAIL ~~LOC~~
30.0000 mL | Freq: Once | ORAL | Status: AC
  Administered 2015-03-26: 30 mL via ORAL

## 2015-03-26 MED ORDER — RANITIDINE HCL 150 MG PO CAPS: 150.0000 mg | ORAL_CAPSULE | Freq: Two times a day (BID) | ORAL | Status: DC

## 2015-03-26 MED ORDER — METOCLOPRAMIDE HCL 10 MG PO TABS: 10.0000 mg | ORAL_TABLET | Freq: Three times a day (TID) | ORAL | Status: DC

## 2015-03-26 NOTE — Addendum Note (Signed)
Addended by: Caffie Pinto on: 03/26/2015 03:45 PM   Modules accepted: Orders

## 2015-03-26 NOTE — Progress Notes (Signed)
Pre visit review using our clinic review tool, if applicable. No additional management support is needed unless otherwise documented below in the visit note. 

## 2015-03-26 NOTE — Progress Notes (Signed)
Subjective:    Patient ID: Sarah Salazar, female    DOB: [birth year], 57 y.o.   MRN: [id number]  HPI  Pt in with some epigastric pain. Pain came on this am. Pt in on protonix. Pt states pain is constant pain. With occasional severe increase in pain. No sour or acid taste in throat. No belching. But she does point directly to epigastric area.   This am some pain from stomach radiated to rt side of chest but not presently. But at that time pain faint. Lasted 2 hours. No recurrence since.   Pain is mild presently. No associated back pain. No shortness of breath.    Review of Systems  Constitutional: Negative for fever, chills and fatigue.  HENT: Negative for congestion.   Respiratory: Negative for chest tightness, shortness of breath and wheezing.   Cardiovascular: Negative for chest pain and palpitations.  Gastrointestinal: Positive for abdominal pain. Negative for blood in stool, abdominal distention and anal bleeding.  Musculoskeletal: Negative for back pain.  Neurological: Negative for dizziness and headaches.  Hematological: Negative for adenopathy. Does not bruise/bleed easily.   Past Medical History  Diagnosis Date  . IBS (irritable bowel syndrome)   . Anxiety   . Depression   . Hypothyroidism     hypothyroidism  . Chest pain     Emergency room April 23, 2012, troponin is normal, chest CT scan showed no pulmonary embolus  . Cough     Chronic cough  . Ejection fraction     EF 55-60%, echo, December, 2013, lipomatous hypertrophy of the atrial septum  . GERD (gastroesophageal reflux disease)   . Personal history of failed moderate sedation 06/20/2012  . Personal history of colonic adenoma 06/28/2012  . Complication of anesthesia     "I'm allergic to versed and fentanyl"  . Family history of anesthesia complication     "hard time waking daughter up post SVT ablation"  . Pneumonia     "couple times" (08/01/2013)  . H/O hiatal hernia   . Migraine     "once or twice"  (08/01/2013)  . Bulging lumbar disc     "L3-5" (08/01/2013)  . Bipolar disorder [facility])     Social History   Social History  . Marital Status: Divorced    Spouse Name: N/A  . Number of Children: 1  . Years of Education: N/A   Occupational History  .     Social History Main Topics  . Smoking status: Never Smoker   . Smokeless tobacco: Never Used  . Alcohol Use: Yes     Comment: 08/01/2013 "glass of wine 6 times/year at most"  . Drug Use: No  . Sexual Activity: Yes   Other Topics Concern  . 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 Cholecystectomy    . Dilation and curettage of uterus  X 3  . Knee arthroscopy Right X 2  . Wrist surgery  1979-~ 2010    X 4  . Laparoscopy  1970's - 06/03/1990    "several; to evaluate dysfunctional menses & pelvic pain"  . Cystocele repair  01/13/2009  . Esophagogastroduodenoscopy (egd) with esophageal dilation  2004; 2014  . Colonoscopy  2002  . Elbow surgery Left 2005  . Breast lumpectomy  Left     "1st breast OR"  . Mastectomy, partial Left     "2nd breast OR", benign fibrocystic changes  . Wisdom tooth extraction      "all 4 at once"    Family History  Problem Relation Age of Onset  . Coronary artery disease Mother   . Stroke Mother     TIA  . Uterine cancer Mother     BREAST  . Thyroid disease Mother     goiter  . Lung cancer Father   . Heart disease Father   . COPD Father   . Coronary artery disease Father   . Colon cancer Paternal Uncle     COLON; STOMACH  . Lung cancer Maternal Grandfather   . Rheum arthritis Maternal Aunt   . Supraventricular tachycardia Daughter     S/P ablation  . Thyroid disease Cousin     Allergies  Allergen Reactions  . Amoxicillin Rash       . Fentanyl Rash    given with Versed  . Midazolam Rash    given with Fentanyl  .  Oxycodone-Aspirin Rash  . Sulfonamide Derivatives Rash    rash  . Hydrocodone Hives and Nausea And Vomiting  . Nalbuphine Nausea And Vomiting  . Oxycodone-Acetaminophen Hives and Nausea And Vomiting  . Venlafaxine Other (See Comments)    Patient says makes her crazy  . Verapamil Palpitations    Caused "pounding " in chest    Current Outpatient Prescriptions on File Prior to Visit  Medication Sig Dispense Refill  . albuterol (PROAIR HFA) 108 (90 BASE) MCG/ACT inhaler Inhale 2 puffs into the lungs every 6 (six) hours as needed for wheezing or shortness of breath. 1 Inhaler 2  . ALPRAZolam (XANAX) 1 MG tablet Take 1 tablet by mouth as needed.    . beclomethasone (QVAR) 40 MCG/ACT inhaler Inhale 2 puffs into the lungs 2 (two) times daily. 1 Inhaler 12  . buPROPion (WELLBUTRIN) 100 MG tablet Take 100 mg by mouth at bedtime.    . hyoscyamine (LEVSIN SL) 0.125 MG SL tablet Place 1 tablet (0.125 mg total) under the tongue every 4 (four) hours as needed. 30 tablet 0  . lamoTRIgine (LAMICTAL) 200 MG tablet Take 1 tablet by mouth daily.    . metoprolol tartrate (LOPRESSOR) 25 MG tablet Take 0.5 tablets (12.5 mg total) by mouth 2 (two) times daily. (Patient taking differently: Take 25 mg by mouth 2 (two) times daily. ) 180 tablet 1  . pantoprazole (PROTONIX) 40 MG tablet TAKE 1 TABLET DAILY 90 tablet 0  . thyroid (ARMOUR THYROID) 90 MG tablet take 1 tablet 5 days a week and half tablet twice a week such as Sunday and Wednesday 30 tablet 2  . traMADol (ULTRAM) 50 MG tablet Take 1 tablet (50 mg total) by mouth every 6 (six) hours as needed. 30 tablet 0   No current facility-administered medications on file prior to visit.    BP 126/84 mmHg  Pulse 88  Temp(Src) 98.1 F (36.7 C) (Oral)  Ht 5\' 4"  (1.626 m)  Wt 191 lb (86.637 kg)  BMI 32.77 kg/m2  SpO2 97%       Objective:   Physical Exam  General Appearance- Not in acute distress.  HEENT Eyes- Scleraeral/Conjuntiva-bilat- Not  Yellow. Mouth & Throat- Normal.  Chest and Lung Exam Auscultation: Breath sounds:-Normal. Adventitious sounds:- No Adventitious sounds.  Cardiovascular Auscultation:Rythm - Regular. Heart Sounds -Normal heart sounds.  Abdomen Inspection:-Inspection Normal.  Palpation/Perucssion: Palpation and Percussion  of the abdomen reveal- Mild epigastric  Tender and some rt upper quadrant pain, No Rebound tenderness, No rigidity(Guarding) and No Palpable abdominal masses. No bruit heard on abdomen exam/ausculatation. Liver:-Normal.  Spleen:- Normal.   Back- no cva  Tenderness.  Anterior thorax- no pain on palpation of costochondral junction.      Assessment & Plan:  Some moderate  improvent with GI cocktail. Your ekg look good NSR except mild artifact.  Will rx ranitidine to use in addition to your protonix. Advise healthy diet.  Get labs today.   If abdomen pain worsens over long holiday weekend or if recurrent chest discomfort then ED evaluation.  Follow up in 7 days or as needed.

## 2015-03-26 NOTE — Patient Instructions (Addendum)
Some moderate  improvent with GI cocktail. Your ekg look good NSR except mild artifact.  Will rx ranitidine to use in addition to your protonix. Advise healthy diet.  Get labs today.   If abdomen pain worsens over long holiday weekend or if recurrent chest discomfort then ED evaluation.  Follow up in 7 days or as needed.  After review of your meds I want to rx reglan and hold hycosamine presently. Update [place] early next week.

## 2015-03-26 NOTE — Addendum Note (Signed)
Addended by: Anabel Halon on: 03/26/2015 03:32 PM   Modules accepted: Orders

## 2015-03-26 NOTE — Addendum Note (Signed)
Addended by: Tasia Catchings on: 03/26/2015 03:26 PM   Modules accepted: Orders

## 2015-04-01 ENCOUNTER — Ambulatory Visit (INDEPENDENT_AMBULATORY_CARE_PROVIDER_SITE_OTHER): Payer: 59 | Admitting: Physician Assistant

## 2015-04-01 ENCOUNTER — Encounter: Payer: Self-pay | Admitting: Physician Assistant

## 2015-04-01 VITALS — BP 124/84 | HR 84 | Ht 64.0 in | Wt 188.4 lb

## 2015-04-01 DIAGNOSIS — R140 Abdominal distension (gaseous): Secondary | ICD-10-CM

## 2015-04-01 DIAGNOSIS — R1013 Epigastric pain: Secondary | ICD-10-CM

## 2015-04-01 DIAGNOSIS — R110 Nausea: Secondary | ICD-10-CM

## 2015-04-01 MED ORDER — PROMETHAZINE HCL 25 MG PO TABS: 25.0000 mg | ORAL_TABLET | Freq: Four times a day (QID) | ORAL | Status: DC | PRN

## 2015-04-01 MED ORDER — PROMETHAZINE HCL 25 MG PO TABS
25.0000 mg | ORAL_TABLET | Freq: Four times a day (QID) | ORAL | Status: DC | PRN
Start: 2015-04-01 — End: 2015-06-10

## 2015-04-01 NOTE — Progress Notes (Signed)
Patient ID: Sarah Salazar, female   DOB: 1955/09/10, 59 y.o.   MRN: [id number]   Subjective:    Patient ID: Sarah Salazar, female    DOB: 1955-09-29, 59 y.o.   MRN: [id number]  HPI Sarah Salazar is a pleasant 59 year old white female known to Dr. Carlean Purl, who is referred today by primary care, Ed Saguier  PA-C for evaluation of recurrent epigastric pain and nausea. She had been seen in GI in February 2016 with fairly similar symptoms and had CT of the abdomen and pelvis which was negative. She last had EGD in 2014 which did show esophagitis and mild gastritis and a colonoscopy in 2014 with moderate sigmoid diverticulosis and one 3 mm tubular adenoma. Patient says her symptoms started immediately after her gallbladder was removed about 9 years ago. She says ever since then she has had problems with recurrent abdominal pain and nausea. She has a prescription for Levsin and says that had been somewhat helpful in the past but is not helping at this point. She describes recurrent episodes of epigastric pain and bloating and tightness in the upper abdomen and says she has had episodes of nausea that will last sometimes for weeks. She says she has daily queasiness and nausea but rarely vomits. At this point she is having episodes about once a month. Patient had an episode that started last Wednesday which she says was a very abrupt onset of severe epigastric pain up across the top of her abdomen and had an aching into her back. As one was much sharper and more intense than any of her recent episodes. She was seen by primary care and had been given a trial of metoclopramide ranitidine and Protonix. She says her symptoms have been persisting since that time though the pain is more of a mild dull ache at this point. She still feels bloated and uncomfortable and somewhat nauseated and has been eating very little. Labs were done by primary care on 11/23 and unremarkable including LFTs.  Review of Systems Pertinent positive  and negative review of systems were noted in the above HPI section.  All other review of systems was otherwise negative.  Outpatient Encounter Prescriptions as of 04/01/2015  Medication Sig  . albuterol (PROAIR HFA) 108 (90 BASE) MCG/ACT inhaler Inhale 2 puffs into the lungs every 6 (six) hours as needed for wheezing or shortness of breath.  . ALPRAZolam (XANAX) 1 MG tablet Take 1 tablet by mouth as needed.  . beclomethasone (QVAR) 40 MCG/ACT inhaler Inhale 2 puffs into the lungs 2 (two) times daily.  Marland Kitchen buPROPion (WELLBUTRIN) 100 MG tablet Take 100 mg by mouth at bedtime.  . hyoscyamine (LEVSIN SL) 0.125 MG SL tablet Place 1 tablet (0.125 mg total) under the tongue every 4 (four) hours as needed.  . lamoTRIgine (LAMICTAL) 200 MG tablet Take 1 tablet by mouth daily.  . metoCLOPramide (REGLAN) 10 MG tablet Take 1 tablet (10 mg total) by mouth 3 (three) times daily before meals.  . metoprolol tartrate (LOPRESSOR) 25 MG tablet Take 0.5 tablets (12.5 mg total) by mouth 2 (two) times daily. (Patient taking differently: Take 25 mg by mouth 2 (two) times daily. )  . pantoprazole (PROTONIX) 40 MG tablet TAKE 1 TABLET DAILY  . ranitidine (ZANTAC) 150 MG capsule Take 1 capsule (150 mg total) by mouth 2 (two) times daily.  Marland Kitchen thyroid (ARMOUR THYROID) 90 MG tablet take 1 tablet 5 days a week and half tablet twice a week such as Sunday and Wednesday  .  traMADol (ULTRAM) 50 MG tablet Take 1 tablet (50 mg total) by mouth every 6 (six) hours as needed.  . promethazine (PHENERGAN) 25 MG tablet Take 1 tablet (25 mg total) by mouth every 6 (six) hours as needed for nausea or vomiting.  . [DISCONTINUED] promethazine (PHENERGAN) 25 MG tablet Take 1 tablet (25 mg total) by mouth every 6 (six) hours as needed for nausea or vomiting.   No facility-administered encounter medications on file as of 04/01/2015.   Allergies  Allergen Reactions  . Amoxicillin Rash       . Fentanyl Rash    given with Versed  . Midazolam  Rash    given with Fentanyl  . Oxycodone-Aspirin Rash  . Sulfonamide Derivatives Rash    rash  . Hydrocodone Hives and Nausea And Vomiting  . Nalbuphine Nausea And Vomiting  . Oxycodone-Acetaminophen Hives and Nausea And Vomiting  . Venlafaxine Other (See Comments)    Patient says makes her crazy  . Verapamil Palpitations    Caused "pounding " in chest   Patient Active Problem List   Diagnosis Date Noted  . OSA (obstructive sleep apnea) 10/09/2014  . Fatigue 10/09/2014  . Cough 09/04/2014  . Acute bronchitis 09/04/2014  . Wheezing 09/04/2014  . Laryngitis 09/04/2014  . Midepigastric pain 06/04/2014  . Syncope 08/01/2013  . Palpitations 05/11/2013  . Obesity (BMI 30-39.9) 04/11/2013  . Personal history of colonic adenoma 06/28/2012  . Personal history of failed moderate sedation 06/20/2012  . [name] 06/19/2012  . Ejection fraction   . HTN (hypertension) 05/04/2012  . IBS (irritable bowel syndrome)   . Anxiety   . Depression   . Hypothyroidism   . Migraine   . Precordial pain   . Chronic cough 09/10/2011  . Hallucinations 05/14/2010  . HEADACHE 05/14/2010  . B12 DEFICIENCY 02/17/2010  . BIPOLAR DISORDER UNSPECIFIED 10/23/2009  . HYPOKALEMIA 05/20/2009  . ASTERIXIS 12/20/2006  . HYPERLIPIDEMIA NEC/NOS 10/27/2006  . FASTING HYPERGLYCEMIA 10/27/2006   Social History   Social History  . Marital Status: Divorced    Spouse Name: N/A  . Number of Children: 1  . Years of Education: N/A   Occupational History  .     Social History Main Topics  . Smoking status: Never Smoker   . Smokeless tobacco: Never Used  . Alcohol Use: Yes     Comment: 08/01/2013 "glass of wine 6 times/year at most"  . Drug Use: No  . Sexual Activity: Yes   Other Topics Concern  . Not on file   Social History Narrative   Divorced, has boyfriend   1 daughter   Patient accounts at [employer]   1 caffeinated beverage/day             Ms. Warning family history includes COPD in her  father; Colon cancer in her paternal uncle; Coronary artery disease in her father and mother; Heart disease in her father; Lung cancer in her father and maternal grandfather; Rheum arthritis in her maternal aunt; Stroke in her mother; Supraventricular tachycardia in her daughter; Thyroid disease in her cousin and mother; Uterine cancer in her mother.      Objective:    Filed Vitals:   04/01/15 0858  BP: 124/84  Pulse: 84    Physical Exam    well-developed white female in no acute distress, pleasant blood pressure 124/84 pulse 84 height 5 foot 4 weight 188. HEENT; nontraumatic normocephalic EOMI PERRLA sclera anicteric, Cardiovascular; regular rate and rhythm with S1-S2 no murmur or  gallop, Pulmonary; clear bilaterally, Abdomen; large soft she is tender across her upper abdomen no guarding or rebound no palpable mass or hepatosplenomegaly bowel sounds are present, Rectal; exam not done, Extremities; no clubbing cyanosis or edema skin warm and dry, Neuropsych; mood and affect appropriate       Assessment & Plan:   #1 59 yo female with recurring episodes of epigastric pain and nausea now occurring about once per month and lasting for hours. Patient had a particularly bad episode onset about 6 days ago for which she is still recuperating. She also has ongoing problems with nausea which seems to occur around these episodes but sometimes she has an ongoing queasiness as well. Patient relates symptoms onset immediately after she had cholecystectomy about 9 years ago but worsened over the past several months. Recent labs unremarkable. Etiology of her symptoms is not clear, wonder if she could be having sphincter of Oddi spasm / dysfunction No evidence of choledocholithiasis on previous imaging though possible.  #2 GERD #3 history of IBS #4 obstructive sleep apnea #5 hypothyroidism #6 history of adenomatous colon polyps last colonoscopy 2014 due for follow-up in 2019  Plan;stop metoclopramide,  start Phenergan 12.5 mg by mouth every 6 hours when necessary for nausea Continue Protonix 40 mg by mouth every morning Schedule for upper endoscopy with Dr. Carlean Purl. Procedure discussed in detail with patient and she is agreeable to proceed Schedule MRI and MRCP as suspect that her symptoms may be biliary in etiology. Further plans pending results of above.   Charice Zuno S Moriah Loughry PA-C 04/01/2015   Cc: Rosalita Chessman, DO

## 2015-04-01 NOTE — Patient Instructions (Signed)
We sent a prescription to [facility] You have been scheduled for an endoscopy. Please follow written instructions given to you at your visit today. If you use inhalers (even only as needed), please bring them with you on the day of your procedure. Your physician has requested that you go to www.startemmi.com and enter the access code given to you at your visit today. This web site gives a general overview about your procedure. However, you should still follow specific instructions given to you by our office regarding your preparation for the procedure.  You have been scheduled for an MRI at  [place] on 04-08-2015. Your appointment time is 7:00 am . Please arrive 15 minutes prior to your appointment time for registration purposes. Please make certain not to have anything to eat or drink 4 hours prior to your test. In addition, if you have any metal in your body, have a pacemaker or defibrillator, please be sure to let your ordering physician know. This test typically takes 45 minutes to 1 hour to complete.

## 2015-04-02 ENCOUNTER — Telehealth: Payer: Self-pay

## 2015-04-02 ENCOUNTER — Encounter: Payer: Self-pay | Admitting: Adult Health

## 2015-04-02 ENCOUNTER — Encounter: Payer: Self-pay | Admitting: Internal Medicine

## 2015-04-02 ENCOUNTER — Ambulatory Visit (AMBULATORY_SURGERY_CENTER): Payer: 59 | Admitting: Internal Medicine

## 2015-04-02 ENCOUNTER — Telehealth: Payer: Self-pay | Admitting: *Deleted

## 2015-04-02 VITALS — BP 125/60 | HR 66 | Temp 98.9°F | Resp 21 | Ht 64.0 in | Wt 188.0 lb

## 2015-04-02 DIAGNOSIS — R1013 Epigastric pain: Secondary | ICD-10-CM | POA: Diagnosis present

## 2015-04-02 MED ORDER — SODIUM CHLORIDE 0.9 % IV SOLN: 500.0000 mL | INTRAVENOUS | Status: DC

## 2015-04-02 NOTE — Progress Notes (Signed)
Agree with Ms. Sarah Salazar assessment and plan. Sarah Mayer, MD, [facility]   EGD today relatively unrevealing - biopsies taken. I got a hx of post-prandial bloating and pain and with negative CT earlier this year and NL LFT/amylase and lipase will hold off on MR - cancelled. She could have fat malabsorption after chole - she also eats fair amount of roughage per conversations today at EGD.  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

## 2015-04-02 NOTE — Telephone Encounter (Signed)
Forwarded to Dr. Audree Camel. JG//CMA

## 2015-04-02 NOTE — Progress Notes (Signed)
Report to PACU, RN, vss, BBS= Clear.  

## 2015-04-02 NOTE — Op Note (Signed)
[place]  [address] [place], [address]   ENDOSCOPY PROCEDURE REPORT  PATIENT: Sarah, Salazar  MR#: [id number] BIRTHDATE: 1956-02-21 , 78  yrs. old GENDER: female ENDOSCOPIST: Gatha Mayer, MD, [facility] REFERRED BY: PROCEDURE DATE:  04/02/2015 PROCEDURE:  EGD w/ biopsy ASA CLASS:     Class III INDICATIONS:  epigastric pain. MEDICATIONS: Propofol 200 mg IV and Monitored anesthesia care TOPICAL ANESTHETIC: none  DESCRIPTION OF PROCEDURE: After the risks benefits and alternatives of the procedure were thoroughly explained, informed consent was obtained.  The LB [id number] endoscope was introduced through the mouth and advanced to the second portion of the duodenum , Without limitations.  The instrument was slowly withdrawn as the mucosa was fully examined.    Small hiatal hernia - otherwise noral EGD.  duodenal and gastric biopise taken to look for increaased eosinophils that can be a marker of functional problems.  Retroflexed views revealed a hiatal hernia.     The scope was then withdrawn from the patient and the procedure completed.  COMPLICATIONS: There were no immediate complications.  ENDOSCOPIC IMPRESSION: Small hiatal hernia - otherwise noral EGD.  duodenal and gastric biopise taken to look for increaased eosinophils that can be a marker of functional problems  RECOMMENDATIONS: 1.  Office will call with results 2.  If increased eosinophils consider adding H1 blokers and increasing H2 blockers.  She may need trial of pancreatic enzymes and/or testing for sucrase deficiency to evaluate/treat post-prandial epigastric pain and bloating which she has had for 9+ years.   eSigned:  Gatha Mayer, MD, [facility] 04/02/2015 11:08 AM    CC: The Patient

## 2015-04-02 NOTE — Progress Notes (Signed)
Called to room to assist during endoscopic procedure.  Patient ID and intended procedure confirmed with present staff. Received instructions for my participation in the procedure from the performing physician.  

## 2015-04-02 NOTE — Telephone Encounter (Signed)
Appt cancelled

## 2015-04-02 NOTE — Patient Instructions (Addendum)
The esophagus, stomach and small intestine look ok - you do have a small hiatal hernia that is not causing problems.  I took biopsies to see if that tells me anything and will let you know.  I do not think the MRI is needed and will be cancelling that.  If these biopsies do not give [place] an answer I have other testing and treatment in mind and will let you know. I know you feel bad but the tests you have had done are all reassuring that nothing bad is going on.  I appreciate the opportunity to care for you. Gatha Mayer, MD, FACG   YOU HAD AN ENDOSCOPIC PROCEDURE TODAY AT [facility]:   Refer to the procedure report that was given to you for any specific questions about what was found during the examination.  If the procedure report does not answer your questions, please call your gastroenterologist to clarify.  If you requested that your care partner not be given the details of your procedure findings, then the procedure report has been included in a sealed envelope for you to review at your convenience later.  YOU SHOULD EXPECT: Some feelings of bloating in the abdomen. Passage of more gas than usual.  Walking can help get rid of the air that was put into your GI tract during the procedure and reduce the bloating. If you had a lower endoscopy (such as a colonoscopy or flexible sigmoidoscopy) you may notice spotting of blood in your stool or on the toilet paper. If you underwent a bowel prep for your procedure, you may not have a normal bowel movement for a few days.  Please Note:  You might notice some irritation and congestion in your nose or some drainage.  This is from the oxygen used during your procedure.  There is no need for concern and it should clear up in a day or so.  SYMPTOMS TO REPORT IMMEDIATELY:    Following upper endoscopy (EGD)  Vomiting of blood or coffee ground material  New chest pain or pain under the shoulder blades  Painful or persistently  difficult swallowing  New shortness of breath  Fever of 100F or higher  Black, tarry-looking stools  For urgent or emergent issues, a gastroenterologist can be reached at any hour by calling [phone].   DIET: Your first meal following the procedure should be a small meal and then it is ok to progress to your normal diet. Heavy or fried foods are harder to digest and may make you feel nauseous or bloated.  Likewise, meals heavy in dairy and vegetables can increase bloating.  Drink plenty of fluids but you should avoid alcoholic beverages for 24 hours.  ACTIVITY:  You should plan to take it easy for the rest of today and you should NOT DRIVE or use heavy machinery until tomorrow (because of the sedation medicines used during the test).    FOLLOW UP: Our staff will call the number listed on your records the next business day following your procedure to check on you and address any questions or concerns that you may have regarding the information given to you following your procedure. If we do not reach you, we will leave a message.  However, if you are feeling well and you are not experiencing any problems, there is no need to return our call.  We will assume that you have returned to your regular daily activities without incident.  If any biopsies were taken  you will be contacted by phone or by letter within the next 1-3 weeks.  Please call us at [phone] if you have not heard about the biopsies in 3 weeks.    SIGNATURES/CONFIDENTIALITY: You and/or your care partner have signed paperwork which will be entered into your electronic medical record.  These signatures attest to the fact that that the information above on your After Visit Summary has been reviewed and is understood.  Full responsibility of the confidentiality of this discharge information lies with you and/or your care-partner.

## 2015-04-03 ENCOUNTER — Telehealth: Payer: Self-pay | Admitting: Internal Medicine

## 2015-04-03 ENCOUNTER — Telehealth: Payer: Self-pay

## 2015-04-03 ENCOUNTER — Telehealth: Payer: Self-pay | Admitting: Pulmonary Disease

## 2015-04-03 NOTE — Telephone Encounter (Signed)
  Follow up Call-  Call back number 04/02/2015  Post procedure Call Back phone  # [phone]  Permission to leave phone message Yes     Patient was called for follow up after procedure on 04-02-15. No answer at number given. Left a message.

## 2015-04-03 NOTE — Telephone Encounter (Signed)
Compliance Report - 03/30/15  Per Dr. Elsworth Soho:  CPAP p8cm works well when used. Poor usage. Several missed days Needs to improve compliance.

## 2015-04-03 NOTE — Telephone Encounter (Signed)
Patient notified to remain on the low fiber diet an that we will call with the results of the biopsy when they are available.

## 2015-04-04 ENCOUNTER — Telehealth: Payer: Self-pay | Admitting: Internal Medicine

## 2015-04-04 NOTE — Telephone Encounter (Signed)
Patient states that she went out of town and forgot her machine once and then the other times she hasn't used her machine was because she had surgery on her stomach and was very nauseated from the surgery, so she was not able to tolerate the machine after that surgery.  But, she says that now she is wearing it again.    Nothing further needed. Closing encounter

## 2015-04-04 NOTE — Telephone Encounter (Signed)
Patient reports that she is still having the same symptoms she had prior to her EGD on 04/02/15.  I advised her that the EGD was not therapeutic but diagnostic.  She is advised that Dr. Carlean Purl will have a new plan once he has the biopsy results and he has reviewed.  She is advised to continue her medications and that we will call with recommendations once they are available.

## 2015-04-07 ENCOUNTER — Telehealth: Payer: Self-pay | Admitting: Internal Medicine

## 2015-04-07 NOTE — Telephone Encounter (Signed)
Patient notified that the path results are not available yet She wants to know why the MRI was cancelled.  I discussed with Dr. Carlean Purl.  He stated that he did not feel her symptoms were biliary and the MRI was not necessary.  She was notified of his response.  She is advised that we will call with the results when they are available.

## 2015-04-08 ENCOUNTER — Ambulatory Visit (HOSPITAL_COMMUNITY): Payer: 59

## 2015-04-08 NOTE — Progress Notes (Signed)
Quick Note:  Biopsies are ok  rx Creon 36 K u 1 with meals and 1 with snacks # 150 no refill This will help Dig[employer] food better and hopefully help bloating - she should let me know how she does after 1 month  No letter or recall from [place]  ______

## 2015-04-09 ENCOUNTER — Telehealth: Payer: Self-pay | Admitting: Family Medicine

## 2015-04-09 ENCOUNTER — Telehealth: Payer: Self-pay | Admitting: Internal Medicine

## 2015-04-09 ENCOUNTER — Other Ambulatory Visit: Payer: Self-pay

## 2015-04-09 MED ORDER — PANCRELIPASE (LIP-PROT-AMYL) 36000-114000 UNITS PO CPEP: ORAL_CAPSULE | ORAL | Status: DC

## 2015-04-09 NOTE — Telephone Encounter (Signed)
called and advised pt she stated that GI is suppose to be calling in a new prescription for her. Advised pt that if med is not working then she will need an appt wither with [place] or contact GI again to advise on next steps.

## 2015-04-09 NOTE — Telephone Encounter (Signed)
Patient notified of the results and recommendations See results for additional details.

## 2015-04-09 NOTE — Telephone Encounter (Signed)
Caller name: Kimery  Relationship to patient: Self   Can be reached: [phone]  Reason for call: Pt is requesting her results.  Pt says that she's still experiencing  nausea. She says that she keep being advised to take nausea medication but it isn't helping.  Pt would like to be advised further.

## 2015-04-09 NOTE — Telephone Encounter (Signed)
This patient has been recently evaluated by GI, if she continue with nausea either call them or come back here for a re-evaluation.

## 2015-04-11 ENCOUNTER — Ambulatory Visit (INDEPENDENT_AMBULATORY_CARE_PROVIDER_SITE_OTHER): Payer: 59 | Admitting: Medical

## 2015-04-11 ENCOUNTER — Telehealth: Payer: Self-pay | Admitting: Medical

## 2015-04-11 ENCOUNTER — Encounter: Payer: Self-pay | Admitting: Medical

## 2015-04-11 ENCOUNTER — Telehealth: Payer: Self-pay | Admitting: Internal Medicine

## 2015-04-11 VITALS — BP 143/84 | HR 61 | Temp 98.6°F | Ht 64.0 in | Wt 186.6 lb

## 2015-04-11 DIAGNOSIS — R1110 Vomiting, unspecified: Secondary | ICD-10-CM

## 2015-04-11 DIAGNOSIS — R1013 Epigastric pain: Secondary | ICD-10-CM

## 2015-04-11 DIAGNOSIS — R110 Nausea: Secondary | ICD-10-CM | POA: Diagnosis not present

## 2015-04-11 DIAGNOSIS — R112 Nausea with vomiting, unspecified: Secondary | ICD-10-CM

## 2015-04-11 LAB — COMPREHENSIVE METABOLIC PANEL
ALT: 26 U/L (ref 0–35)
AST: 24 U/L (ref 0–37)
Albumin: 4.1 g/dL (ref 3.5–5.2)
Alkaline Phosphatase: 89 U/L (ref 39–117)
BUN: 8 mg/dL (ref 6–23)
CO2: 31 mEq/L (ref 19–32)
Calcium: 9.6 mg/dL (ref 8.4–10.5)
Chloride: 105 mEq/L (ref 96–112)
Creatinine, Ser: 1.03 mg/dL (ref 0.40–1.20)
GFR: 58.66 mL/min — ABNORMAL LOW (ref >60.00)
Glucose, Bld: 95 mg/dL (ref 70–99)
Potassium: 4.2 mEq/L (ref 3.5–5.1)
Sodium: 140 mEq/L (ref 135–145)
Total Bilirubin: 0.4 mg/dL (ref 0.2–1.2)
Total Protein: 7.4 g/dL (ref 6.0–8.3)

## 2015-04-11 LAB — CBC WITH DIFFERENTIAL/PLATELET
Basophils Absolute: 0 10*3/uL (ref 0.0–0.1)
Basophils Relative: 0.4 % (ref 0.0–3.0)
Eosinophils Absolute: 0.2 10*3/uL (ref 0.0–0.7)
Eosinophils Relative: 2.1 % (ref 0.0–5.0)
HCT: 43.7 % (ref 36.0–46.0)
Hemoglobin: 14.3 g/dL (ref 12.0–15.0)
Lymphocytes Relative: 21.7 % (ref 12.0–46.0)
Lymphs Abs: 1.8 10*3/uL (ref 0.7–4.0)
MCHC: 32.7 g/dL (ref 30.0–36.0)
MCV: 93.2 fl (ref 78.0–100.0)
Monocytes Absolute: 0.5 10*3/uL (ref 0.1–1.0)
Monocytes Relative: 5.8 % (ref 3.0–12.0)
Neutro Abs: 5.9 10*3/uL (ref 1.4–7.7)
Neutrophils Relative %: 70 % (ref 43.0–77.0)
Platelets: 228 10*3/uL (ref 150.0–400.0)
RBC: 4.69 Mil/uL (ref 3.87–5.11)
RDW: 13.7 % (ref 11.5–15.5)
WBC: 8.5 10*3/uL (ref 4.0–10.5)

## 2015-04-11 LAB — LIPASE: Lipase: 15 U/L (ref 11.0–59.0)

## 2015-04-11 LAB — AMYLASE: Amylase: 29 U/L (ref 27–131)

## 2015-04-11 MED ORDER — ONDANSETRON HCL 4 MG PO TABS
4.0000 mg | ORAL_TABLET | Freq: Four times a day (QID) | ORAL | Status: DC | PRN
Start: 2015-04-11 — End: 2015-06-10

## 2015-04-11 MED ORDER — METOCLOPRAMIDE HCL 5 MG PO TABS: ORAL_TABLET | ORAL | Status: DC

## 2015-04-11 NOTE — Telephone Encounter (Signed)
Patient states she is still having problems with Diarrhea usually when she eats.

## 2015-04-11 NOTE — Telephone Encounter (Signed)
Left message for patient to call back Dr. Carlean Purl she states in the past phenergan makes her too sleepy.  Is there an alternative nausea med we can send in?

## 2015-04-11 NOTE — Patient Instructions (Addendum)
For your nausea use phenergan at night. Zofran during the day.  Continue Protonix. Rx reglan since it helped but increase dose incrimental.  Put in GI referral so they can follow up.  Follow up with [place] next 1-2 weeks if GI referral delayed.  If severe or changing symptoms after hours then ED evaluation.  Regarding pt loose stools will get my MA to call her on Monday to see if this persists. If so will ask her to come by and do stool panel studies.

## 2015-04-11 NOTE — Telephone Encounter (Signed)
Patient notified Follow up scheduled for 06/09/14

## 2015-04-11 NOTE — Telephone Encounter (Signed)
Try ondansetron 4 mg q6 prn please as discussed  She will need f/u me if not set up

## 2015-04-11 NOTE — Progress Notes (Signed)
Pre visit review using our clinic review tool, if applicable. No additional management support is needed unless otherwise documented below in the visit note. 

## 2015-04-11 NOTE — Telephone Encounter (Signed)
Pt mentioned toward end of exam some diarrhea last Friday. Call her and see if still present. If so have come in for stool panel studies.

## 2015-04-11 NOTE — Telephone Encounter (Signed)
Left message for patient to return call.

## 2015-04-11 NOTE — Telephone Encounter (Signed)
Bland diet and immodium otc over weekend. If still present by Monday then do stool panel

## 2015-04-11 NOTE — Progress Notes (Signed)
Subjective:    Patient ID: MARTISHA PAONESSA, female    DOB: Nov 02, 1955, 59 y.o.   MRN: [id number]  HPI  Pt in with nausea. She states she has been sick since thanksgiving. Pt states saw PA as GI office since last visit with me. Pt had endoscopy. Pt was MR of her abdomen ordered but she did not get that test done.(pt states that she does not respond to zofran and phenergan makes her nausea.).  Pt had some disagreement with GI staff as to what test was supposed to be done after endoscopy.  Pt does have history of hiatal hernia.  Pt states pancrease enzymes but that did not help.  Pt is on protonix presently.   Pt states GI told her to stop zantac.  Pt states she has daily dry heave and nausea.   Pt saw gi about 2 wks ago  Pt mentions in past that when I gave reglan 10 mg that it did help her. Symptoms did improve moderatley.     Review of Systems  Constitutional: Negative for fever, chills and fatigue.  Respiratory: Negative for cough, chest tightness, shortness of breath and wheezing.   Cardiovascular: Negative for chest pain and palpitations.  Gastrointestinal: Positive for abdominal pain and diarrhea.       Some found on exam.  Loose stools started on this wed.  Musculoskeletal: Negative for back pain.  Skin: Negative for rash.  Hematological: Negative for adenopathy. Does not bruise/bleed easily.  Psychiatric/Behavioral: Negative for behavioral problems and confusion.   Past Medical History  Diagnosis Date  . IBS (irritable bowel syndrome)   . Anxiety   . Depression   . Hypothyroidism     hypothyroidism  . Chest pain     Emergency room April 23, 2012, troponin is normal, chest CT scan showed no pulmonary embolus  . Cough     Chronic cough  . Ejection fraction     EF 55-60%, echo, December, 2013, lipomatous hypertrophy of the atrial septum  . GERD (gastroesophageal reflux disease)   . Personal history of failed moderate sedation 06/20/2012  . Personal history  of colonic adenoma 06/28/2012  . Complication of anesthesia     "I'm allergic to versed and fentanyl"  . Family history of anesthesia complication     "hard time waking daughter up post SVT ablation"  . Pneumonia     "couple times" (08/01/2013)  . H/O hiatal hernia   . Migraine     "once or twice" (08/01/2013)  . Bulging lumbar disc     "L3-5" (08/01/2013)  . Bipolar disorder [facility])     Social History   Social History  . Marital Status: Divorced    Spouse Name: N/A  . Number of Children: 1  . Years of Education: N/A   Occupational History  .     Social History Main Topics  . Smoking status: Never Smoker   . Smokeless tobacco: Never Used  . Alcohol Use: Yes     Comment: 08/01/2013 "glass of wine 6 times/year at most"  . Drug Use: No  . Sexual Activity: Yes   Other Topics Concern  . 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 Cholecystectomy    . Dilation and curettage of  uterus  X 3  . Knee arthroscopy Right X 2  . Wrist surgery  1979-~ 2010    X 4  . Laparoscopy  1970's - 06/03/1990    "several; to evaluate dysfunctional menses & pelvic pain"  . Cystocele repair  01/13/2009  . Esophagogastroduodenoscopy (egd) with esophageal dilation  2004; 2014  . Colonoscopy  2002  . Elbow surgery Left 2005  . Breast lumpectomy Left     "1st breast OR"  . Mastectomy, partial Left     "2nd breast OR", benign fibrocystic changes  . Wisdom tooth extraction      "all 4 at once"    Family History  Problem Relation Age of Onset  . Coronary artery disease Mother   . Stroke Mother     TIA  . Uterine cancer Mother     BREAST  . Thyroid disease Mother     goiter  . Lung cancer Father   . Heart disease Father   . COPD Father   . Coronary artery disease Father   . Colon cancer Paternal Uncle     COLON; STOMACH  . Lung  cancer Maternal Grandfather   . Rheum arthritis Maternal Aunt   . Supraventricular tachycardia Daughter     S/P ablation  . Thyroid disease Cousin     Allergies  Allergen Reactions  . Amoxicillin Rash       . Fentanyl Rash    given with Versed  . Midazolam Rash    given with Fentanyl  . Oxycodone-Aspirin Rash  . Sulfonamide Derivatives Rash    rash  . Hydrocodone Hives and Nausea And Vomiting  . Nalbuphine Nausea And Vomiting  . Oxycodone-Acetaminophen Hives and Nausea And Vomiting  . Venlafaxine Other (See Comments)    Patient says makes her crazy  . Verapamil Palpitations    Caused "pounding " in chest    Current Outpatient Prescriptions on File Prior to Visit  Medication Sig Dispense Refill  . albuterol (PROAIR HFA) 108 (90 BASE) MCG/ACT inhaler Inhale 2 puffs into the lungs every 6 (six) hours as needed for wheezing or shortness of breath. 1 Inhaler 2  . ALPRAZolam (XANAX) 1 MG tablet Take 1 tablet by mouth as needed.    . beclomethasone (QVAR) 40 MCG/ACT inhaler Inhale 2 puffs into the lungs 2 (two) times daily. 1 Inhaler 12  . buPROPion (WELLBUTRIN) 100 MG tablet Take 100 mg by mouth at bedtime.    . hyoscyamine (LEVSIN SL) 0.125 MG SL tablet Place 1 tablet (0.125 mg total) under the tongue every 4 (four) hours as needed. 30 tablet 0  . lamoTRIgine (LAMICTAL) 200 MG tablet Take 1 tablet by mouth daily.    . lipase/protease/amylase (CREON) 36000 UNITS CPEP capsule Take 1 with meals and one with a snack 150 capsule 0  . metaxalone (SKELAXIN) 800 MG tablet     . metoCLOPramide (REGLAN) 10 MG tablet Take 1 tablet (10 mg total) by mouth 3 (three) times daily before meals. 30 tablet 0  . metoprolol tartrate (LOPRESSOR) 25 MG tablet Take 0.5 tablets (12.5 mg total) by mouth 2 (two) times daily. (Patient taking differently: Take 25 mg by mouth 2 (two) times daily. ) 180 tablet 1  . ondansetron (ZOFRAN) 4 MG tablet Take 1 tablet (4 mg total) by mouth every 6 (six) hours as needed  for nausea or vomiting. 60 tablet 1  . pantoprazole (PROTONIX) 40 MG tablet TAKE 1 TABLET DAILY 90 tablet 0  . promethazine (PHENERGAN) 25  MG tablet Take 1 tablet (25 mg total) by mouth every 6 (six) hours as needed for nausea or vomiting. 40 tablet 0  . ranitidine (ZANTAC) 150 MG capsule Take 1 capsule (150 mg total) by mouth 2 (two) times daily. 60 capsule 0  . thyroid (ARMOUR THYROID) 90 MG tablet take 1 tablet 5 days a week and half tablet twice a week such as Sunday and Wednesday 30 tablet 2  . traMADol (ULTRAM) 50 MG tablet Take 1 tablet (50 mg total) by mouth every 6 (six) hours as needed. 30 tablet 0   No current facility-administered medications on file prior to visit.    BP 143/84 mmHg  Pulse 61  Temp(Src) 98.6 F (37 C) (Oral)  Ht 5\' 4"  (1.626 m)  Wt 186 lb 9.6 oz (84.641 kg)  BMI 32.01 kg/m2  SpO2 96%       Objective:   Physical Exam   General Appearance- Not in acute distress.  HEENT Eyes- Scleraeral/Conjuntiva-bilat- Not Yellow. Mouth & Throat- Normal.  Chest and Lung Exam Auscultation: Breath sounds:-Normal. Adventitious sounds:- No Adventitious sounds.  Cardiovascular Auscultation:Rythm - Regular. Heart Sounds -Normal heart sounds.  Abdomen Inspection:-Inspection Normal.  Palpation/Perucssion: Palpation and Percussion of the abdomen reveal- mild epigastric  Tender, No Rebound tenderness, No rigidity(Guarding) and No Palpable abdominal masses.  Liver:-Normal.  Spleen:- Normal.   Back - no cva tenderness       Assessment & Plan:

## 2015-04-14 ENCOUNTER — Telehealth: Payer: Self-pay

## 2015-04-14 NOTE — Telephone Encounter (Signed)
Left message for patient to call back,.  

## 2015-04-14 NOTE — Telephone Encounter (Signed)
Called patient back to see if she was still having diarrhea. Left another message for call back.

## 2015-04-15 ENCOUNTER — Other Ambulatory Visit: Payer: Self-pay | Admitting: Endocrinology

## 2015-04-15 ENCOUNTER — Other Ambulatory Visit (INDEPENDENT_AMBULATORY_CARE_PROVIDER_SITE_OTHER): Payer: 59

## 2015-04-15 ENCOUNTER — Encounter: Payer: Self-pay | Admitting: Pulmonary Disease

## 2015-04-15 DIAGNOSIS — E038 Other specified hypothyroidism: Secondary | ICD-10-CM | POA: Diagnosis not present

## 2015-04-15 DIAGNOSIS — E063 Autoimmune thyroiditis: Secondary | ICD-10-CM

## 2015-04-15 LAB — T4, FREE: Free T4: 0.74 ng/dL (ref 0.60–1.60)

## 2015-04-15 LAB — T3, FREE: T3, Free: 4.1 pg/mL (ref 2.3–4.2)

## 2015-04-15 LAB — TSH: TSH: 0.12 u[IU]/mL — ABNORMAL LOW (ref 0.35–4.50)

## 2015-04-18 ENCOUNTER — Ambulatory Visit: Payer: 59 | Admitting: Endocrinology

## 2015-04-18 ENCOUNTER — Telehealth: Payer: Self-pay | Admitting: Endocrinology

## 2015-04-18 NOTE — Telephone Encounter (Signed)
Patient no showed today's appt. Please advise on how to follow up. °A. No follow up necessary. °B. Follow up urgent. Contact patient immediately. °C. Follow up necessary. Contact patient and schedule visit in ___ days. °D. Follow up advised. Contact patient and schedule visit in ____weeks. ° °

## 2015-04-20 NOTE — Telephone Encounter (Signed)
Needs to be seen soon

## 2015-04-21 ENCOUNTER — Encounter: Payer: Self-pay | Admitting: Medical

## 2015-04-21 ENCOUNTER — Ambulatory Visit (INDEPENDENT_AMBULATORY_CARE_PROVIDER_SITE_OTHER): Payer: 59 | Admitting: Medical

## 2015-04-21 VITALS — BP 124/80 | HR 90 | Temp 98.3°F | Ht 64.0 in | Wt 188.6 lb

## 2015-04-21 DIAGNOSIS — J029 Acute pharyngitis, unspecified: Secondary | ICD-10-CM

## 2015-04-21 DIAGNOSIS — R059 Cough, unspecified: Secondary | ICD-10-CM

## 2015-04-21 DIAGNOSIS — J209 Acute bronchitis, unspecified: Secondary | ICD-10-CM | POA: Diagnosis not present

## 2015-04-21 DIAGNOSIS — R05 Cough: Secondary | ICD-10-CM | POA: Diagnosis not present

## 2015-04-21 LAB — POCT RAPID STREP A (OFFICE): Rapid Strep A Screen: NEGATIVE

## 2015-04-21 MED ORDER — BENZONATATE 100 MG PO CAPS: 100.0000 mg | ORAL_CAPSULE | Freq: Three times a day (TID) | ORAL | Status: DC | PRN

## 2015-04-21 MED ORDER — FLUTICASONE PROPIONATE 50 MCG/ACT NA SUSP: 2.0000 | Freq: Every day | NASAL | Status: DC

## 2015-04-21 MED ORDER — AZITHROMYCIN 250 MG PO TABS: ORAL_TABLET | ORAL | Status: DC

## 2015-04-21 NOTE — Progress Notes (Signed)
Subjective:    Patient ID: Sarah Salazar, female    DOB: 10/28/1955, 59 y.o.   MRN: [id number]  HPI  Pt in with cough. Pt about to leave out of town. She will visit her family and dad has severe copd. Pt does not want to give him illness.  Pt states feels like getting bronchitis. She got bronchitis last year that  About  3-4 times. Pt feels like throat irritation. No nasal congestion or sinus pressure.  Pt has taken benzonatate in April and did not have any reaction.   Review of Systems  Constitutional: Negative for fever and chills.  HENT: Positive for postnasal drip and sore throat. Negative for congestion, ear discharge, facial swelling and sinus pressure.   Respiratory: Positive for cough. Negative for chest tightness, shortness of breath and wheezing.   Cardiovascular: Negative for chest pain and palpitations.  Gastrointestinal: Negative for abdominal pain.  Skin: Negative for rash.  Neurological: Negative for dizziness and headaches.  Hematological: Negative for adenopathy. Does not bruise/bleed easily.    Past Medical History  Diagnosis Date  . IBS (irritable bowel syndrome)   . Anxiety   . Depression   . Hypothyroidism     hypothyroidism  . Chest pain     Emergency room April 23, 2012, troponin is normal, chest CT scan showed no pulmonary embolus  . Cough     Chronic cough  . Ejection fraction     EF 55-60%, echo, December, 2013, lipomatous hypertrophy of the atrial septum  . GERD (gastroesophageal reflux disease)   . Personal history of failed moderate sedation 06/20/2012  . Personal history of colonic adenoma 06/28/2012  . Complication of anesthesia     "I'm allergic to versed and fentanyl"  . Family history of anesthesia complication     "hard time waking daughter up post SVT ablation"  . Pneumonia     "couple times" (08/01/2013)  . H/O hiatal hernia   . Migraine     "once or twice" (08/01/2013)  . Bulging lumbar disc     "L3-5" (08/01/2013)  . Bipolar  disorder [facility])     Social History   Social History  . Marital Status: Divorced    Spouse Name: N/A  . Number of Children: 1  . Years of Education: N/A   Occupational History  .     Social History Main Topics  . Smoking status: Never Smoker   . Smokeless tobacco: Never Used  . Alcohol Use: Yes     Comment: 08/01/2013 "glass of wine 6 times/year at most"  . Drug Use: No  . Sexual Activity: Yes   Other Topics Concern  . 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 Cholecystectomy    . Dilation and curettage of uterus  X 3  . Knee arthroscopy Right X 2  . Wrist surgery  1979-~ 2010    X 4  . Laparoscopy  1970's - 06/03/1990    "several; to evaluate dysfunctional menses & pelvic pain"  . Cystocele repair  01/13/2009  . Esophagogastroduodenoscopy (egd) with esophageal dilation  2004; 2014  . Colonoscopy  2002  . Elbow surgery Left 2005  . Breast lumpectomy Left     "1st breast OR"  . Mastectomy,  partial Left     "2nd breast OR", benign fibrocystic changes  . Wisdom tooth extraction      "all 4 at once"    Family History  Problem Relation Age of Onset  . Coronary artery disease Mother   . Stroke Mother     TIA  . Uterine cancer Mother     BREAST  . Thyroid disease Mother     goiter  . Lung cancer Father   . Heart disease Father   . COPD Father   . Coronary artery disease Father   . Colon cancer Paternal Uncle     COLON; STOMACH  . Lung cancer Maternal Grandfather   . Rheum arthritis Maternal Aunt   . Supraventricular tachycardia Daughter     S/P ablation  . Thyroid disease Cousin     Allergies  Allergen Reactions  . Amoxicillin Rash       . Fentanyl Rash    given with Versed  . Midazolam Rash    given with Fentanyl  . Oxycodone-Aspirin Rash  . Sulfonamide Derivatives Rash    rash  .  Hydrocodone Hives and Nausea And Vomiting  . Nalbuphine Nausea And Vomiting  . Oxycodone-Acetaminophen Hives and Nausea And Vomiting  . Venlafaxine Other (See Comments)    Patient says makes her crazy  . Verapamil Palpitations    Caused "pounding " in chest    Current Outpatient Prescriptions on File Prior to Visit  Medication Sig Dispense Refill  . albuterol (PROAIR HFA) 108 (90 BASE) MCG/ACT inhaler Inhale 2 puffs into the lungs every 6 (six) hours as needed for wheezing or shortness of breath. 1 Inhaler 2  . ALPRAZolam (XANAX) 1 MG tablet Take 1 tablet by mouth as needed.    . beclomethasone (QVAR) 40 MCG/ACT inhaler Inhale 2 puffs into the lungs 2 (two) times daily. 1 Inhaler 12  . buPROPion (WELLBUTRIN) 100 MG tablet Take 100 mg by mouth at bedtime.    . hyoscyamine (LEVSIN SL) 0.125 MG SL tablet Place 1 tablet (0.125 mg total) under the tongue every 4 (four) hours as needed. 30 tablet 0  . lamoTRIgine (LAMICTAL) 200 MG tablet Take 1 tablet by mouth daily.    . lipase/protease/amylase (CREON) 36000 UNITS CPEP capsule Take 1 with meals and one with a snack 150 capsule 0  . metaxalone (SKELAXIN) 800 MG tablet     . metoCLOPramide (REGLAN) 5 MG tablet 3 tabs po tid 90 tablet 0  . metoprolol tartrate (LOPRESSOR) 25 MG tablet Take 0.5 tablets (12.5 mg total) by mouth 2 (two) times daily. (Patient taking differently: Take 25 mg by mouth 2 (two) times daily. ) 180 tablet 1  . ondansetron (ZOFRAN) 4 MG tablet Take 1 tablet (4 mg total) by mouth every 6 (six) hours as needed for nausea or vomiting. 60 tablet 1  . pantoprazole (PROTONIX) 40 MG tablet TAKE 1 TABLET DAILY 90 tablet 0  . promethazine (PHENERGAN) 25 MG tablet Take 1 tablet (25 mg total) by mouth every 6 (six) hours as needed for nausea or vomiting. 40 tablet 0  . ranitidine (ZANTAC) 150 MG capsule Take 1 capsule (150 mg total) by mouth 2 (two) times daily. 60 capsule 0  . thyroid (ARMOUR THYROID) 90 MG tablet take 1 tablet 5 days a  week and half tablet twice a week such as Sunday and Wednesday 30 tablet 2  . traMADol (ULTRAM) 50 MG tablet Take 1 tablet (50 mg total) by mouth every 6 (six) hours  as needed. 30 tablet 0   No current facility-administered medications on file prior to visit.    BP 124/80 mmHg  Pulse 90  Temp(Src) 98.3 F (36.8 C) (Oral)  Ht 5\' 4"  (1.626 m)  Wt 188 lb 9.6 oz (85.548 kg)  BMI 32.36 kg/m2  SpO2 98%       Objective:   Physical Exam  General  Mental Status - Alert. General Appearance - Well groomed. Not in acute distress.  Skin Rashes- No Rashes.  HEENT Head- Normal. Ear Auditory Canal - Left- Normal. Right - Normal.Tympanic Membrane- Left- Normal. Right- Normal. Eye Sclera/Conjunctiva- Left- Normal. Right- Normal. Nose & Sinuses Nasal Mucosa- Left-  boggy and  Congested. Right-   boggy and  Congested. Mouth & Throat Lips: Upper Lip- Normal: no dryness, cracking, pallor, cyanosis, or vesicular eruption. Lower Lip-Normal: no dryness, cracking, pallor, cyanosis or vesicular eruption. Buccal Mucosa- Bilateral- No Aphthous ulcers. Oropharynx- No Discharge or Erythema. +pnd Tonsils: Characteristics- Bilateral- No Erythema or Congestion. Size/Enlargement- Bilateral- No enlargement. Discharge- bilateral-None.  Neck Neck- Supple. No Masses.   Chest and Lung Exam Auscultation: Breath Sounds:- even and unlabored, but bilateral upper lobe rhonchi.  Cardiovascular Auscultation:Rythm- Regular, rate and rhythm. Murmurs & Other Heart Sounds:Ausculatation of the heart reveal- No Murmurs.  Lymphatic Head & Neck General Head & Neck Lymphatics: Bilateral: Description- No Localized lymphadenopathy.       Assessment & Plan:  For your cough will rx benzonatate.  For your bronchitis will rx azithromycin antibiotic.  You do have nasal congestion by exam and some pnd. I think trial of flonase would be helpful. Will give refills.  Faint pharyngitis per pt. Rapid step was  negative

## 2015-04-21 NOTE — Patient Instructions (Addendum)
For your cough will rx benzonatate.  For your bronchitis will rx azithromycin antibiotic.  You do have nasal congestion  by exam and some pnd. I think trial of flonase would be helpful. Will give refills.  Follow up in 7-10 days if any symptoms pesrist or as needed.

## 2015-04-21 NOTE — Progress Notes (Signed)
Pre visit review using our clinic review tool, if applicable. No additional management support is needed unless otherwise documented below in the visit note. 

## 2015-05-05 ENCOUNTER — Other Ambulatory Visit: Payer: Self-pay | Admitting: Family Medicine

## 2015-05-06 ENCOUNTER — Ambulatory Visit (INDEPENDENT_AMBULATORY_CARE_PROVIDER_SITE_OTHER): Payer: 59 | Admitting: Endocrinology

## 2015-05-06 ENCOUNTER — Encounter: Payer: Self-pay | Admitting: Endocrinology

## 2015-05-06 VITALS — BP 126/82 | HR 96 | Temp 97.7°F | Resp 56 | Ht 64.0 in | Wt 191.8 lb

## 2015-05-06 DIAGNOSIS — E038 Other specified hypothyroidism: Secondary | ICD-10-CM

## 2015-05-06 DIAGNOSIS — E063 Autoimmune thyroiditis: Secondary | ICD-10-CM

## 2015-05-06 LAB — TSH: TSH: 3.64 u[IU]/mL (ref 0.35–4.50)

## 2015-05-06 LAB — T3, FREE: T3, Free: 3 pg/mL (ref 2.3–4.2)

## 2015-05-06 NOTE — Progress Notes (Signed)
Quick Note:  Please let patient know that the thyroid levels are still in the normal range, recommend taking only half a tablet of 90 mg Armour Thyroid daily. May send prescription to mail-order ______

## 2015-05-06 NOTE — Patient Instructions (Signed)
Take 1 pill alt with 1/2

## 2015-05-06 NOTE — Progress Notes (Signed)
Patient ID: Sarah Salazar, female   DOB: Jan 13, 1956, 60 y.o.   MRN: [id number]            Reason for Appointment:  Hypothyroidism, follow-up visit    History of Present Illness:   The Hypothyroidism was first diagnosed in the year 2000  No records are available from the time of her initial diagnosis but patient reports that she had gained a very significant amount of weight gain over about 2 months and was having some problems with fatigue and increased sweating. She was told that her thyroid level was borderline only but started her on thyroid supplement She thinks that with taking the thyroid supplement her weight went down to normal and she started feeling better with her energy  Her thyroid regimen has been the same dose since 2012 of generic levothyroxine, 100 g. She takes her thyroid supplement regularly in the mornings Her TSH levels have been consistently normal since 2007 except low in 6/16  Because of her complaining of significant fatigue, sleepiness and depression she was given a trial of Armour Thyroid in 11/16  She thinks she felt better with this with her energy level. However she did not come back for her follow-up after her labs were done more TSH was decreased with her regimen of 90 mg 5 does week and 45 mg twice a week  She also has had intercurrent problems with bronchitis and GI problems and has not felt well recently. Also complains of cold intolerance now. She has been off her thyroid medication since she did not get it filled for at least 2 weeks, she didn't know that she had a refill  Wt Readings from Last 3 Encounters:  05/06/15 191 lb 12.8 oz (87 kg)  04/21/15 188 lb 9.6 oz (85.548 kg)  04/11/15 186 lb 9.6 oz (84.641 kg)       Lab Results  Component Value Date   TSH 0.12* 04/15/2015   TSH 0.36 03/07/2015   TSH 1.294 12/06/2014   FREET4 0.74 04/15/2015   FREET4 1.26 03/07/2015   FREET4 0.97 10/22/2014       Past Medical History  Diagnosis  Date  . IBS (irritable bowel syndrome)   . Anxiety   . Depression   . Hypothyroidism     hypothyroidism  . Chest pain     Emergency room April 23, 2012, troponin is normal, chest CT scan showed no pulmonary embolus  . Cough     Chronic cough  . Ejection fraction     EF 55-60%, echo, December, 2013, lipomatous hypertrophy of the atrial septum  . GERD (gastroesophageal reflux disease)   . Personal history of failed moderate sedation 06/20/2012  . Personal history of colonic adenoma 06/28/2012  . Complication of anesthesia     "I'm allergic to versed and fentanyl"  . Family history of anesthesia complication     "hard time waking daughter up post SVT ablation"  . Pneumonia     "couple times" (08/01/2013)  . H/O hiatal hernia   . Migraine     "once or twice" (08/01/2013)  . Bulging lumbar disc     "L3-5" (08/01/2013)  . Bipolar disorder [facility])     Past Surgical History  Procedure Laterality Date  . Cholecystectomy    . Dilation and curettage of uterus  X 3  . Knee arthroscopy Right X 2  . Wrist surgery  1979-~ 2010    X 4  . Laparoscopy  1970's - 06/03/1990    "  several; to evaluate dysfunctional menses & pelvic pain"  . Cystocele repair  01/13/2009  . Esophagogastroduodenoscopy (egd) with esophageal dilation  2004; 2014  . Colonoscopy  2002  . Elbow surgery Left 2005  . Breast lumpectomy Left     "1st breast OR"  . Mastectomy, partial Left     "2nd breast OR", benign fibrocystic changes  . Wisdom tooth extraction      "all 4 at once"    Family History  Problem Relation Age of Onset  . Coronary artery disease Mother   . Stroke Mother     TIA  . Uterine cancer Mother     BREAST  . Thyroid disease Mother     goiter  . Lung cancer Father   . Heart disease Father   . COPD Father   . Coronary artery disease Father   . Colon cancer Paternal Uncle     COLON; STOMACH  . Lung cancer Maternal Grandfather   . Rheum arthritis Maternal Aunt   . Supraventricular tachycardia  Daughter     S/P ablation  . Thyroid disease Cousin     Social History:  reports that she has never smoked. She has never used smokeless tobacco. She reports that she drinks alcohol. She reports that she does not use illicit drugs.  Allergies:  Allergies  Allergen Reactions  . Amoxicillin Rash       . Fentanyl Rash    given with Versed  . Midazolam Rash    given with Fentanyl  . Oxycodone-Aspirin Rash  . Sulfonamide Derivatives Rash    rash  . Hydrocodone Hives and Nausea And Vomiting  . Nalbuphine Nausea And Vomiting  . Oxycodone-Acetaminophen Hives and Nausea And Vomiting  . Venlafaxine Other (See Comments)    Patient says makes her crazy  . Verapamil Palpitations    Caused "pounding " in chest      Medication List       This list is accurate as of: 05/06/15  8:50 AM.  Always use your most recent med list.               albuterol 108 (90 Base) MCG/ACT inhaler  Commonly known as:  PROAIR HFA  Inhale 2 puffs into the lungs every 6 (six) hours as needed for wheezing or shortness of breath.     ALPRAZolam 1 MG tablet  Commonly known as:  XANAX  Take 1 tablet by mouth as needed.     beclomethasone 40 MCG/ACT inhaler  Commonly known as:  QVAR  Inhale 2 puffs into the lungs 2 (two) times daily.     benzonatate 100 MG capsule  Commonly known as:  TESSALON  Take 1 capsule (100 mg total) by mouth 3 (three) times daily as needed.     buPROPion 100 MG tablet  Commonly known as:  WELLBUTRIN  Take 100 mg by mouth at bedtime.     fluticasone 50 MCG/ACT nasal spray  Commonly known as:  FLONASE  Place 2 sprays into both nostrils daily.     hyoscyamine 0.125 MG SL tablet  Commonly known as:  LEVSIN SL  Place 1 tablet (0.125 mg total) under the tongue every 4 (four) hours as needed.     lamoTRIgine 200 MG tablet  Commonly known as:  LAMICTAL  Take 1 tablet by mouth daily.     lipase/protease/amylase 36000 UNITS Cpep capsule  Commonly known as:  CREON  Take 1 with  meals and one with a snack  metaxalone 800 MG tablet  Commonly known as:  SKELAXIN     metoCLOPramide 5 MG tablet  Commonly known as:  REGLAN  3 tabs po tid     metoprolol tartrate 25 MG tablet  Commonly known as:  LOPRESSOR  Take 0.5 tablets (12.5 mg total) by mouth 2 (two) times daily.     ondansetron 4 MG tablet  Commonly known as:  ZOFRAN  Take 1 tablet (4 mg total) by mouth every 6 (six) hours as needed for nausea or vomiting.     pantoprazole 40 MG tablet  Commonly known as:  PROTONIX  TAKE 1 TABLET DAILY     promethazine 25 MG tablet  Commonly known as:  PHENERGAN  Take 1 tablet (25 mg total) by mouth every 6 (six) hours as needed for nausea or vomiting.     ranitidine 150 MG capsule  Commonly known as:  ZANTAC  Take 1 capsule (150 mg total) by mouth 2 (two) times daily.     thyroid 90 MG tablet  Commonly known as:  ARMOUR THYROID  take 1 tablet 5 days a week and half tablet twice a week such as Sunday and Wednesday     traMADol 50 MG tablet  Commonly known as:  ULTRAM  Take 1 tablet (50 mg total) by mouth every 6 (six) hours as needed.         ROS   Examination:    BP 126/82 mmHg  Pulse 96  Temp(Src) 97.7 F (36.5 C)  Resp 56  Ht 5\' 4"  (1.626 m)  Wt 191 lb 12.8 oz (87 kg)  BMI 32.91 kg/m2   Biceps reflexes appear normal  Assessments   1. Hypothyroidism by history.  She thinks she was subjectively better with starting Armour Thyroid instead of levothyroxine although her TSH was low with her regimen of 90 mg 5 days a week and 45 mg twice a week She is not taking any medications currently as she did not know she had a refill and she is unsure when she ran out  PLAN:     Check TSH today to confirm that her level is high without supplement  Most likely will have her take the Armour thyroid half tablet alternating with full tablet and follow-up in 2 months   Emmery Seiler 05/06/2015, 8:50 AM    Addendum: Her TSH is normal and free T3 was also  normal.  Not clear if she is truly hypothyroid.  Will have her start only with 45 mg Armour Thyroid for now

## 2015-05-07 ENCOUNTER — Encounter: Payer: Self-pay | Admitting: Acute Care

## 2015-05-07 ENCOUNTER — Ambulatory Visit (INDEPENDENT_AMBULATORY_CARE_PROVIDER_SITE_OTHER): Payer: 59 | Admitting: Acute Care

## 2015-05-07 ENCOUNTER — Encounter: Payer: Self-pay | Admitting: *Deleted

## 2015-05-07 VITALS — BP 128/82 | HR 67 | Temp 98.4°F | Ht 64.0 in | Wt 191.2 lb

## 2015-05-07 DIAGNOSIS — J040 Acute laryngitis: Secondary | ICD-10-CM | POA: Diagnosis not present

## 2015-05-07 DIAGNOSIS — R05 Cough: Secondary | ICD-10-CM | POA: Diagnosis not present

## 2015-05-07 DIAGNOSIS — R053 Chronic cough: Secondary | ICD-10-CM

## 2015-05-07 MED ORDER — PREDNISONE 10 MG PO TABS: ORAL_TABLET | ORAL | Status: DC

## 2015-05-07 NOTE — Assessment & Plan Note (Addendum)
Pt. Has had flare of chronic cough with laryngitis. She has been treated recently for bronchitis with ZPack . She is afebrile and otherwise feels well. Suspect flare could be related to viral exposure.   Plan: Continue Flonase and benzonatate as prescribed by your PCP. We will add a prednisone taper over 5 days to see if this helps with your cough. Continue with your treatment for reflux, as well as behavioral therapies for cyclical cough. Follow up with Dr. Elsworth Soho as scheduled in April. Follow up in 8 weeks if cough is not improving. Please contact office for sooner follow up if symptoms do not improve or worsen or seek emergency care. Note to return to work Monday 05/12/2015.

## 2015-05-07 NOTE — Patient Instructions (Addendum)
For your cough try sips of water instead of clearing your throat. Continue to take your flonase as previously prescribed. Continue to use your benzonatate for cough as prescribed.  We will add a prednisone taper over 5 days to see if it helps. Try to rest your voice whenever possible. We will give you a note for work.  Follow up with Dr. Elsworth Soho in April as scheduled and ask him about allergy testing then. Follow up in 2 months if cough has not improved. Please contact office for  follow up sooner if symptoms do not improve or worsen or seek emergency care.

## 2015-05-07 NOTE — Assessment & Plan Note (Addendum)
Pt. Is hoarse due to cyclical chronic cough flare.  Plan: Rest voice as much as possible. Prednisone taper over 5 days. Try to take sips of water instead of constantly clearing your throat,  which  irritates the vocal cords. Follow up with office  if this does not improve as cough improves.

## 2015-05-07 NOTE — Progress Notes (Signed)
Subjective:    Patient ID: Sarah Salazar, female    DOB: 10-11-1955, 60 y.o.   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 She was recently seen by her PCP 04/21/15 for bronchitis. She was treated then with a Z-Pack, flonase, and benzonatate. She has improved, is afebrile, but cough is persistent.   Significant Studies  10/2014: Spirometry: no evidence of airway obstruction with a ratio 82, FEV1 87% and FVC 84% 10/16/14: IgE: 23 kU/L.  10/29/14 Sleep Study: Mild OSA, 11 events per hour.  05/07/15 ROV  for flare of chronic cough and laryngitis, which is interfering with her ability to perform her job function in a call center.  Her chronic cough is felt to be secondary to the upper airway cough syndrome & reflux disease. She has been  treated aggressively for reflux, as well as behavioral therapies for cyclical coughing. She has occasional breakthrough with strong odors. She reports 2 bouts in the spring of 2016 with hoarseness of voice, was placed on Qvar and albuterol with some relief. She continues to take the Qvar and albuterol as needed. Granddaughter has RSV at present , so she has had a viral exposure  Review of Systems Constitutional:   No  weight loss, night sweats,  Fevers, chills, fatigue, or  lassitude.  HEENT:   No headaches,  Difficulty swallowing,  Tooth/dental problems, or  Sore throat,                No sneezing, itching, ear ache, nasal congestion, scant post nasal drip,   CV:  No chest pain,  Orthopnea, PND, swelling in lower extremities, anasarca, dizziness, palpitations, syncope.   GI  No heartburn, indigestion, abdominal pain, nausea, vomiting, diarrhea, change in bowel habits, loss of appetite, bloody stools.   Resp: No shortness of breath with exertion or at rest.  No excess mucus, no productive cough,        + non-productive cough and  coughing so hard she vomits at times,  No coughing up of blood.  No change in color of mucus.  No wheezing.  No chest wall deformity  Skin: no rash or lesions.  GU: no dysuria, change in color of urine, no urgency or frequency.  No flank pain, no hematuria   MS:  No joint pain or swelling.  No decreased range of motion.  No back pain.  Psych:  No change in mood or affect. No depression or anxiety.  No memory loss. talkative   Past Medical History  Diagnosis Date  . IBS (irritable bowel syndrome)   . Anxiety   . Depression   . Hypothyroidism     hypothyroidism  . Chest pain     Emergency room April 23, 2012, troponin is normal, chest CT scan showed no pulmonary embolus  . Cough     Chronic cough  . Ejection fraction     EF 55-60%, echo, December, 2013, lipomatous hypertrophy of the atrial septum  . GERD (gastroesophageal reflux disease)   . Personal history of failed moderate sedation 06/20/2012  . Personal history of colonic adenoma 06/28/2012  . Complication of anesthesia     "I'm allergic to versed and fentanyl"  . Family history of anesthesia complication     "hard time waking daughter up post SVT ablation"  . Pneumonia     "couple times" (08/01/2013)  .  H/O hiatal hernia   . Migraine     "once or twice" (08/01/2013)  . Bulging lumbar disc     "L3-5" (08/01/2013)  . Bipolar disorder ([place])     Current outpatient prescriptions:  .  albuterol (PROAIR HFA) 108 (90 BASE) MCG/ACT inhaler, Inhale 2 puffs into the lungs every 6 (six) hours as needed for wheezing or shortness of breath., Disp: 1 Inhaler, Rfl: 2 .  ALPRAZolam (XANAX) 1 MG tablet, Take 1 tablet by mouth as needed., Disp: , Rfl:  .  beclomethasone (QVAR) 40 MCG/ACT inhaler, Inhale 2 puffs into the lungs 2 (two) times daily., Disp: 1 Inhaler, Rfl: 12 .  benzonatate (TESSALON) 100 MG capsule, Take 1 capsule (100 mg total) by mouth 3 (three) times daily as needed., Disp: 21 capsule, Rfl: 0 .  buPROPion (WELLBUTRIN) 100  MG tablet, Take 100 mg by mouth at bedtime., Disp: , Rfl:  .  fluticasone (FLONASE) 50 MCG/ACT nasal spray, Place 2 sprays into both nostrils daily., Disp: 16 g, Rfl: 2 .  hyoscyamine (LEVSIN SL) 0.125 MG SL tablet, Place 1 tablet (0.125 mg total) under the tongue every 4 (four) hours as needed., Disp: 30 tablet, Rfl: 0 .  lamoTRIgine (LAMICTAL) 200 MG tablet, Take 1 tablet by mouth daily., Disp: , Rfl:  .  lipase/protease/amylase (CREON) 36000 UNITS CPEP capsule, Take 1 with meals and one with a snack, Disp: 150 capsule, Rfl: 0 .  metaxalone (SKELAXIN) 800 MG tablet, , Disp: , Rfl:  .  metoCLOPramide (REGLAN) 5 MG tablet, 3 tabs po tid, Disp: 90 tablet, Rfl: 0 .  metoprolol tartrate (LOPRESSOR) 25 MG tablet, Take 0.5 tablets (12.5 mg total) by mouth 2 (two) times daily. (Patient taking differently: Take 25 mg by mouth 2 (two) times daily. ), Disp: 180 tablet, Rfl: 1 .  ondansetron (ZOFRAN) 4 MG tablet, Take 1 tablet (4 mg total) by mouth every 6 (six) hours as needed for nausea or vomiting., Disp: 60 tablet, Rfl: 1 .  pantoprazole (PROTONIX) 40 MG tablet, TAKE 1 TABLET DAILY, Disp: 90 tablet, Rfl: 1 .  promethazine (PHENERGAN) 25 MG tablet, Take 1 tablet (25 mg total) by mouth every 6 (six) hours as needed for nausea or vomiting., Disp: 40 tablet, Rfl: 0 .  ranitidine (ZANTAC) 150 MG capsule, Take 1 capsule (150 mg total) by mouth 2 (two) times daily., Disp: 60 capsule, Rfl: 0 .  traMADol (ULTRAM) 50 MG tablet, Take 1 tablet (50 mg total) by mouth every 6 (six) hours as needed., Disp: 30 tablet, Rfl: 0 .  predniSONE (DELTASONE) 10 MG tablet, 40mg  x1 day, then 30mg  x 1 day, then 20mg  x 1 day, then 10mg  daily x 1 day, then 5mg  x 1 day and stop., Disp: 11 tablet, Rfl: 0 .  thyroid (ARMOUR THYROID) 90 MG tablet, take 1 tablet 5 days a week and half tablet twice a week such as Sunday and Wednesday (Patient not taking: Reported on 05/07/2015), Disp: 30 tablet, Rfl: 2    Objective:   Physical Exam   BP  128/82 mmHg  Pulse 67  Temp(Src) 98.4 F (36.9 C) (Oral)  Ht 5\' 4"  (1.626 m)  Wt 191 lb 3.2 oz (86.728 kg)  BMI 32.80 kg/m2  SpO2 97%  Physical Exam:  General- No distress, obese, A&Ox3,pleasant  ENT: No sinus tenderness, TM clear, pale nasal mucosa, no oral exudate,scant post nasal drip, no LAN, frequent throat clearing. Cardiac: S1, S2, regular rate and rhythm, no murmur Chest: No wheeze/ rales/  dullness; no accessory muscle use, no nasal flaring, no sternal retractions Abd.: Soft Non-tender Ext: No edema Neuro:  normal strength Skin: No rashes, warm and dry Psych: normal mood and behavior, very talkative       Assessment & Plan:

## 2015-05-11 NOTE — Progress Notes (Signed)
Reviewed & agree with plan  

## 2015-05-21 ENCOUNTER — Other Ambulatory Visit: Payer: Self-pay | Admitting: *Deleted

## 2015-05-21 MED ORDER — THYROID 90 MG PO TABS: ORAL_TABLET | ORAL | Status: DC

## 2015-05-22 ENCOUNTER — Ambulatory Visit (INDEPENDENT_AMBULATORY_CARE_PROVIDER_SITE_OTHER): Payer: 59 | Admitting: Medical

## 2015-05-22 ENCOUNTER — Telehealth: Payer: Self-pay | Admitting: Family Medicine

## 2015-05-22 ENCOUNTER — Telehealth: Payer: Self-pay | Admitting: Internal Medicine

## 2015-05-22 ENCOUNTER — Encounter: Payer: Self-pay | Admitting: Medical

## 2015-05-22 VITALS — BP 120/80 | HR 78 | Temp 98.1°F | Ht 64.0 in | Wt 190.6 lb

## 2015-05-22 DIAGNOSIS — R112 Nausea with vomiting, unspecified: Secondary | ICD-10-CM

## 2015-05-22 DIAGNOSIS — R1013 Epigastric pain: Secondary | ICD-10-CM | POA: Diagnosis not present

## 2015-05-22 MED ORDER — METOCLOPRAMIDE HCL 5 MG PO TABS: ORAL_TABLET | ORAL | Status: DC

## 2015-05-22 MED FILL — METOCLOPRAMIDE 5 MG TABLET: 5 | 7 days supply | Qty: 60 | Fill #0

## 2015-05-22 NOTE — Patient Instructions (Addendum)
For your abdomen discomfort continue with treatment prescribed by GI.  Keep appointment on Jun 10, 2015 with GI.  I am not going to do labs or imaging studies today since your described symptoms are similar to your prior symptoms. If symptoms change or worsen then ED evaluation.  You state when using reglan you felt improvement of symptoms. I will give you rx of regalan but want you to be fully aware of benefits vs possible permanent side effect of tardive dyskinesia. I only want you to take reglan further after thinking carefully about this.  I will try to send interoffice note to Dr. Carlean Purl about your condition.  Follow up with [place] as needed after you see GI. Sooner if needed

## 2015-05-22 NOTE — Telephone Encounter (Signed)
Caller name: [facility]  Can be reached: [phone] Fax Number [phone]   E-Scribe: [address] [place] [address]  Reason for call: buPROPion [employer]) 100 MG tablet [id number] and lamoTRIgine (LAMICTAL) 200 MG tablet [id number]

## 2015-05-22 NOTE — Telephone Encounter (Signed)
Patient reports that she had some abdominal swelling and an episode of vomiting this am.  She reports that she has been doing well until last week.  She is advised to start back on a clear liquid diet until nausea is gone, then can slowly advance her diet.  She is advised that she needs to use her zofran as needed during the day and take phenergan in the evenings while at home.  She is advised to keep her follow up for 06/10/15 with Dr. Carlean Purl

## 2015-05-22 NOTE — Progress Notes (Signed)
Pre visit review using our clinic review tool, if applicable. No additional management support is needed unless otherwise documented below in the visit note. 

## 2015-05-22 NOTE — Telephone Encounter (Signed)
Left message for patient to call back  

## 2015-05-22 NOTE — Progress Notes (Signed)
Subjective:    Patient ID: Sarah Salazar, female    DOB: 11-18-1955, 60 y.o.   MRN: [id number]  HPI  Pt has update me on her symptoms. She basically describes nausea and bloating sensation.She describes some dry heave sensation intermmitent. Pt describes and agrees current symptoms are very similar as before. She has been seeing GI and feels no definitive cause found and meds not resolving symptoms completely.  Pt mentioned that she flare of her symptoms late last week.   Pt in past she associated improvement of symptoms with reglan.   Please see my prior note and I reviewed prior GI notes.  Also I did review pt endscopy. Pt has appointment with Dr. Carlean Purl February 7th.   Pt on protinix, hycosamine and pancrease enzymes.  Pt not sure if having studies for gastroparesis in near future or MRI and mrcp to evaluate biliary tract.    Review of Systems  Constitutional: Negative for chills and fatigue.  HENT: Negative for congestion, ear discharge and ear pain.   Cardiovascular: Negative for chest pain and palpitations.  Gastrointestinal: Positive for abdominal pain.       Bloating epigastric tenderness and nausea.  Skin: Negative for pallor.  Neurological: Negative for dizziness and headaches.  Hematological: Negative for adenopathy. Does not bruise/bleed easily.  Psychiatric/Behavioral: Negative for confusion and agitation.    Past Medical History  Diagnosis Date  . IBS (irritable bowel syndrome)   . Anxiety   . Depression   . Hypothyroidism     hypothyroidism  . Chest pain     Emergency room April 23, 2012, troponin is normal, chest CT scan showed no pulmonary embolus  . Cough     Chronic cough  . Ejection fraction     EF 55-60%, echo, December, 2013, lipomatous hypertrophy of the atrial septum  . GERD (gastroesophageal reflux disease)   . Personal history of failed moderate sedation 06/20/2012  . Personal history of colonic adenoma 06/28/2012  . Complication of  anesthesia     "I'm allergic to versed and fentanyl"  . Family history of anesthesia complication     "hard time waking daughter up post SVT ablation"  . Pneumonia     "couple times" (08/01/2013)  . H/O hiatal hernia   . Migraine     "once or twice" (08/01/2013)  . Bulging lumbar disc     "L3-5" (08/01/2013)  . Bipolar disorder [facility])     Social History   Social History  . Marital Status: Divorced    Spouse Name: N/A  . Number of Children: 1  . Years of Education: N/A   Occupational History  .     Social History Main Topics  . Smoking status: Never Smoker   . Smokeless tobacco: Never Used  . Alcohol Use: Yes     Comment: 08/01/2013 "glass of wine 6 times/year at most"  . Drug Use: No  . Sexual Activity: Yes   Other Topics Concern  . 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 Cholecystectomy    . Dilation and curettage of uterus  X 3  . Knee arthroscopy Right X 2  . Wrist surgery  1979-~ 2010    X 4  . Laparoscopy  1970's - 06/03/1990    "  several; to evaluate dysfunctional menses & pelvic pain"  . Cystocele repair  01/13/2009  . Esophagogastroduodenoscopy (egd) with esophageal dilation  2004; 2014  . Colonoscopy  2002  . Elbow surgery Left 2005  . Breast lumpectomy Left     "1st breast OR"  . Mastectomy, partial Left     "2nd breast OR", benign fibrocystic changes  . Wisdom tooth extraction      "all 4 at once"    Family History  Problem Relation Age of Onset  . Coronary artery disease Mother   . Stroke Mother     TIA  . Uterine cancer Mother     BREAST  . Thyroid disease Mother     goiter  . Lung cancer Father   . Heart disease Father   . COPD Father   . Coronary artery disease Father   . Colon cancer Paternal Uncle     COLON; STOMACH  . Lung cancer Maternal Grandfather   . Rheum arthritis  Maternal Aunt   . Supraventricular tachycardia Daughter     S/P ablation  . Thyroid disease Cousin     Allergies  Allergen Reactions  . Amoxicillin Rash       . Fentanyl Rash    given with Versed  . Midazolam Rash    given with Fentanyl  . Oxycodone-Aspirin Rash  . Sulfonamide Derivatives Rash    rash  . Hydrocodone Hives and Nausea And Vomiting  . Nalbuphine Nausea And Vomiting  . Oxycodone-Acetaminophen Hives and Nausea And Vomiting  . Venlafaxine Other (See Comments)    Patient says makes her crazy  . Verapamil Palpitations    Caused "pounding " in chest    Current Outpatient Prescriptions on File Prior to Visit  Medication Sig Dispense Refill  . albuterol (PROAIR HFA) 108 (90 BASE) MCG/ACT inhaler Inhale 2 puffs into the lungs every 6 (six) hours as needed for wheezing or shortness of breath. 1 Inhaler 2  . ALPRAZolam (XANAX) 1 MG tablet Take 1 tablet by mouth as needed.    . beclomethasone (QVAR) 40 MCG/ACT inhaler Inhale 2 puffs into the lungs 2 (two) times daily. 1 Inhaler 12  . buPROPion (WELLBUTRIN) 100 MG tablet Take 100 mg by mouth at bedtime.    . fluticasone (FLONASE) 50 MCG/ACT nasal spray Place 2 sprays into both nostrils daily. 16 g 2  . hyoscyamine (LEVSIN SL) 0.125 MG SL tablet Place 1 tablet (0.125 mg total) under the tongue every 4 (four) hours as needed. 30 tablet 0  . lamoTRIgine (LAMICTAL) 200 MG tablet Take 1 tablet by mouth daily.    . lipase/protease/amylase (CREON) 36000 UNITS CPEP capsule Take 1 with meals and one with a snack 150 capsule 0  . metaxalone (SKELAXIN) 800 MG tablet     . metoCLOPramide (REGLAN) 5 MG tablet 3 tabs po tid 90 tablet 0  . metoprolol tartrate (LOPRESSOR) 25 MG tablet Take 0.5 tablets (12.5 mg total) by mouth 2 (two) times daily. (Patient taking differently: Take 25 mg by mouth 2 (two) times daily. ) 180 tablet 1  . ondansetron (ZOFRAN) 4 MG tablet Take 1 tablet (4 mg total) by mouth every 6 (six) hours as needed for nausea  or vomiting. 60 tablet 1  . pantoprazole (PROTONIX) 40 MG tablet TAKE 1 TABLET DAILY 90 tablet 1  . promethazine (PHENERGAN) 25 MG tablet Take 1 tablet (25 mg total) by mouth every 6 (six) hours as needed for nausea or vomiting. 40 tablet 0  .  ranitidine (ZANTAC) 150 MG capsule Take 1 capsule (150 mg total) by mouth 2 (two) times daily. 60 capsule 0  . thyroid (ARMOUR THYROID) 90 MG tablet Take 1/2 tablet daily 45 tablet 1  . traMADol (ULTRAM) 50 MG tablet Take 1 tablet (50 mg total) by mouth every 6 (six) hours as needed. 30 tablet 0   No current facility-administered medications on file prior to visit.    BP 120/80 mmHg  Pulse 78  Temp(Src) 98.1 F (36.7 C) (Oral)  Ht 5\' 4"  (1.626 m)  Wt 190 lb 9.6 oz (86.456 kg)  BMI 32.70 kg/m2  SpO2 98%       Objective:   Physical Exam  General Appearance- Not in acute distress.  HEENT Eyes- Scleraeral/Conjuntiva-bilat- Not Yellow. Mouth & Throat- Normal.  Chest and Lung Exam Auscultation: Breath sounds:-Normal. Adventitious sounds:- No Adventitious sounds.  Cardiovascular Auscultation:Rythm - Regular. Heart Sounds -Normal heart sounds.  Abdomen Inspection:-Inspection Normal.  Palpation/Perucssion: Palpation and Percussion of the abdomen reveal- faint epigastric  Tender, No Rebound tenderness, No rigidity(Guarding) and No Palpable abdominal masses.  Liver:-Normal.  Spleen:- Normal.   Back- no cva tenderness.  .      Assessment & Plan:  For your abdomen discomfort continue with treatment prescribed by GI.  Keep appointment on Jun 10, 2015 with GI.  I am not going to do labs or imaging studies today since your described symptoms are similar to your prior symptoms. If symptoms change or worsen then ED evaluation.  You state when using reglan you felt improvement of symptoms. I will give you rx of regalan but want you to be fully aware of benefits vs possible permanent side effect of tardive dyskinesia. I only want you to  take reglan further after thinking carefully about this.  I will try to send interoffice note to Dr. Carlean Purl about your condition.  Follow up with [place] as needed after you see GI. Sooner if needed

## 2015-05-22 NOTE — Telephone Encounter (Signed)
Dr. Carlean Purl, Pt is going to see you on Jun 10, 2015. I have seen pt various times for her abdominal discomfort. Last couple of times pt has mentioned she had understanding she was going to get MRI  And MRCP to evaluate her condition. I wanted to make you aware of this as she may bring this up when you see her.   Thanks for your help with our patients,  Mackie Pai PA-C

## 2015-05-22 NOTE — Telephone Encounter (Signed)
Thanks for the note. I have not thought her symptoms were from biliary tract so did not order that. Will see what shakes out at follow up

## 2015-05-22 NOTE — Telephone Encounter (Signed)
Please advise if you would fill these med's?   KP

## 2015-06-10 ENCOUNTER — Encounter: Payer: Self-pay | Admitting: Internal Medicine

## 2015-06-10 ENCOUNTER — Ambulatory Visit (INDEPENDENT_AMBULATORY_CARE_PROVIDER_SITE_OTHER): Payer: 59 | Admitting: Internal Medicine

## 2015-06-10 VITALS — BP 152/88 | HR 72 | Ht 65.0 in | Wt 192.1 lb

## 2015-06-10 DIAGNOSIS — K589 Irritable bowel syndrome without diarrhea: Secondary | ICD-10-CM

## 2015-06-10 DIAGNOSIS — K30 Functional dyspepsia: Secondary | ICD-10-CM | POA: Diagnosis not present

## 2015-06-10 DIAGNOSIS — R1010 Upper abdominal pain, unspecified: Secondary | ICD-10-CM | POA: Diagnosis not present

## 2015-06-10 DIAGNOSIS — R110 Nausea: Secondary | ICD-10-CM

## 2015-06-10 DIAGNOSIS — R140 Abdominal distension (gaseous): Secondary | ICD-10-CM

## 2015-06-10 MED ORDER — PROMETHAZINE HCL 25 MG PO TABS: 25.0000 mg | ORAL_TABLET | Freq: Four times a day (QID) | ORAL | Status: DC | PRN

## 2015-06-10 MED ORDER — HYOSCYAMINE SULFATE 0.125 MG SL SUBL: 0.1250 mg | SUBLINGUAL_TABLET | SUBLINGUAL | Status: DC | PRN

## 2015-06-10 NOTE — Patient Instructions (Addendum)
  You have been scheduled for a gastric emptying scan at [facility] on 06/18/15 at 9:00AM. Please arrive at least 15 minutes prior to your appointment for registration. Please make certain not to have anything to eat or drink after midnight the night before your test. Hold all stomach medications (ex: Zofran, phenergan, Reglan) 48 hours prior to your test. If you need to reschedule your appointment, please contact radiology scheduling at [phone]. _____________________________________________________________________ A gastric-emptying study measures how long it takes for food to move through your stomach. There are several ways to measure stomach emptying. In the most common test, you eat food that contains a small amount of radioactive material. A scanner that detects the movement of the radioactive material is placed over your abdomen to monitor the rate at which food leaves your stomach.     We have sent the following prescriptions to your mail in pharmacy: Generic Levsin, Phenergan  If you have not heard from your mail in pharmacy within 1 week or if you have not received your medication in the mail, please contact us at [phone] so we may find out why.    I appreciate the opportunity to care for you. Silvano Rusk, MD, [facility]

## 2015-06-10 NOTE — Progress Notes (Signed)
Subjective:    Patient ID: Sarah Salazar, female    DOB: 1955-09-09, 60 y.o.   MRN: [id number] Cc: abdominal pain and bloating HPI the patient is here for follow-up. She is a 9 year history of upper abdominal pain she says bandlike in the epigastrium and right upper quadrant that is sometimes severe. She relates it to starting after she had a cholecystectomy. The pain she had cholecystectomy for was different and she clearly relates a history of gallstones. What she has is a lot of intermittent bloating, unpredictable and no clear triggers. Then she gets severe spells where she has terrible pain that radiated to her back, it's in the epigastrium and right upper quadrant and then she bloats the point where she looks like she's 9 months pregnant. Bowel habits are not particular he affected by this though she has chronic intermittent episodic unloading of multiple loose stools and a known diagnosis of IBS. Though she realizes it's unlikely to be cancer that is in the back of her mind at times. In the past year or so she's had ultrasound, CT scan abdomen and pelvis with contrast labs including LFTs and pancreatic tests, and an EGD all of which been unrevealing. Her from working at times. She has some history of headaches but no clear history of chronic migraines. No medicine has particularly helped her, we've tried her on PPI which she still on, she has tried Creon, she's been on metoclopramide. Hyoscyamine takes the edge off but even a 0.125 mg dose makes her very sleepy. She tends to have a lot of nausea with the spells and dry heaves more so than vomiting Wt Readings from Last 3 Encounters:  06/10/15 192 lb 2 oz (87.147 kg)  05/22/15 190 lb 9.6 oz (86.456 kg)  05/07/15 191 lb 3.2 oz (86.728 kg)  Medications, allergies, past medical history, past surgical history, family history and social history are reviewed and updated in the EMR.   Review of Systems Says her bipolar symptoms are under good control.  Not seeing a psychiatrist anymore is getting these refilled through primary care.    Objective:   Physical Exam BP 152/88 mmHg  Pulse 72  Ht 5\' 5"  (1.651 m)  Wt 192 lb 2 oz (87.147 kg)  BMI 31.97 kg/m2 No acute distress       Assessment & Plan:   1. Functional dyspepsia   2. IBS (irritable bowel syndrome)   3. Bloating   4. Upper abdominal pain   5. Nausea    I explained to her today that with the imaging studies we have and the labs and the overall history that I do not think an MRI or an MRCP would be helpful and that's why I did not follow through with that as previously ordered by my PA. I do think she has some sort of functional gut disorder. I don't think she has overt gastric paracystic but gastric dysfunction is certainly possible. I'm going to order a 4 hour gastric emptying study to see if that may help. She will continue with promethazine when necessary as Zofran does not provide relief. Hyoscyamine will be used as needed as well.  I explained to her that treatment of symptoms is going to be most likely course here other than significant for a's and other testing at least at my direction. In less to gastric emptying study showed some sort of light on this the changes what we do, I'm inclined to refer her for a tertiary opinion.  One option would be to try buspirone since that does help with functional dyspepsia and no type of symptom she has particularly the early satiety and/or bloating-like feeling. It's a little bit difficult using SSRIs or SNR eyes and her with the fact that she is already on Lamictal and bupropion. We had a discussion today and I don't think that she's having side effects from her medications that she related a history of having problems related to Depo Provera in the past.   25 minutes time spent with patient > half in counseling coordination of care

## 2015-06-18 ENCOUNTER — Ambulatory Visit (HOSPITAL_COMMUNITY)
Admission: RE | Admit: 2015-06-18 | Discharge: 2015-06-18 | Disposition: A | Discharge status: Home / Self Care | Payer: 59 | Source: Ambulatory Visit | Attending: Internal Medicine | Admitting: Internal Medicine

## 2015-06-18 ENCOUNTER — Telehealth: Payer: Self-pay

## 2015-06-18 DIAGNOSIS — R1011 Right upper quadrant pain: Secondary | ICD-10-CM | POA: Diagnosis not present

## 2015-06-18 DIAGNOSIS — R140 Abdominal distension (gaseous): Secondary | ICD-10-CM | POA: Diagnosis present

## 2015-06-18 DIAGNOSIS — R110 Nausea: Secondary | ICD-10-CM | POA: Diagnosis not present

## 2015-06-18 MED ORDER — TECHNETIUM TC 99M SULFUR COLLOID
2.1000 | Freq: Once | INTRAVENOUS | Status: AC | PRN
  Administered 2015-06-18: 2.1 via ORAL

## 2015-06-18 NOTE — Telephone Encounter (Signed)
-----   Message from Gatha Mayer, MD sent at 06/18/2015  2:43 PM EST ----- Regarding: Gastric emptying Normal study.  Please haver start buspirone 10 MG bid dispense 60 and refill x 5. If not better by April call back

## 2015-06-18 NOTE — Telephone Encounter (Signed)
Left message for patient to call back  

## 2015-06-19 MED ORDER — BUSPIRONE HCL 10 MG PO TABS: 10.0000 mg | ORAL_TABLET | Freq: Two times a day (BID) | ORAL | Status: DC

## 2015-06-19 NOTE — Telephone Encounter (Signed)
Patient notified of results and recommendations.

## 2015-06-19 NOTE — Telephone Encounter (Signed)
Says she can not talk during work hours which is until 5:45pm. Was not happy she could not talk to nurse right now before she started work. I asked her if she could call back maybe during her break or lunch but she is not sure right now.

## 2015-07-10 ENCOUNTER — Other Ambulatory Visit (INDEPENDENT_AMBULATORY_CARE_PROVIDER_SITE_OTHER): Payer: 59

## 2015-07-10 DIAGNOSIS — E063 Autoimmune thyroiditis: Secondary | ICD-10-CM

## 2015-07-10 DIAGNOSIS — E038 Other specified hypothyroidism: Secondary | ICD-10-CM

## 2015-07-10 LAB — TSH: TSH: 4.41 u[IU]/mL (ref 0.35–4.50)

## 2015-07-15 ENCOUNTER — Encounter: Payer: Self-pay | Admitting: Endocrinology

## 2015-07-15 ENCOUNTER — Ambulatory Visit (INDEPENDENT_AMBULATORY_CARE_PROVIDER_SITE_OTHER): Payer: 59 | Admitting: Endocrinology

## 2015-07-15 VITALS — BP 134/78 | HR 60 | Temp 98.1°F | Resp 14 | Ht 65.0 in | Wt 193.4 lb

## 2015-07-15 DIAGNOSIS — E038 Other specified hypothyroidism: Secondary | ICD-10-CM

## 2015-07-15 DIAGNOSIS — E063 Autoimmune thyroiditis: Secondary | ICD-10-CM

## 2015-07-15 NOTE — Progress Notes (Signed)
Patient ID: Sarah Salazar, female   DOB: 1955/05/29, 60 y.o.   MRN: [id number]            Reason for Appointment:  Hypothyroidism, follow-up visit    History of Present Illness:   The Hypothyroidism was first diagnosed in the year 2000  No records are available from the time of her initial diagnosis but patient reports that she had gained a very significant amount of weight gain over about 2 months and was having some problems with fatigue and increased sweating. She was told that her thyroid level was borderline only but started her on thyroid supplement Apparently with taking the thyroid supplement her weight went down to normal and she started feeling better with her energy Her TSH levels have been consistently normal since 2007 except low in 6/16   Previously since 2012 was on generic levothyroxine, 100 g. Because of her complaining of significant fatigue, sleepiness and depression she was given a trial of Armour Thyroid in 11/16 She thinks she felt better with this with her energy level. However even with her not taking the medication for 2 weeks her TSH was only 3.6 on her last visit in January  She was told to take only 45 mg of Armour Thyroid tablet since 1/17 With this she does not feel any different, continues to have fatigue but she thinks this is from her busy lifestyle She has not had any significant weight change Also complains of cold intolerance when the weather is cold.  She takes her thyroid supplement regularly in the mornings  Her TSH is minimally higher at 4.4  Wt Readings from Last 3 Encounters:  07/15/15 193 lb 6.4 oz (87.726 kg)  06/10/15 192 lb 2 oz (87.147 kg)  05/22/15 190 lb 9.6 oz (86.456 kg)       Lab Results  Component Value Date   TSH 4.41 07/10/2015   TSH 3.64 05/06/2015   TSH 0.12* 04/15/2015   FREET4 0.74 04/15/2015   FREET4 1.26 03/07/2015   FREET4 0.97 10/22/2014       Past Medical History  Diagnosis Date  . IBS (irritable  bowel syndrome)   . Anxiety   . Depression   . Hypothyroidism     hypothyroidism  . Chest pain     Emergency room April 23, 2012, troponin is normal, chest CT scan showed no pulmonary embolus  . Cough     Chronic cough  . Ejection fraction     EF 55-60%, echo, December, 2013, lipomatous hypertrophy of the atrial septum  . GERD (gastroesophageal reflux disease)   . Personal history of failed moderate sedation 06/20/2012  . Personal history of colonic adenoma 06/28/2012  . Complication of anesthesia     "I'm allergic to versed and fentanyl"  . Family history of anesthesia complication     "hard time waking daughter up post SVT ablation"  . Pneumonia     "couple times" (08/01/2013)  . H/O hiatal hernia   . Migraine     "once or twice" (08/01/2013)  . Bulging lumbar disc     "L3-5" (08/01/2013)  . Bipolar disorder [facility])     Past Surgical History  Procedure Laterality Date  . Cholecystectomy    . Dilation and curettage of uterus  X 3  . Knee arthroscopy Right X 2  . Wrist surgery  1979-~ 2010    X 4  . Laparoscopy  1970's - 06/03/1990    "several; to evaluate dysfunctional menses & pelvic  pain"  . Cystocele repair  01/13/2009  . Esophagogastroduodenoscopy (egd) with esophageal dilation  2004; 2014  . Colonoscopy  2002  . Elbow surgery Left 2005  . Breast lumpectomy Left     "1st breast OR"  . Mastectomy, partial Left     "2nd breast OR", benign fibrocystic changes  . Wisdom tooth extraction      "all 4 at once"    Family History  Problem Relation Age of Onset  . Coronary artery disease Mother   . Stroke Mother     TIA  . Uterine cancer Mother     BREAST  . Thyroid disease Mother     goiter  . Lung cancer Father   . Heart disease Father   . COPD Father   . Coronary artery disease Father   . Colon cancer Paternal Uncle     COLON; STOMACH  . Lung cancer Maternal Grandfather   . Rheum arthritis Maternal Aunt   . Supraventricular tachycardia Daughter     S/P ablation    . Thyroid disease Cousin     Social History:  reports that she has never smoked. She has never used smokeless tobacco. She reports that she drinks alcohol. She reports that she does not use illicit drugs.  Allergies:  Allergies  Allergen Reactions  . Amoxicillin Rash       . Fentanyl Rash    given with Versed  . Midazolam Rash    given with Fentanyl  . Oxycodone-Aspirin Rash  . Sulfonamide Derivatives Rash    rash  . Hydrocodone Hives and Nausea And Vomiting  . Nalbuphine Nausea And Vomiting  . Oxycodone-Acetaminophen Hives and Nausea And Vomiting  . Venlafaxine Other (See Comments)    Patient says makes her crazy  . Verapamil Palpitations    Caused "pounding " in chest      Medication List       This list is accurate as of: 07/15/15  2:08 PM.  Always use your most recent med list.               albuterol 108 (90 Base) MCG/ACT inhaler  Commonly known as:  PROAIR HFA  Inhale 2 puffs into the lungs every 6 (six) hours as needed for wheezing or shortness of breath.     ALPRAZolam 1 MG tablet  Commonly known as:  XANAX  Take 1 tablet by mouth as needed.     beclomethasone 40 MCG/ACT inhaler  Commonly known as:  QVAR  Inhale 2 puffs into the lungs 2 (two) times daily.     buPROPion 100 MG tablet  Commonly known as:  WELLBUTRIN  Take 100 mg by mouth at bedtime.     busPIRone 10 MG tablet  Commonly known as:  BUSPAR  Take 1 tablet (10 mg total) by mouth 2 (two) times daily.     fluticasone 50 MCG/ACT nasal spray  Commonly known as:  FLONASE  Place 2 sprays into both nostrils daily.     hyoscyamine 0.125 MG SL tablet  Commonly known as:  LEVSIN SL  Place 1 tablet (0.125 mg total) under the tongue every 4 (four) hours as needed.     lamoTRIgine 200 MG tablet  Commonly known as:  LAMICTAL  Take 1 tablet by mouth daily.     metaxalone 800 MG tablet  Commonly known as:  SKELAXIN     metoprolol tartrate 25 MG tablet  Commonly known as:  LOPRESSOR  Take  0.5 tablets (12.5 mg  total) by mouth 2 (two) times daily.     pantoprazole 40 MG tablet  Commonly known as:  PROTONIX  TAKE 1 TABLET DAILY     promethazine 25 MG tablet  Commonly known as:  PHENERGAN  Take 1 tablet (25 mg total) by mouth every 6 (six) hours as needed for nausea or vomiting.     ranitidine 150 MG capsule  Commonly known as:  ZANTAC  Take 1 capsule (150 mg total) by mouth 2 (two) times daily.     thyroid 90 MG tablet  Commonly known as:  ARMOUR THYROID  Take 1/2 tablet daily     traMADol 50 MG tablet  Commonly known as:  ULTRAM  Take 1 tablet (50 mg total) by mouth every 6 (six) hours as needed.         ROS  She continues to have difficulty with fatigue, some insomnia and difficulty losing weight She does not find time for exercise because of her work schedule   Examination:    BP 134/78 mmHg  Pulse 60  Temp(Src) 98.1 F (36.7 C)  Resp 14  Ht 5\' 5"  (1.651 m)  Wt 193 lb 6.4 oz (87.726 kg)  BMI 32.18 kg/m2  SpO2 98%   Thyroid not palpable. Biceps reflexes appear normal Skin appears normal  Assessments   1. Hypothyroidism by history.   She previously was subjectively better with starting Armour Thyroid instead of levothyroxine Currently taking 45 mg daily She does have a lot of nonspecific fatigue related to various other issues; does complain of cold intolerance recently Her TSH is trending higher at 4.4  PLAN:     Continue half tablet of 90 mg but take 8 tablets a week  Check TSH again in 6 months  Encourage her to start exercise program with using a workout video at home or walking outside when able to  She'll discuss her other continuing problems with PCP     [facility] 07/15/2015, 2:08 PM

## 2015-07-15 NOTE — Patient Instructions (Signed)
Take 1 pill twice a week plus 1/2 on other days

## 2015-07-16 ENCOUNTER — Ambulatory Visit (INDEPENDENT_AMBULATORY_CARE_PROVIDER_SITE_OTHER): Payer: 59 | Admitting: Medical

## 2015-07-16 ENCOUNTER — Encounter: Payer: Self-pay | Admitting: Medical

## 2015-07-16 VITALS — BP 148/90 | HR 70 | Temp 98.8°F | Ht 65.0 in | Wt 192.4 lb

## 2015-07-16 DIAGNOSIS — J040 Acute laryngitis: Secondary | ICD-10-CM

## 2015-07-16 DIAGNOSIS — J3489 Other specified disorders of nose and nasal sinuses: Secondary | ICD-10-CM

## 2015-07-16 DIAGNOSIS — H109 Unspecified conjunctivitis: Secondary | ICD-10-CM | POA: Diagnosis not present

## 2015-07-16 DIAGNOSIS — R067 Sneezing: Secondary | ICD-10-CM

## 2015-07-16 DIAGNOSIS — J309 Allergic rhinitis, unspecified: Secondary | ICD-10-CM | POA: Diagnosis not present

## 2015-07-16 MED ORDER — FLUTICASONE PROPIONATE 50 MCG/ACT NA SUSP: 2.0000 | Freq: Every day | NASAL | Status: DC

## 2015-07-16 MED ORDER — AZITHROMYCIN 250 MG PO TABS: ORAL_TABLET | ORAL | Status: DC

## 2015-07-16 MED ORDER — BENZONATATE 100 MG PO CAPS: 100.0000 mg | ORAL_CAPSULE | Freq: Three times a day (TID) | ORAL | Status: DC | PRN

## 2015-07-16 MED ORDER — TOBRAMYCIN 0.3 % OP SOLN: 2.0000 [drp] | Freq: Four times a day (QID) | OPHTHALMIC | Status: DC

## 2015-07-16 MED FILL — FLUTICASONE PROP 50 MCG SPR: 50 | 30 days supply | Qty: 16 | Fill #0

## 2015-07-16 MED FILL — AZITHROMYCIN 250 MG TABLET: 250 | 5 days supply | Qty: 6 | Fill #0

## 2015-07-16 MED FILL — TOBRAMYCIN 0.3% EYE DROPS: 0.3 | 10 days supply | Qty: 5 | Fill #0

## 2015-07-16 MED FILL — BENZONATATE 100 MG CAPSULE: 100 | 6 days supply | Qty: 20 | Fill #0

## 2015-07-16 NOTE — Progress Notes (Signed)
Pre visit review using our clinic review tool, if applicable. No additional management support is needed unless otherwise documented below in the visit note. 

## 2015-07-16 NOTE — Patient Instructions (Signed)
For your laryngitis rest voice and hydrate. Work excuse for until Monday. Will go ahead and refer to ENT since you have laryngitis type symptoms on and off recurrent/frequent over past year.  For recent allergies, rx flonase.  For cough benzonatate.  For conjunctivitis rx tobrex.   If current sinus pressure worsens despite the flonase then start azithromycin.  Follow up in 7 days or as needed

## 2015-07-16 NOTE — Progress Notes (Signed)
Subjective:    Patient ID: Sarah Salazar, female    DOB: 1956-02-06, 60 y.o.   MRN: [id number]  HPI   Pt in with laryngitis. Started this morning. 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 No fever, or sweats. Some chills over past week. But her endocrnologist which she saw today thinks chills maybe related to her tsh level.  Pt has nasal congestion with some sneezing for one week. Mild matting to her eyes over last week as well.  Some cough that is beginning last 24 hours.  Pt is not a smoker.  No diffuse body aches.   Pt estimates 5 episodes of laryngitis over past year. 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    Review of Systems  Constitutional: Positive for chills. Negative for fever and fatigue.  HENT: Positive for congestion, sneezing and voice change. Negative for ear pain, postnasal drip, sinus pressure, sore throat and trouble swallowing.   Eyes: Positive for discharge.       Some daily past week. See hpi. But not presently.  Respiratory: Positive for cough. Negative for shortness of breath and wheezing.   Cardiovascular: Negative for chest pain and palpitations.  Gastrointestinal: Negative for nausea, abdominal pain and constipation.  Genitourinary: Negative for dysuria.  Musculoskeletal: Negative for myalgias and back pain.  Neurological: Negative for dizziness and headaches.  Hematological: Negative for adenopathy. Does not bruise/bleed easily.  Psychiatric/Behavioral: Negative for behavioral problems and confusion.    Past Medical History  Diagnosis Date  . IBS (irritable bowel syndrome)   . Anxiety   . Depression   . Hypothyroidism     hypothyroidism  . Chest pain     Emergency room April 23, 2012, troponin is normal, chest CT scan showed no pulmonary embolus  . Cough     Chronic cough  . Ejection fraction     EF 55-60%, echo, December, 2013, lipomatous hypertrophy of the atrial septum  . GERD  (gastroesophageal reflux disease)   . Personal history of failed moderate sedation 06/20/2012  . Personal history of colonic adenoma 06/28/2012  . Complication of anesthesia     "I'm allergic to versed and fentanyl"  . Family history of anesthesia complication     "hard time waking daughter up post SVT ablation"  . Pneumonia     "couple times" (08/01/2013)  . H/O hiatal hernia   . Migraine     "once or twice" (08/01/2013)  . Bulging lumbar disc     "L3-5" (08/01/2013)  . Bipolar disorder [facility])     Social History   Social History  . Marital Status: Divorced    Spouse Name: N/A  . Number of Children: 1  . Years of Education: N/A   Occupational History  .     Social History Main Topics  . Smoking status: Never Smoker   . Smokeless tobacco: Never Used  . Alcohol Use: Yes     Comment: 08/01/2013 "glass of wine 6 times/year at most"  . Drug Use: No  . Sexual Activity: Yes   Other Topics Concern  . 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 Cholecystectomy    . Dilation and curettage  of uterus  X 3  . Knee arthroscopy Right X 2  . Wrist surgery  1979-~ 2010    X 4  . Laparoscopy  1970's - 06/03/1990    "several; to evaluate dysfunctional menses & pelvic pain"  . Cystocele repair  01/13/2009  . Esophagogastroduodenoscopy (egd) with esophageal dilation  2004; 2014  . Colonoscopy  2002  . Elbow surgery Left 2005  . Breast lumpectomy Left     "1st breast OR"  . Mastectomy, partial Left     "2nd breast OR", benign fibrocystic changes  . Wisdom tooth extraction      "all 4 at once"    Family History  Problem Relation Age of Onset  . Coronary artery disease Mother   . Stroke Mother     TIA  . Uterine cancer Mother     BREAST  . Thyroid disease Mother     goiter  . Lung cancer Father   . Heart disease Father    . COPD Father   . Coronary artery disease Father   . Colon cancer Paternal Uncle     COLON; STOMACH  . Lung cancer Maternal Grandfather   . Rheum arthritis Maternal Aunt   . Supraventricular tachycardia Daughter     S/P ablation  . Thyroid disease Cousin     Allergies  Allergen Reactions  . Amoxicillin Rash       . Fentanyl Rash    given with Versed  . Midazolam Rash    given with Fentanyl  . Oxycodone-Aspirin Rash  . Sulfonamide Derivatives Rash    rash  . Hydrocodone Hives and Nausea And Vomiting  . Nalbuphine Nausea And Vomiting  . Oxycodone-Acetaminophen Hives and Nausea And Vomiting  . Venlafaxine Other (See Comments)    Patient says makes her crazy  . Verapamil Palpitations    Caused "pounding " in chest    Current Outpatient Prescriptions on File Prior to Visit  Medication Sig Dispense Refill  . albuterol (PROAIR HFA) 108 (90 BASE) MCG/ACT inhaler Inhale 2 puffs into the lungs every 6 (six) hours as needed for wheezing or shortness of breath. 1 Inhaler 2  . ALPRAZolam (XANAX) 1 MG tablet Take 1 tablet by mouth as needed.    . beclomethasone (QVAR) 40 MCG/ACT inhaler Inhale 2 puffs into the lungs 2 (two) times daily. (Patient taking differently: Inhale 2 puffs into the lungs as needed. ) 1 Inhaler 12  . buPROPion (WELLBUTRIN) 100 MG tablet Take 100 mg by mouth at bedtime.    . fluticasone (FLONASE) 50 MCG/ACT nasal spray Place 2 sprays into both nostrils daily. 16 g 2  . hyoscyamine (LEVSIN SL) 0.125 MG SL tablet Place 1 tablet (0.125 mg total) under the tongue every 4 (four) hours as needed. 90 tablet 3  . lamoTRIgine (LAMICTAL) 200 MG tablet Take 1 tablet by mouth daily.    . metoprolol tartrate (LOPRESSOR) 25 MG tablet Take 0.5 tablets (12.5 mg total) by mouth 2 (two) times daily. (Patient taking differently: Take 25 mg by mouth 2 (two) times daily. ) 180 tablet 1  . pantoprazole (PROTONIX) 40 MG tablet TAKE 1 TABLET DAILY 90 tablet 1  . promethazine (PHENERGAN)  25 MG tablet Take 1 tablet (25 mg total) by mouth every 6 (six) hours as needed for nausea or vomiting. 90 tablet 3  . ranitidine (ZANTAC) 150 MG capsule Take 1 capsule (150 mg total) by mouth 2 (two) times daily. (Patient taking differently: Take 150 mg by mouth as  needed. ) 60 capsule 0  . thyroid (ARMOUR THYROID) 90 MG tablet Take 1/2 tablet daily 45 tablet 1  . traMADol (ULTRAM) 50 MG tablet Take 1 tablet (50 mg total) by mouth every 6 (six) hours as needed. 30 tablet 0  . busPIRone (BUSPAR) 10 MG tablet Take 1 tablet (10 mg total) by mouth 2 (two) times daily. (Patient not taking: Reported on 07/16/2015) 60 tablet 5  . metaxalone (SKELAXIN) 800 MG tablet      No current facility-administered medications on file prior to visit.    BP 148/90 mmHg  Pulse 70  Temp(Src) 98.8 F (37.1 C) (Oral)  Ht 5\' 5"  (1.651 m)  Wt 192 lb 6.4 oz (87.272 kg)  BMI 32.02 kg/m2  SpO2 98%       Objective:   Physical Exam   General  Mental Status - Alert. General Appearance - Well groomed. Not in acute distress. Hoarse voice.  Skin Rashes- No Rashes.  HEENT Head- Normal. Ear Auditory Canal - Left- Normal. Right - Normal.Tympanic Membrane- Left- Normal. Right- Normal. Eye Sclera/Conjunctiva- mild injected and red conjunctiva but no dc.(bilateral) Nose & Sinuses Nasal Mucosa- Left-  Boggy and Congested. Right-  Boggy and  Congested.Bilateral  Faint maxillary and  Faint frontal sinus pressure. Mouth & Throat Lips: Upper Lip- Normal: no dryness, cracking, pallor, cyanosis, or vesicular eruption. Lower Lip-Normal: no dryness, cracking, pallor, cyanosis or vesicular eruption. Buccal Mucosa- Bilateral- No Aphthous ulcers. Oropharynx- No Discharge or Erythema. +pnd Tonsils: Characteristics- Bilateral- No Erythema or Congestion. Size/Enlargement- Bilateral- No enlargement. Discharge- bilateral-None.  Neck Neck- Supple. No Masses.   Chest and Lung Exam Auscultation: Breath Sounds:-Clear even and  unlabored.  Cardiovascular Auscultation:Rythm- Regular, rate and rhythm. Murmurs & Other Heart Sounds:Ausculatation of the heart reveal- No Murmurs.  Lymphatic Head & Neck General Head & Neck Lymphatics: Bilateral: Description- No Localized lymphadenopathy.      Assessment & Plan:  For your laryngitis rest voice and hydrate. Work excuse for until Monday. Will go ahead and refer to ENT since you have laryngitis type symptoms on and off recurrent/frequent over past year.  For recent allergies, rx flonase.  For cough benzonatate.  For conjunctivitis rx tobrex.   If current sinus pressure worsens despite the flonase then start azithromycin.  Follow up in 7 days or as needed

## 2015-07-21 ENCOUNTER — Telehealth: Payer: Self-pay

## 2015-07-21 NOTE — Telephone Encounter (Signed)
[employer] note received via fax 07/20/15 at 4:26pm  Call   Date: 07/20/15 Time: 4:17:31pm  Caller:  Patient Return number:  [phone]  Nurse: Prescilla Sours, RN  Chief Complaint:  Cold Symptoms  Reason for call:  Caller saw her physician Wednesday has laryngitis and some nasal drip.  Caller was given antibiotic and instructed to take it if she got worse.  Caller wonders if she should be taking the antibiotic.  Caller says she is not any worse but her laryngitis is off and on.  Caller says she is not running a fever, no cough.    Related visit to physician within the last 2 weeks:  yes  Guideline: n/a  Disposition:  Clinical call  Comments:  "Nurse informed caller that I could not make the decision as to whether or not she should take the antibiotic.  Nurse informed caller she has to make that decision based on how she is feeling.  Caller says she is not any worse and not any better."   Please advise.

## 2015-07-21 NOTE — Telephone Encounter (Signed)
Pt states that she was seen on 07/16/15 and still is having some laryngitis. She wants to know if you will extend the note until the end of the week and if you could electronically so she can print off computer. Please advise.

## 2015-07-21 NOTE — Telephone Encounter (Signed)
I got message. Take antibiotic. If laryngitis persist despite the treatment let us know and will refer to ent.

## 2015-07-22 NOTE — Telephone Encounter (Signed)
Patient requesting a Dr. Note excusing her from work from Monday, March 20th to Monday, March 27th patient would like to print thru My Chart if that is not possible patient would like to pick up dr. Note. Please advise

## 2015-07-22 NOTE — Telephone Encounter (Signed)
Pt was advised on 07/21/15 about instructions below per pcp.

## 2015-07-23 ENCOUNTER — Telehealth: Payer: Self-pay | Admitting: Medical

## 2015-07-23 NOTE — Telephone Encounter (Signed)
Will give work note since still hoarse voice and her job is to talk on phone. If you type up I will sign. Confirm that she does have ENT referral.

## 2015-07-23 NOTE — Telephone Encounter (Signed)
I saw her on the 15th. And she wants note from 20-27 th. I need to know what is going on with her before I write out for that long. Does she have continuation of symptoms for which I saw her. Then if so need appointment to see what is going on/other treatment. But also need to know in order to justify then note.

## 2015-07-23 NOTE — Telephone Encounter (Signed)
Edward please advise.  

## 2015-07-23 NOTE — Telephone Encounter (Signed)
Left message for pt to call back about note extension.

## 2015-07-24 ENCOUNTER — Ambulatory Visit: Payer: 59 | Admitting: Pulmonary Disease

## 2015-07-24 NOTE — Telephone Encounter (Signed)
Spoke with pt and she states that she had an appointment on 07/23/15. She states that the ENT wrote her a letter to excuse her for other days. She states that she will be seeing a speech pathologist since ENT referred her out.

## 2015-07-28 ENCOUNTER — Other Ambulatory Visit: Payer: Self-pay | Admitting: Family Medicine

## 2015-08-06 ENCOUNTER — Ambulatory Visit: Payer: 59 | Attending: Family Medicine

## 2015-08-06 DIAGNOSIS — R499 Unspecified voice and resonance disorder: Secondary | ICD-10-CM | POA: Insufficient documentation

## 2015-08-06 NOTE — Therapy (Signed)
[facility] [address] [place], [place], [address] Phone: [phone]   Fax:  [phone]  Speech Language Pathology Evaluation  Patient Details  Name: Sarah Salazar MRN: [id number] Date of Birth: 03-13-1956 Referring Provider: Izora Gala  Encounter Date: 08/06/2015      End of Session - 08/06/15 1547    Visit Number 1   Number of Visits 8   Date for SLP Re-Evaluation 10/10/15   SLP Start Time [id number]   SLP Stop Time  1450   SLP Time Calculation (min) 45 min   Activity Tolerance Patient tolerated treatment well      Past Medical History  Diagnosis Date  . IBS (irritable bowel syndrome)   . Anxiety   . Depression   . Hypothyroidism     hypothyroidism  . Chest pain     Emergency room April 23, 2012, troponin is normal, chest CT scan showed no pulmonary embolus  . Cough     Chronic cough  . Ejection fraction     EF 55-60%, echo, December, 2013, lipomatous hypertrophy of the atrial septum  . GERD (gastroesophageal reflux disease)   . Personal history of failed moderate sedation 06/20/2012  . Personal history of colonic adenoma 06/28/2012  . Complication of anesthesia     "I'm allergic to versed and fentanyl"  . Family history of anesthesia complication     "hard time waking daughter up post SVT ablation"  . Pneumonia     "couple times" (08/01/2013)  . H/O hiatal hernia   . Migraine     "once or twice" (08/01/2013)  . Bulging lumbar disc     "L3-5" (08/01/2013)  . Bipolar disorder [facility])     Past Surgical History  Procedure Laterality Date  . Cholecystectomy    . Dilation and curettage of uterus  X 3  . Knee arthroscopy Right X 2  . Wrist surgery  1979-~ 2010    X 4  . Laparoscopy  1970's - 06/03/1990    "several; to evaluate dysfunctional menses & pelvic pain"  . Cystocele repair  01/13/2009  . Esophagogastroduodenoscopy (egd) with esophageal dilation  2004; 2014  . Colonoscopy  2002  . Elbow surgery  Left 2005  . Breast lumpectomy Left     "1st breast OR"  . Mastectomy, partial Left     "2nd breast OR", benign fibrocystic changes  . Wisdom tooth extraction      "all 4 at once"    There were no vitals filed for this visit.  Visit Diagnosis: Voice disorder          SLP Evaluation [facility] - 08/06/15 1409    SLP Visit Information   SLP Received On 08/06/15   Referring Provider Izora Gala   Onset Date 3 weeks ago   Medical Diagnosis Muscle Tension Dysphonia   General Information   HPI Pt with voice difificulty for approx 3 weeks, including varying "on again, off again" symptoms. 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 ENT visit Constance Holster and Sallee Provencal) recently diagnosed muscle tension dysphonia.   Prior Functional Status   Cognitive/Linguistic Baseline Within functional limits   Cognition   Overall Cognitive Status Within Functional Limits for tasks assessed   Auditory Comprehension   Overall Auditory Comprehension Appears within functional limits for tasks assessed   Verbal Expression   Overall Verbal Expression Appears within functional limits for tasks assessed   Oral Motor/Sensory Function   Overall Oral Motor/Sensory  Function Appears within functional limits for tasks assessed   Motor Speech   Overall Motor Speech Impaired   Phonation Hoarse;Normal   Intelligibility Intelligible   Effective Techniques --  vocal relaxation, mindful gentler phonation   Phonation Impaired  s/z ratio=1.17 (WNL); sustained /a/=12 seconds (<WNL)   Vocal Abuses Habitual Hyperphonia;Prolonged Vocal Use   Tension Present Neck  pt tight in mid/lateral neck- area of sternocleidomastoids   Volume Appropriate     After eval tasks, approx ten minutes (self care/home management) was spent with pt addressing GERD, need for voice relaxation, and going over voice relaxation techniques with pt and how she should incorporate these into her workday  routine.                    SLP Education - 08/06/15 1540    Education provided Yes   Education Details voice hygeine, muscle tension dysphonia, need for relaxed voice production, contrast between speech with and without muscle tension dysphonia, GERD tips   Person(s) Educated Patient   Methods Explanation;Demonstration;Verbal cues   Comprehension Verbalized understanding          SLP Short Term Goals - 08/06/15 1550    SLP SHORT TERM GOAL #1   Title pt will demo vocal relaxation techniques independently over two sessions   Time 4   Period Weeks   Status New   SLP SHORT TERM GOAL #2   Title pt will report decr in tense/strained/strangled voice by 50%   Time 4   Period Weeks   Status New   SLP SHORT TERM GOAL #3   Title pt will increase sustained /a/ to average 14 seconds   Time 4   Period Weeks   Status New          SLP Long Term Goals - 08/06/15 1552    SLP LONG TERM GOAL #1   Title pt will demo voice relaxation techniques independently over three session total   Time 6   Period Weeks   Status New   SLP LONG TERM GOAL #2   Title pt will report decr in tense/strained/strangled voice by 75%   Time 6   Period Weeks   Status New   SLP LONG TERM GOAL #3   Title pt will engage in 10 minutes conversation with WNL voice and no tension reported   Time 6   Period Weeks   Status New          Plan - 08/06/15 1548    Clinical Impression Statement Pt presents today with voice demonstrating s/s muscle tension dysphonia, subsided mostly with SLP education of need to decr tension in voice. See "education" section to summarize education provided today. Pt requires skilled ST to ensure consistency with WNL voicing.    Speech Therapy Frequency 1x /week   Duration --  6 weeks   Treatment/Interventions SLP instruction and feedback;Internal/external aids;Patient/family education;Functional tasks   Potential to Achieve Goals Good   Consulted and Agree with Plan  of Care Patient        Problem List Patient Active Problem List   Diagnosis Date Noted  . OSA (obstructive sleep apnea) 10/09/2014  . Fatigue 10/09/2014  . Cough 09/04/2014  . Acute bronchitis 09/04/2014  . Wheezing 09/04/2014  . Laryngitis 09/04/2014  . Midepigastric pain 06/04/2014  . Syncope 08/01/2013  . Palpitations 05/11/2013  . Obesity (BMI 30-39.9) 04/11/2013  . Personal history of colonic adenoma 06/28/2012  . Personal history of failed moderate sedation 06/20/2012  .  [name] 06/19/2012  . Ejection fraction   . HTN (hypertension) 05/04/2012  . IBS (irritable bowel syndrome)   . Anxiety   . Depression   . Hypothyroidism   . Migraine   . Precordial pain   . Chronic cough 09/10/2011  . Hallucinations 05/14/2010  . HEADACHE 05/14/2010  . B12 DEFICIENCY 02/17/2010  . BIPOLAR DISORDER UNSPECIFIED 10/23/2009  . HYPOKALEMIA 05/20/2009  . ASTERIXIS 12/20/2006  . HYPERLIPIDEMIA NEC/NOS 10/27/2006  . FASTING HYPERGLYCEMIA 10/27/2006    [place] ,[place], CCC-SLP  08/06/2015, 4:02 PM  [facility] [address] [place], [place], [address] Phone: [phone]   Fax:  [phone]  Name: Sarah Salazar MRN: [id number] Date of Birth: [birth year]

## 2015-08-06 NOTE — Patient Instructions (Signed)
Do vocal relaxation/stretches for 30-60 seconds like you learned today, before you punch in, before and after morning break, before and after lunch, before and after PM break, and after you punch out.  Do the motions for relaxation/stretches throughout your day *without the voice* in order to stretch your vocal cords.   Remember you are running a marathon with your voice every day - you have to care for it! Reduce caffeine and increase water, don't overdo it when you're not at work.

## 2015-08-11 ENCOUNTER — Telehealth: Payer: Self-pay

## 2015-08-11 NOTE — Telephone Encounter (Signed)
Lamictal refill requested from [facility]. Rx denied, not filled here.    KP

## 2015-08-20 ENCOUNTER — Ambulatory Visit (INDEPENDENT_AMBULATORY_CARE_PROVIDER_SITE_OTHER): Payer: 59 | Admitting: Pulmonary Disease

## 2015-08-20 ENCOUNTER — Encounter: Payer: Self-pay | Admitting: Pulmonary Disease

## 2015-08-20 ENCOUNTER — Ambulatory Visit: Payer: 59

## 2015-08-20 VITALS — BP 138/82 | HR 63 | Ht 65.0 in | Wt 195.4 lb

## 2015-08-20 DIAGNOSIS — R499 Unspecified voice and resonance disorder: Secondary | ICD-10-CM | POA: Diagnosis not present

## 2015-08-20 DIAGNOSIS — G4733 Obstructive sleep apnea (adult) (pediatric): Secondary | ICD-10-CM

## 2015-08-20 DIAGNOSIS — J383 Other diseases of vocal cords: Secondary | ICD-10-CM | POA: Diagnosis not present

## 2015-08-20 NOTE — Patient Instructions (Signed)
CPAP supplies will be renewed Change DME Okay to stop Qvar Stay on Protonix for acid reflux

## 2015-08-20 NOTE — Telephone Encounter (Signed)
Pt says that her PCP okay'd refilling her medication due to her other provider being out of practice. Pt says tht its 3 medications.   1. Lamictal   2. WELLBUTRIN  3. Xanax   Pharmacy: [facility], [place]

## 2015-08-20 NOTE — Assessment & Plan Note (Signed)
CPAP supplies will be renewed Change DME  Weight loss encouraged, compliance with goal of at least 4-6 hrs every night is the expectation. Advised against medications with sedative side effects Cautioned against driving when sleepy - understanding that sleepiness will vary on a day to day basis

## 2015-08-20 NOTE — Therapy (Signed)
[facility] [address] [place], [place], [address] Phone: [phone]   Fax:  [phone]  Speech Language Pathology Treatment  Patient Details  Name: Sarah Salazar MRN: [id number] Date of Birth: 10-04-1955 Referring Provider: Izora Gala  Encounter Date: 08/20/2015      End of Session - 08/20/15 1657    Visit Number 2   Number of Visits 8   Date for SLP Re-Evaluation 10/10/15   SLP Start Time 1150   SLP Stop Time  1233   SLP Time Calculation (min) 43 min   Activity Tolerance Patient tolerated treatment well      Past Medical History  Diagnosis Date  . IBS (irritable bowel syndrome)   . Anxiety   . Depression   . Hypothyroidism     hypothyroidism  . Chest pain     Emergency room April 23, 2012, troponin is normal, chest CT scan showed no pulmonary embolus  . Cough     Chronic cough  . Ejection fraction     EF 55-60%, echo, December, 2013, lipomatous hypertrophy of the atrial septum  . GERD (gastroesophageal reflux disease)   . Personal history of failed moderate sedation 06/20/2012  . Personal history of colonic adenoma 06/28/2012  . Complication of anesthesia     "I'm allergic to versed and fentanyl"  . Family history of anesthesia complication     "hard time waking daughter up post SVT ablation"  . Pneumonia     "couple times" (08/01/2013)  . H/O hiatal hernia   . Migraine     "once or twice" (08/01/2013)  . Bulging lumbar disc     "L3-5" (08/01/2013)  . Bipolar disorder [facility])     Past Surgical History  Procedure Laterality Date  . Cholecystectomy    . Dilation and curettage of uterus  X 3  . Knee arthroscopy Right X 2  . Wrist surgery  1979-~ 2010    X 4  . Laparoscopy  1970's - 06/03/1990    "several; to evaluate dysfunctional menses & pelvic pain"  . Cystocele repair  01/13/2009  . Esophagogastroduodenoscopy (egd) with esophageal dilation  2004; 2014  . Colonoscopy  2002  . Elbow surgery  Left 2005  . Breast lumpectomy Left     "1st breast OR"  . Mastectomy, partial Left     "2nd breast OR", benign fibrocystic changes  . Wisdom tooth extraction      "all 4 at once"    There were no vitals filed for this visit.      Subjective Assessment - 08/20/15 1156    Subjective Pt reports pain in lateral neck on lt last two days and voice bad since then. Was better before then.   Currently in Pain? No/denies               ADULT SLP TREATMENT - 08/20/15 1157    General Information   Behavior/Cognition Alert;Cooperative;Pleasant mood   Treatment Provided   Treatment provided Cognitive-Linquistic   Cognitive-Linquistic Treatment   Treatment focused on Voice   Skilled Treatment Mod hoarse voice upon greeting SLP in waiting room and pleasantries walking back to ST room. Pt reports s/s vocal cord dysfunction, coughing/throat clearing/closing feeling with Lysol and cigarette smoke, frequent cough/clearing throat due to choking feeling throughout the day. SLP worked with pt on abdominal breathing (AB) at rest with approx 75% success but light-headedness. SLP told pt to breathe deeper. Appointment with pulmonologist this afternoon. SLP told pt to  mention this to him re: symptoms of VCD. SLP used warm compress with pt due to her "S" statement. SLP used vocal relaxation exercises after 2 minutes of heat radiate to lt neck area. Pt with WNL voice in approximately 6 minutes of vocal relaxation exercises. Then pt coughed due to "choking" and voice returned to mod hoarse. SLP again worked with pt with AB and vocal relaxation techniques and pt now with cough/throat clear approx 4 times in 11 minutes, with worsening (to some degree) voice quality after each episode.    Assessment / Recommendations / Plan   Plan Continue with current plan of care;Goals updated  due to s/s vocal cord dysfunction   Progression Toward Goals   Progression toward goals Progressing toward goals          SLP  Education - 08/20/15 1657    Education provided Yes   Education Details abdominal breathing (AB), vocal cord dysfunction   Person(s) Educated Patient   Methods Demonstration;Handout;Verbal cues;Explanation   Comprehension Verbalized understanding;Returned demonstration;Need further instruction;Verbal cues required          SLP Short Term Goals - 08/20/15 1658    SLP SHORT TERM GOAL #1   Title pt will demo vocal relaxation techniques independently over two sessions   Time 4   Period Weeks   Status On-going   SLP SHORT TERM GOAL #2   Title pt will report decr in tense/strained/strangled voice by 50%   Time 4   Period Weeks   Status On-going   SLP SHORT TERM GOAL #3   Title pt will increase sustained /a/ to average 14 seconds   Time 4   Period Weeks   Status On-going          SLP Long Term Goals - 08/20/15 1659    SLP LONG TERM GOAL #1   Title pt will demo voice relaxation techniques independently over three session total   Time 6   Period Weeks   Status On-going   SLP LONG TERM GOAL #2   Title pt will report decr in tense/strained/strangled voice by 75%   Time 6   Period Weeks   Status On-going   SLP LONG TERM GOAL #3   Title pt will engage in 10 minutes conversation with WNL voice and no tension reported   Time 6   Period Weeks   Status On-going          Plan - 08/20/15 1658    Clinical Impression Statement Pt presents today with voice demonstrating s/s muscle tension dysphonia, subsided mostly with SLP education of need to decr tension in voice. See "education" section to summarize education provided today. Pt describes s/s vocal cord dysfunction. Pt requires skilled ST to ensure consistency with WNL voicing.    Speech Therapy Frequency 1x /week   Duration --  6 weeks   Treatment/Interventions SLP instruction and feedback;Internal/external aids;Patient/family education;Functional tasks   Potential to Achieve Goals Good   Potential Considerations Severity of  impairments   Consulted and Agree with Plan of Care Patient      Patient will benefit from skilled therapeutic intervention in order to improve the following deficits and impairments:   Voice disorder    Problem List Patient Active Problem List   Diagnosis Date Noted  . OSA (obstructive sleep apnea) 10/09/2014  . Fatigue 10/09/2014  . Cough 09/04/2014  . Wheezing 09/04/2014  . Vocal cord dysfunction 09/04/2014  . Midepigastric pain 06/04/2014  . Syncope 08/01/2013  . Palpitations 05/11/2013  .  Obesity (BMI 30-39.9) 04/11/2013  . Personal history of colonic adenoma 06/28/2012  . Personal history of failed moderate sedation 06/20/2012  . [name] 06/19/2012  . Ejection fraction   . HTN (hypertension) 05/04/2012  . IBS (irritable bowel syndrome)   . Anxiety   . Depression   . Hypothyroidism   . Migraine   . Precordial pain   . Chronic cough 09/10/2011  . Hallucinations 05/14/2010  . HEADACHE 05/14/2010  . B12 DEFICIENCY 02/17/2010  . BIPOLAR DISORDER UNSPECIFIED 10/23/2009  . HYPOKALEMIA 05/20/2009  . ASTERIXIS 12/20/2006  . HYPERLIPIDEMIA NEC/NOS 10/27/2006  . FASTING HYPERGLYCEMIA 10/27/2006    Milledge Gerding ,MS, CCC-SLP  08/20/2015, 4:59 PM  [facility] [address] [place], [place], [address] Phone: [phone]   Fax:  [phone]   Name: JOANY HASLETT MRN: [id number] Date of Birth: 08-13-1955

## 2015-08-20 NOTE — Progress Notes (Signed)
   Subjective:    Patient ID: Sarah Salazar, female    DOB: [birth year], 58 y.o.   MRN: [id number]  HPI 60 yo female with mild OSA on CPAP & chronic hoarseness Attributed to vocal cord dysfunction She has seen ENT- Constance Holster and laryngoscopy demonstrated vocal cord dysfunction  08/20/2015  Chief Complaint  Patient presents with  . Follow-up    Patient having trouble wearing the CPAP because she has had problems with nasal congestion; Pt needs new supplies.  Almyra Deforest, PT stated patient has Vocal Chord Dysfunction.      she has benefited from starting on C Pap therapy. Download from October 15 through November 13 shows excellent compliance. Average. Uses around 5 hours on a set pressure of 8 cm H2O . AHI 2.9, minimal leaks.   The one time this showed poor compliance she was sick with nasal drainage and hoarseness of voice and hence did not use the machine. DME is not providing her supplies due to this.  She is seeing speech therapy now for vocal cord dysfunction and this is helping somewhat  Does have some stuffy nose, and drainage. He is using nasal pillows. She has thought about switching to a different mask but would like to stay with her nasal pillows for right now.  Significant Studies  10/2014: Spirometry: no evidence of airway obstruction with a ratio 82, FEV1 87% and FVC 84% 10/16/14: IgE: 23 kU/L.  10/29/14 Sleep Study: Mild OSA, 11 events per hour.  Past Medical History  Diagnosis Date  . IBS (irritable bowel syndrome)   . Anxiety   . Depression   . Hypothyroidism     hypothyroidism  . Chest pain     Emergency room April 23, 2012, troponin is normal, chest CT scan showed no pulmonary embolus  . Cough     Chronic cough  . Ejection fraction     EF 55-60%, echo, December, 2013, lipomatous hypertrophy of the atrial septum  . GERD (gastroesophageal reflux disease)   . Personal history of failed moderate sedation 06/20/2012  . Personal history of colonic adenoma  06/28/2012  . Complication of anesthesia     "I'm allergic to versed and fentanyl"  . Family history of anesthesia complication     "hard time waking daughter up post SVT ablation"  . Pneumonia     "couple times" (08/01/2013)  . H/O hiatal hernia   . Migraine     "once or twice" (08/01/2013)  . Bulging lumbar disc     "L3-5" (08/01/2013)  . Bipolar disorder ([facility])     Review of Systems Patient denies significant dyspnea,cough, hemoptysis,  chest pain, palpitations, pedal edema, orthopnea, paroxysmal nocturnal dyspnea, lightheadedness, nausea, vomiting, abdominal or  leg pains      Objective:   Physical Exam  Gen. Pleasant, obese, in no distress ENT - no lesions, no post nasal drip Neck: No JVD, no thyromegaly, no carotid bruits Lungs: no use of accessory muscles, no dullness to percussion, decreased without rales or rhonchi  Cardiovascular: Rhythm regular, heart sounds  normal, no murmurs or gallops, no peripheral edema Musculoskeletal: No deformities, no cyanosis or clubbing , no tremors       Assessment & Plan:

## 2015-08-20 NOTE — Patient Instructions (Signed)
Practice abdominal breathing 10 minutes at least twice per day. If feeling light headed breathe "normally" for approx 2 minutes then continue with abdominal breathing.  Talk to your pulmonologist about the handout I gave to you today.

## 2015-08-20 NOTE — Assessment & Plan Note (Signed)
Okay to stop Qvar Stay on Protonix for acid reflux Continue speech therapy

## 2015-08-21 ENCOUNTER — Ambulatory Visit (HOSPITAL_BASED_OUTPATIENT_CLINIC_OR_DEPARTMENT_OTHER)
Admission: RE | Admit: 2015-08-21 | Discharge: 2015-08-21 | Disposition: A | Discharge status: Home / Self Care | Payer: 59 | Source: Ambulatory Visit | Attending: Family Medicine | Admitting: Family Medicine

## 2015-08-21 ENCOUNTER — Ambulatory Visit (INDEPENDENT_AMBULATORY_CARE_PROVIDER_SITE_OTHER): Payer: 59 | Admitting: Family Medicine

## 2015-08-21 ENCOUNTER — Encounter: Payer: Self-pay | Admitting: Family Medicine

## 2015-08-21 ENCOUNTER — Other Ambulatory Visit: Payer: Self-pay | Admitting: Family Medicine

## 2015-08-21 ENCOUNTER — Encounter (HOSPITAL_BASED_OUTPATIENT_CLINIC_OR_DEPARTMENT_OTHER): Payer: Self-pay

## 2015-08-21 ENCOUNTER — Ambulatory Visit (HOSPITAL_BASED_OUTPATIENT_CLINIC_OR_DEPARTMENT_OTHER): Admission: RE | Admit: 2015-08-21 | Payer: 59 | Source: Ambulatory Visit

## 2015-08-21 VITALS — BP 134/80 | HR 58 | Temp 98.7°F | Ht 65.0 in | Wt 193.6 lb

## 2015-08-21 DIAGNOSIS — I779 Disorder of arteries and arterioles, unspecified: Secondary | ICD-10-CM | POA: Diagnosis not present

## 2015-08-21 DIAGNOSIS — J3800 Paralysis of vocal cords and larynx, unspecified: Secondary | ICD-10-CM | POA: Insufficient documentation

## 2015-08-21 DIAGNOSIS — R499 Unspecified voice and resonance disorder: Secondary | ICD-10-CM | POA: Diagnosis not present

## 2015-08-21 DIAGNOSIS — M542 Cervicalgia: Secondary | ICD-10-CM | POA: Diagnosis not present

## 2015-08-21 MED ORDER — LAMOTRIGINE 200 MG PO TABS: 200.0000 mg | ORAL_TABLET | Freq: Every day | ORAL | Status: DC

## 2015-08-21 MED ORDER — ALPRAZOLAM 1 MG PO TABS: 1.0000 mg | ORAL_TABLET | Freq: Three times a day (TID) | ORAL | Status: DC | PRN

## 2015-08-21 MED ORDER — IOPAMIDOL (ISOVUE-300) INJECTION 61%: 75.0000 mL | Freq: Once | INTRAVENOUS | Status: DC | PRN

## 2015-08-21 MED ORDER — BUPROPION HCL 100 MG PO TABS
100.0000 mg | ORAL_TABLET | Freq: Every day | ORAL | Status: DC
Start: 2015-08-21 — End: 2015-08-26

## 2015-08-21 NOTE — Telephone Encounter (Signed)
Rx faxed to pharmacy  

## 2015-08-21 NOTE — Progress Notes (Signed)
[place] [facility] [address] [place], [place] [address] [phone]  Date:  08/21/2015   Name:  Sarah Salazar   DOB:  1955/10/29   MRN:  [id number]  PCP:  Ann Held, DO    Chief Complaint: Neck Pain   History of Present Illness:  Sarah Salazar is a 60 y.o. very pleasant female patient who presents with the following:  She was here a month ago with laryngitis.  She is seeing a voice therapist currently for vocal cord dysfunction.  Here today for another, possibly related concern.  She has noted a tender area in the left side of her neck over the last 3 days. It can wake her up at night. However it is not really bothersome when she is awake and upright unless she presses on the area She has full ROM of her neck She is not aware of any injury  She has noted the hoarseness just over the last month or so.    Never a smoker, never used smokeless tobacco.   She is s/p menopause.   No difficulty swallowing No weight loss No fevers  Wt Readings from Last 3 Encounters:  08/21/15 193 lb 9.6 oz (87.816 kg)  08/20/15 195 lb 6.4 oz (88.633 kg)  07/16/15 192 lb 6.4 oz (87.272 kg)   History of OSA, HTN, palpitations Patient Active Problem List   Diagnosis Date Noted  . OSA (obstructive sleep apnea) 10/09/2014  . Fatigue 10/09/2014  . Cough 09/04/2014  . Wheezing 09/04/2014  . Vocal cord dysfunction 09/04/2014  . Midepigastric pain 06/04/2014  . Syncope 08/01/2013  . Palpitations 05/11/2013  . Obesity (BMI 30-39.9) 04/11/2013  . Personal history of colonic adenoma 06/28/2012  . Personal history of failed moderate sedation 06/20/2012  . [name] 06/19/2012  . Ejection fraction   . HTN (hypertension) 05/04/2012  . IBS (irritable bowel syndrome)   . Anxiety   . Depression   . Hypothyroidism   . Migraine   . Precordial pain   . Chronic cough 09/10/2011  . Hallucinations 05/14/2010  . HEADACHE  05/14/2010  . B12 DEFICIENCY 02/17/2010  . BIPOLAR DISORDER UNSPECIFIED 10/23/2009  . HYPOKALEMIA 05/20/2009  . ASTERIXIS 12/20/2006  . HYPERLIPIDEMIA NEC/NOS 10/27/2006  . FASTING HYPERGLYCEMIA 10/27/2006    Past Medical History  Diagnosis Date  . IBS (irritable bowel syndrome)   . Anxiety   . Depression   . Hypothyroidism     hypothyroidism  . Chest pain     Emergency room April 23, 2012, troponin is normal, chest CT scan showed no pulmonary embolus  . Cough     Chronic cough  . Ejection fraction     EF 55-60%, echo, December, 2013, lipomatous hypertrophy of the atrial septum  . GERD (gastroesophageal reflux disease)   . Personal history of failed moderate sedation 06/20/2012  . Personal history of colonic adenoma 06/28/2012  . Complication of anesthesia     "I'm allergic to versed and fentanyl"  . Family history of anesthesia complication     "hard time waking daughter up post SVT ablation"  . Pneumonia     "couple times" (08/01/2013)  . H/O hiatal hernia   . Migraine     "once or twice" (08/01/2013)  . Bulging lumbar disc     "L3-5" (08/01/2013)  . Bipolar disorder [facility])     Past Surgical History  Procedure Laterality Date  . Cholecystectomy    .  Dilation and curettage of uterus  X 3  . Knee arthroscopy Right X 2  . Wrist surgery  1979-~ 2010    X 4  . Laparoscopy  1970's - 06/03/1990    "several; to evaluate dysfunctional menses & pelvic pain"  . Cystocele repair  01/13/2009  . Esophagogastroduodenoscopy (egd) with esophageal dilation  2004; 2014  . Colonoscopy  2002  . Elbow surgery Left 2005  . Breast lumpectomy Left     "1st breast OR"  . Mastectomy, partial Left     "2nd breast OR", benign fibrocystic changes  . Wisdom tooth extraction      "all 4 at once"    Social History  Substance Use Topics  . Smoking status: Never Smoker   . Smokeless tobacco: Never Used  . Alcohol Use: Yes     Comment: 08/01/2013 "glass of wine 6 times/year at most"    Family  History  Problem Relation Age of Onset  . Coronary artery disease Mother   . Stroke Mother     TIA  . Uterine cancer Mother     BREAST  . Thyroid disease Mother     goiter  . Lung cancer Father   . Heart disease Father   . COPD Father   . Coronary artery disease Father   . Colon cancer Paternal Uncle     COLON; STOMACH  . Lung cancer Maternal Grandfather   . Rheum arthritis Maternal Aunt   . Supraventricular tachycardia Daughter     S/P ablation  . Thyroid disease Cousin     Allergies  Allergen Reactions  . Amoxicillin Rash       . Fentanyl Rash    given with Versed  . Midazolam Rash    given with Fentanyl  . Oxycodone-Aspirin Rash  . Sulfonamide Derivatives Rash    rash  . Hydrocodone Hives and Nausea And Vomiting  . Nalbuphine Nausea And Vomiting  . Oxycodone-Acetaminophen Hives and Nausea And Vomiting  . Venlafaxine Other (See Comments)    Patient says makes her crazy  . Verapamil Palpitations    Caused "pounding " in chest    Medication list has been reviewed and updated.  Current Outpatient Prescriptions on File Prior to Visit  Medication Sig Dispense Refill  . albuterol (PROAIR HFA) 108 (90 BASE) MCG/ACT inhaler Inhale 2 puffs into the lungs every 6 (six) hours as needed for wheezing or shortness of breath. 1 Inhaler 2  . ALPRAZolam (XANAX) 1 MG tablet Take 1 tablet by mouth as needed.    Marland Kitchen buPROPion (WELLBUTRIN) 100 MG tablet Take 1 tablet (100 mg total) by mouth at bedtime. 90 tablet 0  . busPIRone (BUSPAR) 10 MG tablet Take 1 tablet (10 mg total) by mouth 2 (two) times daily. 60 tablet 5  . fluticasone (FLONASE) 50 MCG/ACT nasal spray Place 2 sprays into both nostrils daily. 16 g 2  . metaxalone (SKELAXIN) 800 MG tablet     . metoprolol tartrate (LOPRESSOR) 25 MG tablet TAKE ONE-HALF (1/2) TABLET TWICE A DAY 180 tablet 0  . pantoprazole (PROTONIX) 40 MG tablet TAKE 1 TABLET DAILY 90 tablet 1  . promethazine (PHENERGAN) 25 MG tablet Take 1 tablet (25 mg  total) by mouth every 6 (six) hours as needed for nausea or vomiting. 90 tablet 3  . thyroid (ARMOUR THYROID) 90 MG tablet Take 1/2 tablet daily 45 tablet 1  . traMADol (ULTRAM) 50 MG tablet Take 1 tablet (50 mg total) by mouth every 6 (six)  hours as needed. 30 tablet 0  . benzonatate (TESSALON) 100 MG capsule Take 1 capsule (100 mg total) by mouth 3 (three) times daily as needed. (Patient not taking: Reported on 08/21/2015) 20 capsule 0  . hyoscyamine (LEVSIN SL) 0.125 MG SL tablet Place 1 tablet (0.125 mg total) under the tongue every 4 (four) hours as needed. (Patient not taking: Reported on 08/21/2015) 90 tablet 3  . ranitidine (ZANTAC) 150 MG capsule Take 1 capsule (150 mg total) by mouth 2 (two) times daily. (Patient not taking: Reported on 08/21/2015) 60 capsule 0   No current facility-administered medications on file prior to visit.    Review of Systems:  As per HPI- otherwise negative.   Physical Examination: Filed Vitals:   08/21/15 1121  BP: 134/80  Pulse: 58  Temp: 98.7 F (37.1 C)   Filed Vitals:   08/21/15 1121  Height: 5\' 5"  (1.651 m)  Weight: 193 lb 9.6 oz (87.816 kg)   Body mass index is 32.22 kg/(m^2). Ideal Body Weight: Weight in (lb) to have BMI = 25: 149.9  GEN: WDWN, NAD, Non-toxic, A & O x 3, obese, looks well,  Voice is intermittently hoarse HEENT: Atraumatic, Normocephalic. Neck supple. No masses, No LAD. Bilateral TM wnl, oropharynx normal.  PEERL,EOMI.   No masses.  She does note exquisite tenderness in one specific area in the left neck, where anterior nodes might be felt.  There is no mass, lesion, or skin change in this area  Ears and Nose: No external deformity. CV: RRR, No M/G/R. No JVD. No thrill. No extra heart sounds. PULM: CTA B, no wheezes, crackles, rhonchi. No retractions. No resp. distress. No accessory muscle use.Marland Kitchen EXTR: No c/c/e NEURO Normal gait.  PSYCH: Normally interactive. Conversant. Not depressed or anxious appearing.  Calm demeanor.     Assessment and Plan: Change in voice - Plan: CT Soft Tissue Neck W Contrast  Neck pain on left side - Plan: CT Soft Tissue Neck W Contrast  Concerning combination of voice change and pain in the neck.  Will do imaging to rule- out malignancy  She agrees and we will proceed to CT  Ct Soft Tissue Neck W Contrast  08/21/2015  CLINICAL DATA:  60 year old female with hoarseness onset 1 month ago. New left side neck pain more recently. Initial encounter. EXAM: CT NECK WITH CONTRAST TECHNIQUE: Multidetector CT imaging of the neck was performed using the standard protocol following the bolus administration of intravenous contrast. CONTRAST:  75 mL Isovue-300 COMPARISON:  CT head without contrast 08/01/2013 and earlier. FINDINGS: Pharynx and larynx: No laryngeal mass. There is evidence of right vocal cord paralysis or palsy, with mild enlargement of the right laryngeal ventricle (series 3, image 66) and asymmetric enlargement of the right piriform sinus. The hypopharynx is within normal limits. The oropharynx and nasopharynx are within normal limits. No pharyngeal mass. Negative parapharyngeal and retropharyngeal spaces. Salivary glands: Sublingual space, submandibular glands and parotid glands are within normal limits. Thyroid: Negative. Lymph nodes: No cervical lymphadenopathy. Symmetric and fairly diminutive bilateral cervical lymph nodes. The largest nodes are at the bilateral level 2 nodal stations, measuring 5 mm short axis with preserved normal fatty hila. No inflammatory stranding identified in the neck. Vascular: Major vascular structures in the neck and at the skullbase are patent. There is soft atherosclerotic plaque at the lateral left ICA origin and bulb (series 3, image 59), but this is not hemodynamically significant. No evidence of carotid or vertebral artery dissection. The left vertebral artery  is dominant (normal variant). Limited intracranial: Negative. Visualized orbits: Negative. Mastoids  and visualized paranasal sinuses: Clear. Skeleton: No osseous abnormality identified, mild for age osseous degenerative changes in the cervical spine (C5-C6). Upper chest: Normal lung apices and visualized bilateral upper lobes and superior lower lobe segments. There is mild mediastinal lipomatosis. No superior mediastinal lymphadenopathy. No axillary lymphadenopathy. IMPRESSION: 1. Subtle evidence of right side vocal cord palsy or paralysis. But no causative lesion is identified. 2. No laryngeal or pharyngeal mass.  No cervical lymphadenopathy. 3. No explanation for left neck soft tissue pain is identified. There is soft atherosclerotic plaque at the left ICA origin and bulb, but this does not appear hemodynamically significant. There is no left carotid or vertebral artery dissection. 4. Negative visualized upper chest. Electronically Signed   By: Genevie Ann M.D.   On: 08/21/2015 15:49   Happy to receive a negative CT report.  Called pt and LMOM  Signed Lamar Blinks, MD

## 2015-08-21 NOTE — Telephone Encounter (Signed)
Xanax 1 po tid prn   #60

## 2015-08-21 NOTE — Addendum Note (Signed)
Addended by: Dorrene German on: 08/21/2015 02:21 PM   Modules accepted: Orders

## 2015-08-21 NOTE — Telephone Encounter (Addendum)
Lamictal and Wellbutrin filled to pharmacy as requested.   Please verify sig and quantity for Xanax. Current sig does not state frequency.  Current sig: Take 1 tablet by mouth as needed.

## 2015-08-21 NOTE — Addendum Note (Signed)
Addended by: Dorrene German on: 08/21/2015 09:07 AM   Modules accepted: Orders

## 2015-08-21 NOTE — Telephone Encounter (Signed)
Rx printed, to PCP for signature. 

## 2015-08-21 NOTE — Progress Notes (Signed)
Pre visit review using our clinic tool,if applicable. No additional management support is needed unless otherwise documented below in the visit note.  

## 2015-08-21 NOTE — Telephone Encounter (Signed)
Ok to refill 

## 2015-08-22 ENCOUNTER — Telehealth: Payer: Self-pay | Admitting: Family Medicine

## 2015-08-22 NOTE — Telephone Encounter (Signed)
Caller name:Self  Can be reached: [phone]   Reason for call: Patient called requesting results from her MRI yesterday. States that Dr. Lorelei Pont ordered and left a message on her vm last night but she did not state the next step.

## 2015-08-25 ENCOUNTER — Telehealth: Payer: Self-pay | Admitting: Family Medicine

## 2015-08-25 ENCOUNTER — Other Ambulatory Visit: Payer: Self-pay | Admitting: Family Medicine

## 2015-08-25 DIAGNOSIS — J040 Acute laryngitis: Secondary | ICD-10-CM

## 2015-08-25 NOTE — Telephone Encounter (Signed)
Entered by Darreld Mclean, MD at 08/23/2015 2:45 PM    Read by Barrington Ellison at 08/25/2015 2:24 PM    Here is a copy of your CT report for your records. Continue your voice therapy and let me know if your pain persists! Take care and call or write me back if any questions.

## 2015-08-25 NOTE — Telephone Encounter (Signed)
Relation to [id number] Call back number:[phone]   Reason for call:  Patient would like a referral to another ENT due to getting a second opinion. Please advise

## 2015-08-25 NOTE — Telephone Encounter (Signed)
Ok to refer for second opinion

## 2015-08-25 NOTE — Telephone Encounter (Signed)
Will need new referral, stating second opinion

## 2015-08-25 NOTE — Telephone Encounter (Signed)
Please advise 

## 2015-08-26 ENCOUNTER — Telehealth: Payer: Self-pay | Admitting: Family Medicine

## 2015-08-26 ENCOUNTER — Ambulatory Visit: Payer: 59 | Admitting: Pulmonary Disease

## 2015-08-26 MED ORDER — BUPROPION HCL 100 MG PO TABS: 100.0000 mg | ORAL_TABLET | Freq: Every day | ORAL | Status: DC

## 2015-08-26 MED ORDER — ALPRAZOLAM 1 MG PO TABS: 1.0000 mg | ORAL_TABLET | Freq: Three times a day (TID) | ORAL | Status: DC | PRN

## 2015-08-26 NOTE — Telephone Encounter (Signed)
Xanax was supposed to go to [facility] but was sent downstairs. Per Dr.Lowne it is ok to fax.   Rx printed, signed and faxed to [facility].   KP

## 2015-08-26 NOTE — Telephone Encounter (Signed)
Relation to [id number] Call back number:[phone] Pharmacy: [facility], [place] [phone] (Phone) [phone] (Fax)         Reason for call:  Patient requesting ALPRAZolam (XANAX) 1 MG tablet and buPROPion (WELLBUTRIN) 100 MG tablet please send to [facility]

## 2015-08-29 ENCOUNTER — Ambulatory Visit: Payer: 59

## 2015-09-05 ENCOUNTER — Ambulatory Visit: Payer: 59 | Attending: Family Medicine

## 2015-09-05 DIAGNOSIS — R499 Unspecified voice and resonance disorder: Secondary | ICD-10-CM | POA: Diagnosis not present

## 2015-09-05 NOTE — Patient Instructions (Signed)
Practice abdominal breathing 15-20 mintues per day, twice per day. IF you get light headed, take in more air with each breath.

## 2015-09-05 NOTE — Therapy (Signed)
[facility] [address] [place], [place], [address] Phone: [phone]   Fax:  [phone]  Speech Language Pathology Treatment  Patient Details  Name: Sarah Salazar MRN: [id number] Date of Birth: [birth year] Referring Provider: Izora Gala  Encounter Date: 09/05/2015      End of Session - 09/05/15 1627    Visit Number 3   Number of Visits 8   Date for SLP Re-Evaluation 10/10/15   SLP Start Time 1150   SLP Stop Time  1230   SLP Time Calculation (min) 40 min   Activity Tolerance Patient tolerated treatment well      Past Medical History  Diagnosis Date  . IBS (irritable bowel syndrome)   . Anxiety   . Depression   . Hypothyroidism     hypothyroidism  . Chest pain     Emergency room April 23, 2012, troponin is normal, chest CT scan showed no pulmonary embolus  . Cough     Chronic cough  . Ejection fraction     EF 55-60%, echo, December, 2013, lipomatous hypertrophy of the atrial septum  . GERD (gastroesophageal reflux disease)   . Personal history of failed moderate sedation 06/20/2012  . Personal history of colonic adenoma 06/28/2012  . Complication of anesthesia     "I'm allergic to versed and fentanyl"  . Family history of anesthesia complication     "hard time waking daughter up post SVT ablation"  . Pneumonia     "couple times" (08/01/2013)  . H/O hiatal hernia   . Migraine     "once or twice" (08/01/2013)  . Bulging lumbar disc     "L3-5" (08/01/2013)  . Bipolar disorder [facility])     Past Surgical History  Procedure Laterality Date  . Cholecystectomy    . Dilation and curettage of uterus  X 3  . Knee arthroscopy Right X 2  . Wrist surgery  1979-~ 2010    X 4  . Laparoscopy  1970's - 06/03/1990    "several; to evaluate dysfunctional menses & pelvic pain"  . Cystocele repair  01/13/2009  . Esophagogastroduodenoscopy (egd) with esophageal dilation  2004; 2014  . Colonoscopy  2002  . Elbow surgery  Left 2005  . Breast lumpectomy Left     "1st breast OR"  . Mastectomy, partial Left     "2nd breast OR", benign fibrocystic changes  . Wisdom tooth extraction      "all 4 at once"    There were no vitals filed for this visit.      Subjective Assessment - 09/05/15 1151    Subjective Pt's voice notably less hoarse today than last session.   Currently in Pain? No/denies               ADULT SLP TREATMENT - 09/05/15 1158    General Information   Behavior/Cognition Alert;Cooperative;Pleasant mood   Treatment Provided   Treatment provided Cognitive-Linquistic   Cognitive-Linquistic Treatment   Treatment focused on Voice   Skilled Treatment Pt told SLP some difficulties with her home life - needing to evict tenants. Minimal strained voice intermittently heard today, wihtout hoarseness. Pt rated voice 8/10 today (10=WNL).  In simple answer repsonses with abdominal breathing to encourage relaxed vocal fold vibration/voice production, pt was 95% successful. SLP told pt to practice abdominal breathing in rest, 15-20 mintues each day, twice a day.   Assessment / Recommendations / Plan   Plan Continue with current plan of care;Goals updated  Progression Toward Goals   Progression toward goals Progressing toward goals            SLP Short Term Goals - 09/05/15 1630    SLP SHORT TERM GOAL #1   Title pt will demo vocal relaxation techniques independently over two sessions   Time 3   Period Weeks   Status On-going   SLP SHORT TERM GOAL #2   Title pt will report decr in tense/strained/strangled voice by 50%   Status Achieved   SLP SHORT TERM GOAL #3   Title pt will increase sustained /a/ to average 14 seconds   Time 3   Period Weeks   Status On-going          SLP Long Term Goals - 09/05/15 1630    SLP LONG TERM GOAL #1   Title pt will demo voice relaxation techniques independently over three session total   Time 5   Period Weeks   Status On-going   SLP LONG TERM  GOAL #2   Title pt will report decr in tense/strained/strangled voice by 75%   Time 5   Period Weeks   Status On-going   SLP LONG TERM GOAL #3   Title pt will engage in 10 minutes conversation with WNL voice and no tension reported   Time 5   Period Weeks   Status On-going          Plan - 09/05/15 1628    Clinical Impression Statement Pt voice not demonstrating s/s muscle tension dysphonia for approx 85% of session. Pt describes s/s vocal cord dysfunction. Pt requires skilled ST to ensure consistency with WNL voicing and habitualize abdominal breathing.    Speech Therapy Frequency 1x /week   Duration 4 weeks   Treatment/Interventions SLP instruction and feedback;Internal/external aids;Patient/family education;Functional tasks   Potential to Achieve Goals Good   Potential Considerations Severity of impairments   Consulted and Agree with Plan of Care Patient      Patient will benefit from skilled therapeutic intervention in order to improve the following deficits and impairments:   Voice disorder    Problem List Patient Active Problem List   Diagnosis Date Noted  . OSA (obstructive sleep apnea) 10/09/2014  . Fatigue 10/09/2014  . Cough 09/04/2014  . Wheezing 09/04/2014  . Vocal cord dysfunction 09/04/2014  . Midepigastric pain 06/04/2014  . Syncope 08/01/2013  . Palpitations 05/11/2013  . Obesity (BMI 30-39.9) 04/11/2013  . Personal history of colonic adenoma 06/28/2012  . Personal history of failed moderate sedation 06/20/2012  . [name] 06/19/2012  . Ejection fraction   . HTN (hypertension) 05/04/2012  . IBS (irritable bowel syndrome)   . Anxiety   . Depression   . Hypothyroidism   . Migraine   . Precordial pain   . Chronic cough 09/10/2011  . Hallucinations 05/14/2010  . HEADACHE 05/14/2010  . B12 DEFICIENCY 02/17/2010  . BIPOLAR DISORDER UNSPECIFIED 10/23/2009  . HYPOKALEMIA 05/20/2009  . ASTERIXIS 12/20/2006  . HYPERLIPIDEMIA NEC/NOS 10/27/2006  .  FASTING HYPERGLYCEMIA 10/27/2006    SCHINKE,CARL ,[place], CCC-SLP  09/05/2015, 4:50 PM  [facility] [address] [place], [place], [address] Phone: [phone]   Fax:  [phone]   Name: Sarah Salazar MRN: [id number] Date of Birth: [birth year]

## 2015-09-12 ENCOUNTER — Ambulatory Visit: Payer: 59

## 2015-09-12 DIAGNOSIS — R499 Unspecified voice and resonance disorder: Secondary | ICD-10-CM

## 2015-09-12 NOTE — Therapy (Signed)
[facility] [address] [place], [place], [address] Phone: [phone]   Fax:  [phone]  Speech Language Pathology Treatment  Patient Details  Name: Sarah Salazar MRN: [id number] Date of Birth: 1956-02-19 Referring Provider: Izora Gala  Encounter Date: 09/12/2015      End of Session - 09/12/15 1354    Visit Number 4   Number of Visits 8   Date for SLP Re-Evaluation 10/10/15   SLP Start Time [id number]   SLP Stop Time  1224   SLP Time Calculation (min) 39 min   Activity Tolerance Patient tolerated treatment well      Past Medical History  Diagnosis Date  . IBS (irritable bowel syndrome)   . Anxiety   . Depression   . Hypothyroidism     hypothyroidism  . Chest pain     Emergency room April 23, 2012, troponin is normal, chest CT scan showed no pulmonary embolus  . Cough     Chronic cough  . Ejection fraction     EF 55-60%, echo, December, 2013, lipomatous hypertrophy of the atrial septum  . GERD (gastroesophageal reflux disease)   . Personal history of failed moderate sedation 06/20/2012  . Personal history of colonic adenoma 06/28/2012  . Complication of anesthesia     "I'm allergic to versed and fentanyl"  . Family history of anesthesia complication     "hard time waking daughter up post SVT ablation"  . Pneumonia     "couple times" (08/01/2013)  . H/O hiatal hernia   . Migraine     "once or twice" (08/01/2013)  . Bulging lumbar disc     "L3-5" (08/01/2013)  . Bipolar disorder [facility])     Past Surgical History  Procedure Laterality Date  . Cholecystectomy    . Dilation and curettage of uterus  X 3  . Knee arthroscopy Right X 2  . Wrist surgery  1979-~ 2010    X 4  . Laparoscopy  1970's - 06/03/1990    "several; to evaluate dysfunctional menses & pelvic pain"  . Cystocele repair  01/13/2009  . Esophagogastroduodenoscopy (egd) with esophageal dilation  2004; 2014  . Colonoscopy  2002  . Elbow surgery  Left 2005  . Breast lumpectomy Left     "1st breast OR"  . Mastectomy, partial Left     "2nd breast OR", benign fibrocystic changes  . Wisdom tooth extraction      "all 4 at once"    There were no vitals filed for this visit.      Subjective Assessment - 09/12/15 1147    Subjective Pt reports voice 9/10 today.    Currently in Pain? No/denies               ADULT SLP TREATMENT - 09/12/15 1153    General Information   Behavior/Cognition Alert;Cooperative;Pleasant mood   Treatment Provided   Treatment provided Cognitive-Linquistic   Cognitive-Linquistic Treatment   Treatment focused on Voice   Skilled Treatment Pt reports her voice has improved and she has felt more like her normal voice. Sustained /a/ 14 second average. Intermittent hoarseness heard today (approx 35%) during conversation until pt focused on using abdominal support. In 20 minutes conversation after this, pt's abdominal support improved and hoarseness only heard approx 10%. conversation outside [place] room resulted in same 10% frequency of hoarseness. SLP and pt discussed possible discharge next session to ensure consistency.   Assessment / Recommendations / Plan   Plan Continue with  current plan of care;Goals updated   Progression Toward Goals   Progression toward goals Progressing toward goals            SLP Short Term Goals - 09/12/15 1355    SLP SHORT TERM GOAL #1   Title pt will demo vocal relaxation techniques independently over two sessions   Status Deferred  pt's voice no longer tense/tight   SLP SHORT TERM GOAL #2   Title pt will report decr in tense/strained/strangled voice by 50%   Status Achieved   SLP SHORT TERM GOAL #3   Title pt will increase sustained /a/ to average 14 seconds   Status Achieved          SLP Long Term Goals - 09/12/15 1214    SLP LONG TERM GOAL #1   Title pt will demo voice relaxation techniques independently over three session total   Status Deferred  tension  not occuring enough to need vocal relaxation   SLP LONG TERM GOAL #2   Title pt will report decr in tense/strained/strangled voice by 75%   Status Achieved   SLP LONG TERM GOAL #3   Title pt will engage in 10 minutes conversation with WNL voice and no tension reported over two sessions   Baseline one session 09-12-15   Time 4   Period Weeks   Status Revised          Plan - 09/12/15 1354    Clinical Impression Statement Pt voice nmuch improved today, appox 10% horseness observed when pt focusing on abdominal strength/breathing with speech. Pt requires skilled ST likely one more session to ensure consistency with WNL voicing and habitualize abdominal breathing.    Speech Therapy Frequency 1x /week   Duration --  3 weeks   Treatment/Interventions SLP instruction and feedback;Internal/external aids;Patient/family education;Functional tasks   Potential to Achieve Goals Good   Potential Considerations Severity of impairments   Consulted and Agree with Plan of Care Patient      Patient will benefit from skilled therapeutic intervention in order to improve the following deficits and impairments:   Voice disorder    Problem List Patient Active Problem List   Diagnosis Date Noted  . OSA (obstructive sleep apnea) 10/09/2014  . Fatigue 10/09/2014  . Cough 09/04/2014  . Wheezing 09/04/2014  . Vocal cord dysfunction 09/04/2014  . Midepigastric pain 06/04/2014  . Syncope 08/01/2013  . Palpitations 05/11/2013  . Obesity (BMI 30-39.9) 04/11/2013  . Personal history of colonic adenoma 06/28/2012  . Personal history of failed moderate sedation 06/20/2012  . [name] 06/19/2012  . Ejection fraction   . HTN (hypertension) 05/04/2012  . IBS (irritable bowel syndrome)   . Anxiety   . Depression   . Hypothyroidism   . Migraine   . Precordial pain   . Chronic cough 09/10/2011  . Hallucinations 05/14/2010  . HEADACHE 05/14/2010  . B12 DEFICIENCY 02/17/2010  . BIPOLAR DISORDER  UNSPECIFIED 10/23/2009  . HYPOKALEMIA 05/20/2009  . ASTERIXIS 12/20/2006  . HYPERLIPIDEMIA NEC/NOS 10/27/2006  . FASTING HYPERGLYCEMIA 10/27/2006    SCHINKE,CARL ,MS, CCC-SLP  09/12/2015, 1:56 PM  [facility] [address] [place], [place], [address] Phone: [phone]   Fax:  [phone]   Name: Sarah Salazar MRN: [id number] Date of Birth: 1955-08-05

## 2015-09-19 ENCOUNTER — Ambulatory Visit: Payer: 59

## 2015-09-19 DIAGNOSIS — R499 Unspecified voice and resonance disorder: Secondary | ICD-10-CM

## 2015-09-19 NOTE — Therapy (Signed)
[facility] [address] [place], [place], [address] Phone: [phone]   Fax:  [phone]  Speech Language Pathology Treatment  Patient Details  Name: Sarah Salazar MRN: [id number] Date of Birth: 1955-07-31 Referring Provider: Izora Gala  Encounter Date: 09/19/2015      End of Session - 09/19/15 1414    Visit Number 5   Number of Visits 8   Date for SLP Re-Evaluation 10/10/15   SLP Start Time 1320   SLP Stop Time  1404   SLP Time Calculation (min) 44 min   Activity Tolerance Patient tolerated treatment well      Past Medical History  Diagnosis Date  . IBS (irritable bowel syndrome)   . Anxiety   . Depression   . Hypothyroidism     hypothyroidism  . Chest pain     Emergency room April 23, 2012, troponin is normal, chest CT scan showed no pulmonary embolus  . Cough     Chronic cough  . Ejection fraction     EF 55-60%, echo, December, 2013, lipomatous hypertrophy of the atrial septum  . GERD (gastroesophageal reflux disease)   . Personal history of failed moderate sedation 06/20/2012  . Personal history of colonic adenoma 06/28/2012  . Complication of anesthesia     "I'm allergic to versed and fentanyl"  . Family history of anesthesia complication     "hard time waking daughter up post SVT ablation"  . Pneumonia     "couple times" (08/01/2013)  . H/O hiatal hernia   . Migraine     "once or twice" (08/01/2013)  . Bulging lumbar disc     "L3-5" (08/01/2013)  . Bipolar disorder [facility])     Past Surgical History  Procedure Laterality Date  . Cholecystectomy    . Dilation and curettage of uterus  X 3  . Knee arthroscopy Right X 2  . Wrist surgery  1979-~ 2010    X 4  . Laparoscopy  1970's - 06/03/1990    "several; to evaluate dysfunctional menses & pelvic pain"  . Cystocele repair  01/13/2009  . Esophagogastroduodenoscopy (egd) with esophageal dilation  2004; 2014  . Colonoscopy  2002  . Elbow surgery  Left 2005  . Breast lumpectomy Left     "1st breast OR"  . Mastectomy, partial Left     "2nd breast OR", benign fibrocystic changes  . Wisdom tooth extraction      "all 4 at once"    There were no vitals filed for this visit.      Subjective Assessment - 09/19/15 1326    Subjective Pt arrives with voice harsh/tight today. Voice rated 4/10. "Work has been calling again. I'm starting to wonder if tightness is related to work."               ADULT SLP TREATMENT - 09/19/15 1334    General Information   Behavior/Cognition Alert;Cooperative;Pleasant mood   Treatment Provided   Treatment provided Cognitive-Linquistic   Cognitive-Linquistic Treatment   Treatment focused on Voice   Skilled Treatment Pt rated voice 7/10 to 8/10 throughout session when voice tension/tightness subsided mid-sentence for up to 45 seconds, x7, in the first 15 minutes of today's session. Pt with dry hacking cough during this first 20 minutes, not unlike someone with vocal cord dysfunction (VCD). Pt without any of this VCD-like behavior in previous 1-2 sessions. SLP taught pt the techniques for VCD symptoms (sniff-blow, tightness resolving like a steam or melting away)  and pt began sniff-blow when feelings of cough/dryness/throat closing. Interestingly her tightness/strangled quality was no longer evident after that time, in last 15-20 minutes of session)     Assessment / Recommendations / [place] with current plan of care   Progression Toward Goals   Progression toward goals Progressing toward goals          SLP Education - 09/19/15 1414    Education provided Yes   Education Details relaxation techniques for vocal cord dysfucntion   Person(s) Educated Patient   Methods Explanation;Demonstration   Comprehension Verbalized understanding;Returned demonstration          SLP Short Term Goals - 09/12/15 1355    SLP SHORT TERM GOAL #1   Title pt will demo vocal relaxation techniques  independently over two sessions   Status Deferred  pt's voice no longer tense/tight   SLP SHORT TERM GOAL #2   Title pt will report decr in tense/strained/strangled voice by 50%   Status Achieved   SLP SHORT TERM GOAL #3   Title pt will increase sustained /a/ to average 14 seconds   Status Achieved          SLP Long Term Goals - 09/19/15 1419    SLP LONG TERM GOAL #1   Title pt will demo voice relaxation techniques independently over three session total   Status Deferred  tension not occuring enough to need vocal relaxation   SLP LONG TERM GOAL #2   Title pt will report decr in tense/strained/strangled voice by 75%   Status Achieved   SLP LONG TERM GOAL #3   Title pt will engage in 10 minutes conversation with WNL voice and no tension reported over two sessions   Baseline one session 09-12-15   Time 3   Period Weeks   Status Revised          Plan - 09/19/15 1415    Clinical Impression Statement Initially, pt exhibited tight/tense/strangled voice today with very intermittent WNL voicing. 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 Dry, hacking cough also during this time. With SLP-guided focus on abdominal breathing, teaching of and use of relaxation techniques used for pts with VCD, pt's voice quality approximated more WNL voicing (rated 8/10 by pt) in the last 15-20 minutes of her session. SLP believes pt's strained/strangled/tight voice is primarily due to external anxieties and stressors which then also cause VCD-like symptoms.    Speech Therapy Frequency 1x /week   Duration --  3 weeks   Treatment/Interventions SLP instruction and feedback;Internal/external aids;Patient/family education;Functional tasks   Potential to Achieve Goals Good   Potential Considerations Severity of impairments   Consulted and Agree with Plan of Care Patient      Patient will benefit from skilled therapeutic intervention in order to improve the following  deficits and impairments:   Voice disorder    Problem List Patient Active Problem List   Diagnosis Date Noted  . OSA (obstructive sleep apnea) 10/09/2014  . Fatigue 10/09/2014  . Cough 09/04/2014  . Wheezing 09/04/2014  . Vocal cord dysfunction 09/04/2014  . Midepigastric pain 06/04/2014  . Syncope 08/01/2013  . Palpitations 05/11/2013  . Obesity (BMI 30-39.9) 04/11/2013  . Personal history of colonic adenoma 06/28/2012  . Personal history of failed moderate sedation 06/20/2012  . [name] 06/19/2012  . Ejection fraction   . HTN (hypertension) 05/04/2012  . IBS (irritable bowel syndrome)   . Anxiety   . Depression   .  Hypothyroidism   . Migraine   . Precordial pain   . Chronic cough 09/10/2011  . Hallucinations 05/14/2010  . HEADACHE 05/14/2010  . B12 DEFICIENCY 02/17/2010  . BIPOLAR DISORDER UNSPECIFIED 10/23/2009  . HYPOKALEMIA 05/20/2009  . ASTERIXIS 12/20/2006  . HYPERLIPIDEMIA NEC/NOS 10/27/2006  . FASTING HYPERGLYCEMIA 10/27/2006    Noheli Melder ,MS, CCC-SLP  09/19/2015, 2:21 PM  [facility] [address] [place], [place], [address] Phone: [phone]   Fax:  [phone]   Name: Sarah Salazar MRN: [id number] Date of Birth: 10-10-1955

## 2015-09-19 NOTE — Patient Instructions (Signed)
Sniff - blow Feel tightness or scratchy feeling melt away or evaporate like a steam  ABDOMINAL BREATHING

## 2015-09-26 ENCOUNTER — Ambulatory Visit: Payer: 59

## 2015-09-26 DIAGNOSIS — R499 Unspecified voice and resonance disorder: Secondary | ICD-10-CM

## 2015-09-26 NOTE — Therapy (Signed)
[facility] [address] [place], [place], [address] Phone: [phone]   Fax:  [phone]  Speech Language Pathology Treatment  Patient Details  Name: Sarah Salazar MRN: [id number] Date of Birth: 06-13-1955 Referring Provider: Izora Gala  Encounter Date: 09/26/2015      End of Session - 09/26/15 1225    Visit Number 6   Number of Visits 8   Date for SLP Re-Evaluation 10/10/15   SLP Start Time 1151   SLP Stop Time  1230   SLP Time Calculation (min) 39 min   Activity Tolerance Patient tolerated treatment well      Past Medical History  Diagnosis Date  . IBS (irritable bowel syndrome)   . Anxiety   . Depression   . Hypothyroidism     hypothyroidism  . Chest pain     Emergency room April 23, 2012, troponin is normal, chest CT scan showed no pulmonary embolus  . Cough     Chronic cough  . Ejection fraction     EF 55-60%, echo, December, 2013, lipomatous hypertrophy of the atrial septum  . GERD (gastroesophageal reflux disease)   . Personal history of failed moderate sedation 06/20/2012  . Personal history of colonic adenoma 06/28/2012  . Complication of anesthesia     "I'm allergic to versed and fentanyl"  . Family history of anesthesia complication     "hard time waking daughter up post SVT ablation"  . Pneumonia     "couple times" (08/01/2013)  . H/O hiatal hernia   . Migraine     "once or twice" (08/01/2013)  . Bulging lumbar disc     "L3-5" (08/01/2013)  . Bipolar disorder [facility])     Past Surgical History  Procedure Laterality Date  . Cholecystectomy    . Dilation and curettage of uterus  X 3  . Knee arthroscopy Right X 2  . Wrist surgery  1979-~ 2010    X 4  . Laparoscopy  1970's - 06/03/1990    "several; to evaluate dysfunctional menses & pelvic pain"  . Cystocele repair  01/13/2009  . Esophagogastroduodenoscopy (egd) with esophageal dilation  2004; 2014  . Colonoscopy  2002  . Elbow surgery  Left 2005  . Breast lumpectomy Left     "1st breast OR"  . Mastectomy, partial Left     "2nd breast OR", benign fibrocystic changes  . Wisdom tooth extraction      "all 4 at once"    There were no vitals filed for this visit.      Subjective Assessment - 09/26/15 1205    Subjective Pt entered with WNL voice today, with intermittient vocal fry. "They (work) stopped calling me." Voice rated 7-8/10 (10=normal)   Currently in Pain? No/denies               ADULT SLP TREATMENT - 09/26/15 1207    General Information   Behavior/Cognition Alert;Cooperative;Pleasant mood   Treatment Provided   Treatment provided Cognitive-Linquistic   Cognitive-Linquistic Treatment   Treatment focused on Voice   Skilled Treatment Pt with WNL voicing with intermittent vocal fry with rare dry coughing throughout session. Pt reported she was tension free for the session. In strucutred tasks pt with WNL voicing focusing on eliminating vocal fry - 85% success.    Assessment / Recommendations / Plan   Plan --  d/c today - patient agrees this is appropriate time   Progression Toward Goals   Progression toward goals Goals met,  education completed, patient discharged from [facility] - 09/12/15 1355    [place] #1   Title pt will demo vocal relaxation techniques independently over two sessions   Status Deferred  pt's voice no longer tense/tight   SLP SHORT TERM GOAL #2   Title pt will report decr in tense/strained/strangled voice by 50%   Status Achieved   SLP SHORT TERM GOAL #3   Title pt will increase sustained /a/ to average 14 seconds   Status Achieved          SLP Long Term Goals - 09/26/15 1308    SLP LONG TERM GOAL #1   Title pt will demo voice relaxation techniques independently over three session total   Status Deferred  tension not occuring enough to need vocal relaxation   SLP LONG TERM GOAL #2   Title pt will report decr in  tense/strained/strangled voice by 75%   Status Achieved   SLP LONG TERM GOAL #3   Title pt will engage in 10 minutes conversation with WNL voice and no tension reported over two sessions   Status Achieved     Clinical impressions Statement: Pt's voice today with essentially WNL voicing. Dry cough noted intermittently throughout session. SLP cont's to believe pt's strained/strangled/tight voice is primarily due to external anxieties and stressors which also exacerbate VCD-like symptoms.  Pt agrees d/c is appropriate today.  See goal update for more details.                                         Patient will benefit from skilled therapeutic intervention in order to improve the following deficits and impairments:   Voice disorder   SPEECH THERAPY DISCHARGE SUMMARY  Visits from Start of Care: 6  Current functional level related to goals / functional outcomes: Pt met all goals, with goals for tension deferred (see goal summary). When pt was focused on her work situation and other stressors in her life, voice was strained/strangled/tight quality. When able to focus on other things and on relaxation of throat/laryngeal area, pt's voice was WNL.   Remaining deficits: Mild hoarseness, intermittent. Minimal vocal fry due to reduced breath support.   Education / Equipment: Abdominal breathing, relaxation techniques for vocal relaxation and VCD symptoms/signs.  Plan: Patient agrees to discharge.  Patient goals were met. Patient is being discharged due to meeting the stated rehab goals.  ?????      Problem List Patient Active Problem List   Diagnosis Date Noted  . OSA (obstructive sleep apnea) 10/09/2014  . Fatigue 10/09/2014  . Cough 09/04/2014  . Wheezing 09/04/2014  . Vocal cord dysfunction 09/04/2014  . Midepigastric pain 06/04/2014  . Syncope 08/01/2013  . Palpitations 05/11/2013  . Obesity (BMI 30-39.9) 04/11/2013  . Personal history of colonic adenoma 06/28/2012   . Personal history of failed moderate sedation 06/20/2012  . [name] 06/19/2012  . Ejection fraction   . HTN (hypertension) 05/04/2012  . IBS (irritable bowel syndrome)   . Anxiety   . Depression   . Hypothyroidism   . Migraine   . Precordial pain   . Chronic cough 09/10/2011  . Hallucinations 05/14/2010  . HEADACHE 05/14/2010  . B12 DEFICIENCY 02/17/2010  . BIPOLAR DISORDER UNSPECIFIED 10/23/2009  . HYPOKALEMIA 05/20/2009  . ASTERIXIS 12/20/2006  . HYPERLIPIDEMIA  NEC/NOS 10/27/2006  . FASTING HYPERGLYCEMIA 10/27/2006    SCHINKE,CARL ,MS, CCC-SLP  09/26/2015, 2:23 PM  [facility] [address] [place], [place], [address] Phone: [phone]   Fax:  [phone]   Name: SALIYAH GILLIN MRN: [id number] Date of Birth: 12/30/1955

## 2015-09-29 ENCOUNTER — Other Ambulatory Visit: Payer: Self-pay | Admitting: Endocrinology

## 2015-10-24 ENCOUNTER — Other Ambulatory Visit: Payer: Self-pay | Admitting: Orthopedic Surgery

## 2015-11-02 ENCOUNTER — Other Ambulatory Visit: Payer: Self-pay | Admitting: Family Medicine

## 2015-12-12 ENCOUNTER — Telehealth: Payer: Self-pay | Admitting: Family Medicine

## 2015-12-12 MED ORDER — METOPROLOL TARTRATE 25 MG PO TABS: ORAL_TABLET | ORAL | 0 refills | Status: DC

## 2015-12-12 NOTE — Telephone Encounter (Signed)
°  Relationship to patient: Self  Can be reached: [phone]   Pharmacy:  [facility] [place], [place] - [place] [phone] (Phone) [phone] (Fax)     Reason for call: Request refill on metoprolol tartrate (LOPRESSOR) 25 MG tablet [id number]

## 2015-12-12 NOTE — Telephone Encounter (Signed)
Rx faxed.    KP 

## 2015-12-22 ENCOUNTER — Encounter (HOSPITAL_BASED_OUTPATIENT_CLINIC_OR_DEPARTMENT_OTHER): Payer: Self-pay | Admitting: *Deleted

## 2015-12-25 ENCOUNTER — Ambulatory Visit (HOSPITAL_BASED_OUTPATIENT_CLINIC_OR_DEPARTMENT_OTHER): Admission: RE | Admit: 2015-12-25 | Payer: 59 | Source: Ambulatory Visit | Admitting: Orthopedic Surgery

## 2015-12-25 HISTORY — DX: Essential (primary) hypertension: I10

## 2015-12-25 HISTORY — DX: Sleep apnea, unspecified: G47.30

## 2015-12-25 HISTORY — DX: Unspecified osteoarthritis, unspecified site: M19.90

## 2015-12-25 SURGERY — CARPAL TUNNEL RELEASE
Anesthesia: General | Laterality: Left

## 2016-01-13 ENCOUNTER — Other Ambulatory Visit: Payer: 59

## 2016-01-16 ENCOUNTER — Ambulatory Visit: Payer: 59 | Admitting: Endocrinology

## 2016-02-09 ENCOUNTER — Telehealth: Payer: Self-pay | Admitting: Family Medicine

## 2016-02-09 NOTE — Telephone Encounter (Signed)
Patient Name: Sarah Salazar  DOB: May 31, 1955    Initial Comment Caller states, she was hit in the head with a sealing fan - has a staple in her head. Still having headaches. Verified.    Nurse Assessment  Nurse: Raphael Gibney, RN, Vanita Ingles Date/Time (Eastern Time): 02/09/2016 2:22:35 PM  Confirm and document reason for call. If symptomatic, describe symptoms. You must click the next button to save text entered. ---Caller states she was painting the room and going up the ladder and hit the ceiling fan that was going on high yesterday. Had a little gash in her head and went to the ER and she has a staple in her head. has appt next Monday. Has a severe headache. Advil is not touching the headache. Did not have a CT scan. Pt is at work and using a Teaching laboratory technician. No vomiting.  Has the patient traveled out of the country within the last 30 days? ---No  Does the patient have any new or worsening symptoms? ---Yes  Will a triage be completed? ---Yes  Related visit to physician within the last 2 weeks? ---Yes  Does the PT have any chronic conditions? (i.e. diabetes, asthma, etc.) ---Yes  List chronic conditions. ---tachycardia  Is this a behavioral health or substance abuse call? ---No     Guidelines    Guideline Title Affirmed Question Affirmed Notes  Head Injury [1] SEVERE headache AND [2] not improved 2 hours after pain medicine/ice packs    Final Disposition User   Go to ED Now (or PCP triage) Raphael Gibney, RN, Vera    Comments  Pt does not want to come to the office or go to the ER or urgent care.  Called office and spoke to [place] and gave report pt was painting the room and going up the ladder and hit the ceiling fan that was going on high yesterday. Had a little gash in her head and went to the ER and she has a staple in her head. has appt next Monday. Has a severe headache. Advil is not touching the headache. Did not have a CT scan. Pt is at work and using a Teaching laboratory technician.   Referrals  GO TO FACILITY REFUSED    Disagree/Comply: Disagree  Disagree/Comply Reason: Disagree with instructions

## 2016-02-11 NOTE — Telephone Encounter (Signed)
Pt has a follow up appt with Dr. Carollee Herter on 02/17/16.

## 2016-02-17 ENCOUNTER — Ambulatory Visit: Payer: 59 | Admitting: Family Medicine

## 2016-02-18 ENCOUNTER — Ambulatory Visit (INDEPENDENT_AMBULATORY_CARE_PROVIDER_SITE_OTHER): Payer: Self-pay | Admitting: Family Medicine

## 2016-02-18 ENCOUNTER — Encounter: Payer: Self-pay | Admitting: Family Medicine

## 2016-02-18 VITALS — BP 130/68 | HR 63 | Temp 98.6°F | Ht 65.0 in | Wt 181.8 lb

## 2016-02-18 DIAGNOSIS — Z23 Encounter for immunization: Secondary | ICD-10-CM

## 2016-02-18 DIAGNOSIS — Z4802 Encounter for removal of sutures: Secondary | ICD-10-CM | POA: Diagnosis not present

## 2016-02-18 DIAGNOSIS — N3001 Acute cystitis with hematuria: Secondary | ICD-10-CM

## 2016-02-18 LAB — POC URINALSYSI DIPSTICK (AUTOMATED)
Bilirubin, UA: NEGATIVE
Glucose, UA: NEGATIVE
Ketones, UA: NEGATIVE
Nitrite, UA: NEGATIVE
Protein, UA: NEGATIVE
Spec Grav, UA: 1.02
Urobilinogen, UA: 0.2
pH, UA: 6

## 2016-02-18 MED ORDER — NITROFURANTOIN MONOHYD MACRO 100 MG PO CAPS: 100.0000 mg | ORAL_CAPSULE | Freq: Two times a day (BID) | ORAL | 0 refills | Status: AC

## 2016-02-18 MED FILL — NITROFURANTOIN MONO-MCR 100: 100 | 5 days supply | Qty: 10 | Fill #0

## 2016-02-18 NOTE — Progress Notes (Signed)
Chief Complaint  Patient presents with  . Suture / Staple Removal    (L) side of the head    Subjective: Patient is a 60 y.o. female here for staple removal.  Located on L side of head. Happened on 02/08/16 when she was painting her ceiling fan and hit her head. Went to ED and got 1 staple.  Frequent urination For 1 day, has been having frequent urination and incomplete emptying. Cloudy appearance of urine as well. No pain, blood, fevers or abdominal pain. No hx of recurrent UTI.  ROS: GU: +frequency, no pain  Objective: BP 130/68 (BP Location: Left Arm, Patient Position: Sitting, Cuff Size: Large)   Pulse 63   Temp 98.6 F (37 C) (Oral)   Ht 5\' 5"  (1.651 m)   Wt 181 lb 12.8 oz (82.5 kg)   SpO2 98%   BMI 30.25 kg/m   Gen: awake, appearing stated age Heart: RRR, no murmurs Lungs: CTAB, no accessory muscle use Abd: BS+, soft, TTP in lower quadrants, no guarding MSK: No CVA tenderness Skin: Well healed laceration with 1 staple on L occipital portion of scalp. No surrounding erythema or drainage. Psych: Age appropriate judgment and insight, normal affect and mood  Assessment and Plan: Encounter for staple removal  Acute cystitis with hematuria - Plan: nitrofurantoin, macrocrystal-monohydrate, (MACROBID) 100 MG capsule  UA shows +blood and LE's, will tx. Flu shot today. 1 staple removed successfully, pt tolerated it well. F/u prn. The patient voiced understanding and agreement to the plan.  [place], DO 02/18/16  4:57 PM

## 2016-02-18 NOTE — Progress Notes (Signed)
Pre visit review using our clinic review tool, if applicable. No additional management support is needed unless otherwise documented below in the visit note. 

## 2016-02-18 NOTE — Addendum Note (Signed)
Addended by: Harl Bowie on: 02/18/2016 05:16 PM   Modules accepted: Orders

## 2016-02-23 ENCOUNTER — Ambulatory Visit: Payer: 59 | Admitting: Adult Health

## 2016-05-07 ENCOUNTER — Encounter: Payer: Self-pay | Admitting: Medical

## 2016-05-07 ENCOUNTER — Ambulatory Visit (INDEPENDENT_AMBULATORY_CARE_PROVIDER_SITE_OTHER): Payer: BLUE CROSS/BLUE SHIELD | Admitting: Medical

## 2016-05-07 VITALS — BP 159/74 | HR 55 | Temp 98.0°F | Resp 16 | Ht 65.0 in | Wt 183.1 lb

## 2016-05-07 DIAGNOSIS — R110 Nausea: Secondary | ICD-10-CM | POA: Diagnosis not present

## 2016-05-07 DIAGNOSIS — R197 Diarrhea, unspecified: Secondary | ICD-10-CM | POA: Diagnosis not present

## 2016-05-07 DIAGNOSIS — J0100 Acute maxillary sinusitis, unspecified: Secondary | ICD-10-CM

## 2016-05-07 DIAGNOSIS — E039 Hypothyroidism, unspecified: Secondary | ICD-10-CM

## 2016-05-07 DIAGNOSIS — F3160 Bipolar disorder, current episode mixed, unspecified: Secondary | ICD-10-CM

## 2016-05-07 LAB — TSH: TSH: 2.5 u[IU]/mL (ref 0.35–4.50)

## 2016-05-07 LAB — T4, FREE: Free T4: 0.87 ng/dL (ref 0.60–1.60)

## 2016-05-07 MED ORDER — PROMETHAZINE HCL 12.5 MG PO TABS: 12.5000 mg | ORAL_TABLET | Freq: Three times a day (TID) | ORAL | 0 refills | Status: DC | PRN

## 2016-05-07 MED ORDER — AZITHROMYCIN 250 MG PO TABS: ORAL_TABLET | ORAL | 0 refills | Status: DC

## 2016-05-07 MED ORDER — FLUTICASONE PROPIONATE 50 MCG/ACT NA SUSP: 2.0000 | Freq: Every day | NASAL | 1 refills | Status: DC

## 2016-05-07 MED FILL — AZITHROMYCIN 250 MG TABLET: 250 | 5 days supply | Qty: 6 | Fill #0

## 2016-05-07 MED FILL — PROMETHAZINE 12.5 MG TABLET: 12.5 | 7 days supply | Qty: 20 | Fill #0

## 2016-05-07 MED FILL — FLUTICASONE PROP 50 MCG SPR: 50 | 30 days supply | Qty: 16 | Fill #0

## 2016-05-07 NOTE — Patient Instructions (Addendum)
You appear to have a sinus infection. I am prescribing azithromycin antibiotic for the infection. To help with the nasal congestion I prescribed  flonase nasal steroid.   For nausea will rx phenergan.   Your diarrhea seems to have resolved but early. Unfortunately you area on antibiotic and this may loosen stool Eat bland foods and hydrate well.  Will repeat your tsh and t4 today. Then restart meds.  For you bipolar please call behavioral health. Notify us when you do so we can call them as well and try to expidite the referral. If mood worsens before appointment notify us or ED evaluation.  Rest, hydrate, tylenol for fever.  Follow up in 7 days or as needed.

## 2016-05-07 NOTE — Progress Notes (Signed)
Subjective:    Patient ID: Sarah Salazar, female    DOB: October 18, 1955, 61 y.o.   MRN: [id number]  HPI  Pt had 4 days of sinus pressure/sinus pain and sensitive nostril. Mild mucous drainage. No fever, and no sweats. Some chills.   Pt had some nausea since Monday. Pt also had some mild loose stools. 3-4 times a day. Last 24 hours no loose stools. Nausea mild and persisting. No abd pain.  Pt has not had thyroid test done in a while. She has been out of town living near [place].  Also pt has history of some bipolar history. She has been of wellbutrin and lamictal in past but none since August. He MD retired. Pt wants referral to behavioral health. She does not want meds today just wants to be referred. She has information to call behavioral.   Review of Systems  Constitutional: Positive for chills. Negative for diaphoresis, fatigue and fever.  HENT: Positive for congestion, postnasal drip, sinus pain and sinus pressure.   Respiratory: Negative for cough, chest tightness, shortness of breath and wheezing.   Cardiovascular: Negative for chest pain and palpitations.  Gastrointestinal: Positive for diarrhea. Negative for abdominal pain, constipation, nausea, rectal pain and vomiting.  Musculoskeletal: Negative for back pain.  Skin: Negative for rash.  Neurological: Negative for dizziness, facial asymmetry, weakness and headaches.  Hematological: Negative for adenopathy. Does not bruise/bleed easily.  Psychiatric/Behavioral:       Pt states he mood is stable.   Past Medical History:  Diagnosis Date  . Anxiety   . Arthritis    both thumbs  . Bipolar disorder ([place])   . Bulging lumbar disc    "L3-5" (08/01/2013)  . Chest pain    Emergency room April 23, 2012, troponin is normal, chest CT scan showed no pulmonary embolus  . Complication of anesthesia    "I'm allergic to versed and fentanyl"  . Cough    Chronic cough  . Depression   . Ejection fraction    EF 55-60%, echo, December,  2013, lipomatous hypertrophy of the atrial septum  . Family history of anesthesia complication    "hard time waking daughter up post SVT ablation"  . GERD (gastroesophageal reflux disease)   . Hypertension   . Hypothyroidism    hypothyroidism  . IBS (irritable bowel syndrome)   . Personal history of colonic adenoma 06/28/2012  . Personal history of failed moderate sedation 06/20/2012  . Pneumonia    "couple times" (08/01/2013)  . Sleep apnea    does not use CPAP     Social History   Social History  . Marital status: Divorced    Spouse name: N/A  . Number of children: 1  . Years of education: N/A   Occupational History  .  [employer]   Social History Main Topics  . Smoking status: Never Smoker  . Smokeless tobacco: Never Used  . Alcohol use Yes     Comment: 08/01/2013 "glass of wine 6 times/year at most"  . Drug use: No  . Sexual activity: Yes    Birth control/ protection: Post-menopausal   Other Topics Concern  . 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 BREAST LUMPECTOMY Left    "1st breast OR"  .  CHOLECYSTECTOMY    . COLONOSCOPY  2002  . CYSTOCELE REPAIR  01/13/2009  . DILATION AND CURETTAGE OF UTERUS  X 3  . ELBOW SURGERY Left 2005  . ESOPHAGOGASTRODUODENOSCOPY (EGD) WITH ESOPHAGEAL DILATION  2004; 2014  . KNEE ARTHROSCOPY Right X 2  . LAPAROSCOPY  1970's - 06/03/1990   "several; to evaluate dysfunctional menses & pelvic pain"  . MASTECTOMY, PARTIAL Left    "2nd breast OR", benign fibrocystic changes  . WISDOM TOOTH EXTRACTION     "all 4 at once"  . WRIST SURGERY  1979-~ 2010   X 4    Family History  Problem Relation Age of Onset  . Coronary artery disease Mother   . Stroke Mother     TIA  . Uterine cancer Mother     BREAST  . Thyroid disease Mother     goiter  . Lung cancer Father   .  Heart disease Father   . COPD Father   . Coronary artery disease Father   . Colon cancer Paternal Uncle     COLON; STOMACH  . Lung cancer Maternal Grandfather   . Rheum arthritis Maternal Aunt   . Supraventricular tachycardia Daughter     S/P ablation  . Thyroid disease Cousin     Allergies  Allergen Reactions  . Amoxicillin Rash       . Fentanyl Rash    given with Versed  . Midazolam Rash    given with Fentanyl  . Oxycodone-Aspirin Rash  . Sulfonamide Derivatives Rash    rash  . Hydrocodone Hives and Nausea And Vomiting  . Nalbuphine Nausea And Vomiting  . Oxycodone-Acetaminophen Hives and Nausea And Vomiting  . Venlafaxine Other (See Comments)    Patient says makes her crazy  . Verapamil Palpitations    Caused "pounding " in chest    Current Outpatient Prescriptions on File Prior to Visit  Medication Sig Dispense Refill  . ALPRAZolam (XANAX) 1 MG tablet Take 1 tablet (1 mg total) by mouth 3 (three) times daily as needed. 60 tablet 0  . hyoscyamine (LEVSIN SL) 0.125 MG SL tablet Place 1 tablet (0.125 mg total) under the tongue every 4 (four) hours as needed. 90 tablet 3  . metaxalone (SKELAXIN) 800 MG tablet as needed.     . metoprolol tartrate (LOPRESSOR) 25 MG tablet TAKE ONE-HALF (1/2) TABLET TWICE A DAY 180 tablet 0  . pantoprazole (PROTONIX) 40 MG tablet TAKE 1 TABLET DAILY 90 tablet 1  . promethazine (PHENERGAN) 25 MG tablet Take 1 tablet (25 mg total) by mouth every 6 (six) hours as needed for nausea or vomiting. 90 tablet 3  . traMADol (ULTRAM) 50 MG tablet Take 1 tablet (50 mg total) by mouth every 6 (six) hours as needed. 30 tablet 0  . ARMOUR THYROID 90 MG tablet TAKE ONE-HALF (1/2) TABLET DAILY (Patient not taking: Reported on 05/07/2016) 45 tablet 1  . buPROPion (WELLBUTRIN) 100 MG tablet Take 1 tablet by mouth 2 (two) times daily.    Marland Kitchen lamoTRIgine (LAMICTAL) 100 MG tablet Take 1 tablet by mouth every other day.     No current facility-administered  medications on file prior to visit.     BP (!) 159/74 (BP Location: Right Arm, Patient Position: Sitting, Cuff Size: Large)   Pulse (!) 55   Temp 98 F (36.7 C) (Oral)   Resp 16   Ht 5\' 5"  (1.651 m)   Wt 183 lb 2 oz (83.1 kg)   SpO2 98%  BMI 30.47 kg/m       Objective:   Physical Exam  General  Mental Status - Alert. General Appearance - Well groomed. Not in acute distress.normal affect.  Skin Rashes- No Rashes.  HEENT Head- Normal. Ear Auditory Canal - Left- Normal. Right - Normal.Tympanic Membrane- Left- Normal. Right- Normal. Eye Sclera/Conjunctiva- Left- Normal. Right- Normal. Nose & Sinuses Nasal Mucosa- Left-  Boggy and Congested. Right-  Boggy and  Congested.Bilateral maxillary and frontal sinus pressure. Mouth & Throat Lips: Upper Lip- Normal: no dryness, cracking, pallor, cyanosis, or vesicular eruption. Lower Lip-Normal: no dryness, cracking, pallor, cyanosis or vesicular eruption. Buccal Mucosa- Bilateral- No Aphthous ulcers. Oropharynx- No Discharge or Erythema. Tonsils: Characteristics- Bilateral- No Erythema or Congestion. Size/Enlargement- Bilateral- No enlargement. Discharge- bilateral-None.  Neck Neck- Supple. No Masses.   Chest and Lung Exam Auscultation: Breath Sounds:-Clear even and unlabored.  Cardiovascular Auscultation:Rythm- Regular, rate and rhythm. Murmurs & Other Heart Sounds:Ausculatation of the heart reveal- No Murmurs.  Lymphatic Head & Neck General Head & Neck Lymphatics: Bilateral: Description- No Localized lymphadenopathy.   Abdomen Inspection:-Inspection Normal.  Palpation/Perucssion: Palpation and Percussion of the abdomen reveal- Non Tender, No Rebound tenderness, No rigidity(Guarding) and No Palpable abdominal masses.  Liver:-Normal.  Spleen:- Normal.   Back- no cva tenderness       Assessment & Plan:  You appear to have a sinus infection. I am prescribing azithromycin antibiotic for the infection. To help with  the nasal congestion I prescribed  flonase nasal steroid.   For nausea will rx phenergan.   Your diarrhea seems to have resolved but early. Unfortunately you area on antibiotic and this may loosen stool Eat bland foods and hydrate well.  Will repeat your tsh and t4 today. Then restart meds.  For you bipolar please call behavioral health. Notify us when you do so we can call them as well and try to expidite the referral.  Rest, hydrate, tylenol for fever.  Follow up in 7 days or as needed.

## 2016-05-07 NOTE — Progress Notes (Signed)
Pre visit review using our clinic review tool, if applicable. No additional management support is needed unless otherwise documented below in the visit note/SLS  

## 2016-05-13 ENCOUNTER — Telehealth: Payer: Self-pay | Admitting: Family Medicine

## 2016-05-13 NOTE — Telephone Encounter (Signed)
levaquin 500 mg 1 po qd x 7 days  flonase if she does not have it xyzal 1 po qd #30  5 refills

## 2016-05-13 NOTE — Telephone Encounter (Signed)
Pt called back in to follow up on message sent . Pt says that she has a fever 100.2. She has been taking tylenol. Pt says that she has been sick for 2 weeks.   If a nurse could please advise pt as soon as possible.     [facility]

## 2016-05-13 NOTE — Telephone Encounter (Signed)
Pt called in because she seen provider Mackie Pai 05/07/16. She received a antibiotic and nasal spray. Pt says that she's not feeling any better after completing both. She would like to be advised further.     CB: [phone]

## 2016-05-14 MED ORDER — PROMETHAZINE-DM 6.25-15 MG/5ML PO SYRP: 2.5000 mL | ORAL_SOLUTION | Freq: Four times a day (QID) | ORAL | 0 refills | Status: DC | PRN

## 2016-05-14 MED ORDER — FLUTICASONE PROPIONATE 50 MCG/ACT NA SUSP: 2.0000 | Freq: Every day | NASAL | 1 refills | Status: DC

## 2016-05-14 MED ORDER — OSELTAMIVIR PHOSPHATE 75 MG PO CAPS: 75.0000 mg | ORAL_CAPSULE | Freq: Two times a day (BID) | ORAL | 0 refills | Status: DC

## 2016-05-14 MED ORDER — LEVOCETIRIZINE DIHYDROCHLORIDE 5 MG PO TABS: 5.0000 mg | ORAL_TABLET | Freq: Every evening | ORAL | 5 refills | Status: DC

## 2016-05-14 MED ORDER — LEVOFLOXACIN 500 MG PO TABS: 500.0000 mg | ORAL_TABLET | Freq: Every day | ORAL | 0 refills | Status: DC

## 2016-05-14 NOTE — Telephone Encounter (Signed)
Is she having body aches?   May needs tamiflu 75 mg bid x 5 days  Phenergan/dextrom  1 tsp qid prn cough #120 cc

## 2016-05-14 NOTE — Telephone Encounter (Signed)
Message not routed to nurses 05/14/15, sent medications over to pharmacy this Am and called patient to inform and also get a better status on her condition. Patient is taking Tylenol and/or Advil and still having 100-101 fever in the evening, informed her to keep taking the Acetaminophen for the fever. Patient states that the Benzonatate did not help with cough, she is using Robitussin DM and still having a very bad dry cough and request a cough medication to help with this. Patient informed that I would relay the message to her provider and call her back; pt also informed of emergent symptoms [high fever, dehydration,uncontrolled vomiting and/or diarrhea, any trouble breathing] that she would need to seek treatment for over the weekend and if no better by Monday to call office to come back in for reassessment; she understood and agreed/SLS 01/12 Please Advise on Rx for cough/SLS

## 2016-05-14 NOTE — Telephone Encounter (Signed)
Rx to pharmacy per provider instructions; patient informed, understood & agreed; reiterated emergent symptoms for weekend & call back to be reassessed, if no better by Monday/SLS 01/12

## 2016-05-16 ENCOUNTER — Emergency Department (HOSPITAL_COMMUNITY): Payer: BLUE CROSS/BLUE SHIELD

## 2016-05-16 ENCOUNTER — Encounter (HOSPITAL_COMMUNITY): Payer: Self-pay | Admitting: *Deleted

## 2016-05-16 ENCOUNTER — Emergency Department (HOSPITAL_COMMUNITY)
Admission: EM | Admit: 2016-05-16 | Discharge: 2016-05-16 | Disposition: A | Discharge status: Home / Self Care | Payer: BLUE CROSS/BLUE SHIELD | Attending: Emergency Medicine | Admitting: Emergency Medicine

## 2016-05-16 DIAGNOSIS — J069 Acute upper respiratory infection, unspecified: Secondary | ICD-10-CM | POA: Insufficient documentation

## 2016-05-16 DIAGNOSIS — I10 Essential (primary) hypertension: Secondary | ICD-10-CM | POA: Diagnosis not present

## 2016-05-16 DIAGNOSIS — R05 Cough: Secondary | ICD-10-CM | POA: Diagnosis present

## 2016-05-16 DIAGNOSIS — Z79899 Other long term (current) drug therapy: Secondary | ICD-10-CM | POA: Insufficient documentation

## 2016-05-16 DIAGNOSIS — B9789 Other viral agents as the cause of diseases classified elsewhere: Secondary | ICD-10-CM

## 2016-05-16 DIAGNOSIS — E039 Hypothyroidism, unspecified: Secondary | ICD-10-CM | POA: Insufficient documentation

## 2016-05-16 MED ORDER — ALBUTEROL SULFATE HFA 108 (90 BASE) MCG/ACT IN AERS
2.0000 | INHALATION_SPRAY | Freq: Once | RESPIRATORY_TRACT | Status: AC
  Administered 2016-05-16: 2 via RESPIRATORY_TRACT
  Filled 2016-05-16: qty 6.7

## 2016-05-16 NOTE — ED Triage Notes (Signed)
The pt is c/o numerus complaints for 2 weeks cough cold sinus congestion chest congestion  Low grade temps  .  Non-productivecolugh   No fever reducer today.  She was seen by her regular doctor  One week ago  She has had med given and called in for her.  Not feeling any better

## 2016-05-16 NOTE — ED Notes (Signed)
Pt verbalized understanding discharge instructions and denies any further needs or questions at this time. VS stable, ambulatory and steady gait.   

## 2016-05-16 NOTE — ED Provider Notes (Signed)
[place] DEPT Provider Note   CSN: [id number] Arrival date & time: 05/16/16  1450     History   Chief Complaint Chief Complaint  Patient presents with  . Cough    HPI Sarah Salazar is a 61 y.o. female.  HPI  61 yo female with 3 wk cough congestion--initially lasted a few days before going to doctor. Diagnosed with sinus infection, given abx, improved for one day then last weekend began to get worse, subjective fever, chills.  Symptoms began on Thursday and have been persistent.  New cough, congestion, fever, general malaise. Called into doctors office yesterday and got rx for tamiflu and phenergan.  Vomited after taking tamiflu and stopped taking it. SOB with activity. Pain in shoulder/back with coughing, general weakness. No sick contacts. Did have flu shot. Had one episode of diarrhea yesterday.  Husband reports she was having noisy breathing/wheezing while she was sleeping and was difficult to arouse from sleep and he became concerned, and in particular wanted to check her oxygen saturation.  Reports his ex-wife had passed away in her sleep after going to bed feeling sick and that experience likely contributed to his concerns this AM.    Past Medical History:  Diagnosis Date  . Anxiety   . Arthritis    both thumbs  . Bipolar disorder ([place])   . Bulging lumbar disc    "L3-5" (08/01/2013)  . Chest pain    Emergency room April 23, 2012, troponin is normal, chest CT scan showed no pulmonary embolus  . Complication of anesthesia    "I'm allergic to versed and fentanyl"  . Cough    Chronic cough  . Depression   . Ejection fraction    EF 55-60%, echo, December, 2013, lipomatous hypertrophy of the atrial septum  . Family history of anesthesia complication    "hard time waking daughter up post SVT ablation"  . GERD (gastroesophageal reflux disease)   . Hypertension   . Hypothyroidism    hypothyroidism  . IBS (irritable bowel syndrome)   . Personal history of colonic  adenoma 06/28/2012  . Personal history of failed moderate sedation 06/20/2012  . Pneumonia    "couple times" (08/01/2013)  . Sleep apnea    does not use CPAP    Patient Active Problem List   Diagnosis Date Noted  . OSA (obstructive sleep apnea) 10/09/2014  . Vocal cord dysfunction 09/04/2014  . Syncope 08/01/2013  . Palpitations 05/11/2013  . Obesity (BMI 30-39.9) 04/11/2013  . Personal history of colonic adenoma 06/28/2012  . Personal history of failed moderate sedation 06/20/2012  . [name] 06/19/2012  . Ejection fraction   . HTN (hypertension) 05/04/2012  . IBS (irritable bowel syndrome)   . Anxiety   . Depression   . Hypothyroidism   . Migraine   . Chronic cough 09/10/2011  . Hallucinations 05/14/2010  . B12 DEFICIENCY 02/17/2010  . BIPOLAR DISORDER UNSPECIFIED 10/23/2009  . HYPOKALEMIA 05/20/2009  . ASTERIXIS 12/20/2006  . HYPERLIPIDEMIA NEC/NOS 10/27/2006  . FASTING HYPERGLYCEMIA 10/27/2006    Past Surgical History:  Procedure Laterality Date  . BREAST LUMPECTOMY Left    "1st breast OR"  . CHOLECYSTECTOMY    . COLONOSCOPY  2002  . CYSTOCELE REPAIR  01/13/2009  . DILATION AND CURETTAGE OF UTERUS  X 3  . ELBOW SURGERY Left 2005  . ESOPHAGOGASTRODUODENOSCOPY (EGD) WITH ESOPHAGEAL DILATION  2004; 2014  . KNEE ARTHROSCOPY Right X 2  . LAPAROSCOPY  1970's - 06/03/1990   "several; to evaluate dysfunctional  menses & pelvic pain"  . MASTECTOMY, PARTIAL Left    "2nd breast OR", benign fibrocystic changes  . WISDOM TOOTH EXTRACTION     "all 4 at once"  . WRIST SURGERY  1979-~ 2010   X 4    OB History    No data available       Home Medications    Prior to Admission medications   Medication Sig Start Date End Date Taking? Authorizing Provider  ALPRAZolam Duanne Moron) 1 MG tablet Take 1 tablet (1 mg total) by mouth 3 (three) times daily as needed. 08/26/15   Yvonne R Lowne Chase, DO  ARMOUR THYROID 90 MG tablet TAKE ONE-HALF (1/2) TABLET DAILY Patient not taking:  Reported on 05/07/2016 09/29/15   Elayne Snare, MD  buPROPion (WELLBUTRIN) 100 MG tablet Take 1 tablet by mouth 2 (two) times daily.    Historical Provider, MD  fluticasone (FLONASE) 50 MCG/ACT nasal spray Place 2 sprays into both nostrils daily. 05/14/16   Alferd Apa Lowne Chase, DO  hyoscyamine (LEVSIN SL) 0.125 MG SL tablet Place 1 tablet (0.125 mg total) under the tongue every 4 (four) hours as needed. 06/10/15   Gatha Mayer, MD  lamoTRIgine (LAMICTAL) 100 MG tablet Take 1 tablet by mouth every other day.    Historical Provider, MD  levocetirizine (XYZAL) 5 MG tablet Take 1 tablet (5 mg total) by mouth every evening. 05/14/16   Alferd Apa Lowne Chase, DO  levofloxacin (LEVAQUIN) 500 MG tablet Take 1 tablet (500 mg total) by mouth daily. 05/14/16   Alferd Apa Lowne Chase, DO  metaxalone (SKELAXIN) 800 MG tablet as needed.  03/26/15   Historical Provider, MD  metoprolol tartrate (LOPRESSOR) 25 MG tablet TAKE ONE-HALF (1/2) TABLET TWICE A DAY 12/12/15   Yvonne R Lowne Chase, DO  oseltamivir (TAMIFLU) 75 MG capsule Take 1 capsule (75 mg total) by mouth 2 (two) times daily. 05/14/16   Rosalita Chessman Chase, DO  pantoprazole (PROTONIX) 40 MG tablet TAKE 1 TABLET DAILY 11/03/15   Rosalita Chessman Chase, DO  promethazine (PHENERGAN) 12.5 MG tablet Take 1 tablet (12.5 mg total) by mouth every 8 (eight) hours as needed for nausea or vomiting. 05/07/16   Percell Miller Saguier, PA-C  promethazine (PHENERGAN) 25 MG tablet Take 1 tablet (25 mg total) by mouth every 6 (six) hours as needed for nausea or vomiting. 06/10/15   Gatha Mayer, MD  promethazine-dextromethorphan (PROMETHAZINE-DM) 6.25-15 MG/5ML syrup Take 2.5 mLs by mouth 4 (four) times daily as needed for cough. 05/14/16   Rosalita Chessman Chase, DO  traMADol (ULTRAM) 50 MG tablet Take 1 tablet (50 mg total) by mouth every 6 (six) hours as needed. 03/13/15   Ann Held, DO    Family History Family History  Problem Relation Age of Onset  . Coronary artery disease Mother    . Stroke Mother     TIA  . Uterine cancer Mother     BREAST  . Thyroid disease Mother     goiter  . Lung cancer Father   . Heart disease Father   . COPD Father   . Coronary artery disease Father   . Colon cancer Paternal Uncle     COLON; STOMACH  . Lung cancer Maternal Grandfather   . Rheum arthritis Maternal Aunt   . Supraventricular tachycardia Daughter     S/P ablation  . Thyroid disease Cousin     Social History Social History  Substance Use Topics  . Smoking status:  Never Smoker  . Smokeless tobacco: Never Used  . Alcohol use Yes     Comment: 08/01/2013 "glass of wine 6 times/year at most"     Allergies   Amoxicillin; Fentanyl; Midazolam; Oxycodone-aspirin; Sulfonamide derivatives; Hydrocodone; Nalbuphine; Oxycodone-acetaminophen; Venlafaxine; and Verapamil   Review of Systems Review of Systems  Constitutional: Positive for appetite change, chills, fatigue and fever (subjective).  HENT: Positive for congestion. Negative for sore throat.   Eyes: Negative for visual disturbance.  Respiratory: Positive for cough, shortness of breath (when doing things or coughing a lot) and wheezing.   Cardiovascular: Negative for chest pain.  Gastrointestinal: Positive for diarrhea, nausea and vomiting. Negative for abdominal pain.  Genitourinary: Negative for difficulty urinating.  Musculoskeletal: Positive for back pain and myalgias. Negative for neck pain.  Skin: Negative for rash.  Neurological: Negative for syncope and headaches.     Physical Exam Updated Vital Signs BP 145/96   Pulse 77   Temp 98 F (36.7 C) (Oral)   Resp 17   Ht 5\' 5"  (1.651 m)   Wt 183 lb (83 kg)   SpO2 97%   BMI 30.45 kg/m   Physical Exam  Constitutional: She is oriented to person, place, and time. She appears well-developed and well-nourished. No distress.  HENT:  Head: Normocephalic and atraumatic.  Eyes: Conjunctivae and EOM are normal.  Neck: Normal range of motion.  Cardiovascular:  Normal rate, regular rhythm, normal heart sounds and intact distal pulses.  Exam reveals no gallop and no friction rub.   No murmur heard. Pulmonary/Chest: Effort normal. No respiratory distress. She has wheezes. She has no rales. She exhibits tenderness.  Abdominal: Soft. She exhibits no distension. There is no tenderness. There is no guarding.  Musculoskeletal: She exhibits no edema.       Thoracic back: She exhibits tenderness.  Neurological: She is alert and oriented to person, place, and time.  Skin: Skin is warm and dry. No rash noted. She is not diaphoretic. No erythema.  Nursing note and vitals reviewed.    ED Treatments / Results  Labs (all labs ordered are listed, but only abnormal results are displayed) Labs Reviewed - No data to display  EKG  EKG Interpretation None       Radiology Dg Chest 2 View  Result Date: 05/16/2016 CLINICAL DATA:  Fever and afternoon chills for 2-3 days, soreness between shoulder blades and lower chest for 2 weeks, coughing, wheezing, shortness of breath EXAM: CHEST  2 VIEW COMPARISON:  09/04/2014 FINDINGS: Normal heart size, mediastinal contours, and pulmonary vascularity. Lungs clear. No pleural effusion or pneumothorax. Bones unremarkable. IMPRESSION: Normal exam. Electronically Signed   By: Lavonia Dana M.D.   On: 05/16/2016 15:50    Procedures Procedures (including critical care time)  Medications Ordered in ED Medications  albuterol (PROVENTIL HFA;VENTOLIN HFA) 108 (90 Base) MCG/ACT inhaler 2 puff (2 puffs Inhalation Given 05/16/16 1951)     Initial Impression / Assessment and Plan / ED Course  I have reviewed the triage vital signs and the nursing notes.  Pertinent labs & imaging results that were available during my care of the patient were reviewed by me and considered in my medical decision making (see chart for details).  Clinical Course    61yo female with history of bipolar, depression presents with concern for cough,  congestion, fatigue, diarrhea.  CXR shows no sign of pneumonia.  Husband concerned as she was difficult to arouse from sleep this morning, which is most likely secondary to sedating  effects of phenergan. Patient with normal oxygenation in ED, no respiratory distress. Mild wheezing on exam and will give albuterol. Back pain with cough likely secondary to muscular strain from coughing. Pt tolerating po in ED. Recommend continued supportive care. Patient discharged in stable condition with understanding of reasons to return.     Final Clinical Impressions(s) / ED Diagnoses   Final diagnoses:  Viral URI with cough, suspect influenza    New Prescriptions Discharge Medication List as of 05/16/2016  7:49 PM       Gareth Morgan, MD 05/17/16 1307

## 2016-05-21 ENCOUNTER — Telehealth: Payer: Self-pay | Admitting: Endocrinology

## 2016-05-21 ENCOUNTER — Telehealth: Payer: Self-pay

## 2016-05-21 NOTE — Telephone Encounter (Signed)
Her PCP  wants [place] to evaluate patient for thyroid again, please schedule 15 minute follow-up for thyroid, labs done this month

## 2016-05-21 NOTE — Telephone Encounter (Signed)
Patient seen in ED on 05/17/15 the day she made phone call to [facility].

## 2016-05-24 ENCOUNTER — Encounter: Payer: Self-pay | Admitting: Medical

## 2016-05-24 ENCOUNTER — Ambulatory Visit (INDEPENDENT_AMBULATORY_CARE_PROVIDER_SITE_OTHER): Payer: BLUE CROSS/BLUE SHIELD | Admitting: Medical

## 2016-05-24 VITALS — BP 140/88 | HR 63 | Temp 97.9°F | Resp 16 | Ht 65.0 in | Wt 187.4 lb

## 2016-05-24 DIAGNOSIS — H9313 Tinnitus, bilateral: Secondary | ICD-10-CM

## 2016-05-24 DIAGNOSIS — R0981 Nasal congestion: Secondary | ICD-10-CM | POA: Diagnosis not present

## 2016-05-24 DIAGNOSIS — H938X3 Other specified disorders of ear, bilateral: Secondary | ICD-10-CM

## 2016-05-24 MED ORDER — FLUTICASONE PROPIONATE 50 MCG/ACT NA SUSP: 2.0000 | Freq: Every day | NASAL | 1 refills | Status: AC

## 2016-05-24 MED ORDER — METHYLPREDNISOLONE ACETATE 40 MG/ML IJ SUSP
40.0000 mg | Freq: Once | INTRAMUSCULAR | Status: AC
  Administered 2016-05-24: 40 mg via INTRAMUSCULAR

## 2016-05-24 NOTE — Progress Notes (Signed)
Subjective:    Patient ID: Sarah Salazar, female    DOB: December 25, 1955, 61 y.o.   MRN: [id number]  HPI  Pt in stating she has clogged sensation/pressure sensation to her ears with low level tinnitus. Worse at night. No sinus pressure reported(these symptoms since ED evaluation)  Pt states she went to ED. Pt states before she went had a lot of sedation with phenergan. Pt had some mild wheezing on exam that day. Pt diagnosed with flu vs uri.  Pt states tamiflu made her nausea and she vomited due to the medication.   Pt states overall feels better now before the ED evaluation she gives clinical course as below/same as ED described.  3 wk cough congestion--initially lasted a few days before going to doctor. Diagnosed with sinus infection, given abx, improved for one day then last weekend began to get worse, subjective fever, chills.  Symptoms began on Thursday and have been persistent.  New cough, congestion, fever, general malaise. Called into doctors office yesterday and got rx for tamiflu and phenergan.  Vomited after taking tamiflu and stopped taking it. SOB with activity. Pain in shoulder/back with coughing, general weakness. No sick contacts. Did have flu shot. Had one episode of diarrhea yesterday.  Husband reports she was having noisy breathing/wheezing while she was sleeping and was difficult to arouse from sleep and he became concerned, and in particular wanted to check her oxygen saturation.  Reports his ex-wife had passed away in her sleep after going to bed feeling sick and that experience likely contributed to his concerns this AM.     Review of Systems  Constitutional: Negative for chills and fatigue.  HENT: Positive for congestion.        Ear pressure sensation and tinnitus.  Respiratory: Negative for cough, chest tightness and wheezing.   Cardiovascular: Negative for chest pain and palpitations.  Gastrointestinal: Negative for abdominal pain.  Musculoskeletal: Negative for back  pain and myalgias.  Neurological: Negative for dizziness, syncope, weakness and headaches.  Hematological: Negative for adenopathy. Does not bruise/bleed easily.  Psychiatric/Behavioral: Negative for behavioral problems, confusion and dysphoric mood. The patient is not nervous/anxious and is not hyperactive.     Past Medical History:  Diagnosis Date  . Anxiety   . Arthritis    both thumbs  . Bipolar disorder ([place])   . Bulging lumbar disc    "L3-5" (08/01/2013)  . Chest pain    Emergency room April 23, 2012, troponin is normal, chest CT scan showed no pulmonary embolus  . Complication of anesthesia    "I'm allergic to versed and fentanyl"  . Cough    Chronic cough  . Depression   . Ejection fraction    EF 55-60%, echo, December, 2013, lipomatous hypertrophy of the atrial septum  . Family history of anesthesia complication    "hard time waking daughter up post SVT ablation"  . GERD (gastroesophageal reflux disease)   . Hypertension   . Hypothyroidism    hypothyroidism  . IBS (irritable bowel syndrome)   . Personal history of colonic adenoma 06/28/2012  . Personal history of failed moderate sedation 06/20/2012  . Pneumonia    "couple times" (08/01/2013)  . Sleep apnea    does not use CPAP     Social History   Social History  . Marital status: Divorced    Spouse name: N/A  . Number of children: 1  . Years of education: N/A   Occupational History  .  [employer]   Social  History Main Topics  . Smoking status: Never Smoker  . Smokeless tobacco: Never Used  . Alcohol use Yes     Comment: 08/01/2013 "glass of wine 6 times/year at most"  . Drug use: No  . Sexual activity: Yes    Birth control/ protection: Post-menopausal   Other Topics Concern  . 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 BREAST  LUMPECTOMY Left    "1st breast OR"  . CHOLECYSTECTOMY    . COLONOSCOPY  2002  . CYSTOCELE REPAIR  01/13/2009  . DILATION AND CURETTAGE OF UTERUS  X 3  . ELBOW SURGERY Left 2005  . ESOPHAGOGASTRODUODENOSCOPY (EGD) WITH ESOPHAGEAL DILATION  2004; 2014  . KNEE ARTHROSCOPY Right X 2  . LAPAROSCOPY  1970's - 06/03/1990   "several; to evaluate dysfunctional menses & pelvic pain"  . MASTECTOMY, PARTIAL Left    "2nd breast OR", benign fibrocystic changes  . WISDOM TOOTH EXTRACTION     "all 4 at once"  . WRIST SURGERY  1979-~ 2010   X 4    Family History  Problem Relation Age of Onset  . Coronary artery disease Mother   . Stroke Mother     TIA  . Uterine cancer Mother     BREAST  . Thyroid disease Mother     goiter  . Lung cancer Father   . Heart disease Father   . COPD Father   . Coronary artery disease Father   . Colon cancer Paternal Uncle     COLON; STOMACH  . Lung cancer Maternal Grandfather   . Rheum arthritis Maternal Aunt   . Supraventricular tachycardia Daughter     S/P ablation  . Thyroid disease Cousin     Allergies  Allergen Reactions  . Amoxicillin Rash       . Fentanyl Rash    given with Versed  . Midazolam Rash    given with Fentanyl  . Oxycodone-Aspirin Rash  . Sulfonamide Derivatives Rash    rash  . Hydrocodone Hives and Nausea And Vomiting  . Nalbuphine Nausea And Vomiting  . Oxycodone-Acetaminophen Hives and Nausea And Vomiting  . Venlafaxine Other (See Comments)    Patient says makes her crazy  . Verapamil Palpitations    Caused "pounding " in chest    Current Outpatient Prescriptions on File Prior to Visit  Medication Sig Dispense Refill  . ALPRAZolam (XANAX) 1 MG tablet Take 1 tablet (1 mg total) by mouth 3 (three) times daily as needed. 60 tablet 0  . ARMOUR THYROID 90 MG tablet TAKE ONE-HALF (1/2) TABLET DAILY 45 tablet 1  . buPROPion (WELLBUTRIN) 100 MG tablet Take 1 tablet by mouth 2 (two) times daily.    . fluticasone (FLONASE) 50  MCG/ACT nasal spray Place 2 sprays into both nostrils daily. 16 g 1  . hyoscyamine (LEVSIN SL) 0.125 MG SL tablet Place 1 tablet (0.125 mg total) under the tongue every 4 (four) hours as needed. 90 tablet 3  . lamoTRIgine (LAMICTAL) 100 MG tablet Take 1 tablet by mouth every other day.    . metaxalone (SKELAXIN) 800 MG tablet as needed.     . metoprolol tartrate (LOPRESSOR) 25 MG tablet TAKE ONE-HALF (1/2) TABLET TWICE A DAY 180 tablet 0  .  pantoprazole (PROTONIX) 40 MG tablet TAKE 1 TABLET DAILY 90 tablet 1  . promethazine-dextromethorphan (PROMETHAZINE-DM) 6.25-15 MG/5ML syrup Take 2.5 mLs by mouth 4 (four) times daily as needed for cough. 118 mL 0  . traMADol (ULTRAM) 50 MG tablet Take 1 tablet (50 mg total) by mouth every 6 (six) hours as needed. 30 tablet 0  . levocetirizine (XYZAL) 5 MG tablet Take 1 tablet (5 mg total) by mouth every evening. (Patient not taking: Reported on 05/24/2016) 30 tablet 5  . promethazine (PHENERGAN) 12.5 MG tablet Take 1 tablet (12.5 mg total) by mouth every 8 (eight) hours as needed for nausea or vomiting. (Patient not taking: Reported on 05/24/2016) 20 tablet 0   No current facility-administered medications on file prior to visit.     BP (!) 144/88 (BP Location: Left Arm, Patient Position: Sitting, Cuff Size: Large)   Pulse 63   Temp 97.9 F (36.6 C) (Oral)   Resp 16   Ht 5\' 5"  (1.651 m)   Wt 187 lb 6 oz (85 kg)   SpO2 97%   BMI 31.18 kg/m       Objective:   Physical Exam  General  Mental Status - Alert. General Appearance - Well groomed. Not in acute distress.  Skin Rashes- No Rashes.  HEENT Head- Normal. Ear Auditory Canal - Left- Normal. Right - Normal.Tympanic Membrane- Left- Normal. Right- Normal. Eye Sclera/Conjunctiva- Left- Normal. Right- Normal. Nose & Sinuses Nasal Mucosa- Left-  Boggy and Congested. Right-  Boggy and  Congested.Faint left  maxillary and no  frontal sinus pressure. Mouth & Throat Lips: Upper Lip- Normal: no  dryness, cracking, pallor, cyanosis, or vesicular eruption. Lower Lip-Normal: no dryness, cracking, pallor, cyanosis or vesicular eruption. Buccal Mucosa- Bilateral- No Aphthous ulcers. Oropharynx- No Discharge or Erythema. Tonsils: Characteristics- Bilateral- No Erythema or Congestion. Size/Enlargement- Bilateral- No enlargement. Discharge- bilateral-None.  Neck Neck- Supple. No Masses.   Chest and Lung Exam Auscultation: Breath Sounds:-Clear even and unlabored.  Cardiovascular Auscultation:Rythm- Regular, rate and rhythm. Murmurs & Other Heart Sounds:Ausculatation of the heart reveal- No Murmurs.  Lymphatic Head & Neck General Head & Neck Lymphatics: Bilateral: Description- No Localized lymphadenopathy.       Assessment & Plan:  Flu like symptoms when seen by ED have largely resolved.   But your nasal congestion still persists with  eustachian tube dysfunction and tinnitus.Use flonase nasal steroid and depomedrol 40 mg im. If your symptoms persist then will refer you to ENT.  Currently appear not to need antibiotic. But if your faint left sinus pressure persists despite the above let me know.  Follow up 7 days or as needed   Hatley Henegar, Percell Miller, [employer]

## 2016-05-24 NOTE — Patient Instructions (Addendum)
Flu like symptoms when seen by ED have largely resolved.   But your nasal congestion still persists with  eustachian tube dysfunction and tinnitus. Use  flonase nasal steroid and depomedrol 40 mg im. If your symptoms persist then will refer you to ENT.  Currently appear not to need antibiotic. But if your faint left sinus pressure persists despite the above let me know.  Follow up 7 days or as needed

## 2016-05-24 NOTE — Progress Notes (Signed)
Pre visit review using our clinic review tool, if applicable. No additional management support is needed unless otherwise documented below in the visit note/SLS  

## 2016-05-26 NOTE — Telephone Encounter (Signed)
LM for pt to call to schedule.

## 2016-05-27 ENCOUNTER — Encounter: Payer: Self-pay | Admitting: Family Medicine

## 2016-05-27 ENCOUNTER — Encounter: Payer: Self-pay | Admitting: Medical

## 2016-05-27 ENCOUNTER — Ambulatory Visit: Payer: BLUE CROSS/BLUE SHIELD | Admitting: Medical

## 2016-05-27 ENCOUNTER — Ambulatory Visit (INDEPENDENT_AMBULATORY_CARE_PROVIDER_SITE_OTHER): Payer: BLUE CROSS/BLUE SHIELD | Admitting: Medical

## 2016-05-27 ENCOUNTER — Telehealth: Payer: Self-pay | Admitting: Medical

## 2016-05-27 ENCOUNTER — Other Ambulatory Visit: Payer: Self-pay

## 2016-05-27 VITALS — BP 150/80 | HR 69 | Temp 98.3°F | Ht 65.0 in | Wt 185.0 lb

## 2016-05-27 DIAGNOSIS — R42 Dizziness and giddiness: Secondary | ICD-10-CM

## 2016-05-27 DIAGNOSIS — R059 Cough, unspecified: Secondary | ICD-10-CM

## 2016-05-27 DIAGNOSIS — R05 Cough: Secondary | ICD-10-CM | POA: Diagnosis not present

## 2016-05-27 DIAGNOSIS — R002 Palpitations: Secondary | ICD-10-CM

## 2016-05-27 DIAGNOSIS — R112 Nausea with vomiting, unspecified: Secondary | ICD-10-CM

## 2016-05-27 DIAGNOSIS — I10 Essential (primary) hypertension: Secondary | ICD-10-CM

## 2016-05-27 MED ORDER — METOPROLOL TARTRATE 25 MG PO TABS: ORAL_TABLET | ORAL | 2 refills | Status: DC

## 2016-05-27 MED ORDER — MECLIZINE HCL 12.5 MG PO TABS: 12.5000 mg | ORAL_TABLET | Freq: Three times a day (TID) | ORAL | 0 refills | Status: DC | PRN

## 2016-05-27 MED FILL — METOPROLOL TARTRATE 25 MG T: 25 | 30 days supply | Qty: 60 | Fill #0

## 2016-05-27 MED FILL — MECLIZINE 12.5 MG CAPLET: 12.5 | 10 days supply | Qty: 30 | Fill #0

## 2016-05-27 NOTE — Progress Notes (Signed)
Subjective:    Patient ID: Sarah Salazar, female    DOB: April 18, 1956, 61 y.o.   MRN: [id number]  HPI  Pt states yesterday she woke up felt better in regards to her prior nasal congestion and ear pressure. I gave her depomedrol the other day.  But she does report yesterday had nausea and vomited 2 times and felt mild dizzy with mild ha. Today she has some mild dizziness again. Pt did take phenergan yesterday. Pt not using any nsaids. Pt states today when she checked her bp was  152/91 and pulse 82.  BP check in office by myself 150/80 and 165/90. Pt bp reading with her machine was 156/106. Pulse was 71.Most of time pulse in 80's per her machine.  Pt has history of palpitation in the past. Hx of sensation tachycardia. In past pt given metoprolol 25 mg(she takes one tablet a day). Pt had echo done in past and nuclear stress test. And holter monitor. She states all were ok.  Pt states this morning she had palpitation sensation.  Pt pulse on bp checks often in 80's.    Review of Systems  Constitutional: Negative for chills, fatigue and fever.  Respiratory: Positive for cough. Negative for chest tightness, shortness of breath and wheezing.   Cardiovascular: Negative for chest pain and palpitations.  Gastrointestinal: Positive for nausea and vomiting. Negative for abdominal pain.       Vomited one time.  Skin: Negative for rash.  Neurological: Positive for dizziness. Negative for seizures, syncope, facial asymmetry, speech difficulty and weakness.       See hpi but now now.  Hematological: Negative for adenopathy. Does not bruise/bleed easily.  Psychiatric/Behavioral: Negative for behavioral problems and confusion.    Past Medical History:  Diagnosis Date  . Anxiety   . Arthritis    both thumbs  . Bipolar disorder ([place])   . Bulging lumbar disc    "L3-5" (08/01/2013)  . Chest pain    Emergency room April 23, 2012, troponin is normal, chest CT scan showed no pulmonary embolus  .  Complication of anesthesia    "I'm allergic to versed and fentanyl"  . Cough    Chronic cough  . Depression   . Ejection fraction    EF 55-60%, echo, December, 2013, lipomatous hypertrophy of the atrial septum  . Family history of anesthesia complication    "hard time waking daughter up post SVT ablation"  . GERD (gastroesophageal reflux disease)   . Hypertension   . Hypothyroidism    hypothyroidism  . IBS (irritable bowel syndrome)   . Personal history of colonic adenoma 06/28/2012  . Personal history of failed moderate sedation 06/20/2012  . Pneumonia    "couple times" (08/01/2013)  . Sleep apnea    does not use CPAP     Social History   Social History  . Marital status: Divorced    Spouse name: N/A  . Number of children: 1  . Years of education: N/A   Occupational History  .  [employer]   Social History Main Topics  . Smoking status: Never Smoker  . Smokeless tobacco: Never Used  . Alcohol use Yes     Comment: 08/01/2013 "glass of wine 6 times/year at most"  . Drug use: No  . Sexual activity: Yes    Birth control/ protection: Post-menopausal   Other Topics Concern  . 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 BREAST LUMPECTOMY Left    "1st breast OR"  . CHOLECYSTECTOMY    . COLONOSCOPY  2002  . CYSTOCELE REPAIR  01/13/2009  . DILATION AND CURETTAGE OF UTERUS  X 3  . ELBOW SURGERY Left 2005  . ESOPHAGOGASTRODUODENOSCOPY (EGD) WITH ESOPHAGEAL DILATION  2004; 2014  . KNEE ARTHROSCOPY Right X 2  . LAPAROSCOPY  1970's - 06/03/1990   "several; to evaluate dysfunctional menses & pelvic pain"  . MASTECTOMY, PARTIAL Left    "2nd breast OR", benign fibrocystic changes  . WISDOM TOOTH EXTRACTION     "all 4 at once"  . WRIST SURGERY  1979-~ 2010   X 4    Family History  Problem Relation Age of Onset    . Coronary artery disease Mother   . Stroke Mother     TIA  . Uterine cancer Mother     BREAST  . Thyroid disease Mother     goiter  . Lung cancer Father   . Heart disease Father   . COPD Father   . Coronary artery disease Father   . Colon cancer Paternal Uncle     COLON; STOMACH  . Lung cancer Maternal Grandfather   . Rheum arthritis Maternal Aunt   . Supraventricular tachycardia Daughter     S/P ablation  . Thyroid disease Cousin     Allergies  Allergen Reactions  . Amoxicillin Rash       . Fentanyl Rash    given with Versed  . Midazolam Rash    given with Fentanyl  . Oxycodone-Aspirin Rash  . Sulfonamide Derivatives Rash    rash  . Hydrocodone Hives and Nausea And Vomiting  . Nalbuphine Nausea And Vomiting  . Oxycodone-Acetaminophen Hives and Nausea And Vomiting  . Venlafaxine Other (See Comments)    Patient says makes her crazy  . Verapamil Palpitations    Caused "pounding " in chest    Current Outpatient Prescriptions on File Prior to Visit  Medication Sig Dispense Refill  . ALPRAZolam (XANAX) 1 MG tablet Take 1 tablet (1 mg total) by mouth 3 (three) times daily as needed. 60 tablet 0  . ARMOUR THYROID 90 MG tablet TAKE ONE-HALF (1/2) TABLET DAILY 45 tablet 1  . buPROPion (WELLBUTRIN) 100 MG tablet Take 1 tablet by mouth 2 (two) times daily.    . fluticasone (FLONASE) 50 MCG/ACT nasal spray Place 2 sprays into both nostrils daily. 16 g 1  . fluticasone (FLONASE) 50 MCG/ACT nasal spray Place 2 sprays into both nostrils daily. 16 g 1  . hyoscyamine (LEVSIN SL) 0.125 MG SL tablet Place 1 tablet (0.125 mg total) under the tongue every 4 (four) hours as needed. 90 tablet 3  . lamoTRIgine (LAMICTAL) 100 MG tablet Take 1 tablet by mouth every other day.    . levocetirizine (XYZAL) 5 MG tablet Take 1 tablet (5 mg total) by mouth every evening. 30 tablet 5  . metaxalone (SKELAXIN) 800 MG tablet as needed.     . metoprolol tartrate (LOPRESSOR) 25 MG tablet TAKE  ONE-HALF (1/2) TABLET TWICE A DAY 180 tablet 0  . pantoprazole (PROTONIX) 40 MG tablet TAKE 1 TABLET DAILY 90 tablet 1  . promethazine (PHENERGAN) 12.5 MG tablet Take 1 tablet (12.5 mg total) by mouth every 8 (eight) hours as needed for nausea or vomiting.  20 tablet 0  . promethazine-dextromethorphan (PROMETHAZINE-DM) 6.25-15 MG/5ML syrup Take 2.5 mLs by mouth 4 (four) times daily as needed for cough. 118 mL 0  . traMADol (ULTRAM) 50 MG tablet Take 1 tablet (50 mg total) by mouth every 6 (six) hours as needed. 30 tablet 0   No current facility-administered medications on file prior to visit.     BP 138/70   Pulse 69   Temp 98.3 F (36.8 C) (Oral)   Ht 5\' 5"  (1.651 m)   Wt 185 lb (83.9 kg)   SpO2 99%   BMI 30.79 kg/m       Objective:   Physical Exam  General  Mental Status - Alert. General Appearance - Well groomed. Not in acute distress.  Skin Rashes- No Rashes.  HEENT Head- Normal. Ear Auditory Canal - Left- Normal. Right - Normal.Tympanic Membrane- Left- Normal. Right- Normal. Eye Sclera/Conjunctiva- Left- Normal. Right- Normal. Nose & Sinuses Nasal Mucosa- Left-  Boggy and Congested. Right-  Boggy and  Congested.Bilateral no maxillary and no frontal sinus pressure. Mouth & Throat Lips: Upper Lip- Normal: no dryness, cracking, pallor, cyanosis, or vesicular eruption. Lower Lip-Normal: no dryness, cracking, pallor, cyanosis or vesicular eruption. Buccal Mucosa- Bilateral- No Aphthous ulcers. Oropharynx- No Discharge or Erythema. Tonsils: Characteristics- Bilateral- No Erythema or Congestion. Size/Enlargement- Bilateral- No enlargement. Discharge- bilateral-None.  Neck Neck- Supple. No Masses.   Chest and Lung Exam Auscultation: Breath Sounds:-Clear even and unlabored.  Cardiovascular Auscultation:Rythm- Regular, rate and rhythm. Murmurs & Other Heart Sounds:Ausculatation of the heart reveal- No Murmurs.  Lymphatic Head & Neck General Head & Neck  Lymphatics: Bilateral: Description- No Localized lymphadenopathy.    Neurologic Cranial Nerve exam:- CN III-XII intact(No nystagmus), symmetric smile. Strength:- 5/5 equal and symmetric strength both upper and lower extremities.       Assessment & Plan:  Your bp levels are high today on my check and with your machine as well. Will advise taking metoprolol 25 mg twice daily. You might need just 1/2 tab at night as we discussed.(will see if pulse decreases less than 60)  You may also have dizziness related to viral labyrinthitis. I offered meclizine but you declined. You can use your phenergan for nausea(may have some effect on dizziness).  For cough you have benzonatate available. You could also use corcidin hbp for cough and congestion.  Follow up in 2 weeks or as needed(want to check both bp and pulse in office)  Discussed signs and symptoms that would require ED evaluation.

## 2016-05-27 NOTE — Telephone Encounter (Signed)
Recent Medication to pharmacy.

## 2016-05-27 NOTE — Progress Notes (Signed)
Pre visit review using our clinic tool,if applicable. No additional management support is needed unless otherwise documented below in the visit note.  

## 2016-05-27 NOTE — Telephone Encounter (Signed)
error:[id number] ° °

## 2016-05-27 NOTE — Telephone Encounter (Signed)
Will you check and make sure her metropolol and meclizine rx went through

## 2016-05-27 NOTE — Patient Instructions (Addendum)
Your bp levels are high today on my check and with your machine as well. Will advise taking metoprolol 25 mg twice daily. You might need just 1/2 tab at night as we discussed.(will see if pulse decreases less than 60)  You may also have dizziness related to viral labyrinthitis. I offered meclizine but you declined. You can use your phenergan for nausea(may have some effect on dizziness).  For cough you have benzonatate available. You could also use corcidin hbp for cough and congestion.  Follow up in 2 weeks or as needed(want to check both bp and pulse in office)   Later we discussed meclizine and did write for dizziness but not to use with phenergan.

## 2016-06-07 NOTE — Telephone Encounter (Signed)
LM for pt to call to schedule.

## 2016-06-10 ENCOUNTER — Ambulatory Visit: Payer: BLUE CROSS/BLUE SHIELD | Admitting: Medical

## 2016-06-28 ENCOUNTER — Encounter: Payer: Self-pay | Admitting: Family Medicine

## 2016-06-28 ENCOUNTER — Ambulatory Visit (INDEPENDENT_AMBULATORY_CARE_PROVIDER_SITE_OTHER): Payer: BLUE CROSS/BLUE SHIELD | Admitting: Family Medicine

## 2016-06-28 VITALS — BP 118/72 | HR 61 | Temp 98.1°F | Resp 16 | Ht 65.0 in | Wt 184.2 lb

## 2016-06-28 DIAGNOSIS — K30 Functional dyspepsia: Secondary | ICD-10-CM | POA: Diagnosis not present

## 2016-06-28 DIAGNOSIS — R110 Nausea: Secondary | ICD-10-CM | POA: Diagnosis not present

## 2016-06-28 MED ORDER — BUSPIRONE HCL 10 MG PO TABS: 10.0000 mg | ORAL_TABLET | Freq: Two times a day (BID) | ORAL | 2 refills | Status: DC

## 2016-06-28 MED FILL — busPIRone HCL 10 MG TABS: 10 | 30 days supply | Qty: 60 | Fill #0

## 2016-06-28 NOTE — Patient Instructions (Signed)
Nausea, Adult Introduction Feeling sick to your stomach (nausea) means that your stomach is upset or you feel like you have to throw up (vomit). Feeling sick to your stomach is usually not serious, but it may be an early sign of a more serious medical problem. As you feel sicker to your stomach, it can lead to throwing up (vomiting). If you throw up, or if you are not able to drink enough fluids, there is a risk of dehydration. Dehydration can make you feel tired and thirsty, have a dry mouth, and pee (urinate) less often. Older adults and people who have other diseases or a weak defense (immune) system have a higher risk of dehydration. The main goal of treating this condition is to:  Limit how often you feel sick to your stomach.  Prevent throwing up and dehydration. Follow these instructions at home: Follow instructions from your doctor about how to care for yourself at home. Eating and drinking Follow these recommendations as told by your doctor:  Take an oral rehydration solution (ORS). This is a drink that is sold at pharmacies and stores.  Drink clear fluids in small amounts as you are able, such as:  Water.  Ice chips.  Fruit juice that has water added (diluted fruit juice).  Low-calorie sports drinks.  Eat bland, easy to digest foods in small amounts as you are able, such as:  Bananas.  Applesauce.  Rice.  Lean meats.  Toast.  Crackers.  Avoid drinking fluids that contain a lot of sugar or caffeine.  Avoid alcohol.  Avoid spicy or fatty foods. General instructions  Drink enough fluid to keep your pee (urine) clear or pale yellow.  Wash your hands often. If you cannot use soap and water, use hand sanitizer.  Make sure that all people in your household wash their hands well and often.  Rest at home while you get better.  Take over-the-counter and prescription medicines only as told by your doctor.  Breathe slowly and deeply when you feel sick to your  stomach.  Watch your condition for any changes.  Keep all follow-up visits as told by your doctor. This is important. Contact a doctor if:  You have a headache.  You have new symptoms.  You feel sicker to your stomach.  You have a fever.  You feel light-headed or dizzy.  You throw up.  You are not able to keep fluids down. Get help right away if:  You have pain in your chest, neck, arm, or jaw.  You feel very weak or you pass out (faint).  You have throw up that is bright red or looks like coffee grounds.  You have bloody or black poop (stools), or poop that looks like tar.  You have a very bad headache, a stiff neck, or both.  You have very bad pain, cramping, or bloating in your belly.  You have a rash.  You have trouble breathing or you are breathing very quickly.  Your heart is beating very quickly.  Your skin feels cold and clammy.  You feel confused.  You have pain while peeing.  You have signs of dehydration, such as:  Dark pee, or very little or no pee.  Cracked lips.  Dry mouth.  Sunken eyes.  Sleepiness.  Weakness. These symptoms may be an emergency. Do not wait to see if the symptoms will go away. Get medical help right away. Call your local emergency services (911 in the [place] Do not drive yourself to the   hospital.  This information is not intended to replace advice given to you by your health care provider. Make sure you discuss any questions you have with your health care provider. Document Released: 04/08/2011 Document Revised: 09/25/2015 Document Reviewed: 12/24/2014  2017 Elsevier  

## 2016-06-28 NOTE — Progress Notes (Signed)
Patient ID: Sarah Salazar, female    DOB: 1955-05-04  Age: 61 y.o. MRN: [id number]    Subjective:  Subjective  HPI Sarah Salazar presents for NVD since Friday.  She has had nausea ongoing and has had GI work up in past  Pt has come in several times in the past and given PPI and GI cocktail and it helps --- she had a gastric emptying study last year -- pt knew it was normal but never got the message that Dr Lorayne Bender wanted her to start medication.  No more vomiting or diarrhea-- 2 loose bm over last 2 days.      Review of Systems  Constitutional: Negative for appetite change, diaphoresis, fatigue and unexpected weight change.  Eyes: Negative for pain, redness and visual disturbance.  Respiratory: Negative for cough, chest tightness, shortness of breath and wheezing.   Cardiovascular: Negative for chest pain, palpitations and leg swelling.  Gastrointestinal: Positive for abdominal distention, diarrhea, nausea and vomiting. Negative for constipation.  Endocrine: Negative for cold intolerance, heat intolerance, polydipsia, polyphagia and polyuria.  Genitourinary: Negative for difficulty urinating, dysuria and frequency.  Neurological: Negative for dizziness, light-headedness, numbness and headaches.    History Past Medical History:  Diagnosis Date  . Anxiety   . Arthritis    both thumbs  . Bipolar disorder ([place])   . Bulging lumbar disc    "L3-5" (08/01/2013)  . Chest pain    Emergency room April 23, 2012, troponin is normal, chest CT scan showed no pulmonary embolus  . Complication of anesthesia    "I'm allergic to versed and fentanyl"  . Cough    Chronic cough  . Depression   . Ejection fraction    EF 55-60%, echo, December, 2013, lipomatous hypertrophy of the atrial septum  . Family history of anesthesia complication    "hard time waking daughter up post SVT ablation"  . GERD (gastroesophageal reflux disease)   . Hypertension   . Hypothyroidism    hypothyroidism  . IBS  (irritable bowel syndrome)   . Personal history of colonic adenoma 06/28/2012  . Personal history of failed moderate sedation 06/20/2012  . Pneumonia    "couple times" (08/01/2013)  . Sleep apnea    does not use CPAP    She has a past surgical history that includes Cholecystectomy; Dilation and curettage of uterus (X 3); Knee arthroscopy (Right, X 2); Wrist surgery (1979-~ 2010); laparoscopy [id number] - 06/03/1990); Cystocele repair (01/13/2009); Esophagogastroduodenoscopy (egd) with esophageal dilation (2004; 2014); Colonoscopy (2002); Elbow surgery (Left, 2005); Breast lumpectomy (Left); Mastectomy, partial (Left); and Wisdom tooth extraction.   Her family history includes COPD in her father; Colon cancer in her paternal uncle; Coronary artery disease in her father and mother; Heart disease in her father; Lung cancer in her father and maternal grandfather; Rheum arthritis in her maternal aunt; Stroke in her mother; Supraventricular tachycardia in her daughter; Thyroid disease in her cousin and mother; Uterine cancer in her mother.She reports that she has never smoked. She has never used smokeless tobacco. She reports that she drinks alcohol. She reports that she does not use drugs.  Current Outpatient Prescriptions on File Prior to Visit  Medication Sig Dispense Refill  . ALPRAZolam (XANAX) 1 MG tablet Take 1 tablet (1 mg total) by mouth 3 (three) times daily as needed. 60 tablet 0  . ARMOUR THYROID 90 MG tablet TAKE ONE-HALF (1/2) TABLET DAILY 45 tablet 1  . buPROPion (WELLBUTRIN) 100 MG tablet Take 1 tablet by mouth  2 (two) times daily.    . fluticasone (FLONASE) 50 MCG/ACT nasal spray Place 2 sprays into both nostrils daily. 16 g 1  . fluticasone (FLONASE) 50 MCG/ACT nasal spray Place 2 sprays into both nostrils daily. 16 g 1  . hyoscyamine (LEVSIN SL) 0.125 MG SL tablet Place 1 tablet (0.125 mg total) under the tongue every 4 (four) hours as needed. 90 tablet 3  . lamoTRIgine (LAMICTAL) 100 MG  tablet Take 1 tablet by mouth every other day.    . levocetirizine (XYZAL) 5 MG tablet Take 1 tablet (5 mg total) by mouth every evening. 30 tablet 5  . meclizine (ANTIVERT) 12.5 MG tablet Take 1 tablet (12.5 mg total) by mouth 3 (three) times daily as needed for dizziness. 30 tablet 0  . metaxalone (SKELAXIN) 800 MG tablet as needed.     . metoprolol tartrate (LOPRESSOR) 25 MG tablet 1 tab po twice daily 60 tablet 2  . pantoprazole (PROTONIX) 40 MG tablet TAKE 1 TABLET DAILY 90 tablet 1  . promethazine (PHENERGAN) 12.5 MG tablet Take 1 tablet (12.5 mg total) by mouth every 8 (eight) hours as needed for nausea or vomiting. 20 tablet 0  . promethazine-dextromethorphan (PROMETHAZINE-DM) 6.25-15 MG/5ML syrup Take 2.5 mLs by mouth 4 (four) times daily as needed for cough. 118 mL 0  . traMADol (ULTRAM) 50 MG tablet Take 1 tablet (50 mg total) by mouth every 6 (six) hours as needed. 30 tablet 0   No current facility-administered medications on file prior to visit.      Objective:  Objective  Physical Exam  Constitutional: She is oriented to person, place, and time. She appears well-developed and well-nourished.  HENT:  Head: Normocephalic and atraumatic.  Eyes: Conjunctivae and EOM are normal.  Neck: Normal range of motion. Neck supple. No JVD present. Carotid bruit is not present. No thyromegaly present.  Cardiovascular: Normal rate, regular rhythm and normal heart sounds.   No murmur heard. Pulmonary/Chest: Effort normal and breath sounds normal. No respiratory distress. She has no wheezes. She has no rales. She exhibits no tenderness.  Abdominal: Soft. She exhibits distension. There is tenderness.  Musculoskeletal: She exhibits no edema.  Neurological: She is alert and oriented to person, place, and time.  Psychiatric: She has a normal mood and affect.  Nursing note and vitals reviewed.  BP 118/72 (BP Location: Left Arm, Patient Position: Sitting, Cuff Size: Normal)   Pulse 61   Temp  98.1 F (36.7 C) (Oral)   Resp 16   Ht 5\' 5"  (1.651 m)   Wt 184 lb 3.2 oz (83.6 kg)   SpO2 98%   BMI 30.65 kg/m  Wt Readings from Last 3 Encounters:  06/28/16 184 lb 3.2 oz (83.6 kg)  05/27/16 185 lb (83.9 kg)  05/24/16 187 lb 6 oz (85 kg)     Lab Results  Component Value Date   WBC 8.5 04/11/2015   HGB 14.3 04/11/2015   HCT 43.7 04/11/2015   PLT 228.0 04/11/2015   GLUCOSE 95 04/11/2015   CHOL 183 03/18/2015   TRIG 133.0 03/18/2015   HDL 38.20 (L) 03/18/2015   LDLDIRECT 126.5 10/12/2006   LDLCALC 118 (H) 03/18/2015   ALT 26 04/11/2015   AST 24 04/11/2015   NA 140 04/11/2015   K 4.2 04/11/2015   CL 105 04/11/2015   CREATININE 1.03 04/11/2015   BUN 8 04/11/2015   CO2 31 04/11/2015   TSH 2.50 05/07/2016   INR 1.05 08/01/2013   HGBA1C 5.6 08/01/2013  Dg Chest 2 View  Result Date: 05/16/2016 CLINICAL DATA:  Fever and afternoon chills for 2-3 days, soreness between shoulder blades and lower chest for 2 weeks, coughing, wheezing, shortness of breath EXAM: CHEST  2 VIEW COMPARISON:  09/04/2014 FINDINGS: Normal heart size, mediastinal contours, and pulmonary vascularity. Lungs clear. No pleural effusion or pneumothorax. Bones unremarkable. IMPRESSION: Normal exam. Electronically Signed   By: Lavonia Dana M.D.   On: 05/16/2016 15:50     Assessment & Plan:  Plan  I am having Ms. Shearman start on busPIRone. I am also having her maintain her traMADol, metaxalone, hyoscyamine, ALPRAZolam, ARMOUR THYROID, pantoprazole, buPROPion, lamoTRIgine, promethazine, fluticasone, levocetirizine, promethazine-dextromethorphan, fluticasone, meclizine, and metoprolol tartrate.  Meds ordered this encounter  Medications  . busPIRone (BUSPAR) 10 MG tablet    Sig: Take 1 tablet (10 mg total) by mouth 2 (two) times daily.    Dispense:  60 tablet    Refill:  2    Problem List Items Addressed This Visit      Unprioritized   Functional dyspepsia    Buspirone per GI Pt to use for 2 months---  and f/u with GI if no relief        Other Visit Diagnoses    Nausea    -  Primary   Relevant Medications   busPIRone (BUSPAR) 10 MG tablet      Follow-up: No Follow-up on file.  Ann Held, DO

## 2016-06-28 NOTE — Assessment & Plan Note (Signed)
Buspirone per GI Pt to use for 2 months--- and f/u with GI if no relief

## 2016-06-28 NOTE — Progress Notes (Signed)
Pre visit review using our clinic review tool, if applicable. No additional management support is needed unless otherwise documented below in the visit note. 

## 2016-06-30 ENCOUNTER — Telehealth: Payer: Self-pay | Admitting: Internal Medicine

## 2016-06-30 NOTE — Telephone Encounter (Signed)
Patient reports abdominal pain and nausea.  She reports that she can't take the phenergan because it makes her sleepy.  She is advised that she can increase her pantoprazole to BID until she sees Dr. Carlean Purl on Friday and remain on a bland diet.

## 2016-07-02 ENCOUNTER — Ambulatory Visit (INDEPENDENT_AMBULATORY_CARE_PROVIDER_SITE_OTHER): Payer: BLUE CROSS/BLUE SHIELD | Admitting: Internal Medicine

## 2016-07-02 ENCOUNTER — Encounter: Payer: Self-pay | Admitting: Internal Medicine

## 2016-07-02 VITALS — BP 138/80 | HR 64 | Ht 65.0 in | Wt 182.4 lb

## 2016-07-02 DIAGNOSIS — K30 Functional dyspepsia: Secondary | ICD-10-CM

## 2016-07-02 DIAGNOSIS — K580 Irritable bowel syndrome with diarrhea: Secondary | ICD-10-CM | POA: Diagnosis not present

## 2016-07-02 DIAGNOSIS — F439 Reaction to severe stress, unspecified: Secondary | ICD-10-CM

## 2016-07-02 DIAGNOSIS — F411 Generalized anxiety disorder: Secondary | ICD-10-CM | POA: Diagnosis not present

## 2016-07-02 DIAGNOSIS — R112 Nausea with vomiting, unspecified: Secondary | ICD-10-CM | POA: Diagnosis not present

## 2016-07-02 MED ORDER — ONDANSETRON HCL 4 MG PO TABS: 4.0000 mg | ORAL_TABLET | Freq: Three times a day (TID) | ORAL | 2 refills | Status: DC | PRN

## 2016-07-02 NOTE — Patient Instructions (Addendum)
   We have sent the following medications to your pharmacy for you to pick up at your convenience: Generic zofran  Today we are giving you a work note to be excused 07/01/16-07/04/16 and you may return 07/05/16.  Please follow up in 2 months.  I appreciate the opportunity to care for you. Silvano Rusk, MD, [facility]

## 2016-07-02 NOTE — Progress Notes (Signed)
Sarah Salazar 61 y.o. 1955-12-10 [id number]  Assessment & Plan:   Encounter Diagnoses  Name Primary?  . Functional dyspepsia Yes  . Irritable bowel syndrome with diarrhea   . Nausea and vomiting, intractability of vomiting not specified, unspecified vomiting type   . Situational stress   . Anxiety state     Factors affecting her I think. I'm confident she has functional dyspepsia and IBS. However there is a lot of situational stress in that she's had difficulty holding a job, earning enough money and may have to sell her trailer. She gets tearful about this. She has not been able to continue with her psychiatrist and her therapist as her psychiatrist retired and when her shirts was united they wouldn't take that for some reason. She cannot schedule times to meet with her therapist without missing work and she is concerned about that. I think the stress that she's under is certainly playing a role in a aggravating things. I had wondered if she might not have some sort of inner ear disturbance but probably not.  I am going to try ondansetron 4 mg before meals see if that helps functional dyspepsia nausea vomiting and IBS. She has recently started buspirone which may help the functional dyspepsia. I will see her back in 2 months. I recommended she definitely pursue following up a psychiatry and her therapist as the chronic nausea she is having may very well be related to anxiety at least in part if not mostly. She could've had some gastroenteritis recently, plus or minus irritable bowel flares.  If she does get symptoms of vertigo or has persistent problems with nausea and vomiting she might warrant seeing ENT again she had previously seen Dr. Radene Journey for hoarseness.  I gave her a work note and asked her to return on Monday. That's 3 days from now. I appreciate the opportunity to care for this patient. CC: Ann Held, DO     Subjective:   Chief Complaint: Nausea  vomiting abdominal pain and diarrhea  HPI The patient is a middle-aged white woman known to me with functional dyspepsia and IBS. When last seen she had a normal gastric emptying study and we had reached out her to recommend buspirone treatment for functional dyspepsia but somehow she never got that message. She resurfaced with Dr. Cheri Rous recently, and she saw that information and put her on buspirone recently. The patient has been sick in January and February with a lot of episodes of nausea vomiting and diarrhea, she was told she probably had the flu in January she seemed to get better and then got sick again last week. She can't seem to get completely well she's having a hard time making it to work. She's been in and out of several jobs, and now she is in some sort of contract job, doing medical billing but she's having difficulty coming to work consistently because she doesn't feel well. There is chronic daily nausea intensified with bleeding associated with some vomiting. She is trying to use small dose Phenergan to help. It does somewhat. She feels sore all over upper abdomen. There is intermittent abdominal cramps and loose stools, her stools are generally soft at best and never truly formed and no constipation. She's a bit of a poor historian when questioned, but it sounds like she does have days where she is not in extreme GI distress though nausea is present most days. She says that sometimes it seemed like she'll feel better  lying down and then when she gets up the nausea returns but there is no vertigo or true loss of balance or anything like that. She may have some mild tinnitus at times.  She's quite upset over the possibility of having the sell her trailer that she owns, she's having trouble making and meet she is living with a boyfriend could she cannot afford to have electricity on at her trailer, she can just barely passed a a lot rent for the trailer. 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 He is tearful and asked me to care for note for missing yesterday and today at work.   Review of Systems As above  Objective:   Physical Exam BP 138/80 (BP Location: Left Arm, Patient Position: Sitting, Cuff Size: Normal)   Pulse 64   Ht 5\' 5"  (1.651 m)   Wt 182 lb 6 oz (82.7 kg)   BMI 30.35 kg/m  NAD Eyes anicteric Lungs cta Cor s1s2 abd soft and benign but diffusely tender Neuro - no nystagmus, Romberg test - falls backward but not to side  I have reviewed PCP notes,

## 2016-07-06 ENCOUNTER — Telehealth: Payer: Self-pay | Admitting: Internal Medicine

## 2016-07-06 NOTE — Telephone Encounter (Signed)
Patient reports that today she had a semiformed stooll and had a small amount of bright red bleeding.  She is asking if this is cause for concern.  She is advised most likely this is from a hemorrhoid and that she should not be alarmed.  We reviewed that if she had an increase in bleeding or was passing blood independent of stool then she should proceed to the ED.  She will call back for any additional questions or concerns.

## 2016-07-06 NOTE — Telephone Encounter (Signed)
Left message for patient to call back  

## 2016-07-16 ENCOUNTER — Other Ambulatory Visit: Payer: Self-pay | Admitting: Family Medicine

## 2016-07-19 ENCOUNTER — Telehealth: Payer: Self-pay | Admitting: Internal Medicine

## 2016-07-19 NOTE — Telephone Encounter (Signed)
Patient reports that she is having "bouts of nausea that can last hours to 3 days".  She doesn't seem to have any improvement with Buspar.  Is there an alternative medication or additional measures to try?

## 2016-07-20 ENCOUNTER — Ambulatory Visit: Payer: BLUE CROSS/BLUE SHIELD | Admitting: Internal Medicine

## 2016-07-20 MED ORDER — PROMETHAZINE HCL 12.5 MG PO TABS: 12.5000 mg | ORAL_TABLET | Freq: Three times a day (TID) | ORAL | 0 refills | Status: DC | PRN

## 2016-07-20 NOTE — Telephone Encounter (Signed)
Patient notified of the results and recommendations. Rx sent for phenergan refill

## 2016-07-20 NOTE — Telephone Encounter (Signed)
It's quite possible that the nausea is not GI in origin, when I saw her I thought it could be from anxiety, and I recommended she pursue psychiatric care, and also I wondered if she could be having problems with inner ear, because she had been vomiting as well. If she is still vomiting along with the nausea she should make an appointment to see Dr. Lucia Gaskins of ENT, she saw him for hoarseness before.  I have prescribed ondansetron, I don't see that mentioned in the note here is that helping at all. If it's not, we could refill the promethazine that's on her list though that could make her sleepy and she female may be able to take it at bedtime.  At this point I do not recommend she refill and continue buspirone she should discontinue it

## 2016-07-23 ENCOUNTER — Encounter: Payer: Self-pay | Admitting: Family Medicine

## 2016-07-23 ENCOUNTER — Ambulatory Visit (INDEPENDENT_AMBULATORY_CARE_PROVIDER_SITE_OTHER): Payer: BLUE CROSS/BLUE SHIELD | Admitting: Family Medicine

## 2016-07-23 VITALS — BP 138/82 | HR 51 | Temp 98.2°F | Resp 16 | Ht 65.0 in | Wt 186.6 lb

## 2016-07-23 DIAGNOSIS — R1013 Epigastric pain: Secondary | ICD-10-CM | POA: Diagnosis not present

## 2016-07-23 DIAGNOSIS — R197 Diarrhea, unspecified: Secondary | ICD-10-CM | POA: Diagnosis not present

## 2016-07-23 MED ORDER — CHOLESTYRAMINE 4 G PO PACK: 4.0000 g | PACK | Freq: Three times a day (TID) | ORAL | 12 refills | Status: DC

## 2016-07-23 MED FILL — CHOLESTYRAMINE PACKET: 4 | 20 days supply | Qty: 60 | Fill #0

## 2016-07-23 NOTE — Progress Notes (Signed)
Pre visit review using our clinic review tool, if applicable. No additional management support is needed unless otherwise documented below in the visit note. 

## 2016-07-23 NOTE — Patient Instructions (Addendum)
Diarrhea, Adult Diarrhea is when you have loose and water poop (stool) often. Diarrhea can make you feel weak and cause you to get dehydrated. Dehydration can make you tired and thirsty, make you have a dry mouth, and make it so you pee (urinate) less often. Diarrhea often lasts 2-3 days. However, it can last longer if it is a sign of something more serious. It is important to treat your diarrhea as told by your doctor. Follow these instructions at home: Eating and drinking   Follow these recommendations as told by your doctor:  Take an oral rehydration solution (ORS). This is a drink that is sold at pharmacies and stores.  Drink clear fluids, such as:  Water.  Ice chips.  Diluted fruit juice.  Low-calorie sports drinks.  Eat bland, easy-to-digest foods in small amounts as you are able. These foods include:  Bananas.  Applesauce.  Rice.  Low-fat (lean) meats.  Toast.  Crackers.  Avoid drinking fluids that have a lot of sugar or caffeine in them.  Avoid alcohol.  Avoid spicy or fatty foods. General instructions    Drink enough fluid to keep your pee (urine) clear or pale yellow.  Wash your hands often. If you cannot use soap and water, use hand sanitizer.  Make sure that all people in your home wash their hands well and often.  Take over-the-counter and prescription medicines only as told by your doctor.  Rest at home while you get better.  Watch your condition for any changes.  Take a warm bath to help with any burning or pain from having diarrhea.  Keep all follow-up visits as told by your doctor. This is important. Contact a doctor if:  You have a fever.  Your diarrhea gets worse.  You have new symptoms.  You cannot keep fluids down.  You feel light-headed or dizzy.  You have a headache.  You have muscle cramps. Get help right away if:  You have chest pain.  You feel very weak or you pass out (faint).  You have bloody or black poop or  poop that look like tar.  You have very bad pain, cramping, or bloating in your belly (abdomen).  You have trouble breathing or you are breathing very quickly.  Your heart is beating very quickly.  Your skin feels cold and clammy.  You feel confused.  You have signs of dehydration, such as:  Dark pee, hardly any pee, or no pee.  Cracked lips.  Dry mouth.  Sunken eyes.  Sleepiness.  Weakness. This information is not intended to replace advice given to you by your health care provider. Make sure you discuss any questions you have with your health care provider. Document Released: 10/06/2007 Document Revised: 11/07/2015 Document Reviewed: 12/24/2014 Elsevier Interactive Patient Education  2017 [employer].  Cholestyramine powder for oral suspension What is this medicine? CHOLESTYRAMINE (koe LESS tir a meen) is used to lower cholesterol in patients who are at risk of heart disease or stroke. This medicine is only for patients whose cholesterol level is not controlled by diet. This medicine may be used for other purposes; ask your health care provider or pharmacist if you have questions. COMMON BRAND NAME(S): Locholest, Locholest Light, Prevalite, Questran, Questran Light What should I tell my health care provider before I take this medicine? They need to know if you have any of these conditions: -blocked bile duct -an unusual or allergic reaction to cholestyramine, other medicines, foods, dyes, or preservatives -pregnant or trying to get  pregnant -breast-feeding How should I use this medicine? Do not take this medicine in the dry form. It must be mixed with a liquid before swallowing. Follow the directions on the prescription label. Place the powder in a glass or cup. Add between 2 and 6 ounces of fluid. This can be water, milk, pulpy fruit juice, fluid soup, or other liquid. Mix well and drink all of the liquid. Take your doses at regular intervals. Do not take your medicine  more often than directed. Talk to your pediatrician regarding the use of this medicine in children. Special care may be needed. Overdosage: If you think you have taken too much of this medicine contact a poison control center or emergency room at once. NOTE: This medicine is only for you. Do not share this medicine with others. What if I miss a dose? If you miss a dose, take it as soon as you can. If it is almost time for your next dose, take only that dose. Do not take double or extra doses. What may interact with this medicine? -diuretics -female hormones, like estrogens or progestins and birth control pills -heart medicines such as digoxin or digitoxin -penicillin G -phenobarbital -phenylbutazone -phytonadione -propranolol -tetracycline antibiotics -thyroid hormones -vitamin A -vitamin D -vitamin E -warfarin Take other drugs at least 1 hour before or 4 hours after this medicine, to avoid decreasing their absorption. This list may not describe all possible interactions. Give your health care provider a list of all the medicines, herbs, non-prescription drugs, or dietary supplements you use. Also tell them if you smoke, drink alcohol, or use illegal drugs. Some items may interact with your medicine. What should I watch for while using this medicine? Visit your doctor or health care professional for regular checks on your progress. Your blood fats and other tests will be measured from time to time. This medicine is only part of a total cholesterol-lowering program. Your health care professional or dietician can suggest a low-cholesterol and low-fat diet that will reduce your risk of getting heart and blood vessel disease. Avoid alcohol and smoking, and keep a proper exercise schedule. To reduce the chance of getting constipated, drink plenty of water and increase the amount of fiber in your diet. Ask your doctor or health care professional for advice if you are constipated. What side  effects may I notice from receiving this medicine? Side effects that you should report to your doctor or health care professional as soon as possible: -allergic reactions like skin rash, itching or hives, swelling of the face, lips, or tongue -bloody or black, tarry stools -severe stomach pain with nausea and vomiting -unusual bleeding Side effects that usually do not require medical attention (report to your doctor or health care professional if they continue or are bothersome): -constipation or diarrhea -dizziness -headache -heartburn, indigestion -nausea, vomiting -perianal irritation This list may not describe all possible side effects. Call your doctor for medical advice about side effects. You may report side effects to FDA at 1-800-FDA-1088. Where should I keep my medicine? Keep out of the reach of children. Store at room temperature between 15 and 30 degrees C (59 and 86 degrees F). Throw away any unused medicine after the expiration date. NOTE: This sheet is a summary. It may not cover all possible information. If you have questions about this medicine, talk to your doctor, pharmacist, or health care provider.  2018 Elsevier/Gold Standard (2007-07-25 15:33:42)

## 2016-07-23 NOTE — Progress Notes (Signed)
Subjective:  I acted as a Education administrator for Dr. Royden Purl, LPN    Patient ID: Sarah Salazar, female    DOB: 1956-04-07, 61 y.o.   MRN: [id number]  Chief Complaint  Patient presents with  . Diarrhea    follow up  . Nausea    follow up    Diarrhea   This is a recurrent problem. The current episode started more than 1 month ago. Pertinent negatives include no coughing, fever, headaches or vomiting.    Patient is in today for follow up on GI problems, diarrhea, and nausea. Patient report she went to see an GI physician and he referred her to a psychiatrists. Patient report her diarrhea and nausea is the same as it was last ov, not getting any better. Patient report zofran is not working at all. Patient state she still eat saltine crackers, and drink ice tea, ginger ale, and or pepsi.  Patient Care Team: Ann Held, DO as PCP - General (Family Medicine)   Past Medical History:  Diagnosis Date  . Anxiety   . Arthritis    both thumbs  . Bipolar disorder ([place])   . Bulging lumbar disc    "L3-5" (08/01/2013)  . Chest pain    Emergency room April 23, 2012, troponin is normal, chest CT scan showed no pulmonary embolus  . Complication of anesthesia    "I'm allergic to versed and fentanyl"  . Cough    Chronic cough  . Depression   . Ejection fraction    EF 55-60%, echo, December, 2013, lipomatous hypertrophy of the atrial septum  . Family history of anesthesia complication    "hard time waking daughter up post SVT ablation"  . GERD (gastroesophageal reflux disease)   . Hypertension   . Hypothyroidism    hypothyroidism  . IBS (irritable bowel syndrome)   . Personal history of colonic adenoma 06/28/2012  . Personal history of failed moderate sedation 06/20/2012  . Pneumonia    "couple times" (08/01/2013)  . Sleep apnea    does not use CPAP    Past Surgical History:  Procedure Laterality Date  . BREAST LUMPECTOMY Left    "1st breast OR"  . CHOLECYSTECTOMY    .  COLONOSCOPY  2002  . CYSTOCELE REPAIR  01/13/2009  . DILATION AND CURETTAGE OF UTERUS  X 3  . ELBOW SURGERY Left 2005  . ESOPHAGOGASTRODUODENOSCOPY (EGD) WITH ESOPHAGEAL DILATION  2004; 2014  . KNEE ARTHROSCOPY Right X 2  . LAPAROSCOPY  1970's - 06/03/1990   "several; to evaluate dysfunctional menses & pelvic pain"  . MASTECTOMY, PARTIAL Left    "2nd breast OR", benign fibrocystic changes  . WISDOM TOOTH EXTRACTION     "all 4 at once"  . WRIST SURGERY  1979-~ 2010   X 4    Family History  Problem Relation Age of Onset  . Coronary artery disease Mother   . Stroke Mother     TIA  . Uterine cancer Mother     BREAST  . Thyroid disease Mother     goiter  . Lung cancer Father   . Heart disease Father   . COPD Father   . Coronary artery disease Father   . Colon cancer Paternal Uncle     COLON; STOMACH  . Lung cancer Maternal Grandfather   . Rheum arthritis Maternal Aunt   . Supraventricular tachycardia Daughter     S/P ablation  . Thyroid disease Cousin     Social  History   Social History  . Marital status: Divorced    Spouse name: N/A  . Number of children: 1  . Years of education: N/A   Occupational History  .  [employer]   Social History Main Topics  . Smoking status: Never Smoker  . Smokeless tobacco: Never Used  . Alcohol use Yes     Comment: 08/01/2013 "glass of wine 6 times/year at most"  . Drug use: No  . Sexual activity: Yes    Birth control/ protection: Post-menopausal   Other Topics Concern  . Not on file   Social History Narrative   Divorced, has boyfriend   1 daughter   Patient accounts at [employer]   1 caffeinated beverage/day             Outpatient Medications Prior to Visit  Medication Sig Dispense Refill  . ALPRAZolam (XANAX) 1 MG tablet Take 1 tablet (1 mg total) by mouth 3 (three) times daily as needed. 60 tablet 0  . ARMOUR THYROID 90 MG tablet TAKE ONE-HALF (1/2) TABLET DAILY 45 tablet 1  . buPROPion (WELLBUTRIN) 100 MG tablet Take 1  tablet by mouth 2 (two) times daily.    . busPIRone (BUSPAR) 10 MG tablet Take 1 tablet (10 mg total) by mouth 2 (two) times daily. 60 tablet 2  . fluticasone (FLONASE) 50 MCG/ACT nasal spray Place 2 sprays into both nostrils daily. 16 g 1  . hyoscyamine (LEVSIN SL) 0.125 MG SL tablet Place 1 tablet (0.125 mg total) under the tongue every 4 (four) hours as needed. 90 tablet 3  . lamoTRIgine (LAMICTAL) 100 MG tablet Take 1 tablet by mouth daily.     Marland Kitchen levocetirizine (XYZAL) 5 MG tablet Take 1 tablet (5 mg total) by mouth every evening. 30 tablet 5  . meclizine (ANTIVERT) 12.5 MG tablet Take 1 tablet (12.5 mg total) by mouth 3 (three) times daily as needed for dizziness. 30 tablet 0  . metaxalone (SKELAXIN) 800 MG tablet as needed.     . metoprolol tartrate (LOPRESSOR) 25 MG tablet 1 tab po twice daily 60 tablet 2  . metoprolol tartrate (LOPRESSOR) 25 MG tablet TAKE 1/2 TABLET BY MOUTH TWICE DAILY 180 tablet 0  . ondansetron (ZOFRAN) 4 MG tablet Take 1 tablet (4 mg total) by mouth every 8 (eight) hours as needed for nausea or vomiting. 90 tablet 2  . pantoprazole (PROTONIX) 40 MG tablet TAKE 1 TABLET DAILY 90 tablet 1  . promethazine (PHENERGAN) 12.5 MG tablet Take 1 tablet (12.5 mg total) by mouth every 8 (eight) hours as needed for nausea or vomiting. 20 tablet 0  . promethazine-dextromethorphan (PROMETHAZINE-DM) 6.25-15 MG/5ML syrup Take 2.5 mLs by mouth 4 (four) times daily as needed for cough. 118 mL 0  . traMADol (ULTRAM) 50 MG tablet Take 1 tablet (50 mg total) by mouth every 6 (six) hours as needed. 30 tablet 0   No facility-administered medications prior to visit.     Allergies  Allergen Reactions  . Amoxicillin Rash       . Fentanyl Rash    given with Versed  . Midazolam Rash    given with Fentanyl  . Oxycodone-Aspirin Rash  . Sulfonamide Derivatives Rash    rash  . Hydrocodone Hives and Nausea And Vomiting  . Nalbuphine Nausea And Vomiting  . Oxycodone-Acetaminophen Hives  and Nausea And Vomiting  . Venlafaxine Other (See Comments)    Patient says makes her crazy  . Verapamil Palpitations  Caused "pounding " in chest    Review of Systems  Constitutional: Negative for fever.  HENT: Negative for congestion.   Eyes: Negative for blurred vision.  Respiratory: Negative for cough.   Cardiovascular: Negative for chest pain and palpitations.  Gastrointestinal: Positive for diarrhea. Negative for vomiting.  Musculoskeletal: Negative for back pain.  Skin: Negative for rash.  Neurological: Negative for loss of consciousness and headaches.       Objective:    Physical Exam  Constitutional: She is oriented to person, place, and time. She appears well-developed and well-nourished. No distress.  HENT:  Head: Normocephalic and atraumatic.  Eyes: Conjunctivae are normal. Pupils are equal, round, and reactive to light.  Neck: Normal range of motion. No thyromegaly present.  Cardiovascular: Normal rate and regular rhythm.   Pulmonary/Chest: Effort normal and breath sounds normal. She has no wheezes.  Abdominal: Soft. Bowel sounds are normal. There is no tenderness.  Musculoskeletal: Normal range of motion. She exhibits no edema or deformity.  Neurological: She is alert and oriented to person, place, and time.  Skin: Skin is warm and dry. She is not diaphoretic.  Psychiatric: She has a normal mood and affect.    BP 138/82 (BP Location: Left Arm, Patient Position: Sitting, Cuff Size: Normal)   Pulse (!) 51   Temp 98.2 F (36.8 C) (Oral)   Resp 16   Ht 5\' 5"  (1.651 m)   Wt 186 lb 9.6 oz (84.6 kg)   SpO2 98%   BMI 31.05 kg/m  Wt Readings from Last 3 Encounters:  07/23/16 186 lb 9.6 oz (84.6 kg)  07/02/16 182 lb 6 oz (82.7 kg)  06/28/16 184 lb 3.2 oz (83.6 kg)   BP Readings from Last 3 Encounters:  07/23/16 138/82  07/02/16 138/80  06/28/16 118/72     Immunization History  Administered Date(s) Administered  . Influenza Split 01/02/2011  .  Influenza Whole 01/23/2008, 01/31/2009  . Influenza,inj,Quad PF,36+ Mos 04/11/2013, 03/18/2015, 02/18/2016  . Tdap 04/11/2013    Health Maintenance  Topic Date Due  . HIV Screening  01/31/1973  . MAMMOGRAM  01/13/2017  . COLONOSCOPY  06/21/2017  . PAP SMEAR  12/05/2017  . TETANUS/TDAP  04/12/2023  . INFLUENZA VACCINE  Completed  . Hepatitis C Screening  Completed    Lab Results  Component Value Date   WBC 8.5 04/11/2015   HGB 14.3 04/11/2015   HCT 43.7 04/11/2015   PLT 228.0 04/11/2015   GLUCOSE 95 04/11/2015   CHOL 183 03/18/2015   TRIG 133.0 03/18/2015   HDL 38.20 (L) 03/18/2015   LDLDIRECT 126.5 10/12/2006   LDLCALC 118 (H) 03/18/2015   ALT 26 04/11/2015   AST 24 04/11/2015   NA 140 04/11/2015   K 4.2 04/11/2015   CL 105 04/11/2015   CREATININE 1.03 04/11/2015   BUN 8 04/11/2015   CO2 31 04/11/2015   TSH 2.50 05/07/2016   INR 1.05 08/01/2013   HGBA1C 5.6 08/01/2013    Lab Results  Component Value Date   TSH 2.50 05/07/2016   Lab Results  Component Value Date   WBC 8.5 04/11/2015   HGB 14.3 04/11/2015   HCT 43.7 04/11/2015   MCV 93.2 04/11/2015   PLT 228.0 04/11/2015   Lab Results  Component Value Date   NA 140 04/11/2015   K 4.2 04/11/2015   CO2 31 04/11/2015   GLUCOSE 95 04/11/2015   BUN 8 04/11/2015   CREATININE 1.03 04/11/2015   BILITOT 0.4 04/11/2015   ALKPHOS  89 04/11/2015   AST 24 04/11/2015   ALT 26 04/11/2015   PROT 7.4 04/11/2015   ALBUMIN 4.1 04/11/2015   CALCIUM 9.6 04/11/2015   GFR 58.66 (L) 04/11/2015   Lab Results  Component Value Date   CHOL 183 03/18/2015   Lab Results  Component Value Date   HDL 38.20 (L) 03/18/2015   Lab Results  Component Value Date   LDLCALC 118 (H) 03/18/2015   Lab Results  Component Value Date   TRIG 133.0 03/18/2015   Lab Results  Component Value Date   CHOLHDL 5 03/18/2015   Lab Results  Component Value Date   HGBA1C 5.6 08/01/2013         Assessment & Plan:   Problem List  Items Addressed This Visit      Unprioritized   Diarrhea - Primary    Started after GB surgery Try questran and refer to GI at [facility] at pt request rto prn      Relevant Medications   cholestyramine (QUESTRAN) 4 g packet   Other Relevant Orders   Ambulatory referral to Gastroenterology    Other Visit Diagnoses    Dyspepsia       Relevant Orders   Ambulatory referral to Gastroenterology      I am having Ms. Monterroso start on cholestyramine. I am also having her maintain her traMADol, metaxalone, hyoscyamine, ALPRAZolam, ARMOUR THYROID, pantoprazole, buPROPion, lamoTRIgine, levocetirizine, promethazine-dextromethorphan, fluticasone, meclizine, metoprolol tartrate, busPIRone, ondansetron, metoprolol tartrate, and promethazine.  Meds ordered this encounter  Medications  . cholestyramine (QUESTRAN) 4 g packet    Sig: Take 1 packet (4 g total) by mouth 3 (three) times daily with meals.    Dispense:  60 each    Refill:  12    CMA served as scribe during this visit. History, Physical and Plan performed by medical provider. Documentation and orders reviewed and attested to.  Ann Held, DO   Patient ID: Sarah Salazar, female   DOB: 15-Dec-1955, 61 y.o.   MRN: [id number]

## 2016-07-24 DIAGNOSIS — R197 Diarrhea, unspecified: Secondary | ICD-10-CM | POA: Insufficient documentation

## 2016-07-24 NOTE — Assessment & Plan Note (Signed)
Started after GB surgery Try Lucrezia Starch and refer to GI at [facility] at pt request rto prn

## 2016-09-01 ENCOUNTER — Ambulatory Visit (INDEPENDENT_AMBULATORY_CARE_PROVIDER_SITE_OTHER): Payer: BLUE CROSS/BLUE SHIELD | Admitting: Psychiatry

## 2016-09-01 ENCOUNTER — Encounter (HOSPITAL_COMMUNITY): Payer: Self-pay | Admitting: Psychiatry

## 2016-09-01 VITALS — BP 140/78 | HR 64 | Ht 65.5 in | Wt 189.4 lb

## 2016-09-01 DIAGNOSIS — F3341 Major depressive disorder, recurrent, in partial remission: Secondary | ICD-10-CM

## 2016-09-01 DIAGNOSIS — Z79899 Other long term (current) drug therapy: Secondary | ICD-10-CM | POA: Diagnosis not present

## 2016-09-01 MED ORDER — MIRTAZAPINE 15 MG PO TABS: 15.0000 mg | ORAL_TABLET | Freq: Every day | ORAL | 2 refills | Status: DC

## 2016-09-01 MED ORDER — LAMOTRIGINE 100 MG PO TABS: 100.0000 mg | ORAL_TABLET | Freq: Every day | ORAL | 2 refills | Status: DC
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
## 2016-09-01 NOTE — Patient Instructions (Signed)
STOP Buspar   STOP Wellbutrin  Start remeron  Continue lamictal  Take all medicines at around 7 PM or with supper

## 2016-09-02 ENCOUNTER — Telehealth (HOSPITAL_COMMUNITY): Payer: Self-pay

## 2016-09-02 NOTE — Telephone Encounter (Signed)
Excellent! I agree, thank you!

## 2016-09-02 NOTE — Telephone Encounter (Signed)
Patient is calling because she took the Remeron last night (15 mg) at about 8, she slept until around 11:30 am and she is still feeling groggy and tired, she said she thinks it is too much. I told her to try a half dose for the weekend and see how she does, if it is still too much she will call me back on Monday, if she does okay she will attempt to go back to the full dose.

## 2016-09-10 ENCOUNTER — Ambulatory Visit: Payer: BLUE CROSS/BLUE SHIELD | Admitting: Internal Medicine

## 2016-10-05 ENCOUNTER — Other Ambulatory Visit (HOSPITAL_COMMUNITY): Payer: Self-pay

## 2016-10-05 DIAGNOSIS — F3341 Major depressive disorder, recurrent, in partial remission: Secondary | ICD-10-CM

## 2016-10-05 MED ORDER — MIRTAZAPINE 15 MG PO TABS: 15.0000 mg | ORAL_TABLET | Freq: Every day | ORAL | 0 refills | Status: DC

## 2016-10-05 MED ORDER — LAMOTRIGINE 100 MG PO TABS: 100.0000 mg | ORAL_TABLET | Freq: Every day | ORAL | 0 refills | Status: DC

## 2016-10-08 ENCOUNTER — Other Ambulatory Visit: Payer: Self-pay | Admitting: Orthopedic Surgery

## 2016-10-14 ENCOUNTER — Telehealth: Payer: Self-pay | Admitting: Family Medicine

## 2016-10-14 MED ORDER — METOPROLOL TARTRATE 25 MG PO TABS: ORAL_TABLET | ORAL | 0 refills | Status: DC

## 2016-10-14 MED ORDER — PANTOPRAZOLE SODIUM 40 MG PO TBEC: 40.0000 mg | DELAYED_RELEASE_TABLET | Freq: Every day | ORAL | 1 refills | Status: DC

## 2016-10-14 NOTE — Telephone Encounter (Signed)
Sent in as requested and patient notified.

## 2016-10-14 NOTE — Telephone Encounter (Signed)
Caller name:Kerianna Girdner Relationship to patient: Can be reached:[phone] Pharmacy:  Reason for call:Requesting refill on metoprolol 25mg  and pantoprazole 12.5mg . Please call into [place] [address] Ph # [phone]

## 2016-10-21 ENCOUNTER — Encounter (HOSPITAL_BASED_OUTPATIENT_CLINIC_OR_DEPARTMENT_OTHER): Payer: Self-pay | Admitting: *Deleted

## 2016-10-27 ENCOUNTER — Other Ambulatory Visit: Payer: Self-pay

## 2016-10-27 ENCOUNTER — Encounter (HOSPITAL_BASED_OUTPATIENT_CLINIC_OR_DEPARTMENT_OTHER)
Admission: RE | Admit: 2016-10-27 | Discharge: 2016-10-27 | Disposition: A | Discharge status: Home / Self Care | Payer: BLUE CROSS/BLUE SHIELD | Source: Ambulatory Visit | Attending: Orthopedic Surgery | Admitting: Orthopedic Surgery

## 2016-10-27 DIAGNOSIS — Z882 Allergy status to sulfonamides status: Secondary | ICD-10-CM | POA: Diagnosis not present

## 2016-10-27 DIAGNOSIS — Z79899 Other long term (current) drug therapy: Secondary | ICD-10-CM | POA: Diagnosis not present

## 2016-10-27 DIAGNOSIS — K589 Irritable bowel syndrome without diarrhea: Secondary | ICD-10-CM | POA: Diagnosis not present

## 2016-10-27 DIAGNOSIS — G5602 Carpal tunnel syndrome, left upper limb: Secondary | ICD-10-CM | POA: Diagnosis present

## 2016-10-27 DIAGNOSIS — G4730 Sleep apnea, unspecified: Secondary | ICD-10-CM | POA: Diagnosis not present

## 2016-10-27 DIAGNOSIS — K219 Gastro-esophageal reflux disease without esophagitis: Secondary | ICD-10-CM | POA: Diagnosis not present

## 2016-10-27 DIAGNOSIS — E039 Hypothyroidism, unspecified: Secondary | ICD-10-CM | POA: Diagnosis not present

## 2016-10-27 DIAGNOSIS — F319 Bipolar disorder, unspecified: Secondary | ICD-10-CM | POA: Diagnosis not present

## 2016-10-27 DIAGNOSIS — I10 Essential (primary) hypertension: Secondary | ICD-10-CM | POA: Diagnosis not present

## 2016-10-27 DIAGNOSIS — Z880 Allergy status to penicillin: Secondary | ICD-10-CM | POA: Diagnosis not present

## 2016-10-27 DIAGNOSIS — M67432 Ganglion, left wrist: Secondary | ICD-10-CM | POA: Diagnosis not present

## 2016-10-27 DIAGNOSIS — F419 Anxiety disorder, unspecified: Secondary | ICD-10-CM | POA: Diagnosis not present

## 2016-10-28 ENCOUNTER — Encounter (HOSPITAL_BASED_OUTPATIENT_CLINIC_OR_DEPARTMENT_OTHER)
Admission: RE | Disposition: A | Discharge status: Home / Self Care | Payer: Self-pay | Source: Ambulatory Visit | Attending: Orthopedic Surgery

## 2016-10-28 ENCOUNTER — Encounter (HOSPITAL_BASED_OUTPATIENT_CLINIC_OR_DEPARTMENT_OTHER): Payer: Self-pay | Admitting: Anesthesiology

## 2016-10-28 ENCOUNTER — Ambulatory Visit (HOSPITAL_BASED_OUTPATIENT_CLINIC_OR_DEPARTMENT_OTHER)
Admission: RE | Admit: 2016-10-28 | Discharge: 2016-10-28 | Disposition: A | Discharge status: Home / Self Care | Payer: BLUE CROSS/BLUE SHIELD | Source: Ambulatory Visit | Attending: Orthopedic Surgery | Admitting: Orthopedic Surgery

## 2016-10-28 ENCOUNTER — Ambulatory Visit (HOSPITAL_BASED_OUTPATIENT_CLINIC_OR_DEPARTMENT_OTHER): Payer: BLUE CROSS/BLUE SHIELD | Admitting: Anesthesiology

## 2016-10-28 DIAGNOSIS — G5602 Carpal tunnel syndrome, left upper limb: Secondary | ICD-10-CM | POA: Diagnosis not present

## 2016-10-28 DIAGNOSIS — K589 Irritable bowel syndrome without diarrhea: Secondary | ICD-10-CM | POA: Insufficient documentation

## 2016-10-28 DIAGNOSIS — F419 Anxiety disorder, unspecified: Secondary | ICD-10-CM | POA: Insufficient documentation

## 2016-10-28 DIAGNOSIS — E039 Hypothyroidism, unspecified: Secondary | ICD-10-CM | POA: Insufficient documentation

## 2016-10-28 DIAGNOSIS — F319 Bipolar disorder, unspecified: Secondary | ICD-10-CM | POA: Insufficient documentation

## 2016-10-28 DIAGNOSIS — Z79899 Other long term (current) drug therapy: Secondary | ICD-10-CM | POA: Insufficient documentation

## 2016-10-28 DIAGNOSIS — G4730 Sleep apnea, unspecified: Secondary | ICD-10-CM | POA: Insufficient documentation

## 2016-10-28 DIAGNOSIS — M67432 Ganglion, left wrist: Secondary | ICD-10-CM | POA: Insufficient documentation

## 2016-10-28 DIAGNOSIS — Z880 Allergy status to penicillin: Secondary | ICD-10-CM | POA: Insufficient documentation

## 2016-10-28 DIAGNOSIS — I10 Essential (primary) hypertension: Secondary | ICD-10-CM | POA: Insufficient documentation

## 2016-10-28 DIAGNOSIS — K219 Gastro-esophageal reflux disease without esophagitis: Secondary | ICD-10-CM | POA: Insufficient documentation

## 2016-10-28 DIAGNOSIS — Z882 Allergy status to sulfonamides status: Secondary | ICD-10-CM | POA: Insufficient documentation

## 2016-10-28 HISTORY — PX: EAR CYST EXCISION: SHX22

## 2016-10-28 HISTORY — PX: CARPAL TUNNEL RELEASE: SHX101

## 2016-10-28 SURGERY — CARPAL TUNNEL RELEASE
Anesthesia: General | Laterality: Left

## 2016-10-28 MED ORDER — PROPOFOL 10 MG/ML IV BOLUS
INTRAVENOUS | Status: DC | PRN
  Administered 2016-10-28: 200 mg via INTRAVENOUS
  Administered 2016-10-28: 50 mg via INTRAVENOUS

## 2016-10-28 MED ORDER — ONDANSETRON HCL 4 MG/2ML IJ SOLN
INTRAMUSCULAR | Status: DC | PRN
  Administered 2016-10-28: 4 mg via INTRAVENOUS

## 2016-10-28 MED ORDER — VANCOMYCIN HCL IN DEXTROSE 1-5 GM/200ML-% IV SOLN: 1000.0000 mg | INTRAVENOUS | Status: DC

## 2016-10-28 MED ORDER — ONDANSETRON HCL 4 MG/2ML IJ SOLN
INTRAMUSCULAR | Status: AC
  Filled 2016-10-28: qty 2

## 2016-10-28 MED ORDER — LIDOCAINE 2% (20 MG/ML) 5 ML SYRINGE
INTRAMUSCULAR | Status: AC
  Filled 2016-10-28: qty 5

## 2016-10-28 MED ORDER — TRAMADOL HCL 50 MG PO TABS: 50.0000 mg | ORAL_TABLET | Freq: Four times a day (QID) | ORAL | 0 refills | Status: DC | PRN

## 2016-10-28 MED ORDER — HYDROMORPHONE HCL 1 MG/ML IJ SOLN
INTRAMUSCULAR | Status: AC
  Filled 2016-10-28: qty 0.5

## 2016-10-28 MED ORDER — PROMETHAZINE HCL 25 MG/ML IJ SOLN: 6.2500 mg | INTRAMUSCULAR | Status: DC | PRN

## 2016-10-28 MED ORDER — PROPOFOL 10 MG/ML IV BOLUS
INTRAVENOUS | Status: AC
  Filled 2016-10-28: qty 20

## 2016-10-28 MED ORDER — MORPHINE SULFATE (PF) 10 MG/ML IV SOLN
INTRAVENOUS | Status: AC
  Filled 2016-10-28: qty 1

## 2016-10-28 MED ORDER — MORPHINE SULFATE 10 MG/ML IJ SOLN
INTRAMUSCULAR | Status: DC | PRN
  Administered 2016-10-28 (×2): 2 mg via INTRAVENOUS

## 2016-10-28 MED ORDER — MIDAZOLAM HCL 2 MG/2ML IJ SOLN
INTRAMUSCULAR | Status: AC
  Filled 2016-10-28: qty 2

## 2016-10-28 MED ORDER — TRAMADOL HCL 50 MG PO TABS
50.0000 mg | ORAL_TABLET | Freq: Once | ORAL | Status: AC
  Administered 2016-10-28: 50 mg via ORAL

## 2016-10-28 MED ORDER — HYDROMORPHONE HCL 1 MG/ML IJ SOLN
0.2500 mg | INTRAMUSCULAR | Status: DC | PRN
  Administered 2016-10-28 (×2): 0.5 mg via INTRAVENOUS

## 2016-10-28 MED ORDER — LIDOCAINE HCL (CARDIAC) 20 MG/ML IV SOLN
INTRAVENOUS | Status: DC | PRN
  Administered 2016-10-28: 30 mg via INTRAVENOUS

## 2016-10-28 MED ORDER — VANCOMYCIN HCL IN DEXTROSE 1-5 GM/200ML-% IV SOLN
INTRAVENOUS | Status: AC
  Filled 2016-10-28: qty 200

## 2016-10-28 MED ORDER — LACTATED RINGERS IV SOLN
INTRAVENOUS | Status: DC
  Administered 2016-10-28: 11:00:00 via INTRAVENOUS

## 2016-10-28 MED ORDER — FENTANYL CITRATE (PF) 100 MCG/2ML IJ SOLN
INTRAMUSCULAR | Status: AC
  Filled 2016-10-28: qty 2

## 2016-10-28 MED ORDER — CHLORHEXIDINE GLUCONATE 4 % EX LIQD: 60.0000 mL | Freq: Once | CUTANEOUS | Status: DC

## 2016-10-28 MED ORDER — TRAMADOL HCL 50 MG PO TABS
ORAL_TABLET | ORAL | Status: AC
  Filled 2016-10-28: qty 1

## 2016-10-28 SURGICAL SUPPLY — 52 items
BANDAGE COBAN STERILE 2 (GAUZE/BANDAGES/DRESSINGS) IMPLANT
BLADE MINI RND TIP GREEN BEAV (BLADE) IMPLANT
BLADE SURG 15 STRL LF DISP TIS (BLADE) ×1 IMPLANT
BLADE SURG 15 STRL SS (BLADE) ×2
BNDG CMPR 9X4 STRL LF SNTH (GAUZE/BANDAGES/DRESSINGS)
BNDG COHESIVE 1X5 TAN STRL LF (GAUZE/BANDAGES/DRESSINGS) IMPLANT
BNDG COHESIVE 3X5 TAN STRL LF (GAUZE/BANDAGES/DRESSINGS) ×2 IMPLANT
BNDG ESMARK 4X9 LF (GAUZE/BANDAGES/DRESSINGS) IMPLANT
BNDG GAUZE ELAST 4 BULKY (GAUZE/BANDAGES/DRESSINGS) ×2 IMPLANT
CHLORAPREP W/TINT 26ML (MISCELLANEOUS) ×2 IMPLANT
CORDS BIPOLAR (ELECTRODE) ×2 IMPLANT
COVER BACK TABLE 60X90IN (DRAPES) ×2 IMPLANT
COVER MAYO STAND STRL (DRAPES) ×2 IMPLANT
CUFF TOURNIQUET SINGLE 18IN (TOURNIQUET CUFF) ×2 IMPLANT
DECANTER SPIKE VIAL GLASS SM (MISCELLANEOUS) IMPLANT
DRAIN PENROSE 1/2X12 LTX STRL (WOUND CARE) IMPLANT
DRAPE EXTREMITY T 121X128X90 (DRAPE) ×2 IMPLANT
DRAPE SURG 17X23 STRL (DRAPES) ×2 IMPLANT
DRSG PAD ABDOMINAL 8X10 ST (GAUZE/BANDAGES/DRESSINGS) ×2 IMPLANT
GAUZE SPONGE 4X4 12PLY STRL (GAUZE/BANDAGES/DRESSINGS) ×2 IMPLANT
GAUZE XEROFORM 1X8 LF (GAUZE/BANDAGES/DRESSINGS) ×2 IMPLANT
GLOVE BIOGEL PI IND STRL 7.0 (GLOVE) IMPLANT
GLOVE BIOGEL PI IND STRL 7.5 (GLOVE) IMPLANT
GLOVE BIOGEL PI IND STRL 8.5 (GLOVE) ×1 IMPLANT
GLOVE BIOGEL PI INDICATOR 7.0 (GLOVE) ×2
GLOVE BIOGEL PI INDICATOR 7.5 (GLOVE) ×1
GLOVE BIOGEL PI INDICATOR 8.5 (GLOVE) ×1
GLOVE SURG ORTHO 8.0 STRL STRW (GLOVE) ×2 IMPLANT
GLOVE SURG SS PI 7.0 STRL IVOR (GLOVE) ×1 IMPLANT
GOWN STRL REUS W/ TWL LRG LVL3 (GOWN DISPOSABLE) ×1 IMPLANT
GOWN STRL REUS W/TWL LRG LVL3 (GOWN DISPOSABLE) ×2
GOWN STRL REUS W/TWL XL LVL3 (GOWN DISPOSABLE) ×3 IMPLANT
NDL PRECISIONGLIDE 27X1.5 (NEEDLE) IMPLANT
NEEDLE PRECISIONGLIDE 27X1.5 (NEEDLE) IMPLANT
NS IRRIG 1000ML POUR BTL (IV SOLUTION) ×2 IMPLANT
PACK BASIN DAY SURGERY FS (CUSTOM PROCEDURE TRAY) ×2 IMPLANT
PAD CAST 3X4 CTTN HI CHSV (CAST SUPPLIES) IMPLANT
PADDING CAST ABS 3INX4YD NS (CAST SUPPLIES)
PADDING CAST ABS 4INX4YD NS (CAST SUPPLIES) ×1
PADDING CAST ABS COTTON 3X4 (CAST SUPPLIES) IMPLANT
PADDING CAST ABS COTTON 4X4 ST (CAST SUPPLIES) ×1 IMPLANT
PADDING CAST COTTON 3X4 STRL (CAST SUPPLIES)
SPLINT PLASTER CAST XFAST 3X15 (CAST SUPPLIES) IMPLANT
SPLINT PLASTER XTRA FASTSET 3X (CAST SUPPLIES)
STOCKINETTE 4X48 STRL (DRAPES) ×2 IMPLANT
SUT ETHILON 4 0 PS 2 18 (SUTURE) ×2 IMPLANT
SUT VIC AB 4-0 P2 18 (SUTURE) IMPLANT
SUT VICRYL 4-0 PS2 18IN ABS (SUTURE) IMPLANT
SYR BULB 3OZ (MISCELLANEOUS) ×2 IMPLANT
SYR CONTROL 10ML LL (SYRINGE) IMPLANT
TOWEL OR 17X24 6PK STRL BLUE (TOWEL DISPOSABLE) ×4 IMPLANT
UNDERPAD 30X30 (UNDERPADS AND DIAPERS) ×2 IMPLANT

## 2016-10-28 NOTE — Anesthesia Postprocedure Evaluation (Signed)
Anesthesia Post Note  Patient: Sarah Salazar  Procedure(s) Performed: Procedure(s) (LRB): CARPAL TUNNEL RELEASE (Left) EXCISION GANGLION CYST LEFT DORSAL WRIST (Left)     Patient location during evaluation: PACU Anesthesia Type: General Level of consciousness: sedated and patient cooperative Pain management: pain level controlled Vital Signs Assessment: post-procedure vital signs reviewed and stable Respiratory status: spontaneous breathing Cardiovascular status: stable Anesthetic complications: no    Last Vitals:  Vitals:   10/28/16 1330 10/28/16 1406  BP: (!) 166/93 (!) 148/92  Pulse: (!) 54 (!) 53  Resp: 20 18  Temp:  36.6 C    Last Pain:  Vitals:   10/28/16 1406  TempSrc:   PainSc: [place]

## 2016-10-28 NOTE — Op Note (Signed)
Dictation Number [id number]

## 2016-10-28 NOTE — Brief Op Note (Signed)
10/28/2016  12:45 PM  PATIENT:  Barrington Ellison  61 y.o. female  PRE-OPERATIVE DIAGNOSIS:  LEFT CARPAL TUNNEL SYNDROME, DORSAL CYST LEFT WRIST  POST-OPERATIVE DIAGNOSIS:  LEFT CARPAL TUNNEL SYNDROME, DORSAL CYST LEFT WRIST  PROCEDURE:  Procedure(s): CARPAL TUNNEL RELEASE (Left) EXCISION GANGLION CYST LEFT DORSAL WRIST (Left)  SURGEON:  Surgeon(s) and Role:    * Daryll Brod, MD - Primary  PHYSICIAN ASSISTANT:   ASSISTANTS: none   ANESTHESIA:  general  EBL:  Total I/O In: 800 [I.V.:800] Out: 1 [Blood:1]  BLOOD ADMINISTERED:none  DRAINS: none   LOCAL MEDICATIONS USED:  NONE  SPECIMEN:  Excision  DISPOSITION OF SPECIMEN:  PATHOLOGY  COUNTS:  YES  TOURNIQUET:   Total Tourniquet Time Documented: area (laterality) - 26 minutes Total: area (laterality) - 26 minutes   DICTATION: .Other Dictation: Dictation Number [id number]  PLAN OF CARE: Discharge to home after PACU  PATIENT DISPOSITION:  PACU - hemodynamically stable.

## 2016-10-28 NOTE — Anesthesia Procedure Notes (Signed)
Procedure Name: LMA Insertion Date/Time: 10/28/2016 12:03 PM Performed by: Toula Moos L Pre-anesthesia Checklist: Patient identified, Emergency Drugs available, Suction available, Patient being monitored and Timeout performed Patient Re-evaluated:Patient Re-evaluated prior to inductionOxygen Delivery Method: Circle system utilized Preoxygenation: Pre-oxygenation with 100% oxygen Intubation Type: IV induction Ventilation: Mask ventilation without difficulty LMA: LMA inserted LMA Size: 4.0 Number of attempts: 1 Airway Equipment and Method: Bite block Placement Confirmation: positive ETCO2 Tube secured with: Tape Dental Injury: Teeth and Oropharynx as per pre-operative assessment

## 2016-10-28 NOTE — Anesthesia Preprocedure Evaluation (Addendum)
Anesthesia Evaluation  Patient identified by MRN, date of birth, ID band Patient awake    Reviewed: Allergy & Precautions, NPO status , Patient's Chart, lab work & pertinent test results, reviewed documented beta blocker date and time   Airway Mallampati: II  TM Distance: >3 FB Neck ROM: Full    Dental no notable dental hx.    Pulmonary sleep apnea , pneumonia,    Pulmonary exam normal breath sounds clear to auscultation       Cardiovascular hypertension, Pt. on medications and Pt. on home beta blockers Normal cardiovascular exam Rhythm:Regular Rate:Normal     Neuro/Psych PSYCHIATRIC DISORDERS Anxiety Depression Bipolar Disorder negative neurological ROS  negative psych ROS   GI/Hepatic negative GI ROS, Neg liver ROS, GERD  ,  Endo/Other  negative endocrine ROSHypothyroidism   Renal/GU negative Renal ROS     Musculoskeletal  (+) Arthritis ,   Abdominal   Peds  Hematology negative hematology ROS (+)   Anesthesia Other Findings   Reproductive/Obstetrics negative OB ROS                             Anesthesia Physical Anesthesia Plan  ASA: II  Anesthesia Plan: General   Post-op Pain Management:    Induction: Intravenous  PONV Risk Score and Plan: 3 and Ondansetron, Dexamethasone, Propofol and Midazolam  Airway Management Planned: LMA  Additional Equipment:   Intra-op Plan:   Post-operative Plan: Extubation in OR  Informed Consent: I have reviewed the patients History and Physical, chart, labs and discussed the procedure including the risks, benefits and alternatives for the proposed anesthesia with the patient or authorized representative who has indicated his/her understanding and acceptance.   Dental advisory given  Plan Discussed with: CRNA  Anesthesia Plan Comments:        Anesthesia Quick Evaluation

## 2016-10-28 NOTE — Op Note (Signed)
Sarah Salazar, Sarah Salazar                ACCOUNT NO.:  [id number]  MEDICAL RECORD NO.:  [id number]  LOCATION:                                 FACILITY:  PHYSICIAN:  Daryll Brod, M.D.       DATE OF BIRTH:  1956/02/07  DATE OF PROCEDURE:  10/28/2016 DATE OF DISCHARGE:                              OPERATIVE REPORT   PREOPERATIVE DIAGNOSES:  Carpal tunnel syndrome, left hand with dorsal wrist ganglion, left wrist.  POSTOPERATIVE DIAGNOSES:  Carpal tunnel syndrome, left hand with dorsal wrist ganglion, left wrist.  OPERATION:  Decompression of left median nerve with excision of dorsal wrist ganglion, left wrist.  SURGEON:  Daryll Brod, M.D.  ASSISTANT:  None.  ANESTHESIA:  General.  PLACE OF SURGERY:  [facility].  ANESTHESIOLOGIST:  [place].  HISTORY:  The patient is a 61 year old female with a history of carpal tunnel syndrome, nerve conduction is positive, which has not responded to conservative treatment.  She has also a dorsal cyst, she is admitted for excision of the cyst, release of the median nerve.  Preoperative, perioperative, and postoperative courses have been discussed along with risks and complications.  She is aware that there is no guarantee to the surgery; the possibility of infection; recurrence of injury to arteries, nerves, tendons; incomplete relief of symptoms; and dystrophy.  In the preoperative area, the patient was seen, the extremity marked by both patient and surgeon.  Antibiotic given.  PROCEDURE IN DETAIL:  The patient was brought to the operating room, where a general anesthetic was carried out without difficulty under the direction of the Anesthesia Department.  She was prepped using ChloraPrep in supine position with left arm free.  A 3-minute dry time was allowed.  Time-out taken, confirming the patient and procedure.  The limb was exsanguinated with an Esmarch bandage.  Tourniquet placed high on the arm, was inflated to 250 mmHg.  The  carpal tunnel was approached first.  A longitudinal incision was made in the left palm, carried down through subcutaneous tissue.  Bleeders were electrocauterized.  The palmar fascia was split.  The superficial palmar arch was identified. The flexor tendon to the ring and little finger was identified. Retractors were placed retracting the median nerve radially and the ulnar nerve ulnarly.  The flexor retinaculum was then incised on its ulnar border with sharp dissection.  Right angle and Sewall retractor were placed between skin and forearm fascia.  The fascia was dissected free from the overlying skin and subcutaneous tissue and from the deep tissues.  With blunt scissors, the proximal palmar fascia, distal forearm fascia was then released for approximately 2 cm proximal to the wrist crease under direct vision.  The canal was explored. The area of compression to the nerve was apparent.  Motor branch entered into muscle distally.  The wound was copiously irrigated with saline and closed with interrupted 4-0 nylon sutures.  Separate incision was then made on the dorsal wrist.  This was transverse in nature.  Carried down through subcutaneous tissue.  Bleeders were electrocauterized with bipolar. Neuro structures were identified and protected.  The extensor retinaculum was split.  A large cyst was immediately  encountered.  This was multilobulated.  With blunt and sharp dissection, was dissected free, followed down into the wrist joint.  The joint was opened, debrided with a rongeur.  The cyst was sent to Pathology.  The wound was irrigated.  The capsule was then closed with figure-of-eight 4-0 Vicryl sutures.  The subcutaneous tissue was closed with interrupted 4-0 Vicryl and skin with a subcuticular 4-0 Monocryl suture. The extensor retinaculum was also repaired with interrupted 4-0 Vicryl sutures.  A compressive dressing and volar splint was applied.  On deflation of the tourniquet, all  fingers immediately pinked.  She was taken to the recovery room for observation in satisfactory condition.  She will be discharged to home to return to [place] in 1 week, on tramadol.          ______________________________ Daryll Brod, M.D.     GK/MEDQ  D:  10/28/2016  T:  10/28/2016  Job:  [id number]

## 2016-10-28 NOTE — H&P (Signed)
Sarah Salazar is an 61 y.o. female.   Chief Complaint: mass left wrist and numbness fingers HPI: Sarah Salazar is a 61 year old, right-handed. She has undergone multiple procedures by myself in the past, including stabilization right CMC joint, TFCC repair, reconstruction, carpal tunnel release on her right side. On her left side, lateral epicondylitis has been repaired. She comes in today complaining of a mass in the dorsal aspect of the left wrist, pain in that side, sharp in nature, with VAS score of 8/10 with lifting; otherwise, it is 1 to 2/10. She is complaining of numbness and tingling on that side also. She has taken Advil for it. She is also complaining of a feeling of tiredness if she crochets or pinches on each side. She states that she will take Advil two 3-4 times, up to 6 times a day with some relief. She has no history of injury. She is not awakened at night. She has a history of thyroid problems, arthritis. There is no history of diabetes or gout. She states the pain on her left wrist will sometimes radiate distally, especially with dorsiflexion. She has a history of carpal tunnel syndrome nerve conductions positive on her left side. These nerve conductions were done by Dr. Thereasa Parkin a year ago. This reveals a carpal tunnel syndrome being present along with a mass on the dorsal aspect of her left wrist. This was seen on ultrasound done by Dr. Thereasa Parkin         Past Medical History:  Diagnosis Date  . Anxiety   . Arthritis    both thumbs  . Bipolar disorder ([place])   . Bulging lumbar disc    "L3-5" (08/01/2013)  . Chest pain    Emergency room April 23, 2012, troponin is normal, chest CT scan showed no pulmonary embolus  . Complication of anesthesia    "I'm allergic to versed and fentanyl"  . Cough    Chronic cough  . Depression   . Family history of anesthesia complication    "hard time waking daughter up post SVT ablation"  . GERD (gastroesophageal reflux disease)   .  Hypertension   . Hypothyroidism    hypothyroidism  . IBS (irritable bowel syndrome)   . Personal history of colonic adenoma 06/28/2012  . Personal history of failed moderate sedation 06/20/2012  . Pneumonia    "couple times" (08/01/2013)  . Sleep apnea    does not use CPAP    Past Surgical History:  Procedure Laterality Date  . BREAST LUMPECTOMY Left    "1st breast OR"  . CHOLECYSTECTOMY    . COLONOSCOPY  2002  . CYSTOCELE REPAIR  01/13/2009  . DILATION AND CURETTAGE OF UTERUS  X 3  . ELBOW SURGERY Left 2005  . ESOPHAGOGASTRODUODENOSCOPY (EGD) WITH ESOPHAGEAL DILATION  2004; 2014  . KNEE ARTHROSCOPY Right X 2  . LAPAROSCOPY  1970's - 06/03/1990   "several; to evaluate dysfunctional menses & pelvic pain"  . MASTECTOMY, PARTIAL Left    "2nd breast OR", benign fibrocystic changes  . WISDOM TOOTH EXTRACTION     "all 4 at once"  . WRIST SURGERY  1979-~ 2010   X 4    Family History  Problem Relation Age of Onset  . Coronary artery disease Mother   . Stroke Mother        TIA  . Uterine cancer Mother        BREAST  . Thyroid disease Mother        goiter  .  Lung cancer Father   . Heart disease Father   . COPD Father   . Coronary artery disease Father   . Colon cancer Paternal Uncle        COLON; STOMACH  . Lung cancer Maternal Grandfather   . Rheum arthritis Maternal Aunt   . Supraventricular tachycardia Daughter        S/P ablation  . Thyroid disease Cousin    Social History:  reports that she has never smoked. She has never used smokeless tobacco. She reports that she does not drink alcohol or use drugs.  Allergies:  Allergies  Allergen Reactions  . Amoxicillin Rash       . Fentanyl Rash    given with Versed  . Midazolam Rash    given with Fentanyl  . Oxycodone-Aspirin Rash  . Sulfonamide Derivatives Rash    rash  . Hydrocodone Hives and Nausea And Vomiting  . Nalbuphine Nausea And Vomiting  . Oxycodone-Acetaminophen Hives and Nausea And Vomiting  .  Venlafaxine Other (See Comments)    Patient says makes her crazy  . Verapamil Palpitations    Caused "pounding " in chest    No prescriptions prior to admission.    No results found for this or any previous visit (from the past 48 hour(s)).  No results found.   Pertinent items are noted in HPI.  Height 5\' 5"  (1.651 m), weight 85.7 kg (189 lb).  General appearance: alert, cooperative and appears stated age Head: Normocephalic, without obvious abnormality Neck: no JVD Resp: clear to auscultation bilaterally Cardio: regular rate and rhythm, S1, S2 normal, no murmur, click, rub or gallop GI: soft, non-tender; bowel sounds normal; no masses,  no organomegaly Extremities:  mass left wrist and numbness fingers Pulses: 2+ and symmetric Skin: Skin color, texture, turgor normal. No rashes or lesions Neurologic: Grossly normal Incision/Wound: na  Assessment/Plan Diagnosis dorsal wrist ganglion left wrist carpal tunnel syndrome left hand.     Plan: She would like to proceed to have the carpal tunnel cyst removed. Pre-peri-and postoperative course are discussed along with risk complications. She is where there is no guarantee to the surgery the possibility of infection recurrence injury to arteries nerves tendons complete relief symptoms and dystrophy. She is scheduled for carpal tunnel release left hand excision dorsal wrist ganglion as an outpatient under regional anesthesia. Questions are encouraged and answered to her satisfaction.      Maly Lemarr R 10/28/2016, 5:29 AM

## 2016-10-28 NOTE — Discharge Instructions (Addendum)

## 2016-10-28 NOTE — Transfer of Care (Signed)
Immediate Anesthesia Transfer of Care Note  Patient: Sarah Salazar  Procedure(s) Performed: Procedure(s): CARPAL TUNNEL RELEASE (Left) EXCISION GANGLION CYST LEFT DORSAL WRIST (Left)  Patient Location: PACU  Anesthesia Type:General  Level of Consciousness: awake and sedated  Airway & Oxygen Therapy: Patient Spontanous Breathing and Patient connected to face mask oxygen  Post-op Assessment: Report given to RN and Post -op Vital signs reviewed and stable  Post vital signs: Reviewed and stable  Last Vitals:  Vitals:   10/28/16 1025  BP: (!) 158/95  Pulse: (!) 54  Resp: 20  Temp: 36.7 C    Last Pain:  Vitals:   10/28/16 1025  TempSrc: Oral         Complications: No apparent anesthesia complications

## 2016-10-29 ENCOUNTER — Encounter (HOSPITAL_BASED_OUTPATIENT_CLINIC_OR_DEPARTMENT_OTHER): Payer: Self-pay | Admitting: Orthopedic Surgery

## 2016-10-29 NOTE — Addendum Note (Signed)
Addendum  created 10/29/16 1353 by Nolon Nations, MD   SmartForm saved

## 2016-12-06 ENCOUNTER — Ambulatory Visit (HOSPITAL_COMMUNITY): Payer: BLUE CROSS/BLUE SHIELD | Admitting: Psychiatry

## 2017-01-09 ENCOUNTER — Other Ambulatory Visit: Payer: Self-pay | Admitting: Family Medicine

## 2017-01-10 NOTE — Telephone Encounter (Signed)
Patient must sched appt first/not been seen since March/thx dmf

## 2017-01-11 ENCOUNTER — Other Ambulatory Visit: Payer: Self-pay

## 2017-01-11 MED ORDER — METOPROLOL TARTRATE 25 MG PO TABS: ORAL_TABLET | ORAL | 0 refills | Status: DC

## 2017-01-11 NOTE — Telephone Encounter (Signed)
Pt called in to follow up on refill request, advised pt that an office visit is needed. She said that she is currently living at [facility] and is working on getting set up with a provider in the area for while shes there. She do plan to continue to see Dr. Etter Sjogren when she's here. Verbalized to PCP, CMA Jasmine M. Sent in to pharmacy (30 day supply)    CB: [phone]  Pharmacy: [place], [place]

## 2017-02-09 ENCOUNTER — Telehealth: Payer: Self-pay | Admitting: Family Medicine

## 2017-02-09 NOTE — Telephone Encounter (Signed)
°  Relation to KF:EXMD Call back [phone] Pharmacy: [facility] [place], [place] AT [address] [phone] (Phone) [phone] (Fax)     Reason for call:  Patient requesting a 90 day supply of all of her medications due to patient being out of town, please advise

## 2017-02-14 NOTE — Telephone Encounter (Signed)
Called the patient to inquire which medications she needed [place] to fill for her Called left a message to call back.

## 2017-04-04 ENCOUNTER — Other Ambulatory Visit: Payer: Self-pay | Admitting: Family Medicine

## 2017-06-14 ENCOUNTER — Encounter: Payer: Self-pay | Admitting: Internal Medicine

## 2017-07-05 DIAGNOSIS — F330 Major depressive disorder, recurrent, mild: Secondary | ICD-10-CM | POA: Diagnosis not present

## 2017-07-11 DIAGNOSIS — F330 Major depressive disorder, recurrent, mild: Secondary | ICD-10-CM | POA: Diagnosis not present

## 2017-07-19 DIAGNOSIS — F330 Major depressive disorder, recurrent, mild: Secondary | ICD-10-CM | POA: Diagnosis not present

## 2017-07-26 DIAGNOSIS — F330 Major depressive disorder, recurrent, mild: Secondary | ICD-10-CM | POA: Diagnosis not present

## 2017-08-02 DIAGNOSIS — F330 Major depressive disorder, recurrent, mild: Secondary | ICD-10-CM | POA: Diagnosis not present

## 2017-08-03 DIAGNOSIS — R05 Cough: Secondary | ICD-10-CM | POA: Diagnosis not present

## 2017-08-03 DIAGNOSIS — J209 Acute bronchitis, unspecified: Secondary | ICD-10-CM | POA: Diagnosis not present

## 2017-08-10 DIAGNOSIS — F330 Major depressive disorder, recurrent, mild: Secondary | ICD-10-CM | POA: Diagnosis not present

## 2017-08-25 DIAGNOSIS — J309 Allergic rhinitis, unspecified: Secondary | ICD-10-CM | POA: Diagnosis not present

## 2017-08-25 DIAGNOSIS — I10 Essential (primary) hypertension: Secondary | ICD-10-CM | POA: Diagnosis not present

## 2017-08-30 DIAGNOSIS — F330 Major depressive disorder, recurrent, mild: Secondary | ICD-10-CM | POA: Diagnosis not present

## 2017-09-07 DIAGNOSIS — F330 Major depressive disorder, recurrent, mild: Secondary | ICD-10-CM | POA: Diagnosis not present

## 2017-09-08 DIAGNOSIS — J324 Chronic pansinusitis: Secondary | ICD-10-CM | POA: Diagnosis not present

## 2017-09-08 DIAGNOSIS — R05 Cough: Secondary | ICD-10-CM | POA: Diagnosis not present

## 2017-09-08 DIAGNOSIS — R1110 Vomiting, unspecified: Secondary | ICD-10-CM | POA: Diagnosis not present

## 2017-09-08 DIAGNOSIS — I10 Essential (primary) hypertension: Secondary | ICD-10-CM | POA: Diagnosis not present

## 2017-09-17 ENCOUNTER — Encounter: Payer: Self-pay | Admitting: Family Medicine

## 2017-09-17 DIAGNOSIS — G4489 Other headache syndrome: Secondary | ICD-10-CM | POA: Diagnosis not present

## 2017-09-17 DIAGNOSIS — R000 Tachycardia, unspecified: Secondary | ICD-10-CM | POA: Diagnosis not present

## 2017-09-17 DIAGNOSIS — Z7982 Long term (current) use of aspirin: Secondary | ICD-10-CM | POA: Diagnosis not present

## 2017-09-17 DIAGNOSIS — R42 Dizziness and giddiness: Secondary | ICD-10-CM | POA: Diagnosis not present

## 2017-09-17 DIAGNOSIS — R29810 Facial weakness: Secondary | ICD-10-CM | POA: Diagnosis not present

## 2017-09-17 DIAGNOSIS — R200 Anesthesia of skin: Secondary | ICD-10-CM | POA: Diagnosis not present

## 2017-09-17 DIAGNOSIS — K219 Gastro-esophageal reflux disease without esophagitis: Secondary | ICD-10-CM | POA: Diagnosis not present

## 2017-09-17 DIAGNOSIS — G459 Transient cerebral ischemic attack, unspecified: Secondary | ICD-10-CM | POA: Diagnosis not present

## 2017-09-17 DIAGNOSIS — R0781 Pleurodynia: Secondary | ICD-10-CM | POA: Diagnosis not present

## 2017-09-17 DIAGNOSIS — I10 Essential (primary) hypertension: Secondary | ICD-10-CM | POA: Diagnosis not present

## 2017-09-17 DIAGNOSIS — R0602 Shortness of breath: Secondary | ICD-10-CM | POA: Diagnosis not present

## 2017-09-17 DIAGNOSIS — Z79890 Hormone replacement therapy (postmenopausal): Secondary | ICD-10-CM | POA: Diagnosis not present

## 2017-09-17 DIAGNOSIS — R05 Cough: Secondary | ICD-10-CM | POA: Diagnosis not present

## 2017-09-17 DIAGNOSIS — I6523 Occlusion and stenosis of bilateral carotid arteries: Secondary | ICD-10-CM | POA: Diagnosis not present

## 2017-09-17 DIAGNOSIS — E039 Hypothyroidism, unspecified: Secondary | ICD-10-CM | POA: Diagnosis not present

## 2017-09-18 DIAGNOSIS — R200 Anesthesia of skin: Secondary | ICD-10-CM | POA: Diagnosis not present

## 2017-09-18 DIAGNOSIS — K219 Gastro-esophageal reflux disease without esophagitis: Secondary | ICD-10-CM | POA: Diagnosis not present

## 2017-09-18 DIAGNOSIS — I10 Essential (primary) hypertension: Secondary | ICD-10-CM | POA: Diagnosis not present

## 2017-09-18 DIAGNOSIS — G459 Transient cerebral ischemic attack, unspecified: Secondary | ICD-10-CM | POA: Diagnosis not present

## 2017-09-18 DIAGNOSIS — E039 Hypothyroidism, unspecified: Secondary | ICD-10-CM | POA: Diagnosis not present

## 2017-09-19 ENCOUNTER — Encounter: Payer: Self-pay | Admitting: Family Medicine

## 2017-09-19 DIAGNOSIS — E039 Hypothyroidism, unspecified: Secondary | ICD-10-CM | POA: Diagnosis not present

## 2017-09-19 DIAGNOSIS — G459 Transient cerebral ischemic attack, unspecified: Secondary | ICD-10-CM | POA: Diagnosis not present

## 2017-09-19 DIAGNOSIS — I10 Essential (primary) hypertension: Secondary | ICD-10-CM | POA: Diagnosis not present

## 2017-09-19 DIAGNOSIS — R200 Anesthesia of skin: Secondary | ICD-10-CM | POA: Diagnosis not present

## 2017-09-19 DIAGNOSIS — K219 Gastro-esophageal reflux disease without esophagitis: Secondary | ICD-10-CM | POA: Diagnosis not present

## 2017-09-19 DIAGNOSIS — R29810 Facial weakness: Secondary | ICD-10-CM | POA: Diagnosis not present

## 2017-09-20 DIAGNOSIS — G459 Transient cerebral ischemic attack, unspecified: Secondary | ICD-10-CM | POA: Diagnosis not present

## 2017-09-20 DIAGNOSIS — I10 Essential (primary) hypertension: Secondary | ICD-10-CM | POA: Diagnosis not present

## 2017-09-22 DIAGNOSIS — J320 Chronic maxillary sinusitis: Secondary | ICD-10-CM | POA: Diagnosis not present

## 2017-09-22 DIAGNOSIS — J321 Chronic frontal sinusitis: Secondary | ICD-10-CM | POA: Diagnosis not present

## 2017-09-22 DIAGNOSIS — J302 Other seasonal allergic rhinitis: Secondary | ICD-10-CM | POA: Diagnosis not present

## 2017-09-22 DIAGNOSIS — R05 Cough: Secondary | ICD-10-CM | POA: Diagnosis not present

## 2017-09-27 DIAGNOSIS — R05 Cough: Secondary | ICD-10-CM | POA: Diagnosis not present

## 2017-09-27 DIAGNOSIS — F5102 Adjustment insomnia: Secondary | ICD-10-CM | POA: Diagnosis not present

## 2017-09-27 DIAGNOSIS — I10 Essential (primary) hypertension: Secondary | ICD-10-CM | POA: Diagnosis not present

## 2017-09-27 DIAGNOSIS — K210 Gastro-esophageal reflux disease with esophagitis: Secondary | ICD-10-CM | POA: Diagnosis not present

## 2017-09-28 DIAGNOSIS — I10 Essential (primary) hypertension: Secondary | ICD-10-CM | POA: Diagnosis not present

## 2017-09-28 DIAGNOSIS — E785 Hyperlipidemia, unspecified: Secondary | ICD-10-CM | POA: Diagnosis not present

## 2017-09-28 DIAGNOSIS — K219 Gastro-esophageal reflux disease without esophagitis: Secondary | ICD-10-CM | POA: Diagnosis not present

## 2017-09-28 DIAGNOSIS — G459 Transient cerebral ischemic attack, unspecified: Secondary | ICD-10-CM | POA: Diagnosis not present

## 2017-10-04 DIAGNOSIS — E6609 Other obesity due to excess calories: Secondary | ICD-10-CM | POA: Diagnosis not present

## 2017-10-12 DIAGNOSIS — D519 Vitamin B12 deficiency anemia, unspecified: Secondary | ICD-10-CM | POA: Diagnosis not present

## 2017-10-12 DIAGNOSIS — E039 Hypothyroidism, unspecified: Secondary | ICD-10-CM | POA: Diagnosis not present

## 2017-10-12 DIAGNOSIS — Z0182 Encounter for allergy testing: Secondary | ICD-10-CM | POA: Diagnosis not present

## 2017-10-12 DIAGNOSIS — I10 Essential (primary) hypertension: Secondary | ICD-10-CM | POA: Diagnosis not present

## 2017-10-12 DIAGNOSIS — D649 Anemia, unspecified: Secondary | ICD-10-CM | POA: Diagnosis not present

## 2017-10-12 DIAGNOSIS — E1165 Type 2 diabetes mellitus with hyperglycemia: Secondary | ICD-10-CM | POA: Diagnosis not present

## 2017-10-12 DIAGNOSIS — E6609 Other obesity due to excess calories: Secondary | ICD-10-CM | POA: Diagnosis not present

## 2017-10-12 DIAGNOSIS — G459 Transient cerebral ischemic attack, unspecified: Secondary | ICD-10-CM | POA: Diagnosis not present

## 2017-10-12 DIAGNOSIS — E785 Hyperlipidemia, unspecified: Secondary | ICD-10-CM | POA: Diagnosis not present

## 2017-10-12 DIAGNOSIS — E038 Other specified hypothyroidism: Secondary | ICD-10-CM | POA: Diagnosis not present

## 2017-10-13 DIAGNOSIS — F330 Major depressive disorder, recurrent, mild: Secondary | ICD-10-CM | POA: Diagnosis not present

## 2017-10-15 DIAGNOSIS — H539 Unspecified visual disturbance: Secondary | ICD-10-CM | POA: Diagnosis not present

## 2017-10-15 DIAGNOSIS — Z79899 Other long term (current) drug therapy: Secondary | ICD-10-CM | POA: Diagnosis not present

## 2017-10-15 DIAGNOSIS — F319 Bipolar disorder, unspecified: Secondary | ICD-10-CM | POA: Diagnosis not present

## 2017-10-15 DIAGNOSIS — D649 Anemia, unspecified: Secondary | ICD-10-CM | POA: Diagnosis not present

## 2017-10-15 DIAGNOSIS — E876 Hypokalemia: Secondary | ICD-10-CM | POA: Diagnosis not present

## 2017-10-15 DIAGNOSIS — E039 Hypothyroidism, unspecified: Secondary | ICD-10-CM | POA: Diagnosis not present

## 2017-10-15 DIAGNOSIS — Z8673 Personal history of transient ischemic attack (TIA), and cerebral infarction without residual deficits: Secondary | ICD-10-CM | POA: Diagnosis not present

## 2017-10-15 DIAGNOSIS — E785 Hyperlipidemia, unspecified: Secondary | ICD-10-CM | POA: Diagnosis not present

## 2017-10-15 DIAGNOSIS — I10 Essential (primary) hypertension: Secondary | ICD-10-CM | POA: Diagnosis not present

## 2017-10-15 DIAGNOSIS — R402441 Other coma, without documented Glasgow coma scale score, or with partial score reported, in the field [EMT or ambulance]: Secondary | ICD-10-CM | POA: Diagnosis not present

## 2017-10-15 DIAGNOSIS — G44201 Tension-type headache, unspecified, intractable: Secondary | ICD-10-CM | POA: Diagnosis not present

## 2017-10-15 DIAGNOSIS — Z882 Allergy status to sulfonamides status: Secondary | ICD-10-CM | POA: Diagnosis not present

## 2017-10-15 DIAGNOSIS — R51 Headache: Secondary | ICD-10-CM | POA: Diagnosis not present

## 2017-10-15 DIAGNOSIS — R52 Pain, unspecified: Secondary | ICD-10-CM | POA: Diagnosis not present

## 2017-10-15 DIAGNOSIS — G4489 Other headache syndrome: Secondary | ICD-10-CM | POA: Diagnosis not present

## 2017-10-15 DIAGNOSIS — Z635 Disruption of family by separation and divorce: Secondary | ICD-10-CM | POA: Diagnosis not present

## 2017-10-15 DIAGNOSIS — G44209 Tension-type headache, unspecified, not intractable: Secondary | ICD-10-CM | POA: Diagnosis not present

## 2017-10-18 ENCOUNTER — Encounter (HOSPITAL_COMMUNITY): Payer: Self-pay | Admitting: Emergency Medicine

## 2017-10-18 ENCOUNTER — Ambulatory Visit: Payer: BLUE CROSS/BLUE SHIELD | Admitting: Family Medicine

## 2017-10-18 ENCOUNTER — Encounter: Payer: Self-pay | Admitting: Family Medicine

## 2017-10-18 ENCOUNTER — Emergency Department (HOSPITAL_COMMUNITY)
Admission: EM | Admit: 2017-10-18 | Discharge: 2017-10-19 | Disposition: A | Discharge status: Home / Self Care | Payer: BLUE CROSS/BLUE SHIELD | Attending: Emergency Medicine | Admitting: Emergency Medicine

## 2017-10-18 ENCOUNTER — Other Ambulatory Visit: Payer: Self-pay

## 2017-10-18 VITALS — BP 142/59 | HR 70 | Temp 98.2°F | Resp 16 | Ht 66.0 in | Wt 181.2 lb

## 2017-10-18 DIAGNOSIS — F331 Major depressive disorder, recurrent, moderate: Secondary | ICD-10-CM

## 2017-10-18 DIAGNOSIS — E039 Hypothyroidism, unspecified: Secondary | ICD-10-CM | POA: Diagnosis not present

## 2017-10-18 DIAGNOSIS — I10 Essential (primary) hypertension: Secondary | ICD-10-CM | POA: Diagnosis not present

## 2017-10-18 DIAGNOSIS — Z7982 Long term (current) use of aspirin: Secondary | ICD-10-CM | POA: Diagnosis not present

## 2017-10-18 DIAGNOSIS — Z8673 Personal history of transient ischemic attack (TIA), and cerebral infarction without residual deficits: Secondary | ICD-10-CM

## 2017-10-18 DIAGNOSIS — F3131 Bipolar disorder, current episode depressed, mild: Secondary | ICD-10-CM | POA: Insufficient documentation

## 2017-10-18 DIAGNOSIS — F319 Bipolar disorder, unspecified: Secondary | ICD-10-CM | POA: Diagnosis not present

## 2017-10-18 DIAGNOSIS — F314 Bipolar disorder, current episode depressed, severe, without psychotic features: Secondary | ICD-10-CM | POA: Diagnosis not present

## 2017-10-18 DIAGNOSIS — Z79899 Other long term (current) drug therapy: Secondary | ICD-10-CM | POA: Diagnosis not present

## 2017-10-18 LAB — CBC WITH DIFFERENTIAL/PLATELET
Basophils Absolute: 0 10*3/uL (ref 0.0–0.1)
Basophils Relative: 0 %
Eosinophils Absolute: 0.2 10*3/uL (ref 0.0–0.7)
Eosinophils Relative: 3 %
HCT: 37 % (ref 36.0–46.0)
Hemoglobin: 12.2 g/dL (ref 12.0–15.0)
Lymphocytes Relative: 27 %
Lymphs Abs: 2.2 10*3/uL (ref 0.7–4.0)
MCH: 31.3 pg (ref 26.0–34.0)
MCHC: 33 g/dL (ref 30.0–36.0)
MCV: 94.9 fL (ref 78.0–100.0)
Monocytes Absolute: 0.7 10*3/uL (ref 0.1–1.0)
Monocytes Relative: 8 %
Neutro Abs: 4.9 10*3/uL (ref 1.7–7.7)
Neutrophils Relative %: 62 %
Platelets: 195 10*3/uL (ref 150–400)
RBC: 3.9 MIL/uL (ref 3.87–5.11)
RDW: 13.6 % (ref 11.5–15.5)
WBC: 8 10*3/uL (ref 4.0–10.5)

## 2017-10-18 MED ORDER — LURASIDONE HCL 60 MG PO TABS: 1.0000 | ORAL_TABLET | Freq: Every day | ORAL | 2 refills | Status: DC

## 2017-10-18 MED ORDER — METOPROLOL TARTRATE 25 MG PO TABS
25.0000 mg | ORAL_TABLET | Freq: Two times a day (BID) | ORAL | Status: DC
  Administered 2017-10-18 – 2017-10-19 (×2): 25 mg via ORAL
  Filled 2017-10-18 (×2): qty 1

## 2017-10-18 MED ORDER — AMLODIPINE BESYLATE 5 MG PO TABS
5.0000 mg | ORAL_TABLET | Freq: Every day | ORAL | Status: DC
  Administered 2017-10-19: 5 mg via ORAL
  Filled 2017-10-18: qty 1

## 2017-10-18 MED ORDER — THYROID 60 MG PO TABS
90.0000 mg | ORAL_TABLET | Freq: Every day | ORAL | Status: DC
  Administered 2017-10-19: 90 mg via ORAL
  Filled 2017-10-18: qty 1

## 2017-10-18 MED ORDER — PANTOPRAZOLE SODIUM 40 MG PO TBEC: 40.0000 mg | DELAYED_RELEASE_TABLET | Freq: Every day | ORAL | Status: DC

## 2017-10-18 MED ORDER — LURASIDONE HCL 20 MG PO TABS
60.0000 mg | ORAL_TABLET | Freq: Every day | ORAL | Status: DC
  Administered 2017-10-19: 60 mg via ORAL
  Filled 2017-10-18: qty 3

## 2017-10-18 NOTE — ED Notes (Signed)
Bed: WA31 Expected date:  Expected time:  Means of arrival:  Comments: 

## 2017-10-18 NOTE — Patient Instructions (Signed)
Living With Bipolar Disorder If you have been diagnosed with bipolar disorder, you may be relieved that you now know why you have felt or behaved a certain way. You may also feel overwhelmed about the treatment ahead, how to get the support you need, and how to deal with the condition day-to-day. With care and support, you can learn to manage your symptoms and live with bipolar disorder. How to manage lifestyle changes Managing stress Stress is your body's reaction to life changes and events, both good and bad. Stress can play a major role in bipolar disorder, so it is important to learn how to cope with stress. Some techniques to cope with stress include:  Meditation, muscle relaxation, and breathing exercises.  Exercise. Even a short daily walk can help to lower stress levels.  Getting enough good-quality sleep. Too little sleep can cause mania to start (can trigger mania).  Making a schedule to manage your time. Knowing your daily schedule can help to keep you from feeling overwhelmed by tasks and deadlines.  Spending time on hobbies that you enjoy.  Medicines Your health care provider may suggest certain medicines if he or she feels that they will help improve your condition. Avoid using caffeine, alcohol, and other substances that may prevent your medicines from working properly (may interact). It is also important to:  Talk with your pharmacist or health care provider about all the medicines that you take, their possible side effects, and which medicines are safe to take together.  Make it your goal to take part in all treatment decisions (shared decision-making). Ask about possible side effects of medicines that your health care provider recommends, and tell him or her how you feel about having those side effects. It is best if shared decision-making with your health care provider is part of your total treatment plan.  If you are taking medicines as part of your treatment, do not stop  taking medicines before you ask your health care provider if it is safe to stop. You may need to have the medicine slowly decreased (tapered) over time to decrease the risk of harmful side effects. Relationships Spend time with people that you trust and with whom you feel a sense of understanding and calm. Try to find friends or family members who make you feel safe and can help you control feelings of mania. Consider going to couples counseling, family education classes, or family therapy to:  Educate your loved ones about your condition and offer suggestions about how they can support you.  Help resolve conflicts.  Help develop communication skills in your relationships.  How to recognize changes in your condition Everyone responds differently to treatment for bipolar disorder. Some signs that your condition is improving include:  Leveling of your mood. You may have less anger and excitement about daily activities, and your low moods may not be as bad.  Your symptoms being less intense.  Feeling calm more often.  Thinking clearly.  Not experiencing consequences for extreme behavior.  Feeling like your life is settling down.  Your behavior seeming more normal to you and to other people.  Some signs that your condition may be getting worse include:  Sleep problems.  Moods cycling between deep lows and unusually high (excess) energy.  Extreme emotions.  More anger at loved ones.  Staying away from others (isolating yourself).  A feeling of power or superiority.  Completing a lot of tasks in a very short amount of time.  Unusual thoughts and behaviors.  Suicidal thoughts.  Where to find support Talking to others  Try making a list of the people you may want to tell about your condition, such as the people you trust most.  Plan what you are willing to talk about and what you do not want to discuss. Think about your needs ahead of time, and how your friends and family  members can support you.  Let your loved ones know when they can share advice and when you would just like them to listen.  Give your loved ones information about bipolar disorder, and encourage them to learn about the condition. Finances Not all insurance plans cover mental health care, so it is important to check with your insurance carrier. If paying for co-pays or counseling services is a problem, search for a local or county mental health care center. Public mental health care services may be offered there at a low cost or no cost when you are not able to see a private health care provider. If you are taking medicine for depression, you may be able to get the generic form, which may be less expensive than brand-name medicine. Some makers of prescription medicines also offer help to patients who cannot afford the medicines they need. Follow these instructions at home: Medicines  Take over-the-counter and prescription medicines only as told by your health care provider or pharmacist.  Ask your pharmacist what over-the-counter cold medicines you should avoid. Some medicines can make symptoms worse. General instructions  Ask for support from trusted family members or friends to make sure you stay on track with your treatment.  Keep a journal to write down your daily moods, medicines, sleep habits, and life events. This may help you have more success with your treatment.  Make and follow a routine for daily meal times. Eat healthy foods, such as whole grains, vegetables, and fresh fruit.  Try to go to sleep and wake up around the same time every day.  Keep all follow-up visits as told by your health care provider. This is important. Questions to ask your health care provider:  If you are taking medicines: ? How long do I need to take medicine? ? Are there any long-term side effects of my medicine? ? Are there any alternatives to taking medicine?  How would I benefit from  therapy?  How often should I follow up with a health care provider? Contact a health care provider if:  Your symptoms get worse or they do not get better with treatment. Get help right away if:  You have thoughts about harming yourself or others. If you ever feel like you may hurt yourself or others, or have thoughts about taking your own life, get help right away. You can go to your nearest emergency department or call:  Your local emergency services (911 in the [place]  A suicide crisis helpline, such as the [place] at 407-299-3850. This is open 24-hours a day.  Summary  Learning ways to deal with stress can help to calm you and may also help your treatment work better.  There is a wide range of medicines that can help to treat bipolar disorder.  Having healthy relationships can help to make your moods more stable.  Contact a health care provider if your symptoms get worse or they do not get better with treatment. This information is not intended to replace advice given to you by your health care provider. Make sure you discuss any questions you have with your  health care provider. Document Released: 08/19/2016 Document Revised: 08/19/2016 Document Reviewed: 08/19/2016 Elsevier Interactive Patient Education  2018 [employer]  

## 2017-10-18 NOTE — ED Triage Notes (Signed)
Pt from home with sister following a visit to her PCP. Pt states she was being treated for bipolar disorder 2 years ago when her PCP retired. Pt states her new doctor did not believe she had bipolar and took her off her medication. Pt states she has had a difficult time managing her cycling moods since then. Pt states the past 4 weeks she has had increased anxiety over situations that would not normally cause her stress. Pt states this situation has caused her passive SI which she describes as sometimes "wanting to get off the merry go round, but I would never actually hurt myself". Pt states she saw a primary doctor today who put her back on [place] but she is having difficulty getting it filled (insurance has to call pharmacy to notify them if it will be covered). Pt states primary reason for visit is to get referrals for therapists and to get put back on medication.

## 2017-10-18 NOTE — ED Notes (Signed)
Pt stated "I was on Latuda @ 1 time and it worked.  I got a Rx but I can't get it filled until the insurance approves it.  I live @ [facility] now but come up here because of my father.  My boyfriend didn't know I was bipolar and my family thought if I came up here they could do things that would make me feel better about myself.  They said will get your haircut into a bob.  I said I don't want a bob.  I just need to get away.  I think I need to be an inpatient."

## 2017-10-18 NOTE — ED Notes (Signed)
TTS assessment in progress. 

## 2017-10-18 NOTE — ED Provider Notes (Signed)
[facility] DEPT Provider Note   CSN: [id number] Arrival date & time: 10/18/17  2011     History   Chief Complaint Chief Complaint  Patient presents with  . Medical Clearance  . Medication Refill    HPI Sarah Salazar is a 62 y.o. female.  62 year old female presents to the emergency department with her sister.  She reports a history of bipolar disorder for which she was previously prescribed Latuda.  Her primary care doctor retired and she has been off of this medication for the past 2 years.  She states that other doctor she went to did not believe she had bipolar which is why she was taken off this medication.  She started a new job recently which is more stressful.  This has caused worsening anxiety as well as agitation.  Increased stress has caused passive suicidal ideations which patient describes as sometimes "wanting to get off the merry-go-round, but I would never actually hurt myself".  She did see a primary care doctor today who prescribed Latuda.  She has been unable to obtain this medication as it must first be cleared by her insurance company.  She states that she does not feel that she can wait to receive this medication.  She is requesting psychiatric evaluation and is hopeful for admission for psychiatric stabilization.     Past Medical History:  Diagnosis Date  . Anxiety   . Arthritis    both thumbs  . Bipolar disorder ([place])   . Bulging lumbar disc    "L3-5" (08/01/2013)  . Chest pain    Emergency room April 23, 2012, troponin is normal, chest CT scan showed no pulmonary embolus  . Complication of anesthesia    "I'm allergic to versed and fentanyl"  . Cough    Chronic cough  . Depression   . Family history of anesthesia complication    "hard time waking daughter up post SVT ablation"  . GERD (gastroesophageal reflux disease)   . Hypertension   . Hypothyroidism    hypothyroidism  . IBS (irritable bowel syndrome)   .  Personal history of colonic adenoma 06/28/2012  . Personal history of failed moderate sedation 06/20/2012  . Pneumonia    "couple times" (08/01/2013)  . Sleep apnea    does not use CPAP  . Stroke [facility])    tia    Patient Active Problem List   Diagnosis Date Noted  . Diarrhea 07/24/2016  . Functional dyspepsia 06/28/2016  . OSA (obstructive sleep apnea) 10/09/2014  . Vocal cord dysfunction 09/04/2014  . Syncope 08/01/2013  . Palpitations 05/11/2013  . Obesity (BMI 30-39.9) 04/11/2013  . Personal history of colonic adenoma 06/28/2012  . Personal history of failed moderate sedation 06/20/2012  . [name] 06/19/2012  . Ejection fraction   . HTN (hypertension) 05/04/2012  . IBS (irritable bowel syndrome)   . Anxiety   . Depression   . Hypothyroidism   . Migraine   . Chronic cough 09/10/2011  . Hallucinations 05/14/2010  . B12 DEFICIENCY 02/17/2010  . BIPOLAR DISORDER UNSPECIFIED 10/23/2009  . HYPOKALEMIA 05/20/2009  . ASTERIXIS 12/20/2006  . HYPERLIPIDEMIA NEC/NOS 10/27/2006  . FASTING HYPERGLYCEMIA 10/27/2006    Past Surgical History:  Procedure Laterality Date  . BREAST LUMPECTOMY Left    "1st breast OR"  . CARPAL TUNNEL RELEASE Left 10/28/2016   Procedure: CARPAL TUNNEL RELEASE;  Surgeon: Daryll Brod, MD;  Location: [facility];  Service: Orthopedics;  Laterality: Left;  . CHOLECYSTECTOMY    .  COLONOSCOPY  2002  . CYSTOCELE REPAIR  01/13/2009  . DILATION AND CURETTAGE OF UTERUS  X 3  . EAR CYST EXCISION Left 10/28/2016   Procedure: EXCISION GANGLION CYST LEFT DORSAL WRIST;  Surgeon: Daryll Brod, MD;  Location: [facility];  Service: Orthopedics;  Laterality: Left;  . ELBOW SURGERY Left 2005  . ESOPHAGOGASTRODUODENOSCOPY (EGD) WITH ESOPHAGEAL DILATION  2004; 2014  . KNEE ARTHROSCOPY Right X 2  . LAPAROSCOPY  1970's - 06/03/1990   "several; to evaluate dysfunctional menses & pelvic pain"  . MASTECTOMY, PARTIAL Left    "2nd breast OR", benign  fibrocystic changes  . WISDOM TOOTH EXTRACTION     "all 4 at once"  . WRIST SURGERY  1979-~ 2010   X 4     OB History   None      Home Medications    Prior to Admission medications   Medication Sig Start Date End Date Taking? Authorizing Provider  amLODipine (NORVASC) 5 MG tablet Take 1 tablet by mouth daily. 09/28/17  Yes [provider]  aspirin 325 MG tablet Take 1 tablet by mouth daily. 09/19/17  Yes [provider]  fluticasone (FLONASE) 50 MCG/ACT nasal spray Place 2 sprays into both nostrils daily. Patient taking differently: Place 2 sprays into both nostrils daily as needed for allergies.  05/24/16  Yes Saguier, Percell Miller, PA-C  metoprolol tartrate (LOPRESSOR) 25 MG tablet 1 tab po twice daily 01/11/17  Yes Lowne Lyndal Pulley R, DO  pantoprazole (PROTONIX) 40 MG tablet TAKE 1 TABLET(40 MG) BY MOUTH DAILY 04/04/17  Yes Ann Held, DO  thyroid (NP THYROID) 90 MG tablet Take 1 tablet by mouth daily. 05/21/15  Yes [provider]  Lurasidone HCl (LATUDA) 60 MG TABS Take 1 tablet (60 mg total) by mouth daily. 10/18/17   Ann Held, DO    Family History Family History  Problem Relation Age of Onset  . Coronary artery disease Mother   . Stroke Mother        TIA  . Uterine cancer Mother        BREAST  . Thyroid disease Mother        goiter  . Lung cancer Father   . Heart disease Father   . COPD Father   . Coronary artery disease Father   . Colon cancer Paternal Uncle        COLON; STOMACH  . Lung cancer Maternal Grandfather   . Rheum arthritis Maternal Aunt   . Supraventricular tachycardia Daughter        S/P ablation  . Thyroid disease Cousin     Social History Social History   Tobacco Use  . Smoking status: Never Smoker  . Smokeless tobacco: Never Used  Substance Use Topics  . Alcohol use: No    Comment: occasional use  . Drug use: No     Allergies   Amoxicillin; Atorvastatin; Fentanyl; Midazolam;  Oxycodone-aspirin; Sulfa antibiotics; Sulfasalazine; Sulfonamide derivatives; Hydrocodone; Lisinopril; Nalbuphine; Oxycodone-acetaminophen; Venlafaxine; and Verapamil   Review of Systems Review of Systems Ten systems reviewed and are negative for acute change, except as noted in the HPI.    Physical Exam Updated Vital Signs BP (!) 145/88 (BP Location: Left Arm)   Pulse 62   Temp (!) 97.5 F (36.4 C) (Axillary)   Resp 18   Ht 5\' 5"  (1.651 m)   Wt 82.1 kg (181 lb)   SpO2 99%   BMI 30.12 kg/m   Physical Exam  Constitutional: She is oriented to person, place, and time. She appears well-developed and well-nourished. No distress.  HENT:  Head: Normocephalic and atraumatic.  Eyes: Conjunctivae and EOM are normal. No scleral icterus.  Neck: Normal range of motion.  Cardiovascular: Normal rate, regular rhythm and intact distal pulses.  Pulmonary/Chest: Effort normal. No respiratory distress.  Musculoskeletal: Normal range of motion.  Neurological: She is alert and oriented to person, place, and time. She exhibits normal muscle tone. Coordination normal.  Skin: Skin is warm and dry. No rash noted. She is not diaphoretic. No erythema. No pallor.  Psychiatric: Thought content normal. Her mood appears anxious. Her speech is rapid and/or pressured. She is agitated (mild).  Nursing note and vitals reviewed.    ED Treatments / Results  Labs (all labs ordered are listed, but only abnormal results are displayed) Labs Reviewed  COMPREHENSIVE METABOLIC PANEL - Abnormal; Notable for the following components:      Result Value   Potassium 3.3 (*)    Creatinine, Ser 1.18 (*)    GFR calc non Af Amer 49 (*)    GFR calc Af Amer 57 (*)    All other components within normal limits  RAPID URINE DRUG SCREEN, HOSP PERFORMED - Abnormal; Notable for the following components:   Barbiturates   (*)    Value: Result not available. Reagent lot number recalled by manufacturer.   All other components  within normal limits  CBC WITH DIFFERENTIAL/PLATELET  ETHANOL    EKG None  Radiology No results found.  Procedures Procedures (including critical care time)  Medications Ordered in ED Medications  amLODipine (NORVASC) tablet 5 mg (has no administration in time range)  lurasidone (LATUDA) tablet 60 mg (has no administration in time range)  thyroid (ARMOUR) tablet 90 mg (has no administration in time range)  metoprolol tartrate (LOPRESSOR) tablet 25 mg (25 mg Oral Given 10/18/17 2358)  aspirin tablet 325 mg (325 mg Oral Given 10/19/17 0018)  pantoprazole (PROTONIX) EC tablet 40 mg (40 mg Oral Given 10/19/17 0018)     Initial Impression / Assessment and Plan / ED Course  I have reviewed the triage vital signs and the nursing notes.  Pertinent labs & imaging results that were available during my care of the patient were reviewed by me and considered in my medical decision making (see chart for details).     62 year old female presents to the emergency department for psychiatric evaluation.  She reports a history of bipolar disorder for which she was previously prescribed Latuda.  She has been off of this medication for approximately 2 years, but her symptoms have been worsening since changing jobs.  She has been evaluated by psychiatry to assess for inpatient stabilization.  Plan for psychiatric evaluation in the morning.  Disposition to be determined by oncoming ED provider.  Patient medically cleared.   Final Clinical Impressions(s) / ED Diagnoses   Final diagnoses:  Moderate episode of recurrent major depressive disorder [facility])    ED Discharge Orders    None       Antonietta Breach, PA-C 10/19/17 0358    Valarie Merino, MD 10/21/17 1152

## 2017-10-18 NOTE — Progress Notes (Signed)
Patient ID: Sarah Salazar, female    DOB: 1956/02/18  Age: 62 y.o. MRN: [id number]    Subjective:  Subjective  HPI Sarah Salazar presents for f/u -- she has not been here in 2 years.  She moved to [place] [place].  Her psych there took her off her meds for bipolar because he did not believe she was bipolar.  Pt would like to start back on her meds.  She said her sister and boyfriend fought and her sister said she (the pt) was never going back to [employer].  The pt wants to go back and state she does not want to stay here.  Here sister nags her and she wants to go back to her boyfriend.  Her sister was not in the room at this point.  She is upset because her sister is telling her how to cut and color her hair and what clothes to keep.  She is not suicidal but feels completely overwhelmed and if she can not start her meds she thinks she needs to be admitted. [place]  She was put on an aspirin and a statin but stopped the statin due to severe muscle pains.   She was told she also needed an endo referral due to thyroid problems.   Review of Systems  Constitutional: Negative for chills and fever.  HENT: Negative for congestion and hearing loss.   Eyes: Negative for discharge.  Respiratory: Negative for cough and shortness of breath.   Cardiovascular: Negative for chest pain, palpitations and leg swelling.  Gastrointestinal: Negative for abdominal pain, blood in stool, constipation, diarrhea, nausea and vomiting.  Genitourinary: Negative for dysuria, frequency, hematuria and urgency.  Musculoskeletal: Negative for back pain and myalgias.  Skin: Negative for rash.  Allergic/Immunologic: Negative for environmental allergies.  Neurological: Negative for dizziness, weakness and headaches.  Hematological: Does not bruise/bleed easily.  Psychiatric/Behavioral: Positive for decreased concentration and dysphoric mood. Negative for self-injury, sleep  disturbance and suicidal ideas. The patient is not nervous/anxious.     History Past Medical History:  Diagnosis Date  . Anxiety   . Arthritis    both thumbs  . Bipolar disorder ([place])   . Bulging lumbar disc    "L3-5" (08/01/2013)  . Chest pain    Emergency room April 23, 2012, troponin is normal, chest CT scan showed no pulmonary embolus  . Complication of anesthesia    "I'm allergic to versed and fentanyl"  . Cough    Chronic cough  . Depression   . Family history of anesthesia complication    "hard time waking daughter up post SVT ablation"  . GERD (gastroesophageal reflux disease)   . Hypertension   . Hypothyroidism    hypothyroidism  . IBS (irritable bowel syndrome)   . Personal history of colonic adenoma 06/28/2012  . Personal history of failed moderate sedation 06/20/2012  . Pneumonia    "couple times" (08/01/2013)  . Sleep apnea    does not use CPAP  . Stroke [facility])    tia    She has a past surgical history that includes Cholecystectomy; Dilation and curettage of uterus (X 3); Knee arthroscopy (Right, X 2); Wrist surgery (1979-~ 2010); laparoscopy (0177'L - 06/03/1990); Cystocele repair (01/13/2009); Esophagogastroduodenoscopy (egd) with esophageal dilation (2004; 2014); Colonoscopy (2002); Elbow surgery (Left, 2005); Breast lumpectomy (Left); Mastectomy, partial (Left); Wisdom tooth extraction; Carpal tunnel release (Left, 10/28/2016); and Ear Cyst Excision (Left, 10/28/2016).  Her family history includes COPD in her father; Colon cancer in her paternal uncle; Coronary artery disease in her father and mother; Heart disease in her father; Lung cancer in her father and maternal grandfather; Rheum arthritis in her maternal aunt; Stroke in her mother; Supraventricular tachycardia in her daughter; Thyroid disease in her cousin and mother; Uterine cancer in her mother.She reports that she has never smoked. She has never used smokeless tobacco. She reports that she does not drink  alcohol or use drugs.  Current Outpatient Medications on File Prior to Visit  Medication Sig Dispense Refill  . amLODipine (NORVASC) 5 MG tablet Take 1 tablet by mouth daily.    Marland Kitchen aspirin 325 MG tablet Take 1 tablet by mouth daily.  0  . fluticasone (FLONASE) 50 MCG/ACT nasal spray Place 2 sprays into both nostrils daily. (Patient taking differently: Place 2 sprays into both nostrils daily as needed for allergies. ) 16 g 1  . metoprolol tartrate (LOPRESSOR) 25 MG tablet 1 tab po twice daily 30 tablet 0  . pantoprazole (PROTONIX) 40 MG tablet TAKE 1 TABLET(40 MG) BY MOUTH DAILY 90 tablet 0  . thyroid (NP THYROID) 90 MG tablet Take 1 tablet by mouth daily.     No current facility-administered medications on file prior to visit.      Objective:  Objective  Physical Exam  Constitutional: She is oriented to person, place, and time. She appears well-developed and well-nourished.  HENT:  Head: Normocephalic and atraumatic.  Eyes: Conjunctivae and EOM are normal.  Neck: Normal range of motion. Neck supple. No JVD present. Carotid bruit is not present. No thyromegaly present.  Cardiovascular: Normal rate, regular rhythm and normal heart sounds.  No murmur heard. Pulmonary/Chest: Effort normal and breath sounds normal. No respiratory distress. She has no wheezes. She has no rales. She exhibits no tenderness.  Musculoskeletal: She exhibits no edema.  Neurological: She is alert and oriented to person, place, and time. No cranial nerve deficit or sensory deficit. She exhibits normal muscle tone. Coordination normal.  Psychiatric: Her speech is normal. Judgment normal. She is agitated. Cognition and memory are normal. She exhibits a depressed mood. She expresses no homicidal and no suicidal ideation. She expresses no suicidal plans and no homicidal plans.  Nursing note and vitals reviewed.  BP (!) 142/59 (BP Location: Left Arm, Cuff Size: Large)   Pulse 70   Temp 98.2 F (36.8 C) (Oral)   Resp 16    Ht 5\' 6"  (1.676 m)   Wt 181 lb 3.2 oz (82.2 kg)   SpO2 98%   BMI 29.25 kg/m  Wt Readings from Last 3 Encounters:  10/19/17 181 lb (82.1 kg)  10/18/17 181 lb 3.2 oz (82.2 kg)  10/28/16 190 lb (86.2 kg)     Lab Results  Component Value Date   WBC 8.0 10/18/2017   HGB 12.2 10/18/2017   HCT 37.0 10/18/2017   PLT 195 10/18/2017   GLUCOSE 97 10/18/2017   CHOL 183 03/18/2015   TRIG 133.0 03/18/2015   HDL 38.20 (L) 03/18/2015   LDLDIRECT 126.5 10/12/2006   LDLCALC 118 (H) 03/18/2015   ALT 26 10/18/2017   AST 24 10/18/2017   NA 142 10/18/2017   K 3.3 (L) 10/18/2017   CL 106 10/18/2017   CREATININE 1.18 (H) 10/18/2017   BUN 20 10/18/2017   CO2 28 10/18/2017   TSH 2.50 05/07/2016   INR 1.05 08/01/2013   HGBA1C 5.6 08/01/2013    No results found.  Assessment & Plan:  Plan  I have discontinued Carlye H. Brisky's traMADol, meclizine, promethazine, traMADol, and hyoscyamine. I am also having her maintain her fluticasone, metoprolol tartrate, pantoprazole, thyroid, amLODipine, and aspirin.  Meds ordered this encounter  Medications  . DISCONTD: Lurasidone HCl (LATUDA) 60 MG TABS    Sig: Take 1 tablet (60 mg total) by mouth daily.    Dispense:  30 tablet    Refill:  2    Problem List Items Addressed This Visit      Unprioritized   BIPOLAR DISORDER UNSPECIFIED    latuda refilled Pt has tried many other meds for bipolar and she was unable to tolerate any latuda she was able to tolerate and it  Helped. Pt understands if she thinks she is going to hurt herself to go to the ER        Hypothyroidism   Relevant Medications   thyroid (NP THYROID) 90 MG tablet   Other Relevant Orders   Ambulatory referral to Endocrinology    Other Visit Diagnoses    Bipolar depression ([place])    -  Primary   History of transient ischemic attack (TIA)       Relevant Orders   Ambulatory referral to Neurology    pt here for >50 min with >50%face to face discussing bipolar depression and  TIA Follow-up: Return in about 3 months (around 01/18/2018), or if symptoms worsen or fail to improve, for hypertension, hyperlipidemia, annual exam, fasting.  Ann Held, DO

## 2017-10-19 ENCOUNTER — Telehealth: Payer: Self-pay

## 2017-10-19 ENCOUNTER — Telehealth: Payer: Self-pay | Admitting: Family Medicine

## 2017-10-19 ENCOUNTER — Other Ambulatory Visit: Payer: Self-pay

## 2017-10-19 DIAGNOSIS — F3131 Bipolar disorder, current episode depressed, mild: Secondary | ICD-10-CM | POA: Diagnosis present

## 2017-10-19 LAB — RAPID URINE DRUG SCREEN, HOSP PERFORMED
Amphetamines: NOT DETECTED
Benzodiazepines: NOT DETECTED
Cocaine: NOT DETECTED
Opiates: NOT DETECTED
Tetrahydrocannabinol: NOT DETECTED

## 2017-10-19 LAB — COMPREHENSIVE METABOLIC PANEL
ALT: 26 U/L (ref 14–54)
AST: 24 U/L (ref 15–41)
Albumin: 4.1 g/dL (ref 3.5–5.0)
Alkaline Phosphatase: 79 U/L (ref 38–126)
Anion gap: 8 (ref 5–15)
BUN: 20 mg/dL (ref 6–20)
CO2: 28 mmol/L (ref 22–32)
Calcium: 9.2 mg/dL (ref 8.9–10.3)
Chloride: 106 mmol/L (ref 101–111)
Creatinine, Ser: 1.18 mg/dL — ABNORMAL HIGH (ref 0.44–1.00)
GFR calc Af Amer: 57 mL/min — ABNORMAL LOW (ref >60)
GFR calc non Af Amer: 49 mL/min — ABNORMAL LOW (ref >60)
Glucose, Bld: 97 mg/dL (ref 65–99)
Potassium: 3.3 mmol/L — ABNORMAL LOW (ref 3.5–5.1)
Sodium: 142 mmol/L (ref 135–145)
Total Bilirubin: 0.4 mg/dL (ref 0.3–1.2)
Total Protein: 7.4 g/dL (ref 6.5–8.1)

## 2017-10-19 LAB — ETHANOL: Alcohol, Ethyl (B): <10 mg/dL (ref <10)

## 2017-10-19 MED ORDER — POTASSIUM CHLORIDE CRYS ER 20 MEQ PO TBCR
40.0000 meq | EXTENDED_RELEASE_TABLET | Freq: Once | ORAL | Status: AC
  Administered 2017-10-19: 40 meq via ORAL
  Filled 2017-10-19: qty 2

## 2017-10-19 MED ORDER — ASPIRIN 325 MG PO TABS
325.0000 mg | ORAL_TABLET | Freq: Every day | ORAL | Status: DC
  Administered 2017-10-19: 325 mg via ORAL
  Filled 2017-10-19: qty 1

## 2017-10-19 MED ORDER — LURASIDONE HCL 40 MG PO TABS: 40.0000 mg | ORAL_TABLET | Freq: Every day | ORAL | Status: DC

## 2017-10-19 MED ORDER — PANTOPRAZOLE SODIUM 40 MG PO TBEC
40.0000 mg | DELAYED_RELEASE_TABLET | Freq: Every day | ORAL | Status: DC
  Administered 2017-10-19: 40 mg via ORAL
  Filled 2017-10-19: qty 1

## 2017-10-19 MED ORDER — LURASIDONE HCL 60 MG PO TABS: 1.0000 | ORAL_TABLET | Freq: Every day | ORAL | 2 refills | Status: AC

## 2017-10-19 NOTE — ED Notes (Signed)
Pt stated "I take my ASA, protonix & my 2nd dose of metoprolol @ night.   The doctor that wrote my Rx for [name] is not a psychiatrist and that's why I'm having to wait for it to be filled.  I was hopiing to be able to get it started here."
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
## 2017-10-19 NOTE — Telephone Encounter (Signed)
Copied from [place] [id number]. Topic: Quick Communication - See Telephone Encounter >> Oct 19, 2017  4:28 PM Cleaster Corin, NT wrote: CRM for notification. See Telephone encounter for: 10/19/17.  Pt. Calling to see if there can be another med.(Lamotrigine) Called in instead of Lurasidone HCl (LATUDA) 60 MG TABS [[id number]] (not able to get med.) pt. Would like for nurse to give her a call about med. Change

## 2017-10-19 NOTE — Assessment & Plan Note (Signed)
latuda refilled Pt has tried many other meds for bipolar and she was unable to tolerate any latuda she was able to tolerate and it  Helped. Pt understands if she thinks she is going to hurt herself to go to the ER

## 2017-10-19 NOTE — ED Notes (Signed)
Pt discharged home. Discharged instructions read to pt who verbalized understanding. All belongings returned to pt who signed for same. Denies SI/HI, is not delusional and not responding to internal stimuli. Escorted pt to the ED exit.    

## 2017-10-19 NOTE — Telephone Encounter (Signed)
Pt has tried and failed many and was on latuda prior to coming back to [place]---  She can tell you which ones she was on in [employer]

## 2017-10-19 NOTE — ED Notes (Signed)
Patient  denies SI/HI/AVH at this time. Plan of care discussed. Encouragement and support provided and safety maintain. Q 15 min safety checks in place and video monitoring.

## 2017-10-19 NOTE — Telephone Encounter (Signed)
Patient called for the status of the PA.  She stated she will try and find out the name of the other medications she was on and give a call back.

## 2017-10-19 NOTE — Telephone Encounter (Signed)
PA initiated via Covermymeds; KEY: [id number]. Awaiting determination.

## 2017-10-19 NOTE — Discharge Instructions (Addendum)
   Pt is to be discharged from [facility] with recommendation too follow up with their outpatient provider.This has been included in pt's discharge instructions. Pt's nurse has been notified.  Despina Hidden, MA, [place] Triage Specialist [phone]

## 2017-10-19 NOTE — ED Notes (Signed)
Patient has been calm and cooperative during this shift.

## 2017-10-19 NOTE — BHH Suicide Risk Assessment (Signed)
Suicide Risk Assessment  Discharge Assessment   [facility] Discharge Suicide Risk Assessment   Principal Problem: Bipolar affective disorder, currently depressed, mild [facility]) Discharge Diagnoses:  Patient Active Problem List   Diagnosis Date Noted  . Bipolar affective disorder, currently depressed, mild ([place]) [F31.31] 10/19/2017    Priority: High  . Diarrhea [R19.7] 07/24/2016  . 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 OSA (obstructive sleep apnea) [G47.33] 10/09/2014  . Vocal cord dysfunction [J38.3] 09/04/2014  . Syncope [R55] 08/01/2013  . Palpitations [R00.2] 05/11/2013  . Obesity (BMI 30-39.9) [E66.9] 04/11/2013  . Personal history of colonic adenoma [Z86.010] 06/28/2012  . Personal history of failed moderate sedation [Z92.83] 06/20/2012  . Xyphoidalgia [R07.89] 06/19/2012  . Ejection fraction [R94.30]   . HTN (hypertension) [I10] 05/04/2012  . IBS (irritable bowel syndrome) [K58.9]   . Anxiety [F41.9]   . Depression [F32.9]   . Hypothyroidism [E03.9]   . Migraine [G43.909]   . Chronic cough [R05] 09/10/2011  . Hallucinations [R44.3] 05/14/2010  . B12 DEFICIENCY [E53.8] 02/17/2010  . BIPOLAR DISORDER UNSPECIFIED [F31.9] 10/23/2009  . HYPOKALEMIA [E87.6] 05/20/2009  . ASTERIXIS [R27.9] 12/20/2006  . HYPERLIPIDEMIA NEC/NOS [E78.5] 10/27/2006  . FASTING HYPERGLYCEMIA [R79.89] 10/27/2006    Total Time spent with patient: 45 minutes  Musculoskeletal: Strength & Muscle Tone: within normal limits Gait & Station: normal Patient leans: N/A  Psychiatric Specialty Exam:   Blood pressure 126/73, pulse (!) 54, temperature 98 F (36.7 C), resp. rate 16, height 5\' 5"  (1.651 m), weight 82.1 kg (181 lb), SpO2 98 %.Body mass index is 30.12 kg/m.  General Appearance: Casual  Eye Contact::  Good  Speech:  Normal Rate409  Volume:  Normal  Mood:  Depressed and Irritable  Affect:  Congruent  Thought Process:  Coherent and Descriptions of Associations: Intact  Orientation:  Full (Time,  Place, and Person)  Thought Content:  WDL and Logical  Suicidal Thoughts:  No  Homicidal Thoughts:  No  Memory:  Immediate;   Good Recent;   Good Remote;   Good  Judgement:  Fair  Insight:  Fair  Psychomotor Activity:  Normal  Concentration:  Good  Recall:  Good  Fund of Knowledge:Good  Language: Good  Akathisia:  No  Handed:  Right  AIMS (if indicated):     Assets:  Leisure Time Physical Health Resilience Social Support  Sleep:     Cognition: WNL  ADL's:  Intact   Mental Status Per Nursing Assessment::   On Admission:   62 [place] she [place] get her Latuda, reports being irritable and depressed without it.  History of bipolar disorder, Latuda started without issues. No suicidal/homicidal ideations, hallucinations, or substance abuse issues.  Rx provided with outpatient resources provided, stable for discharge.  Demographic Factors:  Caucasian  Loss Factors: NA  Historical Factors: NA  Risk Reduction Factors:   Sense of responsibility to family, Living with another person, especially a relative and Positive social support  Continued Clinical Symptoms:  Depression, mild; anxiety  Cognitive Features That Contribute To Risk:  None    Suicide Risk:  Minimal: No identifiable suicidal ideation.  Patients presenting with no risk factors but with morbid ruminations; may be classified as minimal risk based on the severity of the depressive symptoms    Plan Of Care/Follow-up recommendations:  Activity:  as tolerated Diet:  heart healthy diet  Jameire Kouba, NP 10/19/2017, 11:00 AM

## 2017-10-19 NOTE — Telephone Encounter (Signed)
Copied from [place] [id number]. Topic: Quick Communication - See Telephone Encounter >> Oct 19, 2017  4:28 PM Cleaster Corin, NT wrote: CRM for notification. See Telephone encounter for: 10/19/17.  Pt. Calling to see if there can be another med.(Lamotrigine) Called in instead of Lurasidone HCl (LATUDA) 60 MG TABS [[id number]] (not able to get med.) pt. Would like for nurse to give her a call about med. Change

## 2017-10-19 NOTE — Telephone Encounter (Signed)
Duplicate telephone notes- see PA telephone note for Latuda.

## 2017-10-19 NOTE — Telephone Encounter (Signed)
PA denied. Medication is covered when two formulary alternatives have been tried and failed. Formulary alternatives: clozapine, olanzapine, quetiapine, paliperidone, risperidone, or ziprasidone. Pt has tried and failed: aripiprazole. Please advise.

## 2017-10-19 NOTE — BHH Counselor (Signed)
Per Buford Dresser, DO, this pt does not require psychiatric hospitalization at this time.  Pt is to be discharged from [facility] with recommendation too follow up with their outpatient provider.  This has been included in pt's discharge instructions.  Pt's nurse has been notified.   Despina Hidden, MA, [place] Triage Specialist [phone]

## 2017-10-20 ENCOUNTER — Telehealth: Payer: Self-pay | Admitting: *Deleted

## 2017-10-20 ENCOUNTER — Encounter: Payer: Self-pay | Admitting: *Deleted

## 2017-10-20 NOTE — Telephone Encounter (Signed)
Appeal faxed to insurance

## 2017-10-20 NOTE — Telephone Encounter (Signed)
Spoke w/ Pt- she has actually tried Risperdal and Abilify- we can appeal denial.

## 2017-10-20 NOTE — Telephone Encounter (Signed)
Received Medical records from [facility]; forwarded to provider/SLS 06/20

## 2017-10-20 NOTE — Telephone Encounter (Signed)
Appeal letter sent to ins.

## 2017-10-21 ENCOUNTER — Telehealth: Payer: Self-pay | Admitting: *Deleted

## 2017-10-21 ENCOUNTER — Telehealth: Payer: Self-pay

## 2017-10-21 DIAGNOSIS — F411 Generalized anxiety disorder: Secondary | ICD-10-CM | POA: Diagnosis not present

## 2017-10-21 NOTE — Telephone Encounter (Signed)
Received Medical records from [facility]; forwarded to provider/SLS 06/21

## 2017-10-21 NOTE — Telephone Encounter (Signed)
ED follow up appointment scheduled for patient with Dr. Carollee Herter.

## 2017-10-24 MED FILL — LATUDA 60 MG TABLET: 60 | 30 days supply | Qty: 30 | Fill #0

## 2017-10-24 NOTE — Telephone Encounter (Signed)
Called for peer to peer and medication has been a approved.

## 2017-10-24 NOTE — Telephone Encounter (Signed)
Authorized from 10/19/17-10/17/20  Ref Number [id number]

## 2017-10-25 ENCOUNTER — Ambulatory Visit: Payer: BLUE CROSS/BLUE SHIELD | Admitting: Family Medicine

## 2017-10-25 DIAGNOSIS — Z0289 Encounter for other administrative examinations: Secondary | ICD-10-CM

## 2017-10-27 DIAGNOSIS — F411 Generalized anxiety disorder: Secondary | ICD-10-CM | POA: Diagnosis not present

## 2017-10-28 DIAGNOSIS — F3112 Bipolar disorder, current episode manic without psychotic features, moderate: Secondary | ICD-10-CM | POA: Diagnosis not present

## 2017-10-31 ENCOUNTER — Encounter: Payer: Self-pay | Admitting: Neurology

## 2017-10-31 ENCOUNTER — Ambulatory Visit (INDEPENDENT_AMBULATORY_CARE_PROVIDER_SITE_OTHER): Payer: BLUE CROSS/BLUE SHIELD | Admitting: Neurology

## 2017-10-31 VITALS — BP 129/78 | HR 60 | Ht 64.0 in | Wt 180.0 lb

## 2017-10-31 DIAGNOSIS — R251 Tremor, unspecified: Secondary | ICD-10-CM

## 2017-10-31 DIAGNOSIS — R945 Abnormal results of liver function studies: Secondary | ICD-10-CM

## 2017-10-31 DIAGNOSIS — R7989 Other specified abnormal findings of blood chemistry: Secondary | ICD-10-CM

## 2017-10-31 DIAGNOSIS — G459 Transient cerebral ischemic attack, unspecified: Secondary | ICD-10-CM

## 2017-10-31 DIAGNOSIS — F3181 Bipolar II disorder: Secondary | ICD-10-CM | POA: Diagnosis not present

## 2017-10-31 NOTE — Progress Notes (Signed)
[facility]    Provider:  Dr Jaynee Eagles Referring Provider: Carollee Herter, Alferd Apa, * Primary Care Physician:  Carollee Herter, Alferd Apa, DO  CC:  TIA  HPI:  Sarah Salazar is a 62 y.o. female here as a referral from Dr. Carollee Herter for TIA.  She was evaluated October 18, 2017 by her primary care and at the emergency room.  Past medical history anxiety, bipolar disorder, bulging lumbar disc, chronic cough, depression, GERD, hypertension, hypothyroidism, sleep apnea not on CPAP, TIA(?).  She was at [facility] day, she felt dizzy per her boyfriend, she sat down on a step stool, she reached back and her arm felt heavy and numb, also the left side of her face around the eye was numb and also her thigh, she was trying to drag her foot, ambulance came and took her to the emergency room. She had weakness on the left and her left side of the face her eye was shut, she was taken to the ED. She had a CT scan and an MRI and echo cardiogram and entire workup, echocardiogram, labwork. Everything was normal per patient. She is under a lot of stress.She has a tremor on the right arm that "comes and goes" not worsening not at rest, she is symptom free at this time and symptoms resolved in the ambulance. No snoring at night, she was diagnose with sleep apnea in the past and is non compliant.   Reviewed notes, labs and imaging from outside physicians, which showed:  CT head 08/2013 showed No acute intracranial abnormalities including mass lesion or mass effect, hydrocephalus, extra-axial fluid collection, midline shift, hemorrhage, or acute infarction, large ischemic events (personally reviewed images)   Need to request Records from [facility].   Reviewed referring physician notes as well as emergency room notes.  Patient [place].  She was put on aspirin and a statin but stopped the statin due to severe muscle pains.  She also  needed an Endo referral due to thyroid problems.  She also discussed bipolar disorder and placing her on medications for dysphoric mood and decreased concentration.  Tramadol meclizine promethazine tramadol and hyoscyamine were discontinued.  She was kept on fluticasone, metoprolol, Protonix, thyroid, amlodipine and aspirin.  She was started on Latuda for her bipolar disorder.  Cbc normal and cmp  With creatinine 1.18 and elevated LFTS  Patient was seen in the emergency room letter that same day October 18, 2017, at the time she was struggling with bipolar disorder due to being taken off her medication, she was having cycling moods, she has had anxiety over the last 4 weeks, passive suicidal ideation.  She was having difficulty filling the Latuda she was given earlier by her primary care.  She requested psychiatric evaluation and admission for psychiatric stabilization.  She had a behavioral health evaluation in the emergency room.  She was discharged home.  Review of Systems: Patient complains of symptoms per HPI as well as the following symptoms: TIA, derpession, jpoint pain. Pertinent negatives and positives per HPI. All others negative.   Social History   Socioeconomic History  . Marital status: Divorced    Spouse name: Not on file  . Number of children: 1  . Years of education: Not on file  . Highest education level: Not on file  Occupational History    Employer: [employer]  . Financial resource strain:  Not on file  . Food insecurity:    Worry: Not on file    Inability: Not on file  . Transportation needs:    Medical: Not on file    Non-medical: Not on file  Tobacco Use  . Smoking status: Never Smoker  . Smokeless tobacco: Never Used  Substance and Sexual Activity  . Alcohol use: No    Comment: occasional use  . Drug use: No  . Sexual activity: Yes    Partners: Male    Birth control/protection: Post-menopausal  Lifestyle  . Physical activity:    Days per week:  Not on file    Minutes per session: Not on file  . Stress: Not on file  Relationships  . Social connections:    Talks on phone: Not on file    Gets together: Not on file    Attends religious service: Not on file    Active member of club or organization: Not on file    Attends meetings of clubs or organizations: Not on file    Relationship status: Not on file  . Intimate partner violence:    Fear of current or ex partner: Not on file    Emotionally abused: Not on file    Physically abused: Not on file    Forced sexual activity: Not on file  Other Topics Concern  . Not on file  Social History Narrative   Divorced, has boyfriend   1 daughter   Patient accounts at [employer]   1 caffeinated beverage/day             Family History  Problem Relation Age of Onset  . Coronary artery disease Mother   . Stroke Mother        TIA  . Uterine cancer Mother        BREAST  . Thyroid disease Mother        goiter  . Lung cancer Father   . Heart disease Father   . COPD Father   . Coronary artery disease Father   . Colon cancer Paternal Uncle        COLON; STOMACH  . Lung cancer Maternal Grandfather   . Rheum arthritis Maternal Aunt   . Supraventricular tachycardia Daughter        S/P ablation  . Thyroid disease Cousin     Past Medical History:  Diagnosis Date  . Anxiety   . Arthritis    both thumbs  . Bipolar disorder ([place])   . Bulging lumbar disc    "L3-5" (08/01/2013)  . Chest pain    Emergency room April 23, 2012, troponin is normal, chest CT scan showed no pulmonary embolus  . Complication of anesthesia    "I'm allergic to versed and fentanyl"  . Cough    Chronic cough  . Depression   . Family history of anesthesia complication    "hard time waking daughter up post SVT ablation"  . GERD (gastroesophageal reflux disease)   . Hypertension   . Hypothyroidism    hypothyroidism  . IBS (irritable bowel syndrome)   . Personal history of colonic adenoma 06/28/2012  .  Personal history of failed moderate sedation 06/20/2012  . Pneumonia    "couple times" (08/01/2013)  . Sleep apnea    does not use CPAP  . TIA (transient ischemic attack)    tia    Past Surgical History:  Procedure Laterality Date  . BREAST LUMPECTOMY Left    "1st breast OR"  . CARPAL  TUNNEL RELEASE Left 10/28/2016   Procedure: CARPAL TUNNEL RELEASE;  Surgeon: Daryll Brod, MD;  Location: [facility];  Service: Orthopedics;  Laterality: Left;  . CHOLECYSTECTOMY    . COLONOSCOPY  2002  . CYSTOCELE REPAIR  01/13/2009  . DILATION AND CURETTAGE OF UTERUS  X 3  . EAR CYST EXCISION Left 10/28/2016   Procedure: EXCISION GANGLION CYST LEFT DORSAL WRIST;  Surgeon: Daryll Brod, MD;  Location: [facility];  Service: Orthopedics;  Laterality: Left;  . ELBOW SURGERY Left 2005  . ESOPHAGOGASTRODUODENOSCOPY (EGD) WITH ESOPHAGEAL DILATION  2004; 2014  . KNEE ARTHROSCOPY Right X 2  . LAPAROSCOPY  1970's - 06/03/1990   "several; to evaluate dysfunctional menses & pelvic pain"  . MASTECTOMY, PARTIAL Left    "2nd breast OR", benign fibrocystic changes  . WISDOM TOOTH EXTRACTION     "all 4 at once"  . WRIST SURGERY  1979-~ 2010   X 4    Current Outpatient Medications  Medication Sig Dispense Refill  . amLODipine (NORVASC) 5 MG tablet Take 1 tablet by mouth daily.    Marland Kitchen aspirin 325 MG tablet Take 1 tablet by mouth daily.  0  . fluticasone (FLONASE) 50 MCG/ACT nasal spray Place 2 sprays into both nostrils daily. (Patient taking differently: Place 2 sprays into both nostrils daily as needed for allergies. ) 16 g 1  . Lurasidone HCl (LATUDA) 60 MG TABS Take 1 tablet (60 mg total) by mouth daily. 30 tablet 2  . metoprolol tartrate (LOPRESSOR) 25 MG tablet 1 tab po twice daily 30 tablet 0  . pantoprazole (PROTONIX) 40 MG tablet TAKE 1 TABLET(40 MG) BY MOUTH DAILY 90 tablet 0  . thyroid (NP THYROID) 90 MG tablet Take 1 tablet by mouth daily.     No current facility-administered  medications for this visit.     Allergies as of 10/31/2017 - Review Complete 10/31/2017  Allergen Reaction Noted  . Amoxicillin Rash   . Atorvastatin  10/15/2017  . Fentanyl Rash   . Midazolam Rash   . Oxycodone-aspirin Rash 12/20/2006  . Sulfa antibiotics Rash 07/23/2015  . Sulfasalazine Rash 12/20/2006  . Sulfonamide derivatives Rash 12/20/2006  . Hydrocodone Hives and Nausea And Vomiting   . Lisinopril Cough 10/18/2017  . Nalbuphine Nausea And Vomiting   . Oxycodone-acetaminophen Hives and Nausea And Vomiting 12/20/2006  . Venlafaxine Other (See Comments) 12/20/2006  . Verapamil Palpitations 04/17/2012    Vitals: BP 129/78   Pulse 60   Ht 5\' 4"  (1.626 m)   Wt 180 lb (81.6 kg)   BMI 30.90 kg/m  Last Weight:  Wt Readings from Last 1 Encounters:  10/31/17 180 lb (81.6 kg)   Last Height:   Ht Readings from Last 1 Encounters:  10/31/17 5\' 4"  (1.626 m)   Physical exam: Exam: Gen: tangential, difficult to redirect, conversant, well nourised, obese, well groomed                     CV: RRR, no MRG. No Carotid Bruits. No peripheral edema, warm, nontender Eyes: Conjunctivae clear without exudates or hemorrhage  Neuro: Detailed Neurologic Exam  Speech:    Speech is normal; fluent and spontaneous with normal comprehension.  Cognition:    The patient is oriented to person, place, and time;     recent and remote memory intact;     language fluent;     normal attention, concentration,     fund of knowledge Cranial Nerves:  The pupils are equal, round, and reactive to light. The fundi are normal and spontaneous venous pulsations are present. Visual fields are full to finger confrontation. Extraocular movements are intact. Trigeminal sensation is intact and the muscles of mastication are normal. The face is symmetric. The palate elevates in the midline. Hearing intact. Voice is normal. Shoulder shrug is normal. The tongue has normal motion without fasciculations.    Coordination:    Normal finger to nose and heel to shin. Normal rapid alternating movements.   Gait:    No shuffling, no ataxia  Motor Observation:    No asymmetry, no atrophy, and no involuntary movements noted. Tone:    Normal muscle tone.    Posture:    Posture is normal. normal erect    Strength:    Strength is V/V in the upper and lower limbs.      Sensation: intact to LT     Reflex Exam:  DTR's:    Deep tendon reflexes in the upper and lower extremities are normal bilaterally.   Toes:    The toes are downgoing bilaterally.   Clonus:    Clonus is absent.       Assessment/Plan:   62 y.o. female here as a referral from Dr. Carollee Herter for TIA.  She was evaluated October 18, 2017 by her primary care and at the emergency room.  Past medical history anxiety, bipolar disorder, bulging lumbar disc, chronic cough, depression, GERD, hypertension, hypothyroidism, sleep apnea not on CPAP, TIA(?).  She was at [facility] day and developed left arm and face weakness and sensory changes. Per patient she was hospitalized for 4 days, had through stroke evaluation. Was discharged on ASA and statin. In the setting of stress per patient.   - Need records from [facility] where she was admitted and diagnosed with TIA - Continue ASA and statin for stroke prevention, pcp should check cholesterol (she has eaten already today) - follow closely with pcp for management of vascular risk factors - likely needs psychiatry and therapy has bipolar and "lots of stress"  Also bipolar disorder - She follows with cardiology and pcp at [place], continue - CMP with creatinine 1.18 and elevated LFTS - needs follow up with pcp, discussed with patient to see pcp for this will recheck - untreated sleep apnea, discussed sequelae including increased risk of stroke, she declines repeat test and treatment  Orders Placed This Encounter  Procedures  . CBC  . Comprehensive metabolic panel   Cc: Dr.  Alberteen Sam, MD  [facility] [address] [place], [place] [address]  Phone [phone] Fax [phone]

## 2017-10-31 NOTE — Patient Instructions (Addendum)
Labs today Will request records from [facility]   Stroke Prevention Some medical conditions and behaviors are associated with a higher chance of having a stroke. You can help prevent a stroke by making nutrition, lifestyle, and other changes, including managing any medical conditions you may have. What nutrition changes can be made?  Eat healthy foods. You can do this by: ? Choosing foods high in fiber, such as fresh fruits and vegetables and whole grains. ? Eating at least 5 or more servings of fruits and vegetables a day. Try to fill half of your plate at each meal with fruits and vegetables. ? Choosing lean protein foods, such as lean cuts of meat, poultry without skin, fish, tofu, beans, and nuts. ? Eating low-fat dairy products. ? Avoiding foods that are high in salt (sodium). This can help lower blood pressure. ? Avoiding foods that have saturated fat, trans fat, and cholesterol. This can help prevent high cholesterol. ? Avoiding processed and premade foods.  Follow your health care provider's specific guidelines for losing weight, controlling high blood pressure (hypertension), lowering high cholesterol, and managing diabetes. These may include: ? Reducing your daily calorie intake. ? Limiting your daily sodium intake to 1,500 milligrams (mg). ? Using only healthy fats for cooking, such as olive oil, canola oil, or sunflower oil. ? Counting your daily carbohydrate intake. What lifestyle changes can be made?  Maintain a healthy weight. Talk to your health care provider about your ideal weight.  Get at least 30 minutes of moderate physical activity at least 5 days a week. Moderate activity includes brisk walking, biking, and swimming.  Do not use any products that contain nicotine or tobacco, such as cigarettes and e-cigarettes. If you need help quitting, ask your health care provider. It may also be helpful to avoid exposure to secondhand smoke.  Limit alcohol intake to no more  than 1 drink a day for nonpregnant women and 2 drinks a day for men. One drink equals 12 oz of beer, 5 oz of wine, or 1 oz of hard liquor.  Stop any illegal drug use.  Avoid taking birth control pills. Talk to your health care provider about the risks of taking birth control pills if: ? You are over 6 years old. ? You smoke. ? You get migraines. ? You have ever had a blood clot. What other changes can be made?  Manage your cholesterol levels. ? Eating a healthy diet is important for preventing high cholesterol. If cholesterol cannot be managed through diet alone, you may also need to take medicines. ? Take any prescribed medicines to control your cholesterol as told by your health care provider.  Manage your diabetes. ? Eating a healthy diet and exercising regularly are important parts of managing your blood sugar. If your blood sugar cannot be managed through diet and exercise, you may need to take medicines. ? Take any prescribed medicines to control your diabetes as told by your health care provider.  Control your hypertension. ? To reduce your risk of stroke, try to keep your blood pressure below 130/80. ? Eating a healthy diet and exercising regularly are an important part of controlling your blood pressure. If your blood pressure cannot be managed through diet and exercise, you may need to take medicines. ? Take any prescribed medicines to control hypertension as told by your health care provider. ? Ask your health care provider if you should monitor your blood pressure at home. ? Have your blood pressure checked every year, even  if your blood pressure is normal. Blood pressure increases with age and some medical conditions.  Get evaluated for sleep disorders (sleep apnea). Talk to your health care provider about getting a sleep evaluation if you snore a lot or have excessive sleepiness.  Take over-the-counter and prescription medicines only as told by your health care provider.  Aspirin or blood thinners (antiplatelets or anticoagulants) may be recommended to reduce your risk of forming blood clots that can lead to stroke.  Make sure that any other medical conditions you have, such as atrial fibrillation or atherosclerosis, are managed. What are the warning signs of a stroke? The warning signs of a stroke can be easily remembered as BEFAST.  B is for balance. Signs include: ? Dizziness. ? Loss of balance or coordination. ? Sudden trouble walking.  E is for eyes. Signs include: ? A sudden change in vision. ? Trouble seeing.  F is for face. Signs include: ? Sudden weakness or numbness of the face. ? The face or eyelid drooping to one side.  A is for arms. Signs include: ? Sudden weakness or numbness of the arm, usually on one side of the body.  S is for speech. Signs include: ? Trouble speaking (aphasia). ? Trouble understanding.  T is for time. ? These symptoms may represent a serious problem that is an emergency. Do not wait to see if the symptoms will go away. Get medical help right away. Call your local emergency services (911 in the [place] Do not drive yourself to the hospital.  Other signs of stroke may include: ? A sudden, severe headache with no known cause. ? Nausea or vomiting. ? Seizure.  Where to find more information: For more information, visit:  American Stroke Association: www.strokeassociation.org  National Stroke Association: www.stroke.org  Summary  You can prevent a stroke by eating healthy, exercising, not smoking, limiting alcohol intake, and managing any medical conditions you may have.  Do not use any products that contain nicotine or tobacco, such as cigarettes and e-cigarettes. If you need help quitting, ask your health care provider. It may also be helpful to avoid exposure to secondhand smoke.  Remember BEFAST for warning signs of stroke. Get help right away if you or a loved one has any of these signs. This  information is not intended to replace advice given to you by your health care provider. Make sure you discuss any questions you have with your health care provider. Document Released: 05/27/2004 Document Revised: 05/25/2016 Document Reviewed: 05/25/2016 Elsevier Interactive Patient Education  [employer].

## 2017-11-01 LAB — COMPREHENSIVE METABOLIC PANEL
ALT: 27 IU/L (ref 0–32)
AST: 21 IU/L (ref 0–40)
Albumin/Globulin Ratio: 1.6 (ref 1.2–2.2)
Albumin: 4.6 g/dL (ref 3.5–5.5)
Alkaline Phosphatase: 90 IU/L (ref 39–117)
BUN/Creatinine Ratio: 12 (ref 9–23)
BUN: 11 mg/dL (ref 6–24)
Bilirubin Total: 0.3 mg/dL (ref 0.0–1.2)
CO2: 27 mmol/L (ref 20–29)
Calcium: 9.6 mg/dL (ref 8.7–10.2)
Chloride: 104 mmol/L (ref 96–106)
Creatinine, Ser: 0.95 mg/dL (ref 0.57–1.00)
GFR calc Af Amer: 76 mL/min/{1.73_m2} (ref >59)
GFR calc non Af Amer: 66 mL/min/{1.73_m2} (ref >59)
Globulin, Total: 2.8 g/dL (ref 1.5–4.5)
Glucose: 86 mg/dL (ref 65–99)
Potassium: 4.3 mmol/L (ref 3.5–5.2)
Sodium: 143 mmol/L (ref 134–144)
Total Protein: 7.4 g/dL (ref 6.0–8.5)

## 2017-11-01 LAB — CBC
Hematocrit: 41.4 % (ref 34.0–46.6)
Hemoglobin: 13 g/dL (ref 11.1–15.9)
MCH: 30.4 pg (ref 26.6–33.0)
MCHC: 31.4 g/dL — ABNORMAL LOW (ref 31.5–35.7)
MCV: 97 fL (ref 79–97)
Platelets: 247 10*3/uL (ref 150–450)
RBC: 4.27 x10E6/uL (ref 3.77–5.28)
RDW: 14.6 % (ref 12.3–15.4)
WBC: 8.1 10*3/uL (ref 3.4–10.8)

## 2017-11-02 ENCOUNTER — Telehealth: Payer: Self-pay

## 2017-11-02 NOTE — Telephone Encounter (Signed)
Author received call from [facility] re: increased anxiety. Pt stated she had been prescribed alprazolam 1mg , but was only taking about "a pencil ground" worth, which she said was less then .25mg . Pt. States she feels like she's "going to come out of her skin" sometimes and cannot sleep well, even while taking the alprazolam that she has on hand. Alprazolam is currently not on medication list. Pt. Denies suicidal ideation. Earliest appointment for pt. set up with Mackie Pai, PA for 7/5 at 1020, and follow-up made with Dr. Etter Sjogren. Author encouraged pt. to go to ED or behavioral health hospital if symptoms become severe, and pt. Verbalized understanding.

## 2017-11-02 NOTE — Telephone Encounter (Signed)
Pt. states she had been prescribed alprazolam 1mg , but was only taking about "a pencil ground" worth, which she said was less then .25mg .

## 2017-11-03 NOTE — Telephone Encounter (Signed)
Reviewed and think best to see her on Friday and then write appropiate medication after interview and exam.

## 2017-11-04 ENCOUNTER — Encounter: Payer: Self-pay | Admitting: Medical

## 2017-11-04 ENCOUNTER — Telehealth: Payer: Self-pay | Admitting: *Deleted

## 2017-11-04 ENCOUNTER — Ambulatory Visit (INDEPENDENT_AMBULATORY_CARE_PROVIDER_SITE_OTHER): Payer: BLUE CROSS/BLUE SHIELD | Admitting: Medical

## 2017-11-04 VITALS — BP 136/72 | HR 55 | Temp 98.1°F | Resp 16 | Ht 64.0 in | Wt 182.0 lb

## 2017-11-04 DIAGNOSIS — F329 Major depressive disorder, single episode, unspecified: Secondary | ICD-10-CM

## 2017-11-04 DIAGNOSIS — F32A Depression, unspecified: Secondary | ICD-10-CM

## 2017-11-04 DIAGNOSIS — F419 Anxiety disorder, unspecified: Secondary | ICD-10-CM

## 2017-11-04 DIAGNOSIS — F319 Bipolar disorder, unspecified: Secondary | ICD-10-CM

## 2017-11-04 DIAGNOSIS — Z8673 Personal history of transient ischemic attack (TIA), and cerebral infarction without residual deficits: Secondary | ICD-10-CM

## 2017-11-04 MED ORDER — CYCLOBENZAPRINE HCL 5 MG PO TABS: 5.0000 mg | ORAL_TABLET | Freq: Three times a day (TID) | ORAL | 0 refills | Status: AC | PRN

## 2017-11-04 MED ORDER — ASPIRIN 325 MG PO TABS: 325.0000 mg | ORAL_TABLET | Freq: Every day | ORAL | 3 refills | Status: DC

## 2017-11-04 MED ORDER — ASPIRIN EC 325 MG PO TBEC: 325.0000 mg | DELAYED_RELEASE_TABLET | Freq: Every day | ORAL | 0 refills | Status: AC

## 2017-11-04 MED ORDER — ASPIRIN 325 MG PO TABS: 325.0000 mg | ORAL_TABLET | Freq: Every day | ORAL | 3 refills | Status: AC

## 2017-11-04 MED FILL — ASPIRIN EC 325 MG TABLET: 325 | 100 days supply | Qty: 100 | Fill #0

## 2017-11-04 MED FILL — CYCLOBENZAPRINE 5 MG TABLET: 5 | 2 days supply | Qty: 5 | Fill #0

## 2017-11-04 NOTE — Telephone Encounter (Signed)
Copied from [place] [id number]. Topic: General - Other >> Nov 02, 2017 10:16 AM Keene Breath wrote: Reason for CRM: Patient called to speak with doctor to discuss the side effects she is having from her medication Lurasidone HCl (LATUDA) 60 MG TABS.  She stated that she feels very anxious and has a burst of energy/jumpy feeling when she takes the medication.  Please advise.  CB# [phone].

## 2017-11-04 NOTE — Telephone Encounter (Signed)
I don't think I have seen her. She has appointment today. So would you call her and explain can disuss her anxiety and latuda when in.

## 2017-11-04 NOTE — Patient Instructions (Addendum)
For your history of bipolar depression and anxiety, I do want you to try splitting your Latuda tablet in half.  We will see if this controls mood and reduces  your perceived side effects diminished with the reduced dose.  You have some Xanax at home and would use this only as needed for severe anxiety or panic attack.  However presently I am giving you Flexeril for your tense back muscles.  Use Flexeril only at night.  Please do not use both Flexeril and Xanax together.  Follow-up with Dr. Etter Sjogren this coming Tuesday or as needed.

## 2017-11-04 NOTE — Progress Notes (Signed)
Subjective:    Patient ID: Sarah Salazar, female    DOB: 1955-08-22, 62 y.o.   MRN: [id number]  HPI  Pt in states she has been feeling severely tensed up just recently. Tension is felt mostly at night. Pt has history of bipolar and recently her mood decreased.  Pt over past 2 years had been off of psychiatric meds but then restarted latuda medication 2 weeks. She was feeling better but then she started to atrribute stiff back muscles to latuda. She stopped latuda after for past 2 days. She feels little less tension in back but still present. Mood still better. But she felt like feels like needs to move constantly.Karena Addison)  Before she got back on latuda just recently felt severe depression.  Pt recenlty had xanax to see if this helped with the above. She states she took about 1/8 of a 1 mg tablet. It helped her sleep.(She states that xanax is old script from Dr. Candis Schatz.  Pt is in process of trying to get new psychiatrist. Former psychiatrist retired.     Review of Systems  Constitutional: Negative for activity change and chills.  Respiratory: Negative for cough and chest tightness.   Cardiovascular: Negative for chest pain and palpitations.  Musculoskeletal: Positive for back pain.       Upper back muscle stiffness.parthoracic  Neurological: Negative for dizziness, syncope, weakness and light-headedness.  Hematological: Negative for adenopathy. Does not bruise/bleed easily.  Psychiatric/Behavioral: Positive for dysphoric mood. Negative for behavioral problems, confusion, sleep disturbance and suicidal ideas. The patient is nervous/anxious.     Past Medical History:  Diagnosis Date  . Anxiety   . Arthritis    both thumbs  . Bipolar disorder ([place])   . Bulging lumbar disc    "L3-5" (08/01/2013)  . Chest pain    Emergency room April 23, 2012, troponin is normal, chest CT scan showed no pulmonary embolus  . Complication of anesthesia    "I'm allergic to versed and  fentanyl"  . Cough    Chronic cough  . Depression   . Family history of anesthesia complication    "hard time waking daughter up post SVT ablation"  . GERD (gastroesophageal reflux disease)   . Hypertension   . Hypothyroidism    hypothyroidism  . IBS (irritable bowel syndrome)   . Personal history of colonic adenoma 06/28/2012  . Personal history of failed moderate sedation 06/20/2012  . Pneumonia    "couple times" (08/01/2013)  . Sleep apnea    does not use CPAP  . TIA (transient ischemic attack)    tia     Social History   Socioeconomic History  . Marital status: Divorced    Spouse name: Not on file  . Number of children: 1  . Years of education: Not on file  . Highest education level: Not on file  Occupational History    Employer: [employer]  . Financial resource strain: Not on file  . Food insecurity:    Worry: Not on file    Inability: Not on file  . Transportation needs:    Medical: Not on file    Non-medical: Not on file  Tobacco Use  . Smoking status: Never Smoker  . Smokeless tobacco: Never Used  Substance and Sexual Activity  . Alcohol use: No    Comment: occasional use  . Drug use: No  . Sexual activity: Yes    Partners: Male    Birth control/protection: Post-menopausal  Lifestyle  .  Physical activity:    Days per week: Not on file    Minutes per session: Not on file  . Stress: Not on file  Relationships  . Social connections:    Talks on phone: Not on file    Gets together: Not on file    Attends religious service: Not on file    Active member of club or organization: Not on file    Attends meetings of clubs or organizations: Not on file    Relationship status: Not on file  . Intimate partner violence:    Fear of current or ex partner: Not on file    Emotionally abused: Not on file    Physically abused: Not on file    Forced sexual activity: Not on file  Other Topics Concern  . Not on file  Social History Narrative   Divorced,  has boyfriend   1 daughter   Patient accounts at [employer]   1 caffeinated beverage/day             Past Surgical History:  Procedure Laterality Date  . BREAST LUMPECTOMY Left    "1st breast OR"  . CARPAL TUNNEL RELEASE Left 10/28/2016   Procedure: CARPAL TUNNEL RELEASE;  Surgeon: Daryll Brod, MD;  Location: [facility];  Service: Orthopedics;  Laterality: Left;  . CHOLECYSTECTOMY    . COLONOSCOPY  2002  . CYSTOCELE REPAIR  01/13/2009  . DILATION AND CURETTAGE OF UTERUS  X 3  . EAR CYST EXCISION Left 10/28/2016   Procedure: EXCISION GANGLION CYST LEFT DORSAL WRIST;  Surgeon: Daryll Brod, MD;  Location: [facility];  Service: Orthopedics;  Laterality: Left;  . ELBOW SURGERY Left 2005  . ESOPHAGOGASTRODUODENOSCOPY (EGD) WITH ESOPHAGEAL DILATION  2004; 2014  . KNEE ARTHROSCOPY Right X 2  . LAPAROSCOPY  1970's - 06/03/1990   "several; to evaluate dysfunctional menses & pelvic pain"  . MASTECTOMY, PARTIAL Left    "2nd breast OR", benign fibrocystic changes  . WISDOM TOOTH EXTRACTION     "all 4 at once"  . WRIST SURGERY  1979-~ 2010   X 4    Family History  Problem Relation Age of Onset  . Coronary artery disease Mother   . Stroke Mother        TIA  . Uterine cancer Mother        BREAST  . Thyroid disease Mother        goiter  . Lung cancer Father   . Heart disease Father   . COPD Father   . Coronary artery disease Father   . Colon cancer Paternal Uncle        COLON; STOMACH  . Lung cancer Maternal Grandfather   . Rheum arthritis Maternal Aunt   . Supraventricular tachycardia Daughter        S/P ablation  . Thyroid disease Cousin     Allergies  Allergen Reactions  . Amoxicillin Rash       . Atorvastatin     Other reaction(s): Other Weakness and muscle pain  . Fentanyl Rash    given with Versed  . Midazolam Rash    given with Fentanyl  . Oxycodone-Aspirin Rash  . Sulfa Antibiotics Rash  . Sulfasalazine Rash    rash  . Sulfonamide  Derivatives Rash    rash  . Hydrocodone Hives and Nausea And Vomiting  . Lisinopril Cough  . Nalbuphine Nausea And Vomiting  . Oxycodone-Acetaminophen Hives and Nausea And Vomiting  . Venlafaxine Other (  See Comments)    Patient says makes her crazy  . Verapamil Palpitations    Caused "pounding " in chest    Current Outpatient Medications on File Prior to Visit  Medication Sig Dispense Refill  . ALPRAZolam (XANAX) 1 MG tablet Take 1 mg by mouth at bedtime as needed for anxiety.    Marland Kitchen amLODipine (NORVASC) 5 MG tablet Take 1 tablet by mouth daily.    Marland Kitchen aspirin 325 MG tablet Take 1 tablet by mouth daily.  0  . fluticasone (FLONASE) 50 MCG/ACT nasal spray Place 2 sprays into both nostrils daily. (Patient taking differently: Place 2 sprays into both nostrils daily as needed for allergies. ) 16 g 1  . Lurasidone HCl (LATUDA) 60 MG TABS Take 1 tablet (60 mg total) by mouth daily. 30 tablet 2  . metoprolol tartrate (LOPRESSOR) 25 MG tablet 1 tab po twice daily 30 tablet 0  . pantoprazole (PROTONIX) 40 MG tablet TAKE 1 TABLET(40 MG) BY MOUTH DAILY 90 tablet 0  . thyroid (NP THYROID) 90 MG tablet Take 1 tablet by mouth daily.     No current facility-administered medications on file prior to visit.     BP 136/72   Pulse (!) 55   Temp 98.1 F (36.7 C) (Oral)   Resp 16   Ht 5\' 4"  (1.626 m)   Wt 182 lb (82.6 kg)   SpO2 99%   BMI 31.24 kg/m       Objective:   Physical Exam  General Mental Status- Alert. General Appearance- Not in acute distress.   Skin General: Color- Normal Color. Moisture- Normal Moisture.  Neck Carotid Arteries- Normal color. Moisture- Normal Moisture. No carotid bruits. No JVD.  Chest and Lung Exam Auscultation: Breath Sounds:-Normal.  Cardiovascular Auscultation:Rythm- Regular. Murmurs & Other Heart Sounds:Auscultation of the heart reveals- No Murmurs.  Abdomen Inspection:-Inspeection Normal. Palpation/Percussion:Note:No mass. Palpation and Percussion  of the abdomen reveal- Non Tender, Non Distended + BS, no rebound or guarding.   Neurologic Cranial Nerve exam:- CN III-XII intact(No nystagmus), symmetric smile. Finger to Nose:- Normal/Intact Strength:- 5/5 equal and symmetric strength both upper and lower extremities.  Back- no cva tenderness. Faint parapspinal tenderness t spine. But no mid tspine pain.     Assessment & Plan:  For your history of bipolar depression and anxiety, I do want you to try splitting your Latuda tablet in half.  We will see if this controls mood and reduces  your perceived side effects diminished with the reduced dose.  You have some Xanax at home and would use this only as needed for severe anxiety or panic attack.  However presently I am giving you Flexeril for your tense back muscles.  Use Flexeril only at night.  Please do not use both Flexeril and Xanax together.  Follow-up with Dr. Etter Sjogren this coming Tuesday or as needed.  Per [facility] pharmacist ok to split latuda tab.  Mackie Pai, PA-C

## 2017-11-04 NOTE — Telephone Encounter (Signed)
Patient seen in office today by Percell Miller.

## 2017-11-07 DIAGNOSIS — F3112 Bipolar disorder, current episode manic without psychotic features, moderate: Secondary | ICD-10-CM | POA: Diagnosis not present

## 2017-11-08 ENCOUNTER — Ambulatory Visit: Payer: Self-pay | Admitting: Family Medicine

## 2017-11-16 ENCOUNTER — Telehealth: Payer: Self-pay | Admitting: *Deleted

## 2017-11-16 NOTE — Telephone Encounter (Signed)
Received Medical records from [facility]; forwarded to provider/SLS 07/17

## 2017-11-17 DIAGNOSIS — F3112 Bipolar disorder, current episode manic without psychotic features, moderate: Secondary | ICD-10-CM | POA: Diagnosis not present

## 2017-11-21 DIAGNOSIS — F3112 Bipolar disorder, current episode manic without psychotic features, moderate: Secondary | ICD-10-CM | POA: Diagnosis not present

## 2017-11-22 ENCOUNTER — Other Ambulatory Visit: Payer: Self-pay | Admitting: Family Medicine

## 2017-11-22 DIAGNOSIS — I10 Essential (primary) hypertension: Secondary | ICD-10-CM

## 2017-11-22 NOTE — Telephone Encounter (Signed)
Copied from [place] [id number]. Topic: Quick Communication - See Telephone Encounter >> Nov 22, 2017 10:17 AM Mylinda Latina, NT wrote: CRM for notification. See Telephone encounter for: 11/22/17. Patient states that she seen Mackie Pai and they cut her dosage of her Lurasidone HCl (LATUDA) 60 MG TABS in half to where she talks 30 mg. Patient stats that it is working a little bit but she still feel anxious., She is wondering Dr. Etter Sjogren- Cheri Rous can write her another RX for Lurasidone HCl (LATUDA) 60 MG TABS but make it 20mg  to see how it goes.   [facility] [address] [facility] [employer]

## 2017-11-23 NOTE — Telephone Encounter (Signed)
I reviewed/got to this message late. Will defer to Dr. Etter Sjogren pcp. If by chance she is not in office let me know and will handle.

## 2017-11-23 NOTE — Telephone Encounter (Signed)
Pt calling in for an update on med states she will be heading out of town on Friday and she needs it by today or tomorrow before close.

## 2017-11-23 NOTE — Telephone Encounter (Signed)
Author phoned pt. To notify her that Dr. Etter Sjogren is out of the office today, but will return tomorrow and follow up on further reducing her latuda dose from 30mg  to 20mg . Pt states she still is feeling jittery at times, but the lowered dose from 60mg  to 30mg  ordered by Percell Miller has helped. "Other than the jittery feeling, it's been working great!". Routed to Dr. Etter Sjogren and Mackie Pai, PA to advise on lowering dose further.

## 2017-11-24 ENCOUNTER — Other Ambulatory Visit: Payer: Self-pay

## 2017-11-24 DIAGNOSIS — F3112 Bipolar disorder, current episode manic without psychotic features, moderate: Secondary | ICD-10-CM | POA: Diagnosis not present

## 2017-11-24 DIAGNOSIS — I10 Essential (primary) hypertension: Secondary | ICD-10-CM

## 2017-11-24 MED ORDER — METOPROLOL TARTRATE 25 MG PO TABS: ORAL_TABLET | ORAL | 0 refills | Status: AC

## 2017-11-24 MED ORDER — METOPROLOL TARTRATE 25 MG PO TABS: ORAL_TABLET | ORAL | 0 refills | Status: DC

## 2017-11-24 NOTE — Telephone Encounter (Signed)
I dont feel comfortable with this--- I really need her to see a psychiatrist

## 2017-11-24 NOTE — Telephone Encounter (Signed)
Author phoned pt. to relay Dr. Nonda Lou suggestion regarding seeing a psychiatrist to manage titrating her latuda. Pt. [place] already setup to see an internist there, and she "completely understands" Dr. Nonda Lou hesitation. Pt. Requesting refill of metoprolol at this time, however, as she misplaced her bottle. "I just need about 4 days worth [place]". Last ordered by Dr. Etter Sjogren 01/11/17, #30, 0RF. Order pended, left for Dr. Etter Sjogren to review.

## 2017-11-24 NOTE — Telephone Encounter (Signed)
Notified patient #[id number]  Metoprolol tablets had been sent to pharmacy. Needed me to change pharmacies to [facility] [address] [place].

## 2017-12-06 DIAGNOSIS — K219 Gastro-esophageal reflux disease without esophagitis: Secondary | ICD-10-CM | POA: Diagnosis not present

## 2017-12-06 DIAGNOSIS — G459 Transient cerebral ischemic attack, unspecified: Secondary | ICD-10-CM | POA: Diagnosis not present

## 2017-12-06 DIAGNOSIS — I10 Essential (primary) hypertension: Secondary | ICD-10-CM | POA: Diagnosis not present

## 2017-12-06 DIAGNOSIS — E785 Hyperlipidemia, unspecified: Secondary | ICD-10-CM | POA: Diagnosis not present

## 2017-12-08 DIAGNOSIS — F3112 Bipolar disorder, current episode manic without psychotic features, moderate: Secondary | ICD-10-CM | POA: Diagnosis not present

## 2017-12-12 DIAGNOSIS — F3112 Bipolar disorder, current episode manic without psychotic features, moderate: Secondary | ICD-10-CM | POA: Diagnosis not present

## 2017-12-23 ENCOUNTER — Encounter: Payer: Self-pay | Admitting: Endocrinology

## 2017-12-27 DIAGNOSIS — I10 Essential (primary) hypertension: Secondary | ICD-10-CM | POA: Diagnosis not present

## 2017-12-27 DIAGNOSIS — K219 Gastro-esophageal reflux disease without esophagitis: Secondary | ICD-10-CM | POA: Diagnosis not present

## 2017-12-27 DIAGNOSIS — E785 Hyperlipidemia, unspecified: Secondary | ICD-10-CM | POA: Diagnosis not present

## 2017-12-27 DIAGNOSIS — E038 Other specified hypothyroidism: Secondary | ICD-10-CM | POA: Diagnosis not present

## 2017-12-29 DIAGNOSIS — Z01419 Encounter for gynecological examination (general) (routine) without abnormal findings: Secondary | ICD-10-CM | POA: Diagnosis not present

## 2017-12-29 DIAGNOSIS — Z124 Encounter for screening for malignant neoplasm of cervix: Secondary | ICD-10-CM | POA: Diagnosis not present

## 2017-12-29 DIAGNOSIS — Z1231 Encounter for screening mammogram for malignant neoplasm of breast: Secondary | ICD-10-CM | POA: Diagnosis not present

## 2017-12-30 DIAGNOSIS — Z124 Encounter for screening for malignant neoplasm of cervix: Secondary | ICD-10-CM | POA: Diagnosis not present

## 2017-12-30 DIAGNOSIS — Z01419 Encounter for gynecological examination (general) (routine) without abnormal findings: Secondary | ICD-10-CM | POA: Diagnosis not present

## 2017-12-30 DIAGNOSIS — Z1231 Encounter for screening mammogram for malignant neoplasm of breast: Secondary | ICD-10-CM | POA: Diagnosis not present

## 2018-01-10 ENCOUNTER — Telehealth: Payer: Self-pay | Admitting: *Deleted

## 2018-01-10 NOTE — Telephone Encounter (Signed)
Received request for Medical records from [facility], forwarded to [place] for email/scan/SLS 09/10

## 2018-01-16 DIAGNOSIS — F3113 Bipolar disorder, current episode manic without psychotic features, severe: Secondary | ICD-10-CM | POA: Diagnosis not present

## 2018-01-18 DIAGNOSIS — F3113 Bipolar disorder, current episode manic without psychotic features, severe: Secondary | ICD-10-CM | POA: Diagnosis not present

## 2018-01-31 DIAGNOSIS — K219 Gastro-esophageal reflux disease without esophagitis: Secondary | ICD-10-CM | POA: Diagnosis not present

## 2018-01-31 DIAGNOSIS — F319 Bipolar disorder, unspecified: Secondary | ICD-10-CM | POA: Diagnosis not present

## 2018-01-31 DIAGNOSIS — E038 Other specified hypothyroidism: Secondary | ICD-10-CM | POA: Diagnosis not present

## 2018-01-31 DIAGNOSIS — I10 Essential (primary) hypertension: Secondary | ICD-10-CM | POA: Diagnosis not present

## 2018-01-31 DIAGNOSIS — E785 Hyperlipidemia, unspecified: Secondary | ICD-10-CM | POA: Diagnosis not present

## 2018-02-08 DIAGNOSIS — F3113 Bipolar disorder, current episode manic without psychotic features, severe: Secondary | ICD-10-CM | POA: Diagnosis not present

## 2018-02-13 DIAGNOSIS — F3113 Bipolar disorder, current episode manic without psychotic features, severe: Secondary | ICD-10-CM | POA: Diagnosis not present

## 2018-02-15 DIAGNOSIS — F3113 Bipolar disorder, current episode manic without psychotic features, severe: Secondary | ICD-10-CM | POA: Diagnosis not present

## 2018-03-08 DIAGNOSIS — F3113 Bipolar disorder, current episode manic without psychotic features, severe: Secondary | ICD-10-CM | POA: Diagnosis not present

## 2018-03-10 DIAGNOSIS — F3113 Bipolar disorder, current episode manic without psychotic features, severe: Secondary | ICD-10-CM | POA: Diagnosis not present

## 2018-03-14 DIAGNOSIS — I10 Essential (primary) hypertension: Secondary | ICD-10-CM | POA: Diagnosis not present

## 2018-03-14 DIAGNOSIS — Z1231 Encounter for screening mammogram for malignant neoplasm of breast: Secondary | ICD-10-CM | POA: Diagnosis not present

## 2018-03-14 DIAGNOSIS — G459 Transient cerebral ischemic attack, unspecified: Secondary | ICD-10-CM | POA: Diagnosis not present

## 2018-03-14 DIAGNOSIS — E785 Hyperlipidemia, unspecified: Secondary | ICD-10-CM | POA: Diagnosis not present

## 2018-03-14 DIAGNOSIS — K219 Gastro-esophageal reflux disease without esophagitis: Secondary | ICD-10-CM | POA: Diagnosis not present

## 2018-06-06 DIAGNOSIS — F3113 Bipolar disorder, current episode manic without psychotic features, severe: Secondary | ICD-10-CM | POA: Diagnosis not present

## 2018-06-21 DIAGNOSIS — F3113 Bipolar disorder, current episode manic without psychotic features, severe: Secondary | ICD-10-CM | POA: Diagnosis not present

## 2018-07-06 DIAGNOSIS — M25561 Pain in right knee: Secondary | ICD-10-CM | POA: Diagnosis not present

## 2018-08-28 DIAGNOSIS — F3113 Bipolar disorder, current episode manic without psychotic features, severe: Secondary | ICD-10-CM | POA: Diagnosis not present

## 2018-09-04 DIAGNOSIS — F3113 Bipolar disorder, current episode manic without psychotic features, severe: Secondary | ICD-10-CM | POA: Diagnosis not present

## 2018-09-06 DIAGNOSIS — F319 Bipolar disorder, unspecified: Secondary | ICD-10-CM | POA: Diagnosis not present

## 2018-09-06 DIAGNOSIS — E038 Other specified hypothyroidism: Secondary | ICD-10-CM | POA: Diagnosis not present

## 2018-09-06 DIAGNOSIS — I10 Essential (primary) hypertension: Secondary | ICD-10-CM | POA: Diagnosis not present

## 2018-09-06 DIAGNOSIS — E785 Hyperlipidemia, unspecified: Secondary | ICD-10-CM | POA: Diagnosis not present

## 2018-09-11 DIAGNOSIS — F3113 Bipolar disorder, current episode manic without psychotic features, severe: Secondary | ICD-10-CM | POA: Diagnosis not present

## 2018-09-13 DIAGNOSIS — I10 Essential (primary) hypertension: Secondary | ICD-10-CM | POA: Diagnosis not present

## 2018-09-13 DIAGNOSIS — G459 Transient cerebral ischemic attack, unspecified: Secondary | ICD-10-CM | POA: Diagnosis not present

## 2018-09-13 DIAGNOSIS — K219 Gastro-esophageal reflux disease without esophagitis: Secondary | ICD-10-CM | POA: Diagnosis not present

## 2018-09-13 DIAGNOSIS — M549 Dorsalgia, unspecified: Secondary | ICD-10-CM | POA: Diagnosis not present

## 2018-09-13 DIAGNOSIS — Z13820 Encounter for screening for osteoporosis: Secondary | ICD-10-CM | POA: Diagnosis not present

## 2018-09-13 DIAGNOSIS — F319 Bipolar disorder, unspecified: Secondary | ICD-10-CM | POA: Diagnosis not present

## 2018-09-21 DIAGNOSIS — F3113 Bipolar disorder, current episode manic without psychotic features, severe: Secondary | ICD-10-CM | POA: Diagnosis not present

## 2018-09-28 DIAGNOSIS — F3113 Bipolar disorder, current episode manic without psychotic features, severe: Secondary | ICD-10-CM | POA: Diagnosis not present

## 2018-10-12 DIAGNOSIS — F3113 Bipolar disorder, current episode manic without psychotic features, severe: Secondary | ICD-10-CM | POA: Diagnosis not present

## 2018-10-17 DIAGNOSIS — F3113 Bipolar disorder, current episode manic without psychotic features, severe: Secondary | ICD-10-CM | POA: Diagnosis not present

## 2018-10-24 DIAGNOSIS — F3113 Bipolar disorder, current episode manic without psychotic features, severe: Secondary | ICD-10-CM | POA: Diagnosis not present

## 2018-10-25 DIAGNOSIS — I10 Essential (primary) hypertension: Secondary | ICD-10-CM | POA: Diagnosis not present

## 2018-10-25 DIAGNOSIS — F319 Bipolar disorder, unspecified: Secondary | ICD-10-CM | POA: Diagnosis not present

## 2018-10-25 DIAGNOSIS — G459 Transient cerebral ischemic attack, unspecified: Secondary | ICD-10-CM | POA: Diagnosis not present

## 2018-10-25 DIAGNOSIS — E785 Hyperlipidemia, unspecified: Secondary | ICD-10-CM | POA: Diagnosis not present

## 2018-10-25 DIAGNOSIS — E038 Other specified hypothyroidism: Secondary | ICD-10-CM | POA: Diagnosis not present

## 2018-10-25 DIAGNOSIS — K219 Gastro-esophageal reflux disease without esophagitis: Secondary | ICD-10-CM | POA: Diagnosis not present

## 2018-10-31 DIAGNOSIS — F3113 Bipolar disorder, current episode manic without psychotic features, severe: Secondary | ICD-10-CM | POA: Diagnosis not present

## 2018-11-07 DIAGNOSIS — F3113 Bipolar disorder, current episode manic without psychotic features, severe: Secondary | ICD-10-CM | POA: Diagnosis not present

## 2018-11-14 DIAGNOSIS — F3113 Bipolar disorder, current episode manic without psychotic features, severe: Secondary | ICD-10-CM | POA: Diagnosis not present

## 2018-11-20 DIAGNOSIS — G459 Transient cerebral ischemic attack, unspecified: Secondary | ICD-10-CM | POA: Diagnosis not present

## 2018-11-20 DIAGNOSIS — I10 Essential (primary) hypertension: Secondary | ICD-10-CM | POA: Diagnosis not present

## 2018-11-20 DIAGNOSIS — F319 Bipolar disorder, unspecified: Secondary | ICD-10-CM | POA: Diagnosis not present

## 2018-11-20 DIAGNOSIS — K219 Gastro-esophageal reflux disease without esophagitis: Secondary | ICD-10-CM | POA: Diagnosis not present

## 2018-11-21 DIAGNOSIS — F3113 Bipolar disorder, current episode manic without psychotic features, severe: Secondary | ICD-10-CM | POA: Diagnosis not present

## 2018-11-22 DIAGNOSIS — M25561 Pain in right knee: Secondary | ICD-10-CM | POA: Diagnosis not present

## 2018-11-28 DIAGNOSIS — F3113 Bipolar disorder, current episode manic without psychotic features, severe: Secondary | ICD-10-CM | POA: Diagnosis not present

## 2018-12-05 DIAGNOSIS — F3113 Bipolar disorder, current episode manic without psychotic features, severe: Secondary | ICD-10-CM | POA: Diagnosis not present

## 2018-12-12 DIAGNOSIS — F3113 Bipolar disorder, current episode manic without psychotic features, severe: Secondary | ICD-10-CM | POA: Diagnosis not present

## 2018-12-13 DIAGNOSIS — R109 Unspecified abdominal pain: Secondary | ICD-10-CM | POA: Diagnosis not present

## 2018-12-13 DIAGNOSIS — K219 Gastro-esophageal reflux disease without esophagitis: Secondary | ICD-10-CM | POA: Diagnosis not present

## 2018-12-13 DIAGNOSIS — R1013 Epigastric pain: Secondary | ICD-10-CM | POA: Diagnosis not present

## 2018-12-13 DIAGNOSIS — R6881 Early satiety: Secondary | ICD-10-CM | POA: Diagnosis not present

## 2018-12-19 DIAGNOSIS — F3113 Bipolar disorder, current episode manic without psychotic features, severe: Secondary | ICD-10-CM | POA: Diagnosis not present

## 2018-12-26 DIAGNOSIS — F3113 Bipolar disorder, current episode manic without psychotic features, severe: Secondary | ICD-10-CM | POA: Diagnosis not present

## 2018-12-27 DIAGNOSIS — R1013 Epigastric pain: Secondary | ICD-10-CM | POA: Diagnosis not present

## 2019-01-02 DIAGNOSIS — M47816 Spondylosis without myelopathy or radiculopathy, lumbar region: Secondary | ICD-10-CM | POA: Diagnosis not present

## 2019-01-02 DIAGNOSIS — F3113 Bipolar disorder, current episode manic without psychotic features, severe: Secondary | ICD-10-CM | POA: Diagnosis not present

## 2019-01-02 DIAGNOSIS — M549 Dorsalgia, unspecified: Secondary | ICD-10-CM | POA: Diagnosis not present

## 2019-01-10 DIAGNOSIS — F3113 Bipolar disorder, current episode manic without psychotic features, severe: Secondary | ICD-10-CM | POA: Diagnosis not present

## 2019-01-12 DIAGNOSIS — R197 Diarrhea, unspecified: Secondary | ICD-10-CM | POA: Diagnosis not present

## 2019-01-12 DIAGNOSIS — R1013 Epigastric pain: Secondary | ICD-10-CM | POA: Diagnosis not present

## 2019-01-12 DIAGNOSIS — K219 Gastro-esophageal reflux disease without esophagitis: Secondary | ICD-10-CM | POA: Diagnosis not present

## 2019-01-12 DIAGNOSIS — R1084 Generalized abdominal pain: Secondary | ICD-10-CM | POA: Diagnosis not present

## 2019-01-15 DIAGNOSIS — A09 Infectious gastroenteritis and colitis, unspecified: Secondary | ICD-10-CM | POA: Diagnosis not present

## 2019-01-15 DIAGNOSIS — R197 Diarrhea, unspecified: Secondary | ICD-10-CM | POA: Diagnosis not present

## 2019-01-17 DIAGNOSIS — F3113 Bipolar disorder, current episode manic without psychotic features, severe: Secondary | ICD-10-CM | POA: Diagnosis not present

## 2019-01-19 DIAGNOSIS — R9341 Abnormal radiologic findings on diagnostic imaging of renal pelvis, ureter, or bladder: Secondary | ICD-10-CM | POA: Diagnosis not present

## 2019-01-19 DIAGNOSIS — K449 Diaphragmatic hernia without obstruction or gangrene: Secondary | ICD-10-CM | POA: Diagnosis not present

## 2019-01-19 DIAGNOSIS — R1084 Generalized abdominal pain: Secondary | ICD-10-CM | POA: Diagnosis not present

## 2019-01-19 DIAGNOSIS — R197 Diarrhea, unspecified: Secondary | ICD-10-CM | POA: Diagnosis not present

## 2019-01-19 DIAGNOSIS — R109 Unspecified abdominal pain: Secondary | ICD-10-CM | POA: Diagnosis not present

## 2019-02-06 DIAGNOSIS — F3113 Bipolar disorder, current episode manic without psychotic features, severe: Secondary | ICD-10-CM | POA: Diagnosis not present

## 2019-02-13 DIAGNOSIS — F3113 Bipolar disorder, current episode manic without psychotic features, severe: Secondary | ICD-10-CM | POA: Diagnosis not present

## 2019-02-20 DIAGNOSIS — E038 Other specified hypothyroidism: Secondary | ICD-10-CM | POA: Diagnosis not present

## 2019-02-20 DIAGNOSIS — E785 Hyperlipidemia, unspecified: Secondary | ICD-10-CM | POA: Diagnosis not present

## 2019-02-20 DIAGNOSIS — G459 Transient cerebral ischemic attack, unspecified: Secondary | ICD-10-CM | POA: Diagnosis not present

## 2019-02-20 DIAGNOSIS — F319 Bipolar disorder, unspecified: Secondary | ICD-10-CM | POA: Diagnosis not present

## 2019-02-20 DIAGNOSIS — I10 Essential (primary) hypertension: Secondary | ICD-10-CM | POA: Diagnosis not present

## 2019-02-20 DIAGNOSIS — R5382 Chronic fatigue, unspecified: Secondary | ICD-10-CM | POA: Diagnosis not present

## 2019-02-20 DIAGNOSIS — K219 Gastro-esophageal reflux disease without esophagitis: Secondary | ICD-10-CM | POA: Diagnosis not present

## 2019-02-20 DIAGNOSIS — R601 Generalized edema: Secondary | ICD-10-CM | POA: Diagnosis not present

## 2019-02-22 DIAGNOSIS — Z01419 Encounter for gynecological examination (general) (routine) without abnormal findings: Secondary | ICD-10-CM | POA: Diagnosis not present

## 2019-02-22 DIAGNOSIS — R5382 Chronic fatigue, unspecified: Secondary | ICD-10-CM | POA: Diagnosis not present

## 2019-02-22 DIAGNOSIS — Z1239 Encounter for other screening for malignant neoplasm of breast: Secondary | ICD-10-CM | POA: Diagnosis not present

## 2019-02-22 DIAGNOSIS — Z1159 Encounter for screening for other viral diseases: Secondary | ICD-10-CM | POA: Diagnosis not present

## 2019-02-27 DIAGNOSIS — N390 Urinary tract infection, site not specified: Secondary | ICD-10-CM | POA: Diagnosis not present

## 2019-02-27 DIAGNOSIS — R350 Frequency of micturition: Secondary | ICD-10-CM | POA: Diagnosis not present

## 2019-02-27 DIAGNOSIS — N3941 Urge incontinence: Secondary | ICD-10-CM | POA: Diagnosis not present

## 2019-03-08 DIAGNOSIS — Z20828 Contact with and (suspected) exposure to other viral communicable diseases: Secondary | ICD-10-CM | POA: Diagnosis not present

## 2019-03-08 DIAGNOSIS — R1013 Epigastric pain: Secondary | ICD-10-CM | POA: Diagnosis not present

## 2019-03-08 DIAGNOSIS — K219 Gastro-esophageal reflux disease without esophagitis: Secondary | ICD-10-CM | POA: Diagnosis not present

## 2019-03-08 DIAGNOSIS — Z01818 Encounter for other preprocedural examination: Secondary | ICD-10-CM | POA: Diagnosis not present

## 2019-03-08 DIAGNOSIS — R197 Diarrhea, unspecified: Secondary | ICD-10-CM | POA: Diagnosis not present

## 2019-03-08 DIAGNOSIS — Z01812 Encounter for preprocedural laboratory examination: Secondary | ICD-10-CM | POA: Diagnosis not present

## 2019-03-22 DIAGNOSIS — G459 Transient cerebral ischemic attack, unspecified: Secondary | ICD-10-CM | POA: Diagnosis not present

## 2019-03-22 DIAGNOSIS — F319 Bipolar disorder, unspecified: Secondary | ICD-10-CM | POA: Diagnosis not present

## 2019-03-22 DIAGNOSIS — K219 Gastro-esophageal reflux disease without esophagitis: Secondary | ICD-10-CM | POA: Diagnosis not present

## 2019-03-22 DIAGNOSIS — I10 Essential (primary) hypertension: Secondary | ICD-10-CM | POA: Diagnosis not present

## 2019-03-27 DIAGNOSIS — F319 Bipolar disorder, unspecified: Secondary | ICD-10-CM | POA: Diagnosis not present

## 2019-04-03 DIAGNOSIS — N390 Urinary tract infection, site not specified: Secondary | ICD-10-CM | POA: Diagnosis not present

## 2019-04-05 DIAGNOSIS — F319 Bipolar disorder, unspecified: Secondary | ICD-10-CM | POA: Diagnosis not present

## 2019-04-17 DIAGNOSIS — F319 Bipolar disorder, unspecified: Secondary | ICD-10-CM | POA: Diagnosis not present

## 2019-04-20 DIAGNOSIS — Z01818 Encounter for other preprocedural examination: Secondary | ICD-10-CM | POA: Diagnosis not present

## 2019-04-23 DIAGNOSIS — Z885 Allergy status to narcotic agent status: Secondary | ICD-10-CM | POA: Diagnosis not present

## 2019-04-23 DIAGNOSIS — Z86010 Personal history of colonic polyps: Secondary | ICD-10-CM | POA: Diagnosis not present

## 2019-04-23 DIAGNOSIS — Z880 Allergy status to penicillin: Secondary | ICD-10-CM | POA: Diagnosis not present

## 2019-04-23 DIAGNOSIS — Z888 Allergy status to other drugs, medicaments and biological substances status: Secondary | ICD-10-CM | POA: Diagnosis not present

## 2019-04-23 DIAGNOSIS — R109 Unspecified abdominal pain: Secondary | ICD-10-CM | POA: Diagnosis not present

## 2019-04-23 DIAGNOSIS — K219 Gastro-esophageal reflux disease without esophagitis: Secondary | ICD-10-CM | POA: Diagnosis not present

## 2019-04-23 DIAGNOSIS — Z9049 Acquired absence of other specified parts of digestive tract: Secondary | ICD-10-CM | POA: Diagnosis not present

## 2019-04-23 DIAGNOSIS — R857 Abnormal histological findings in specimens from digestive organs and abdominal cavity: Secondary | ICD-10-CM | POA: Diagnosis not present

## 2019-04-23 DIAGNOSIS — F319 Bipolar disorder, unspecified: Secondary | ICD-10-CM | POA: Diagnosis not present

## 2019-04-23 DIAGNOSIS — R6881 Early satiety: Secondary | ICD-10-CM | POA: Diagnosis not present

## 2019-04-23 DIAGNOSIS — Z8673 Personal history of transient ischemic attack (TIA), and cerebral infarction without residual deficits: Secondary | ICD-10-CM | POA: Diagnosis not present

## 2019-04-23 DIAGNOSIS — Z882 Allergy status to sulfonamides status: Secondary | ICD-10-CM | POA: Diagnosis not present

## 2019-04-23 DIAGNOSIS — K2100 Gastro-esophageal reflux disease with esophagitis, without bleeding: Secondary | ICD-10-CM | POA: Diagnosis not present

## 2019-04-23 DIAGNOSIS — R197 Diarrhea, unspecified: Secondary | ICD-10-CM | POA: Diagnosis not present

## 2019-04-23 DIAGNOSIS — I10 Essential (primary) hypertension: Secondary | ICD-10-CM | POA: Diagnosis not present

## 2019-04-23 DIAGNOSIS — E785 Hyperlipidemia, unspecified: Secondary | ICD-10-CM | POA: Diagnosis not present

## 2019-04-23 DIAGNOSIS — E669 Obesity, unspecified: Secondary | ICD-10-CM | POA: Diagnosis not present

## 2019-04-23 DIAGNOSIS — K449 Diaphragmatic hernia without obstruction or gangrene: Secondary | ICD-10-CM | POA: Diagnosis not present

## 2019-04-23 DIAGNOSIS — E039 Hypothyroidism, unspecified: Secondary | ICD-10-CM | POA: Diagnosis not present

## 2019-05-11 DIAGNOSIS — Z03818 Encounter for observation for suspected exposure to other biological agents ruled out: Secondary | ICD-10-CM | POA: Diagnosis not present

## 2019-05-11 DIAGNOSIS — R05 Cough: Secondary | ICD-10-CM | POA: Diagnosis not present

## 2019-05-11 DIAGNOSIS — I10 Essential (primary) hypertension: Secondary | ICD-10-CM | POA: Diagnosis not present

## 2019-05-11 DIAGNOSIS — E785 Hyperlipidemia, unspecified: Secondary | ICD-10-CM | POA: Diagnosis not present

## 2019-05-11 DIAGNOSIS — E038 Other specified hypothyroidism: Secondary | ICD-10-CM | POA: Diagnosis not present

## 2019-05-11 DIAGNOSIS — R509 Fever, unspecified: Secondary | ICD-10-CM | POA: Diagnosis not present

## 2019-05-11 DIAGNOSIS — F319 Bipolar disorder, unspecified: Secondary | ICD-10-CM | POA: Diagnosis not present

## 2019-05-11 DIAGNOSIS — G459 Transient cerebral ischemic attack, unspecified: Secondary | ICD-10-CM | POA: Diagnosis not present

## 2019-05-11 DIAGNOSIS — R0981 Nasal congestion: Secondary | ICD-10-CM | POA: Diagnosis not present

## 2019-05-15 DIAGNOSIS — I10 Essential (primary) hypertension: Secondary | ICD-10-CM | POA: Diagnosis not present

## 2019-05-15 DIAGNOSIS — E038 Other specified hypothyroidism: Secondary | ICD-10-CM | POA: Diagnosis not present

## 2019-05-15 DIAGNOSIS — K219 Gastro-esophageal reflux disease without esophagitis: Secondary | ICD-10-CM | POA: Diagnosis not present

## 2019-05-15 DIAGNOSIS — E785 Hyperlipidemia, unspecified: Secondary | ICD-10-CM | POA: Diagnosis not present

## 2019-05-21 DIAGNOSIS — Z885 Allergy status to narcotic agent status: Secondary | ICD-10-CM | POA: Diagnosis not present

## 2019-05-21 DIAGNOSIS — Z8673 Personal history of transient ischemic attack (TIA), and cerebral infarction without residual deficits: Secondary | ICD-10-CM | POA: Diagnosis not present

## 2019-05-21 DIAGNOSIS — J069 Acute upper respiratory infection, unspecified: Secondary | ICD-10-CM | POA: Diagnosis not present

## 2019-05-21 DIAGNOSIS — E039 Hypothyroidism, unspecified: Secondary | ICD-10-CM | POA: Diagnosis not present

## 2019-05-21 DIAGNOSIS — Z888 Allergy status to other drugs, medicaments and biological substances status: Secondary | ICD-10-CM | POA: Diagnosis not present

## 2019-05-21 DIAGNOSIS — Z7982 Long term (current) use of aspirin: Secondary | ICD-10-CM | POA: Diagnosis not present

## 2019-05-21 DIAGNOSIS — B9789 Other viral agents as the cause of diseases classified elsewhere: Secondary | ICD-10-CM | POA: Diagnosis not present

## 2019-05-21 DIAGNOSIS — Z880 Allergy status to penicillin: Secondary | ICD-10-CM | POA: Diagnosis not present

## 2019-05-21 DIAGNOSIS — Z20822 Contact with and (suspected) exposure to covid-19: Secondary | ICD-10-CM | POA: Diagnosis not present

## 2019-05-21 DIAGNOSIS — I10 Essential (primary) hypertension: Secondary | ICD-10-CM | POA: Diagnosis not present

## 2019-05-21 DIAGNOSIS — K219 Gastro-esophageal reflux disease without esophagitis: Secondary | ICD-10-CM | POA: Diagnosis not present

## 2019-05-21 DIAGNOSIS — Z79899 Other long term (current) drug therapy: Secondary | ICD-10-CM | POA: Diagnosis not present

## 2019-05-21 DIAGNOSIS — R05 Cough: Secondary | ICD-10-CM | POA: Diagnosis not present

## 2019-05-21 DIAGNOSIS — F329 Major depressive disorder, single episode, unspecified: Secondary | ICD-10-CM | POA: Diagnosis not present

## 2019-05-21 DIAGNOSIS — E785 Hyperlipidemia, unspecified: Secondary | ICD-10-CM | POA: Diagnosis not present

## 2019-05-21 DIAGNOSIS — Z882 Allergy status to sulfonamides status: Secondary | ICD-10-CM | POA: Diagnosis not present

## 2019-06-21 DIAGNOSIS — F319 Bipolar disorder, unspecified: Secondary | ICD-10-CM | POA: Diagnosis not present

## 2019-07-17 DIAGNOSIS — F319 Bipolar disorder, unspecified: Secondary | ICD-10-CM | POA: Diagnosis not present

## 2019-07-24 DIAGNOSIS — F319 Bipolar disorder, unspecified: Secondary | ICD-10-CM | POA: Diagnosis not present

## 2019-07-30 DIAGNOSIS — F319 Bipolar disorder, unspecified: Secondary | ICD-10-CM | POA: Diagnosis not present

## 2019-08-14 DIAGNOSIS — Z8673 Personal history of transient ischemic attack (TIA), and cerebral infarction without residual deficits: Secondary | ICD-10-CM | POA: Diagnosis not present

## 2019-08-14 DIAGNOSIS — Z20822 Contact with and (suspected) exposure to covid-19: Secondary | ICD-10-CM | POA: Diagnosis not present

## 2019-08-14 DIAGNOSIS — E039 Hypothyroidism, unspecified: Secondary | ICD-10-CM | POA: Diagnosis not present

## 2019-08-14 DIAGNOSIS — M25571 Pain in right ankle and joints of right foot: Secondary | ICD-10-CM | POA: Diagnosis not present

## 2019-08-14 DIAGNOSIS — F329 Major depressive disorder, single episode, unspecified: Secondary | ICD-10-CM | POA: Diagnosis not present

## 2019-08-14 DIAGNOSIS — Z79899 Other long term (current) drug therapy: Secondary | ICD-10-CM | POA: Diagnosis not present

## 2019-08-14 DIAGNOSIS — U071 COVID-19: Secondary | ICD-10-CM | POA: Diagnosis not present

## 2019-08-14 DIAGNOSIS — R05 Cough: Secondary | ICD-10-CM | POA: Diagnosis not present

## 2019-08-14 DIAGNOSIS — Z880 Allergy status to penicillin: Secondary | ICD-10-CM | POA: Diagnosis not present

## 2019-08-14 DIAGNOSIS — Z888 Allergy status to other drugs, medicaments and biological substances status: Secondary | ICD-10-CM | POA: Diagnosis not present

## 2019-08-14 DIAGNOSIS — I10 Essential (primary) hypertension: Secondary | ICD-10-CM | POA: Diagnosis not present

## 2019-08-14 DIAGNOSIS — Z791 Long term (current) use of non-steroidal anti-inflammatories (NSAID): Secondary | ICD-10-CM | POA: Diagnosis not present

## 2019-08-14 DIAGNOSIS — R509 Fever, unspecified: Secondary | ICD-10-CM | POA: Diagnosis not present

## 2019-08-14 DIAGNOSIS — Z882 Allergy status to sulfonamides status: Secondary | ICD-10-CM | POA: Diagnosis not present

## 2019-08-14 DIAGNOSIS — K219 Gastro-esophageal reflux disease without esophagitis: Secondary | ICD-10-CM | POA: Diagnosis not present

## 2019-08-14 DIAGNOSIS — Z885 Allergy status to narcotic agent status: Secondary | ICD-10-CM | POA: Diagnosis not present

## 2019-08-14 DIAGNOSIS — Z23 Encounter for immunization: Secondary | ICD-10-CM | POA: Diagnosis not present

## 2019-08-22 DIAGNOSIS — E876 Hypokalemia: Secondary | ICD-10-CM | POA: Diagnosis not present

## 2019-08-22 DIAGNOSIS — E785 Hyperlipidemia, unspecified: Secondary | ICD-10-CM | POA: Diagnosis not present

## 2019-08-22 DIAGNOSIS — R079 Chest pain, unspecified: Secondary | ICD-10-CM | POA: Diagnosis not present

## 2019-08-22 DIAGNOSIS — R0789 Other chest pain: Secondary | ICD-10-CM | POA: Diagnosis not present

## 2019-08-22 DIAGNOSIS — Z886 Allergy status to analgesic agent status: Secondary | ICD-10-CM | POA: Diagnosis not present

## 2019-08-22 DIAGNOSIS — Z881 Allergy status to other antibiotic agents status: Secondary | ICD-10-CM | POA: Diagnosis not present

## 2019-08-22 DIAGNOSIS — I10 Essential (primary) hypertension: Secondary | ICD-10-CM | POA: Diagnosis not present

## 2019-08-22 DIAGNOSIS — K219 Gastro-esophageal reflux disease without esophagitis: Secondary | ICD-10-CM | POA: Diagnosis not present

## 2019-08-22 DIAGNOSIS — R509 Fever, unspecified: Secondary | ICD-10-CM | POA: Diagnosis not present

## 2019-08-22 DIAGNOSIS — R0602 Shortness of breath: Secondary | ICD-10-CM | POA: Diagnosis not present

## 2019-08-22 DIAGNOSIS — Z79899 Other long term (current) drug therapy: Secondary | ICD-10-CM | POA: Diagnosis not present

## 2019-08-22 DIAGNOSIS — R05 Cough: Secondary | ICD-10-CM | POA: Diagnosis not present

## 2019-08-22 DIAGNOSIS — Z888 Allergy status to other drugs, medicaments and biological substances status: Secondary | ICD-10-CM | POA: Diagnosis not present

## 2019-08-24 DIAGNOSIS — U071 COVID-19: Secondary | ICD-10-CM | POA: Diagnosis not present

## 2019-08-24 DIAGNOSIS — F319 Bipolar disorder, unspecified: Secondary | ICD-10-CM | POA: Diagnosis not present

## 2019-08-24 DIAGNOSIS — R509 Fever, unspecified: Secondary | ICD-10-CM | POA: Diagnosis not present

## 2019-08-27 DIAGNOSIS — U071 COVID-19: Secondary | ICD-10-CM | POA: Diagnosis not present

## 2019-08-30 DIAGNOSIS — U071 COVID-19: Secondary | ICD-10-CM | POA: Diagnosis not present

## 2019-08-31 DIAGNOSIS — U071 COVID-19: Secondary | ICD-10-CM | POA: Diagnosis not present

## 2019-08-31 DIAGNOSIS — R509 Fever, unspecified: Secondary | ICD-10-CM | POA: Diagnosis not present

## 2019-09-06 DIAGNOSIS — I10 Essential (primary) hypertension: Secondary | ICD-10-CM | POA: Diagnosis not present

## 2019-09-06 DIAGNOSIS — K219 Gastro-esophageal reflux disease without esophagitis: Secondary | ICD-10-CM | POA: Diagnosis not present

## 2019-09-06 DIAGNOSIS — F319 Bipolar disorder, unspecified: Secondary | ICD-10-CM | POA: Diagnosis not present

## 2019-09-06 DIAGNOSIS — R7989 Other specified abnormal findings of blood chemistry: Secondary | ICD-10-CM | POA: Diagnosis not present

## 2019-09-06 DIAGNOSIS — Z888 Allergy status to other drugs, medicaments and biological substances status: Secondary | ICD-10-CM | POA: Diagnosis not present

## 2019-09-06 DIAGNOSIS — Z882 Allergy status to sulfonamides status: Secondary | ICD-10-CM | POA: Diagnosis not present

## 2019-09-06 DIAGNOSIS — E785 Hyperlipidemia, unspecified: Secondary | ICD-10-CM | POA: Diagnosis not present

## 2019-09-06 DIAGNOSIS — R110 Nausea: Secondary | ICD-10-CM | POA: Diagnosis not present

## 2019-09-06 DIAGNOSIS — J019 Acute sinusitis, unspecified: Secondary | ICD-10-CM | POA: Diagnosis not present

## 2019-09-06 DIAGNOSIS — Z8673 Personal history of transient ischemic attack (TIA), and cerebral infarction without residual deficits: Secondary | ICD-10-CM | POA: Diagnosis not present

## 2019-09-06 DIAGNOSIS — Z881 Allergy status to other antibiotic agents status: Secondary | ICD-10-CM | POA: Diagnosis not present

## 2019-09-06 DIAGNOSIS — J329 Chronic sinusitis, unspecified: Secondary | ICD-10-CM | POA: Diagnosis not present

## 2019-09-06 DIAGNOSIS — R509 Fever, unspecified: Secondary | ICD-10-CM | POA: Diagnosis not present

## 2019-09-07 DIAGNOSIS — R509 Fever, unspecified: Secondary | ICD-10-CM | POA: Diagnosis not present

## 2019-09-14 DIAGNOSIS — F319 Bipolar disorder, unspecified: Secondary | ICD-10-CM | POA: Diagnosis not present

## 2019-10-12 DIAGNOSIS — F319 Bipolar disorder, unspecified: Secondary | ICD-10-CM | POA: Diagnosis not present
